# Patient Record
Sex: Female | Born: 1980 | Race: White | Hispanic: No | Smoking: Former smoker
Health system: Southern US, Community
[De-identification: ages and names within clinical notes are randomized; demographics above are authoritative.]

## PROBLEM LIST (undated history)

## (undated) DIAGNOSIS — B20 Human immunodeficiency virus [HIV] disease: Secondary | ICD-10-CM

## (undated) DIAGNOSIS — N19 Unspecified kidney failure: Secondary | ICD-10-CM

## (undated) DIAGNOSIS — N049 Nephrotic syndrome with unspecified morphologic changes: Secondary | ICD-10-CM

## (undated) DIAGNOSIS — F419 Anxiety disorder, unspecified: Secondary | ICD-10-CM

## (undated) HISTORY — PX: WISDOM TOOTH EXTRACTION: SHX21

## (undated) HISTORY — PX: HERNIA REPAIR: SHX51

## (undated) HISTORY — PX: OTHER SURGICAL HISTORY: SHX169

## (undated) HISTORY — DX: Unspecified kidney failure: N19

## (undated) HISTORY — DX: Human immunodeficiency virus (HIV) disease (CMS-HCC): B20

## (undated) MED ORDER — CABOTEGRAVIR & RILPIVIRINE ER 600 & 900 MG/3ML IM SUER
600.0000 mg | Freq: Once | INTRAMUSCULAR | Status: AC
Start: 2024-02-06 — End: 2024-02-06

## (undated) MED ORDER — BICTEGRAVIR-EMTRICITAB-TENOFOV 50-200-25 MG PO TABS
1.0000 | ORAL_TABLET | Freq: Every day | ORAL | 0 refills | Status: AC
Start: 2023-09-25 — End: ?

## (undated) MED ORDER — CABOTEGRAVIR & RILPIVIRINE ER 600 & 900 MG/3ML IM SUER
600.0000 mg | Freq: Once | INTRAMUSCULAR | Status: AC
Start: 2024-10-03 — End: 2024-10-03

## (undated) MED ORDER — ODEFSEY 200-25-25 MG PO TABS
ORAL_TABLET | ORAL | 0 refills | Status: AC
Start: 2018-02-20 — End: ?

## (undated) MED ORDER — CABOTEGRAVIR & RILPIVIRINE ER 600 & 900 MG/3ML IM SUER
600.0000 mg | Freq: Once | INTRAMUSCULAR | Status: AC
Start: 2024-06-05 — End: 2024-06-05

## (undated) MED ORDER — CABOTEGRAVIR & RILPIVIRINE ER 600 & 900 MG/3ML IM SUER
600.0000 mg | Freq: Once | INTRAMUSCULAR | Status: AC
Start: 2023-12-08 — End: 2023-12-08

## (undated) MED ORDER — ODEFSEY 200-25-25 MG PO TABS
ORAL_TABLET | ORAL | Status: AC
Start: 2015-08-25 — End: ?

## (undated) MED ORDER — CABOTEGRAVIR & RILPIVIRINE ER 600 & 900 MG/3ML IM SUER
600.0000 mg | Freq: Once | INTRAMUSCULAR | Status: AC
Start: 2024-08-04 — End: 2024-08-04

## (undated) MED ORDER — EMTRICITAB-RILPIVIR-TENOFOV AF 200-25-25 MG PO TABS
ORAL_TABLET | ORAL | 0 refills | Status: AC
Start: 2018-02-09 — End: ?

## (undated) MED ORDER — CABOTEGRAVIR & RILPIVIRINE ER 600 & 900 MG/3ML IM SUER
600.0000 mg | Freq: Once | INTRAMUSCULAR | Status: AC
Start: 2023-11-08 — End: 2023-11-08

## (undated) MED ORDER — CABOTEGRAVIR & RILPIVIRINE ER 600 & 900 MG/3ML IM SUER
600.0000 mg | Freq: Once | INTRAMUSCULAR | Status: AC
Start: 2024-04-06 — End: 2024-04-06

## (undated) MED ORDER — ODEFSEY 200-25-25 MG PO TABS
ORAL_TABLET | ORAL | Status: AC
Start: 2018-02-05 — End: ?

## (undated) MED ORDER — BICTEGRAVIR-EMTRICITAB-TENOFOV 50-200-25 MG PO TABS
1.0000 | ORAL_TABLET | Freq: Every day | ORAL | 3 refills | Status: AC
Start: 2023-09-25 — End: ?

## (undated) MED ORDER — ODEFSEY 200-25-25 MG PO TABS
ORAL_TABLET | ORAL | Status: AC
Start: 2018-01-31 — End: ?

---
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
## 2003-04-10 ENCOUNTER — Emergency Department (HOSPITAL_COMMUNITY): Admission: EM | Admit: 2003-04-10 | Discharge: 2003-04-10 | Payer: Self-pay | Admitting: Emergency Medicine

## 2003-07-25 ENCOUNTER — Other Ambulatory Visit: Admission: RE | Admit: 2003-07-25 | Discharge: 2003-07-25 | Payer: Self-pay | Admitting: Family Medicine

## 2003-08-21 ENCOUNTER — Other Ambulatory Visit: Admission: RE | Admit: 2003-08-21 | Discharge: 2003-08-21 | Payer: Self-pay | Admitting: Obstetrics and Gynecology

## 2003-11-15 ENCOUNTER — Emergency Department (HOSPITAL_COMMUNITY): Admission: EM | Admit: 2003-11-15 | Discharge: 2003-11-15 | Payer: Self-pay | Admitting: Emergency Medicine

## 2004-04-20 ENCOUNTER — Other Ambulatory Visit: Admission: RE | Admit: 2004-04-20 | Discharge: 2004-04-20 | Payer: Self-pay | Admitting: Obstetrics and Gynecology

## 2005-01-05 ENCOUNTER — Inpatient Hospital Stay (HOSPITAL_COMMUNITY): Admission: AD | Admit: 2005-01-05 | Discharge: 2005-01-09 | Payer: Self-pay | Admitting: Obstetrics and Gynecology

## 2005-02-15 ENCOUNTER — Other Ambulatory Visit: Admission: RE | Admit: 2005-02-15 | Discharge: 2005-02-15 | Payer: Self-pay | Admitting: Obstetrics and Gynecology

## 2005-08-23 ENCOUNTER — Emergency Department (HOSPITAL_COMMUNITY): Admission: EM | Admit: 2005-08-23 | Discharge: 2005-08-23 | Payer: Self-pay | Admitting: Family Medicine

## 2005-12-07 ENCOUNTER — Ambulatory Visit: Payer: Self-pay | Admitting: Family Medicine

## 2006-02-14 ENCOUNTER — Ambulatory Visit: Payer: Self-pay | Admitting: Family Medicine

## 2006-02-15 ENCOUNTER — Encounter: Admission: RE | Admit: 2006-02-15 | Discharge: 2006-02-15 | Payer: Self-pay | Admitting: Family Medicine

## 2007-04-10 ENCOUNTER — Telehealth (INDEPENDENT_AMBULATORY_CARE_PROVIDER_SITE_OTHER): Payer: Self-pay | Admitting: *Deleted

## 2007-04-19 ENCOUNTER — Telehealth (INDEPENDENT_AMBULATORY_CARE_PROVIDER_SITE_OTHER): Payer: Self-pay | Admitting: *Deleted

## 2007-08-10 ENCOUNTER — Telehealth (INDEPENDENT_AMBULATORY_CARE_PROVIDER_SITE_OTHER): Payer: Self-pay | Admitting: *Deleted

## 2007-08-14 DIAGNOSIS — F329 Major depressive disorder, single episode, unspecified: Secondary | ICD-10-CM | POA: Insufficient documentation

## 2007-08-14 DIAGNOSIS — F988 Other specified behavioral and emotional disorders with onset usually occurring in childhood and adolescence: Secondary | ICD-10-CM | POA: Insufficient documentation

## 2007-08-14 DIAGNOSIS — F3289 Other specified depressive episodes: Secondary | ICD-10-CM | POA: Insufficient documentation

## 2007-08-14 DIAGNOSIS — Z8619 Personal history of other infectious and parasitic diseases: Secondary | ICD-10-CM | POA: Insufficient documentation

## 2007-08-14 DIAGNOSIS — F419 Anxiety disorder, unspecified: Secondary | ICD-10-CM | POA: Insufficient documentation

## 2007-08-14 DIAGNOSIS — B977 Papillomavirus as the cause of diseases classified elsewhere: Secondary | ICD-10-CM

## 2007-08-15 ENCOUNTER — Ambulatory Visit: Payer: Self-pay | Admitting: Family Medicine

## 2007-08-15 DIAGNOSIS — K589 Irritable bowel syndrome without diarrhea: Secondary | ICD-10-CM | POA: Insufficient documentation

## 2007-08-20 LAB — CONVERTED CEMR LAB
Basophils Absolute: 0 10*3/uL (ref 0.0–0.1)
Eosinophils Absolute: 0.2 10*3/uL (ref 0.0–0.6)
Eosinophils Relative: 1.6 % (ref 0.0–5.0)
Glucose, Bld: 79 mg/dL (ref 70–99)
HCT: 38.7 % (ref 36.0–46.0)
Hemoglobin: 13.6 g/dL (ref 12.0–15.0)
Lymphocytes Relative: 20.1 % (ref 12.0–46.0)
Monocytes Absolute: 0.2 10*3/uL (ref 0.2–0.7)
Monocytes Relative: 1.8 % — ABNORMAL LOW (ref 3.0–11.0)
Neutro Abs: 7.7 10*3/uL (ref 1.4–7.7)
Neutrophils Relative %: 76.5 % (ref 43.0–77.0)
RDW: 12.5 % (ref 11.5–14.6)
TSH: 0.95 microintl units/mL (ref 0.35–5.50)

## 2007-09-17 ENCOUNTER — Encounter: Payer: Self-pay | Admitting: Family Medicine

## 2007-09-17 ENCOUNTER — Other Ambulatory Visit: Admission: RE | Admit: 2007-09-17 | Discharge: 2007-09-17 | Payer: Self-pay | Admitting: Family Medicine

## 2007-09-17 ENCOUNTER — Ambulatory Visit: Payer: Self-pay | Admitting: Family Medicine

## 2007-09-17 ENCOUNTER — Telehealth: Payer: Self-pay | Admitting: Family Medicine

## 2007-09-17 LAB — CONVERTED CEMR LAB
Blood in Urine, dipstick: NEGATIVE
KOH Prep: POSITIVE
Specific Gravity, Urine: >=1.03
WBC Urine, dipstick: NEGATIVE
pH: 6

## 2007-09-18 LAB — CONVERTED CEMR LAB

## 2007-09-19 ENCOUNTER — Encounter (INDEPENDENT_AMBULATORY_CARE_PROVIDER_SITE_OTHER): Payer: Self-pay | Admitting: *Deleted

## 2007-09-20 ENCOUNTER — Encounter (INDEPENDENT_AMBULATORY_CARE_PROVIDER_SITE_OTHER): Payer: Self-pay | Admitting: *Deleted

## 2007-10-24 ENCOUNTER — Telehealth: Payer: Self-pay | Admitting: Family Medicine

## 2007-11-16 ENCOUNTER — Telehealth: Payer: Self-pay | Admitting: Family Medicine

## 2007-12-10 ENCOUNTER — Telehealth: Payer: Self-pay | Admitting: Family Medicine

## 2007-12-18 ENCOUNTER — Ambulatory Visit: Payer: Self-pay | Admitting: Family Medicine

## 2007-12-18 LAB — CONVERTED CEMR LAB: KOH Prep: NEGATIVE

## 2007-12-21 LAB — CONVERTED CEMR LAB
Chlamydia, DNA Probe: NEGATIVE
GC Probe Amp, Genital: NEGATIVE

## 2008-01-03 ENCOUNTER — Telehealth (INDEPENDENT_AMBULATORY_CARE_PROVIDER_SITE_OTHER): Payer: Self-pay | Admitting: *Deleted

## 2008-02-04 ENCOUNTER — Telehealth: Payer: Self-pay | Admitting: Family Medicine

## 2008-03-26 ENCOUNTER — Telehealth: Payer: Self-pay | Admitting: Family Medicine

## 2008-04-12 ENCOUNTER — Ambulatory Visit: Payer: Self-pay | Admitting: Internal Medicine

## 2008-05-06 ENCOUNTER — Telehealth: Payer: Self-pay | Admitting: Family Medicine

## 2008-05-30 ENCOUNTER — Telehealth: Payer: Self-pay | Admitting: Family Medicine

## 2008-11-11 ENCOUNTER — Telehealth: Payer: Self-pay | Admitting: Family Medicine

## 2009-03-03 ENCOUNTER — Telehealth (INDEPENDENT_AMBULATORY_CARE_PROVIDER_SITE_OTHER): Payer: Self-pay | Admitting: *Deleted

## 2009-03-20 ENCOUNTER — Ambulatory Visit: Payer: Self-pay | Admitting: Family Medicine

## 2009-03-20 ENCOUNTER — Telehealth: Payer: Self-pay | Admitting: Family Medicine

## 2009-04-20 ENCOUNTER — Ambulatory Visit: Payer: Self-pay | Admitting: Family Medicine

## 2009-04-20 LAB — CONVERTED CEMR LAB
Bilirubin Urine: NEGATIVE
Ketones, urine, test strip: NEGATIVE
Protein, U semiquant: NEGATIVE
Specific Gravity, Urine: 1.015
Urine crystals, microscopic: 0 /hpf
Urobilinogen, UA: 0.2
pH: 5

## 2009-04-22 ENCOUNTER — Encounter: Payer: Self-pay | Admitting: Family Medicine

## 2009-06-30 ENCOUNTER — Telehealth: Payer: Self-pay | Admitting: Family Medicine

## 2009-10-09 ENCOUNTER — Telehealth: Payer: Self-pay | Admitting: Family Medicine

## 2009-10-10 ENCOUNTER — Ambulatory Visit: Payer: Self-pay | Admitting: Family Medicine

## 2009-10-10 DIAGNOSIS — K047 Periapical abscess without sinus: Secondary | ICD-10-CM | POA: Insufficient documentation

## 2009-10-14 ENCOUNTER — Telehealth: Payer: Self-pay | Admitting: Family Medicine

## 2009-11-13 ENCOUNTER — Telehealth: Payer: Self-pay | Admitting: Family Medicine

## 2009-11-18 ENCOUNTER — Telehealth: Payer: Self-pay | Admitting: Family Medicine

## 2010-01-22 ENCOUNTER — Emergency Department (HOSPITAL_COMMUNITY): Admission: EM | Admit: 2010-01-22 | Discharge: 2010-01-22 | Payer: Self-pay | Admitting: Emergency Medicine

## 2010-01-22 ENCOUNTER — Telehealth: Payer: Self-pay | Admitting: Family Medicine

## 2010-03-18 ENCOUNTER — Telehealth: Payer: Self-pay | Admitting: Family Medicine

## 2010-07-01 ENCOUNTER — Telehealth: Payer: Self-pay | Admitting: Family Medicine

## 2010-09-08 ENCOUNTER — Telehealth: Payer: Self-pay | Admitting: Family Medicine

## 2010-10-20 ENCOUNTER — Telehealth: Payer: Self-pay | Admitting: Family Medicine

## 2010-10-25 ENCOUNTER — Emergency Department (HOSPITAL_COMMUNITY): Admission: EM | Admit: 2010-10-25 | Discharge: 2010-10-25 | Payer: Self-pay | Admitting: Family Medicine

## 2010-11-02 ENCOUNTER — Encounter: Payer: Self-pay | Admitting: Family Medicine

## 2010-11-03 ENCOUNTER — Ambulatory Visit: Payer: Self-pay | Admitting: Family Medicine

## 2010-11-03 DIAGNOSIS — R1900 Intra-abdominal and pelvic swelling, mass and lump, unspecified site: Secondary | ICD-10-CM | POA: Insufficient documentation

## 2011-01-11 NOTE — Progress Notes (Signed)
Summary: Alprazolam  Phone Note Refill Request Message from:  [employer] on March 18, 2010 3:55 PM  Refills Requested: Medication #1:  ALPRAZOLAM 0.5 MG  TABS 1 by mouth once daily [facility] [address] [phone]*   Last Lenox Ahr Date:  02/01/2010   Pharmacy Phone:  [phone]   Method Requested: Telephone to Pharmacy Initial call taken by: Delilah Shan CMA Duncan Dull),  March 18, 2010 3:56 PM  Follow-up for Phone Call        px written on EMR for call in  Follow-up by: Judith Part MD,  March 18, 2010 4:05 PM    New/Updated Medications: ALPRAZOLAM 0.5 MG  TABS (ALPRAZOLAM) 1 by mouth once daily Prescriptions: ALPRAZOLAM 0.5 MG  TABS (ALPRAZOLAM) 1 by mouth once daily  #90 x 0   Entered and Authorized by:   Judith Part MD   Signed by:   Delilah Shan CMA (AAMA) on 03/18/2010   Method used:   Telephoned to ...         RxID:   [id number]   Appended Document: Alprazolam Medication phoned to pharmacy.

## 2011-01-11 NOTE — Progress Notes (Signed)
Summary: Alprazolam 0.5mg  refill  Phone Note Refill Request Call back at [phone] Message from:  [facility] [address] on July 01, 2010 4:05 PM  Refills Requested: Medication #1:  ALPRAZOLAM 0.5 MG  TABS 1 by mouth once daily [facility] [address] electronically request refill for Alprazolam 0.5mg . Pt request #90 and last refill date was 03/18/10. Please advise.    Method Requested: Telephone to Pharmacy Initial call taken by: Lewanda Rife LPN,  July 01, 2010 4:07 PM  Follow-up for Phone Call        px written on EMR for call in  Follow-up by: Judith Part MD,  July 01, 2010 7:15 PM  Additional Follow-up for Phone Call Additional follow up Details #1::        [facility] [address] [facility]. Lewanda Rife LPN  July 02, 2010 9:55 AM     New/Updated Medications: ALPRAZOLAM 0.5 MG  TABS (ALPRAZOLAM) 1 by mouth once daily as needed Prescriptions: ALPRAZOLAM 0.5 MG  TABS (ALPRAZOLAM) 1 by mouth once daily as needed  #90 x 0   Entered and Authorized by:   Judith Part MD   Signed by:   Lewanda Rife LPN on 45/40/9811   Method used:   Telephoned to ...         RxID:   [id number]

## 2011-01-11 NOTE — Progress Notes (Signed)
Summary: fell and hit her head.  Phone Note Call from Patient   Caller: Patient Call For: Judith Part MD Summary of Call: Pt states she fell and hit her head yesterday. This morning she has a bad headache and vomiting.  Per Dr. Milinda Antis, advised pt to go to ER, she might need a CT. Initial call taken by: Lowella Petties CMA,  January 22, 2010 9:14 AM  Follow-up for Phone Call        agree with above  Follow-up by: Judith Part MD,  January 22, 2010 10:29 AM

## 2011-01-11 NOTE — Progress Notes (Signed)
Summary: refill request for remeron  Phone Note Refill Request Message from:  Patient  Refills Requested: Medication #1:  REMERON 15 MG  TABS 1 by mouth at bedtime Phoned request from pt, [facility] to [facility].  Initial call taken by: Lowella Petties CMA,  September 08, 2010 3:08 PM  Follow-up for Phone Call        schedule f/u in late fall or winter px written on EMR for call in  Follow-up by: Judith Part MD,  September 08, 2010 3:14 PM  Additional Follow-up for Phone Call Additional follow up Details #1::        Medication phoned to [facility] pharmacy as instructed. Patient notified as instructed by telephone. Pt said would call back when cks work schedule to schedule her appt.Lewanda Rife LPN  September 08, 2010 4:07 PM     New/Updated Medications: REMERON 15 MG TABS (MIRTAZAPINE) 1 by mouth at bedtime Prescriptions: REMERON 15 MG TABS (MIRTAZAPINE) 1 by mouth at bedtime  #30 x 3   Entered and Authorized by:   Judith Part MD   Signed by:   Lewanda Rife LPN on 16/09/9603   Method used:   Telephoned to ...       [facility] [address] [phone]* (retail)       [address]       [place], [place]  [address]       Ph: [phone]       Fax: [phone]   RxID:   [id number]

## 2011-01-11 NOTE — Miscellaneous (Signed)
Summary: Controlled Substance Agreement  Controlled Substance Agreement   Imported By: Lanelle Bal 11/11/2010 12:38:26  _____________________________________________________________________  External Attachment:    Type:   Image     Comment:   External Document

## 2011-01-11 NOTE — Progress Notes (Signed)
Summary: Alprazolam 0.5mg   Phone Note Refill Request Call back at [phone] Message from:  Patient on October 20, 2010 4:40 PM  Refills Requested: Medication #1:  ALPRAZOLAM 0.5 MG  TABS 1 by mouth once daily as needed Pt called to ck on refill she said she requested last week. I was not able to see request from last week so I told pt I would put in request for her and apologized for the inconvenience. pt said she is out of medication. Pt scheduled appt to see Dr Milinda Antis on 11/03/10 at 11:30am.Pt uses [facility] [address] as pharmacy and pt can be contacted at [phone]. Pt understands she may get response tomorrow due to the time of day. Please advise.    Method Requested: Telephone to Pharmacy Initial call taken by: Lewanda Rife LPN,  October 20, 2010 4:43 PM  Follow-up for Phone Call        I also do not see a request from last wk px written on EMR for call in  Follow-up by: Judith Part MD,  October 20, 2010 5:00 PM  Additional Follow-up for Phone Call Additional follow up Details #1::        Medication phoned to [facility] pharmacy as instructed. Patient notified as instructed by telephone. Lewanda Rife LPN  October 20, 2010 5:24 PM     Prescriptions: ALPRAZOLAM 0.5 MG  TABS (ALPRAZOLAM) 1 by mouth once daily as needed  #90 x 0   Entered and Authorized by:   Judith Part MD   Signed by:   Lewanda Rife LPN on 45/40/9811   Method used:   Telephoned to ...       [facility] (retail)       [address]       [place], [place]  [address]       Ph: [phone]       Fax: [phone]   RxID:   [id number]
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
## 2011-01-19 ENCOUNTER — Encounter: Payer: Self-pay | Admitting: Family Medicine

## 2011-01-19 ENCOUNTER — Ambulatory Visit: Payer: BC Managed Care – PPO | Admitting: Family Medicine

## 2011-01-19 DIAGNOSIS — M542 Cervicalgia: Secondary | ICD-10-CM

## 2011-01-19 DIAGNOSIS — F17200 Nicotine dependence, unspecified, uncomplicated: Secondary | ICD-10-CM

## 2011-01-19 DIAGNOSIS — S139XXA Sprain of joints and ligaments of unspecified parts of neck, initial encounter: Secondary | ICD-10-CM

## 2011-01-20 ENCOUNTER — Encounter: Payer: Self-pay | Admitting: Family Medicine

## 2011-01-27 ENCOUNTER — Telehealth: Payer: Self-pay | Admitting: Family Medicine
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
## 2011-02-02 NOTE — Progress Notes (Signed)
Summary: Alprazolam 0.5mg   Phone Note Refill Request Call back at [phone] Message from:  Christine Rice on January 27, 2011 11:04 AM  Refills Requested: Medication #1:  ALPRAZOLAM 0.5 MG  TABS 1 by mouth once daily as needed   Last Refilled: 10/20/2010 [facility] [address] electronically request refil on Alprazolam 0.5mg . Lat refill date 10/20/10.Please advise.    Method Requested: Telephone to Pharmacy Initial call taken by: Lewanda Rife LPN,  January 27, 2011 11:05 AM  Follow-up for Phone Call        px written on EMR for call in  Follow-up by: Judith Part MD,  January 27, 2011 1:06 PM  Additional Follow-up for Phone Call Additional follow up Details #1::        Rx called in as directed.  Additional Follow-up by: Janee Morn CMA Duncan Dull),  January 27, 2011 4:34 PM    New/Updated Medications: ALPRAZOLAM 0.5 MG  TABS (ALPRAZOLAM) 1 by mouth once daily as needed Prescriptions: ALPRAZOLAM 0.5 MG  TABS (ALPRAZOLAM) 1 by mouth once daily as needed  #90 x 0   Entered and Authorized by:   Judith Part MD   Signed by:   Judith Part MD on 01/27/2011   Method used:   Telephoned to ...       [facility] (retail)       [address]       [place], [place]  [address]       Ph: [phone]       Fax: [phone]   RxID:   [id number]

## 2011-04-19 ENCOUNTER — Telehealth: Payer: Self-pay | Admitting: *Deleted

## 2011-04-19 NOTE — Telephone Encounter (Signed)
Spoke with patient. She said she can actually see the protrusion over her right pelvic bone. The pain is actually to the right of her navel area. She wants to know if you even think the pain could be from the hernia or from something else entirely? 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 She did say pain gets worse the more she is on her feet or more active and sometimes it feels like it is"vibrating". She denies any fever, dizziness, n/v, diarrhea or bleeding. She said we referred her to [facility], but I couldn't find the note. She said if we returned call before 2:30 tomorrow to leave detailed message on phone.

## 2011-04-19 NOTE — Telephone Encounter (Signed)
With pain and visible protrusion I would recommend seeing the surgeon again for re check - could be the hernia  If severe pain at any time- go to ER  She can make own appt but alert me if she needs referral Avoid heavy lifting or straining

## 2011-04-19 NOTE — Telephone Encounter (Signed)
Patient notified as instructed by telephone. 

## 2011-04-19 NOTE — Telephone Encounter (Signed)
?   What kind of hernia??- in groin or stomach  Any bulging?  Hiatal hernia ? -the kind that causes acid reflux ? Who was the  Surgeon -- perhaps we can get the note

## 2011-04-19 NOTE — Telephone Encounter (Signed)
Patient says that she was diagnosed with a hernia some time ago and she went to see a Careers adviser. She says that the surgeon told her that she didn't need to worry about having the surgery unless she started to have pain from it. She says that up until now she hasn't had any pain, but the last couple of days she is having pain in her stomach, but she is not sure if it is from the hernia or if something else is causing it. She says that her copay is outrageous to see the surgeon, so she is asking if it would be beneficial for her to come here first just in case it is not from the hernia.

## 2011-04-29 NOTE — Assessment & Plan Note (Signed)
[facility]                                 ON-CALL NOTE   NAME:BLACKRebbie, Lauricella                        MRN:          [id number]  DATE:04/08/2007                            DOB:          1981/04/18    I think she is a [facility] patient.   Phone#:  [phone]   The patient was told that her medication Xanax was called in on Friday,  2 days ago; however, when she went to the pharmacy to day, they said  they did not have this, which is a Engineer, structural.  It is for Xanax which she  takes only occasionally,  a maximum of twice a week, but she is during  finals, and she has been on this before.  I told her we do not usually  do this, but because it sounds like there is some kind of  miscommunication between the office and her pharmacy, we have called in  Xanax 0.25 mg, #14, 1 p.o. b.i.d. p.r.n. with no refills.  The pharmacy  number is [phone].     Neta Mends. Panosh, MD  Electronically Signed    WKP/MedQ  DD: 04/08/2007  DT: 04/08/2007  Job #: [id number]

## 2011-04-29 NOTE — Op Note (Signed)
Christine Rice, Christine Rice               ACCOUNT NO.:  [id number]   MEDICAL RECORD NO.:  [id number]          PATIENT TYPE:  INP   LOCATION:  9127                          FACILITY:  [facility]   PHYSICIAN:  Freddy Finner, M.D.   DATE OF BIRTH:  1981/10/22   DATE OF PROCEDURE:  01/06/2005  DATE OF DISCHARGE:                                 OPERATIVE REPORT   PREOPERATIVE DIAGNOSES:  Intrauterine pregnancy at term, failure to progress  in labor secondary to cephalopelvic disproportion.   POSTOPERATIVE DIAGNOSES:  Intrauterine pregnancy at term, failure to  progress in labor secondary to cephalopelvic disproportion.   OPERATION:  Primary low transverse cervical cesarean section, delivery of  viable female infant, Apgar's of 9 and 9 by NICU team in attendance.  Arterial cord pH 7.31.   SURGEON:  Freddy Finner, M.D.   ANESTHESIA:  Epidural.   ESTIMATED BLOOD LOSS:  800 mL.   COMPLICATIONS:  None.   The patient is a 30 year old, primigravida who was followed through labor  and had a second stage of three hours with no significant desat.  She was  brought to the operating room where an epidural was dosed for surgery. She  was placed in dorsal lithotomy position, the abdomen was prepped and draped  in the usual fashion, Foley catheter was already in place. A lower abdominal  transverse incision was made and carried sharply down the fascia which was  entered sharply and extended to the extent of the skin incision. The rectus  sheath was developed superiorly and inferiorly with blunt and sharp  dissection.  The rectus muscles were divided in the midline. The peritoneum  was elevated and entered sharply and extended to the extent of the skin  incision.  A bladder blade was placed. A transverse incision was made in the  visceroperitoneum overlying the lower segment and the bladder bluntly  dissected off the lower uterine segment. A transverse incision was made in  the lower uterine segment and  extended bluntly.  A viable female infant was  then delivered using a kiwi vacuum extractor with a small incision in the  right rectus muscle for adequate room to deliver the infant. Apgar's and  cord pH were noted above.  There were two loops of loose nuchal cord which  were reduced prior to the delivery of the shoulders. The placenta and all  the parts of conception were removed from the uterus after collection of  routine blood from the cord and arterial cord gas.  The uterus was delivered  onto the anterior wall, wrapped in a moist towel. The tubes and ovaries and  uterus were considered to be normal. The uterine incision was then closed in  a double layer with running locking #0 Monocryl and an imbricating suture of  #0 Monocryl.  The bladder flap was reapproximated with interrupted #0  Monocryl suture.  The uterus was delivered back into the abdominal cavity,  irrigation was carried out, hemostasis was complete.  Pack, needle and  instrument counts were correct.  Abdominal incision was closed in layers  with running #0 Monocryl  to close the peritoneum, reapproximate the rectus  muscles and close the defect in the right rectus  muscle. The fascia was closed with running #0 PDS, subcu was closed with  running 2-0 plain. The skin was closed with __________ skin staples and 1/4  inch Steri-Strips.  The patient tolerated the procedure well and was taken  to the recovery room in good condition.      WRN/MEDQ  D:  01/06/2005  T:  01/06/2005  Job:  [id number]

## 2011-04-29 NOTE — Discharge Summary (Signed)
Christine Rice, Christine Rice               ACCOUNT NO.:  [id number]   MEDICAL RECORD NO.:  [id number]          PATIENT TYPE:  INP   LOCATION:  9127                          FACILITY:  [facility]   PHYSICIAN:  Dineen Kid. Rana Snare, M.D.    DATE OF BIRTH:  [birth year]   DATE OF ADMISSION:  01/05/2005  DATE OF DISCHARGE:  01/09/2005                                 DISCHARGE SUMMARY   ADMITTING DIAGNOSES:  1.  Intrauterine pregnancy at term.  2.  Spontaneous rupture of membranes in early labor.   DISCHARGE DIAGNOSES:  1.  Status post low transverse cesarean section secondary to cephalopelvic      disproportion and failure to progress.  2.  Viable female infant.   PROCEDURE:  Primary low transverse cesarean section.   REASON FOR ADMISSION:  Please see written H&P.   HOSPITAL COURSE:  The patient is a 30 year old gravida 2 para 1 that  presented to [facility] with spontaneous rupture of  membranes in early labor. Pregnancy had been uncomplicated. On admission,  vital signs were stable, blood pressure 130/72. Cervix was dilated to 3 cm,  50% effaced, vertex presentation. Amniotic fluid was noted to be clear. 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 There was  no evidence of current HSV infection. Decision was made to augment her labor  with Pitocin. The patient did progress to full dilatation. Fetal heart tones  had remained stable. However, after pushing for 3 hours without descent  beyond a +1 to +2 station, decision was made to proceed with a low  transverse cesarean section. The patient was then transferred to the  operating room where epidural was dosed to an adequate surgical level. A low  transverse incision was made with the delivery of a viable female infant  weighing 8 pounds 1 ounce with Apgars of 9 at one minute and 9 at five  minutes. Arterial cord pH was 7.31. The patient tolerated the procedure well  and was taken to the recovery room in stable  condition. On postoperative day  #1, the patient was without complaint. Vital signs were stable, fundus was  firm and nontender. Abdominal dressing was noted to be clean, dry, and  intact. Laboratories revealed hemoglobin of 9.8; platelet count 139,000; wbc  count of 11.5. Postoperative day #2, the patient was without complaint.  Vital signs remained stable, fundus was firm and nontender. Incision was  clean, dry, and intact. She was ambulating well and tolerating a regular  diet without complaints of nausea and vomiting. Postoperative day #3, the  patient was without complaint. Vital signs remained stable. Fundus was firm  and nontender. Incision was clean, dry, and intact. Staples were removed and  the patient was discharged home.   CONDITION ON DISCHARGE:  Good.   DIET:  Regular as tolerated.   ACTIVITY:  No heavy lifting, no driving x2 weeks, no vaginal entry.   FOLLOW-UP:  The patient is to follow up in the office in 1 week for an  incision check. She is to call for temperature greater than 100 degrees,  persistent nausea and vomiting, heavy vaginal bleeding, and/or redness or  drainage from the incisional site.   DISCHARGE MEDICATIONS:  1.  Tylox #30 one p.o. q.4-6h. p.r.n.  2.  Motrin 600 mg q.6h.  3.  Prenatal vitamins one p.o. daily.  4.  Colace one p.o. daily p.r.n.      CC/MEDQ  D:  02/07/2005  T:  02/07/2005  Job:  [id number]

## 2011-05-18 ENCOUNTER — Other Ambulatory Visit: Payer: Self-pay | Admitting: *Deleted

## 2011-05-18 MED ORDER — ALPRAZOLAM 0.5 MG PO TABS: ORAL_TABLET | ORAL | Status: DC

## 2011-05-18 NOTE — Telephone Encounter (Signed)
Medication phoned to  [facility] [address] [phone] pharmacy as instructed. Patient notified as instructed by telephone.

## 2011-05-18 NOTE — Telephone Encounter (Signed)
Px written for call in   

## 2011-06-24 LAB — ABO/RH: RH Type: POSITIVE

## 2011-06-24 LAB — HIV ANTIBODY (ROUTINE TESTING W REFLEX): HIV: NONREACTIVE

## 2011-06-24 LAB — RPR: RPR: NONREACTIVE

## 2011-10-28 ENCOUNTER — Telehealth (INDEPENDENT_AMBULATORY_CARE_PROVIDER_SITE_OTHER): Payer: Self-pay | Admitting: General Surgery

## 2011-11-08 ENCOUNTER — Encounter (INDEPENDENT_AMBULATORY_CARE_PROVIDER_SITE_OTHER): Payer: Self-pay | Admitting: Surgery

## 2011-11-08 ENCOUNTER — Ambulatory Visit (INDEPENDENT_AMBULATORY_CARE_PROVIDER_SITE_OTHER): Payer: BC Managed Care – PPO | Admitting: Surgery

## 2011-11-08 VITALS — BP 122/78 | HR 60 | Temp 97.2°F | Resp 16 | Ht 66.0 in | Wt 167.0 lb

## 2011-11-08 DIAGNOSIS — K429 Umbilical hernia without obstruction or gangrene: Secondary | ICD-10-CM

## 2011-11-08 DIAGNOSIS — K4020 Bilateral inguinal hernia, without obstruction or gangrene, not specified as recurrent: Secondary | ICD-10-CM

## 2011-11-08 NOTE — Progress Notes (Addendum)
Subjective:     Patient ID: Christine Rice, female   DOB: 03/23/1981, 30 y.o.   MRN: [id number]  HPI  Christine Rice  1981/03/13 [id number]  Patient Care Team: Roxy Manns, MD as PCP - General Turner Daniels, MD as Attending Physician (Obstetrics and Gynecology)  This patient is a 30 y.o.female who presents today for surgical evaluation at the request of Dr. Rana Snare.   This is a pleasant female who noticed a bulge in her right groin in 2011. She was seen by my partner Dr. Almond Lint. Dr. Donell Beers recommended elective right inguinal hernia repair. The patient was hoping to delay until the time of a planned C-section for a future pregnancy.   She is now going into the third trimester of her second pregnancy.  She is having a planned C-section on Valentine's Day., 26 January 2012.  She comes in today to consider elective surgery. She was hoping that the hernia repair could be done at the same time as the C-section. She could not remember Dr. Donell Beers and so was placed in my clinic. She is happy to have me to be her Careers adviser.  No changes in her pregnancy  History reviewed. No pertinent past medical history.  Past Surgical History  Procedure Date  . Cesarean section 01/06/05    History   Social History  . Marital Status: Legally Separated    Spouse Name: N/A    Number of Children: N/A  . Years of Education: N/A   Occupational History  . Not on file.   Social History Main Topics  . Smoking status: Current Everyday Smoker -- 0.2 packs/day  . Smokeless tobacco: Never Used  . Alcohol Use: No  . Drug Use: No  . Sexually Active:    Other Topics Concern  . Not on file   Social History Narrative  . No narrative on file    History reviewed. No pertinent family history.  Current outpatient prescriptions:PRENATAL VITAMINS PO, Take by mouth daily.  , Disp: , Rfl:   Allergies  Allergen Reactions  . Amoxicillin     REACTION: rash    BP 122/78  Pulse 60  Temp(Src) 97.2 F (36.2  C) (Temporal)  Resp 16  Ht 5\' 6"  (1.676 m)  Wt 167 lb (75.751 kg)  BMI 26.95 kg/m2    Review of Systems  Constitutional: Negative for fever, chills, diaphoresis, appetite change and fatigue.  HENT: Negative for ear pain, sore throat, trouble swallowing, neck pain and ear discharge.   Eyes: Negative for photophobia, discharge and visual disturbance.  Respiratory: Positive for cough. Negative for choking, chest tightness, shortness of breath, wheezing and stridor.   Cardiovascular: Negative for chest pain, palpitations and leg swelling.  Gastrointestinal: Positive for abdominal pain. Negative for nausea, vomiting, diarrhea, constipation, blood in stool, anal bleeding and rectal pain.  Genitourinary: Negative for dysuria, frequency and difficulty urinating.  Musculoskeletal: Negative for myalgias and gait problem.  Skin: Negative for color change, pallor and rash.  Neurological: Negative for dizziness, speech difficulty, weakness and numbness.  Hematological: Negative for adenopathy.  Psychiatric/Behavioral: Negative for confusion and agitation. The patient is not nervous/anxious.        Objective:   Physical Exam  Constitutional: She is oriented to person, place, and time. She appears well-developed and well-nourished. No distress.  HENT:  Head: Normocephalic.  Mouth/Throat: Oropharynx is clear and moist. No oropharyngeal exudate.  Eyes: Conjunctivae and EOM are normal. Pupils are equal, round, and reactive to light. No scleral  icterus.  Neck: Normal range of motion. Neck supple. No tracheal deviation present.  Cardiovascular: Normal rate, regular rhythm and intact distal pulses.   Pulmonary/Chest: Effort normal and breath sounds normal. No respiratory distress. She has no wheezes. She has no rales. She exhibits no tenderness.  Abdominal: She exhibits no distension. There is no tenderness. There is no rebound and no guarding. Hernia confirmed negative in the right inguinal area and  confirmed negative in the left inguinal area.       Gravid c/w pregnancy.  Small irreducible umb hernia - very sensitive  Genitourinary: No vaginal discharge found.       Small R>L lateral inguinal hernias  Musculoskeletal: Normal range of motion. She exhibits no tenderness.  Lymphadenopathy:    She has no cervical adenopathy.       Right: No inguinal adenopathy present.       Left: No inguinal adenopathy present.  Neurological: She is alert and oriented to person, place, and time. No cranial nerve deficit. She exhibits normal muscle tone. Coordination normal.  Skin: Skin is warm and dry. No rash noted. She is not diaphoretic. No erythema.  Psychiatric: She has a normal mood and affect. Her behavior is normal. Judgment and thought content normal.       Assessment:     BIH, symptomatic umb herniae.  Requests repairs at time of planned C/S at 07Feb2013    Plan:     The anatomy & physiology of the abdominal wall was discussed.  The pathophysiology of hernias was discussed.  Natural history risks without surgery of enlargement, pain, incarceration & strangulation was discussed.   Contributors to complications such as smoking, obesity, diabetes, prior surgery, etc were discussed.  I feel the risks of no intervention will lead to serious problems that outweigh the operative risks; therefore, I recommended surgery to reduce and repair the hernia.  I techniques with need for an open approach.  I noted the probable use of mesh to patch and/or buttress hernia repair.    I think I will be able to do a modified Stoppa underlay repair with Ultrapro 15x15cm mesh for each side, reproducing a lap TAPP approach.  Getting to the bellybutton may be more challenging, but I will try to avoid another incision per her request.  That may just need primary repair.  Risks such as bleeding, infection, abscess, need for further treatment, heart attack, death, and other risks were discussed.  I noted a good likelihood  this will help address the problem.   Goals of post-operative recovery were discussed as well.  Possibility that this will not correct all symptoms was explained.  I stressed the importance of low-impact activity, aggressive pain control, avoiding constipation, & not pushing through pain to minimize risk of post-operative chronic pain or injury. Possibility of reherniation was discussed.  We will work to minimize complications.   An educational handout further explaining the pathology & treatment options was given as well.  Questions were answered.  The patient expresses understanding & wishes to proceed with surgery.  I have re-reviewed the the patient's records, history, medications, and allergies.  I have re-examined the patient.  I again discussed intraoperative plans and goals of post-operative recovery.  The patient agrees to proceed.

## 2011-11-08 NOTE — Patient Instructions (Signed)
Hernia °A hernia occurs when an internal organ pushes out through a weak spot in the abdominal wall. Hernias most commonly occur in the groin and around the navel. Hernias often can be pushed back into place (reduced). Most hernias tend to get worse over time. Some abdominal hernias can get stuck in the opening (irreducible or incarcerated hernia) and cannot be reduced. An irreducible abdominal hernia which is tightly squeezed into the opening is at risk for impaired blood supply (strangulated hernia). A strangulated hernia is a medical emergency. Because of the risk for an irreducible or strangulated hernia, surgery may be recommended to repair a hernia. °CAUSES  °· Heavy lifting.  °· Prolonged coughing.  °· Straining to have a bowel movement.  °· A cut (incision) made during an abdominal surgery.  °HOME CARE INSTRUCTIONS  °· Bed rest is not required. You may continue your normal activities.  °· Avoid lifting more than 10 pounds (4.5 kg) or straining.  °· Cough gently. If you are a smoker it is best to stop. Even the best hernia repair can break down with the continual strain of coughing. Even if you do not have your hernia repaired, a cough will continue to aggravate the problem.  °· Do not wear anything tight over your hernia. Do not try to keep it in with an outside bandage or truss. These can damage abdominal contents if they are trapped within the hernia sac.  °· Eat a normal diet.  °· Avoid constipation. Straining over long periods of time will increase hernia size and encourage breakdown of repairs. If you cannot do this with diet alone, stool softeners may be used.  °SEEK IMMEDIATE MEDICAL CARE IF:  °· You have a fever.  °· You develop increasing abdominal pain.  °· You feel nauseous or vomit.  °· Your hernia is stuck outside the abdomen, looks discolored, feels hard, or is tender.  °· You have any changes in your bowel habits or in the hernia that are unusual for you.  °· You have increased pain or  swelling around the hernia.  °· You cannot push the hernia back in place by applying gentle pressure while lying down.  °MAKE SURE YOU:  °· Understand these instructions.  °· Will watch your condition.  °· Will get help right away if you are not doing well or get worse.  °Document Released: 11/28/2005 Document Revised: 08/10/2011 Document Reviewed: 07/17/2008 °Exit[employer]® Patient Information ©2012 [employer]. °

## 2012-01-09 ENCOUNTER — Encounter (HOSPITAL_COMMUNITY): Payer: Self-pay | Admitting: Pharmacist

## 2012-01-17 ENCOUNTER — Encounter (HOSPITAL_COMMUNITY): Payer: Self-pay

## 2012-01-18 ENCOUNTER — Encounter (HOSPITAL_COMMUNITY): Payer: Self-pay

## 2012-01-18 ENCOUNTER — Encounter (HOSPITAL_COMMUNITY)
Admission: RE | Admit: 2012-01-18 | Discharge: 2012-01-18 | Disposition: A | Discharge status: Home / Self Care | Payer: BC Managed Care – PPO | Source: Ambulatory Visit | Attending: Obstetrics and Gynecology | Admitting: Obstetrics and Gynecology

## 2012-01-18 HISTORY — DX: Anxiety disorder, unspecified: F41.9

## 2012-01-18 LAB — CBC
HCT: 37.5 % (ref 36.0–46.0)
MCHC: 33.1 g/dL (ref 30.0–36.0)
MCV: 91.2 fL (ref 78.0–100.0)
Platelets: 200 10*3/uL (ref 150–400)
RDW: 13.6 % (ref 11.5–15.5)
WBC: 13 10*3/uL — ABNORMAL HIGH (ref 4.0–10.5)

## 2012-01-18 NOTE — Patient Instructions (Addendum)
01/18/1319 Christine Rice  01/18/2012   Your procedure is scheduled on:  01/19/12  Enter through the Main Entrance of [facility] at 1130 AM.  Pick up the phone at the desk and dial [phone].   Call this number if you have problems the morning of surgery: [phone]   Remember:   Do not eat food:After Midnight.  Do not drink clear liquids: 4 Hours before arrival.  Take these medicines the morning of surgery with A SIP OF WATER: NA   Do not wear jewelry, make-up or nail polish.  Do not wear lotions, powders, or perfumes. You may wear deodorant.  Do not shave 48 hours prior to surgery.  Do not bring valuables to the hospital.  Contacts, dentures or bridgework may not be worn into surgery.  Leave suitcase in the car. After surgery it may be brought to your room.  For patients admitted to the hospital, checkout time is 11:00 AM the day of discharge.   Patients discharged the day of surgery will not be allowed to drive home.  Name and phone number of your driver: NA  Special Instructions: CHG Shower Use Special Wash: 1/2 bottle night before surgery and 1/2 bottle morning of surgery.   Please read over the following fact sheets that you were given: MRSA Information

## 2012-01-18 NOTE — H&P (Addendum)
Christine Rice is a 31 y.o. female presenting for repeat cesarean section and repair of inguinal hernias.  Preg has been uncomplicated except for the hernias.  She now is having labor sxs, cervical dilation and presents for  Repeat cesarean.  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  Has been evaluated and followed by Dr Michaell Cowing (general surgery) who is going to repair her hernia at the same time.  GBS negative.   History OB History    Grav Para Term Preterm Abortions TAB SAB Ect Mult Living   3 1   1 1    1      Past Medical History  Diagnosis Date  . Anxiety    Past Surgical History  Procedure Date  . Cesarean section 01/06/05   Family History: family history is not on file. Social History:  reports that she has been smoking.  She has never used smokeless tobacco. She reports that she does not drink alcohol or use illicit drugs.  ROS    Last menstrual period 04/24/2011. Exam Physical Exam   Bil inguinal hernia R>L Cx 2/75/-3 Prenatal labs: ABO, Rh: B/Positive/-- (07/13 1019) Antibody:   Rubella: Immune (07/13 1019) RPR: Nonreactive (07/13 1019)  HBsAg: Negative (07/13 1019)  HIV: Non-reactive (07/13 1019)  GBS:     Assessment/Plan: IUP at term with early labor sxs and cervical change desiring repeat C/S Plan repeat LSTCS Hernias-Dr Gross to repair in combined procedure   Datha Kissinger C 01/18/2012, 9:14 PM  Risks and benefits of C/S were discussed.  All questions were answered and informed consent was obtained.  Plan to proceed with low segment transverse Cesarean Section. This patient has been seen and examined.   All of her questions were answered.  Labs and vital signs reviewed.  Informed consent has been obtained.  The History and Physical is current. This patient has been seen and examined. 01/19/12 1300 DL

## 2012-01-19 ENCOUNTER — Inpatient Hospital Stay (HOSPITAL_COMMUNITY)
Admission: RE | Admit: 2012-01-19 | Discharge: 2012-01-21 | DRG: 371 | Disposition: A | Discharge status: Home / Self Care | Payer: BC Managed Care – PPO | Source: Ambulatory Visit | Attending: Obstetrics and Gynecology | Admitting: Obstetrics and Gynecology

## 2012-01-19 ENCOUNTER — Encounter (HOSPITAL_COMMUNITY)
Admission: RE | Disposition: A | Discharge status: Home / Self Care | Payer: Self-pay | Source: Ambulatory Visit | Attending: Obstetrics and Gynecology

## 2012-01-19 ENCOUNTER — Encounter (HOSPITAL_COMMUNITY): Payer: Self-pay | Admitting: *Deleted

## 2012-01-19 ENCOUNTER — Inpatient Hospital Stay (HOSPITAL_COMMUNITY): Payer: BC Managed Care – PPO | Admitting: Anesthesiology

## 2012-01-19 ENCOUNTER — Encounter (HOSPITAL_COMMUNITY): Payer: Self-pay | Admitting: Anesthesiology

## 2012-01-19 DIAGNOSIS — K4020 Bilateral inguinal hernia, without obstruction or gangrene, not specified as recurrent: Secondary | ICD-10-CM

## 2012-01-19 DIAGNOSIS — K429 Umbilical hernia without obstruction or gangrene: Secondary | ICD-10-CM

## 2012-01-19 DIAGNOSIS — O99892 Other specified diseases and conditions complicating childbirth: Secondary | ICD-10-CM | POA: Diagnosis present

## 2012-01-19 DIAGNOSIS — O34219 Maternal care for unspecified type scar from previous cesarean delivery: Principal | ICD-10-CM | POA: Diagnosis present

## 2012-01-19 HISTORY — PX: INGUINAL HERNIA REPAIR: SHX194

## 2012-01-19 HISTORY — PX: UMBILICAL HERNIA REPAIR: SHX196

## 2012-01-19 SURGERY — Surgical Case
Anesthesia: Epidural | Site: Abdomen | Wound class: Clean Contaminated

## 2012-01-19 MED ORDER — MEPERIDINE HCL 25 MG/ML IJ SOLN: 6.2500 mg | INTRAMUSCULAR | Status: DC | PRN

## 2012-01-19 MED ORDER — OXYTOCIN 20 UNITS IN LACTATED RINGERS INFUSION - SIMPLE: 125.0000 mL/h | INTRAVENOUS | Status: AC

## 2012-01-19 MED ORDER — ONDANSETRON HCL 4 MG/2ML IJ SOLN
4.0000 mg | Freq: Three times a day (TID) | INTRAMUSCULAR | Status: DC | PRN
  Filled 2012-01-19: qty 2

## 2012-01-19 MED ORDER — KETOROLAC TROMETHAMINE 30 MG/ML IJ SOLN: 30.0000 mg | Freq: Four times a day (QID) | INTRAMUSCULAR | Status: AC | PRN

## 2012-01-19 MED ORDER — EPHEDRINE SULFATE 50 MG/ML IJ SOLN
INTRAMUSCULAR | Status: DC | PRN
  Administered 2012-01-19 (×2): 10 mg via INTRAVENOUS

## 2012-01-19 MED ORDER — PROMETHAZINE HCL 25 MG/ML IJ SOLN: 6.2500 mg | INTRAMUSCULAR | Status: DC | PRN

## 2012-01-19 MED ORDER — DIPHENHYDRAMINE HCL 25 MG PO CAPS: 25.0000 mg | ORAL_CAPSULE | ORAL | Status: DC | PRN

## 2012-01-19 MED ORDER — SODIUM BICARBONATE 8.4 % IV SOLN
INTRAVENOUS | Status: AC
  Filled 2012-01-19: qty 50

## 2012-01-19 MED ORDER — KETOROLAC TROMETHAMINE 30 MG/ML IJ SOLN
15.0000 mg | Freq: Once | INTRAMUSCULAR | Status: AC | PRN
  Administered 2012-01-19: 30 mg via INTRAVENOUS

## 2012-01-19 MED ORDER — DIPHENHYDRAMINE HCL 50 MG/ML IJ SOLN: 12.5000 mg | INTRAMUSCULAR | Status: DC | PRN

## 2012-01-19 MED ORDER — BUPIVACAINE-EPINEPHRINE 0.25% -1:200000 IJ SOLN
INTRAMUSCULAR | Status: DC | PRN
  Administered 2012-01-19: 20 mL

## 2012-01-19 MED ORDER — IBUPROFEN 600 MG PO TABS: 600.0000 mg | ORAL_TABLET | Freq: Four times a day (QID) | ORAL | Status: DC | PRN

## 2012-01-19 MED ORDER — ACETAMINOPHEN 325 MG PO TABS: 325.0000 mg | ORAL_TABLET | ORAL | Status: DC | PRN

## 2012-01-19 MED ORDER — ACETAMINOPHEN 10 MG/ML IV SOLN: 1000.0000 mg | Freq: Four times a day (QID) | INTRAVENOUS | Status: AC | PRN

## 2012-01-19 MED ORDER — NALBUPHINE HCL 10 MG/ML IJ SOLN: 5.0000 mg | INTRAMUSCULAR | Status: DC | PRN

## 2012-01-19 MED ORDER — PRENATAL MULTIVITAMIN CH
1.0000 | ORAL_TABLET | Freq: Every day | ORAL | Status: DC
  Administered 2012-01-20 – 2012-01-21 (×2): 1 via ORAL
  Filled 2012-01-19 (×2): qty 1

## 2012-01-19 MED ORDER — LANOLIN HYDROUS EX OINT: 1.0000 "application " | TOPICAL_OINTMENT | CUTANEOUS | Status: DC | PRN

## 2012-01-19 MED ORDER — NALOXONE HCL 0.4 MG/ML IJ SOLN: 0.4000 mg | INTRAMUSCULAR | Status: DC | PRN

## 2012-01-19 MED ORDER — LACTATED RINGERS IV SOLN
INTRAVENOUS | Status: DC
  Administered 2012-01-19 (×3): via INTRAVENOUS

## 2012-01-19 MED ORDER — SODIUM CHLORIDE 0.9 % IJ SOLN: 3.0000 mL | INTRAMUSCULAR | Status: DC | PRN

## 2012-01-19 MED ORDER — DIPHENHYDRAMINE HCL 50 MG/ML IJ SOLN: 25.0000 mg | INTRAMUSCULAR | Status: DC | PRN

## 2012-01-19 MED ORDER — LIDOCAINE-EPINEPHRINE (PF) 2 %-1:200000 IJ SOLN
INTRAMUSCULAR | Status: AC
  Filled 2012-01-19: qty 20

## 2012-01-19 MED ORDER — EPHEDRINE 5 MG/ML INJ
INTRAVENOUS | Status: AC
  Filled 2012-01-19: qty 10

## 2012-01-19 MED ORDER — FENTANYL CITRATE 0.05 MG/ML IJ SOLN: 25.0000 ug | INTRAMUSCULAR | Status: DC | PRN

## 2012-01-19 MED ORDER — SIMETHICONE 80 MG PO CHEW: 80.0000 mg | CHEWABLE_TABLET | ORAL | Status: DC | PRN

## 2012-01-19 MED ORDER — CHLOROPROCAINE HCL 3 % IJ SOLN
INTRAMUSCULAR | Status: AC
  Filled 2012-01-19: qty 20

## 2012-01-19 MED ORDER — ONDANSETRON HCL 4 MG/2ML IJ SOLN
INTRAMUSCULAR | Status: AC
  Filled 2012-01-19: qty 2

## 2012-01-19 MED ORDER — IBUPROFEN 600 MG PO TABS
600.0000 mg | ORAL_TABLET | Freq: Four times a day (QID) | ORAL | Status: DC
  Administered 2012-01-20 – 2012-01-21 (×4): 600 mg via ORAL
  Filled 2012-01-19 (×4): qty 1

## 2012-01-19 MED ORDER — OXYCODONE-ACETAMINOPHEN 5-325 MG PO TABS
1.0000 | ORAL_TABLET | ORAL | Status: DC | PRN
  Administered 2012-01-20: 1 via ORAL
  Administered 2012-01-20: 2 via ORAL
  Administered 2012-01-20 (×2): 1 via ORAL
  Administered 2012-01-21 (×3): 2 via ORAL
  Filled 2012-01-19 (×2): qty 1
  Filled 2012-01-19 (×3): qty 2
  Filled 2012-01-19 (×3): qty 1

## 2012-01-19 MED ORDER — HYDROMORPHONE HCL PF 1 MG/ML IJ SOLN: 0.2500 mg | INTRAMUSCULAR | Status: DC | PRN

## 2012-01-19 MED ORDER — DEXTROSE 5 % IV SOLN
INTRAVENOUS | Status: AC
  Administered 2012-01-19: 100 mL via INTRAVENOUS
  Filled 2012-01-19: qty 2.5

## 2012-01-19 MED ORDER — ONDANSETRON HCL 4 MG/2ML IJ SOLN
INTRAMUSCULAR | Status: DC | PRN
  Administered 2012-01-19: 4 mg via INTRAVENOUS

## 2012-01-19 MED ORDER — KETOROLAC TROMETHAMINE 30 MG/ML IJ SOLN
30.0000 mg | Freq: Four times a day (QID) | INTRAMUSCULAR | Status: AC | PRN
  Administered 2012-01-20 (×2): 30 mg via INTRAVENOUS
  Filled 2012-01-19 (×2): qty 1

## 2012-01-19 MED ORDER — ONDANSETRON HCL 4 MG/2ML IJ SOLN
4.0000 mg | INTRAMUSCULAR | Status: DC | PRN
  Administered 2012-01-19: 4 mg via INTRAVENOUS

## 2012-01-19 MED ORDER — SODIUM CHLORIDE 0.9 % IV SOLN: 1.0000 ug/kg/h | INTRAVENOUS | Status: DC | PRN

## 2012-01-19 MED ORDER — SENNOSIDES-DOCUSATE SODIUM 8.6-50 MG PO TABS
2.0000 | ORAL_TABLET | Freq: Every day | ORAL | Status: DC
  Administered 2012-01-20: 2 via ORAL

## 2012-01-19 MED ORDER — KETOROLAC TROMETHAMINE 60 MG/2ML IM SOLN: 60.0000 mg | Freq: Once | INTRAMUSCULAR | Status: DC | PRN

## 2012-01-19 MED ORDER — BUPIVACAINE HCL (PF) 0.25 % IJ SOLN
INTRAMUSCULAR | Status: AC
  Filled 2012-01-19: qty 30

## 2012-01-19 MED ORDER — LACTATED RINGERS IV SOLN
INTRAVENOUS | Status: DC
  Administered 2012-01-20 (×2): via INTRAVENOUS

## 2012-01-19 MED ORDER — KETOROLAC TROMETHAMINE 30 MG/ML IJ SOLN: 30.0000 mg | Freq: Four times a day (QID) | INTRAMUSCULAR | Status: DC | PRN

## 2012-01-19 MED ORDER — MORPHINE SULFATE (PF) 0.5 MG/ML IJ SOLN
INTRAMUSCULAR | Status: DC | PRN
  Administered 2012-01-19: 2 mg via INTRAVENOUS
  Administered 2012-01-19: 3 mg via EPIDURAL

## 2012-01-19 MED ORDER — CLINDAMYCIN PHOSPHATE 600 MG/50ML IV SOLN: 600.0000 mg | INTRAVENOUS | Status: DC

## 2012-01-19 MED ORDER — WITCH HAZEL-GLYCERIN EX PADS: 1.0000 "application " | MEDICATED_PAD | CUTANEOUS | Status: DC | PRN

## 2012-01-19 MED ORDER — SCOPOLAMINE 1 MG/3DAYS TD PT72
MEDICATED_PATCH | TRANSDERMAL | Status: AC
  Filled 2012-01-19: qty 1

## 2012-01-19 MED ORDER — TETANUS-DIPHTH-ACELL PERTUSSIS 5-2.5-18.5 LF-MCG/0.5 IM SUSP: 0.5000 mL | Freq: Once | INTRAMUSCULAR | Status: DC

## 2012-01-19 MED ORDER — OXYTOCIN 10 UNIT/ML IJ SOLN
INTRAMUSCULAR | Status: AC
  Filled 2012-01-19: qty 4

## 2012-01-19 MED ORDER — SCOPOLAMINE 1 MG/3DAYS TD PT72
1.0000 | MEDICATED_PATCH | Freq: Once | TRANSDERMAL | Status: DC
  Administered 2012-01-19: 1.5 mg via TRANSDERMAL

## 2012-01-19 MED ORDER — PHENYLEPHRINE 40 MCG/ML (10ML) SYRINGE FOR IV PUSH (FOR BLOOD PRESSURE SUPPORT)
PREFILLED_SYRINGE | INTRAVENOUS | Status: AC
  Filled 2012-01-19: qty 15

## 2012-01-19 MED ORDER — SIMETHICONE 80 MG PO CHEW
80.0000 mg | CHEWABLE_TABLET | Freq: Three times a day (TID) | ORAL | Status: DC
  Administered 2012-01-19 – 2012-01-21 (×6): 80 mg via ORAL

## 2012-01-19 MED ORDER — GENTAMICIN IN SALINE 1-0.9 MG/ML-% IV SOLN: 100.0000 mg | INTRAVENOUS | Status: DC

## 2012-01-19 MED ORDER — ONDANSETRON HCL 4 MG PO TABS: 4.0000 mg | ORAL_TABLET | ORAL | Status: DC | PRN

## 2012-01-19 MED ORDER — DIPHENHYDRAMINE HCL 25 MG PO CAPS: 25.0000 mg | ORAL_CAPSULE | Freq: Four times a day (QID) | ORAL | Status: DC | PRN

## 2012-01-19 MED ORDER — FENTANYL CITRATE 0.05 MG/ML IJ SOLN
INTRAMUSCULAR | Status: DC | PRN
  Administered 2012-01-19: 100 ug via EPIDURAL

## 2012-01-19 MED ORDER — DIBUCAINE 1 % RE OINT: 1.0000 "application " | TOPICAL_OINTMENT | RECTAL | Status: DC | PRN

## 2012-01-19 MED ORDER — ONDANSETRON HCL 4 MG/2ML IJ SOLN: 4.0000 mg | Freq: Three times a day (TID) | INTRAMUSCULAR | Status: DC | PRN

## 2012-01-19 MED ORDER — KETOROLAC TROMETHAMINE 30 MG/ML IJ SOLN
INTRAMUSCULAR | Status: AC
  Administered 2012-01-20: 30 mg via INTRAVENOUS
  Filled 2012-01-19: qty 1

## 2012-01-19 MED ORDER — SODIUM BICARBONATE 8.4 % IV SOLN
INTRAVENOUS | Status: DC | PRN
  Administered 2012-01-19: 4 mL via EPIDURAL

## 2012-01-19 MED ORDER — CHLOROPROCAINE HCL 3 % IJ SOLN
INTRAMUSCULAR | Status: DC | PRN
  Administered 2012-01-19: 20 mL

## 2012-01-19 MED ORDER — OXYTOCIN 20 UNITS IN LACTATED RINGERS INFUSION - SIMPLE
INTRAVENOUS | Status: DC | PRN
  Administered 2012-01-19: 20 [IU] via INTRAVENOUS

## 2012-01-19 MED ORDER — FENTANYL CITRATE 0.05 MG/ML IJ SOLN
INTRAMUSCULAR | Status: AC
  Filled 2012-01-19: qty 2

## 2012-01-19 MED ORDER — SCOPOLAMINE 1 MG/3DAYS TD PT72: 1.0000 | MEDICATED_PATCH | Freq: Once | TRANSDERMAL | Status: DC

## 2012-01-19 MED ORDER — ZOLPIDEM TARTRATE 5 MG PO TABS: 5.0000 mg | ORAL_TABLET | Freq: Every evening | ORAL | Status: DC | PRN

## 2012-01-19 MED ORDER — MENTHOL 3 MG MT LOZG: 1.0000 | LOZENGE | OROMUCOSAL | Status: DC | PRN

## 2012-01-19 MED ORDER — METOCLOPRAMIDE HCL 5 MG/ML IJ SOLN: 10.0000 mg | Freq: Three times a day (TID) | INTRAMUSCULAR | Status: DC | PRN

## 2012-01-19 MED ORDER — MORPHINE SULFATE 0.5 MG/ML IJ SOLN
INTRAMUSCULAR | Status: AC
  Filled 2012-01-19: qty 10

## 2012-01-19 SURGICAL SUPPLY — 67 items
BENZOIN TINCTURE PRP APPL 2/3 (GAUZE/BANDAGES/DRESSINGS) IMPLANT
BLADE SURG 15 STRL LF C SS BP (BLADE) IMPLANT
BLADE SURG 15 STRL SS (BLADE)
CHLORAPREP W/TINT 26ML (MISCELLANEOUS) ×3 IMPLANT
CLOTH BEACON ORANGE TIMEOUT ST (SAFETY) ×3 IMPLANT
COTTONBALL LRG STERILE PKG (GAUZE/BANDAGES/DRESSINGS) IMPLANT
DECANTER SPIKE VIAL GLASS SM (MISCELLANEOUS) ×3 IMPLANT
DERMABOND ADVANCED (GAUZE/BANDAGES/DRESSINGS)
DERMABOND ADVANCED .7 DNX12 (GAUZE/BANDAGES/DRESSINGS) IMPLANT
DRAIN PENROSE 1/2X12 (DRAIN) IMPLANT
DRESSING TELFA 8X3 (GAUZE/BANDAGES/DRESSINGS) ×3 IMPLANT
DRSG COVADERM 4X6 (GAUZE/BANDAGES/DRESSINGS) ×3 IMPLANT
DRSG PAD ABDOMINAL 8X10 ST (GAUZE/BANDAGES/DRESSINGS) ×3 IMPLANT
ELECT CAUTERY BLADE 6.4 (BLADE) IMPLANT
ELECT REM PT RETURN 9FT ADLT (ELECTROSURGICAL) ×6
ELECTRODE REM PT RTRN 9FT ADLT (ELECTROSURGICAL) ×4 IMPLANT
EXTRACTOR VACUUM M CUP 4 TUBE (SUCTIONS) IMPLANT
GAUZE SPONGE 4X4 12PLY STRL LF (GAUZE/BANDAGES/DRESSINGS) IMPLANT
GAUZE SPONGE 4X4 16PLY XRAY LF (GAUZE/BANDAGES/DRESSINGS) IMPLANT
GLOVE BIO SURGEON STRL SZ8 (GLOVE) ×3 IMPLANT
GLOVE BIOGEL PI IND STRL 8 (GLOVE) ×2 IMPLANT
GLOVE BIOGEL PI INDICATOR 8 (GLOVE) ×1
GLOVE ECLIPSE 8.0 STRL XLNG CF (GLOVE) ×3 IMPLANT
GLOVE SURG ORTHO 8.0 STRL STRW (GLOVE) ×3 IMPLANT
GOWN PREVENTION PLUS LG XLONG (DISPOSABLE) ×9 IMPLANT
GOWN PREVENTION PLUS XLARGE (GOWN DISPOSABLE) ×3 IMPLANT
GOWN STRL NON-REIN LRG LVL3 (GOWN DISPOSABLE) ×3 IMPLANT
KIT ABG SYR 3ML LUER SLIP (SYRINGE) ×3 IMPLANT
KIT BASIN OR (CUSTOM PROCEDURE TRAY) ×3 IMPLANT
MESH ULTRAPRO 6X6 15CM15CM (Mesh General) ×6 IMPLANT
NEEDLE HYPO 22GX1.5 SAFETY (NEEDLE) ×3 IMPLANT
NEEDLE HYPO 25X1 1.5 SAFETY (NEEDLE) IMPLANT
NEEDLE HYPO 25X5/8 SAFETYGLIDE (NEEDLE) ×3 IMPLANT
NS IRRIG 1000ML POUR BTL (IV SOLUTION) ×3 IMPLANT
PACK ABDOMINAL GYN (CUSTOM PROCEDURE TRAY) ×3 IMPLANT
PACK C SECTION WH (CUSTOM PROCEDURE TRAY) ×3 IMPLANT
PAD ABD 7.5X8 STRL (GAUZE/BANDAGES/DRESSINGS) IMPLANT
PENCIL BUTTON HOLSTER BLD 10FT (ELECTRODE) ×3 IMPLANT
SLEEVE SCD COMPRESS KNEE MED (MISCELLANEOUS) IMPLANT
SPONGE GAUZE 2X2 8PLY STRL LF (GAUZE/BANDAGES/DRESSINGS) ×3 IMPLANT
SPONGE GAUZE 4X4 12PLY (GAUZE/BANDAGES/DRESSINGS) ×6 IMPLANT
SPONGE LAP 18X18 X RAY DECT (DISPOSABLE) ×6 IMPLANT
STAPLER VISISTAT 35W (STAPLE) ×3 IMPLANT
STRIP CLOSURE SKIN 1/2X4 (GAUZE/BANDAGES/DRESSINGS) IMPLANT
SUT ETHIBOND NAB CT1 #1 30IN (SUTURE) IMPLANT
SUT MNCRL 0 VIOLET CTX 36 (SUTURE) ×6 IMPLANT
SUT MNCRL AB 4-0 PS2 18 (SUTURE) ×3 IMPLANT
SUT MON AB 4-0 PS1 27 (SUTURE) ×3 IMPLANT
SUT MONOCRYL 0 CTX 36 (SUTURE) ×3
SUT NOVA NAB GS-21 0 18 T12 DT (SUTURE) IMPLANT
SUT PDS AB 1 CT  36 (SUTURE) ×2
SUT PDS AB 1 CT 36 (SUTURE) ×4 IMPLANT
SUT PROLENE 0 CT 1 30 (SUTURE) IMPLANT
SUT PROLENE 0 CT 1 CR/8 (SUTURE) IMPLANT
SUT VIC AB 0 CT1 27 (SUTURE) ×1
SUT VIC AB 0 CT1 27XBRD ANBCTR (SUTURE) ×2 IMPLANT
SUT VIC AB 1 CTX 36 (SUTURE)
SUT VIC AB 1 CTX36XBRD ANBCTRL (SUTURE) IMPLANT
SUT VIC AB 3-0 SH 27 (SUTURE) ×1
SUT VIC AB 3-0 SH 27X BRD (SUTURE) ×2 IMPLANT
SUT VICRYL 0 UR6 27IN ABS (SUTURE) ×9 IMPLANT
SYR CONTROL 10ML LL (SYRINGE) ×3 IMPLANT
TAPE CLOTH SURG 4X10 WHT LF (GAUZE/BANDAGES/DRESSINGS) ×3 IMPLANT
TOWEL OR 17X24 6PK STRL BLUE (TOWEL DISPOSABLE) ×6 IMPLANT
TOWEL OR 17X26 10 PK STRL BLUE (TOWEL DISPOSABLE) ×3 IMPLANT
TRAY FOLEY CATH 14FR (SET/KITS/TRAYS/PACK) ×3 IMPLANT
WATER STERILE IRR 1000ML POUR (IV SOLUTION) IMPLANT

## 2012-01-19 NOTE — Consult Note (Signed)
Neonatology Note:   Attendance at C-section:    I was asked to attend this repeat C/S at term. The mother is a G3P1A1 B pos, GBS unknown with a history of anxiety, history of HSV, and smoking. ROM at delivery, fluid clear. Infant vigorous with good spontaneous cry and tone. Needed  bulb suctioning several times for increased oral secretions. Ap 9/9. Lungs clear to ausc in DR. To CN to care of Pediatrician.   Deatra James, MD

## 2012-01-19 NOTE — Addendum Note (Signed)
Addendum  created 01/19/12 1859 by Zaivion Kundrat, CRNA   Modules edited:Anesthesia Medication Administration    

## 2012-01-19 NOTE — OR Nursing (Signed)
Ultrapro Mesh implanted per Dr. Michaell Cowing in right ingunlal area. Lot # [id number]. Exp Date November 2016. Ultrapro Mesh implanted in left inguinal area per Dr.Gross. Lot # [id number]. Exp Date January  2017

## 2012-01-19 NOTE — Addendum Note (Signed)
Addendum  created 01/19/12 1859 by Pat Patrick, CRNA   Modules edited:Anesthesia Medication Administration

## 2012-01-19 NOTE — Transfer of Care (Signed)
Immediate Anesthesia Transfer of Care Note  Patient: Christine Rice [facility]  Procedure(s) Performed:  CESAREAN SECTION - repeat; HERNIA REPAIR INGUINAL ADULT BILATERAL; HERNIA REPAIR UMBILICAL ADULT  Patient Location: PACU  Anesthesia Type: Regional  Level of Consciousness: awake, alert  and oriented  Airway & Oxygen Therapy: Patient Spontanous Breathing  Post-op Assessment: Report given to PACU RN and Post -op Vital signs reviewed and stable  Post vital signs: Reviewed and stable  Complications: No apparent anesthesia complications

## 2012-01-19 NOTE — Op Note (Signed)
NAMETRISHA, KEN               ACCOUNT NO.:  [id number]  MEDICAL RECORD NO.:  [id number]  LOCATION:  [facility]                          FACILITY:  [facility]  PHYSICIAN:  Ardeth Sportsman, MD     DATE OF BIRTH:  04-03-1981  DATE OF PROCEDURE:  01/19/2012 DATE OF DISCHARGE:                              OPERATIVE REPORT   PRIMARY CARE PHYSICIAN:  Dr. Deeann Saint.  OBSTETRICIAN:  Dineen Kid. Rana Snare, MD  SURGEON:  Ardeth Sportsman, MD  PREOPERATIVE DIAGNOSES:  Bilateral inguinal hernias, umbilical hernia, full-term pregnancy, request for cesarean section.  PROCEDURE PERFORMED: 1. Primary umbilical hernia repair. 2. Bilateral inguinal hernia repairs with mesh (preperitoneal/Rives-     Stoppa technique).  ANESTHESIA: 1. Spinal anesthesia. 2. Local anesthetic in a field block.  SPECIMENS:  None.  DRAINS:  None.  ESTIMATED BLOOD LOSS:  My part, minimal.  See Dr. Vance Gather notes.  INDICATIONS:  Ms. Levenhagen is a 31 year old female who had a known hernia in the past.  She became pregnant and requested repair at that time at cesarean section given her prior history of cesarean section.  She was felt to be appropriate for cesarean section.  I felt inguinal hernias in on both sides, and a small umbilical hernia as well.  I offered repair concurrently with cesarean infection.  Technique of repair with the preperitoneal possible standard open cutdown technique was discussed. Used mesh to decrease recurrence was discussed.  Risks, benefits, and alternatives were discussed.  Risk of recurrence, especially with smoking, etc., were discussed.  Questions answered.  She agreed to proceed.  OPERATIVE FINDINGS:  She had bilateral indirect inguinal hernias.  No obvious direct or femoral obturator defects.  She had a small umbilical hernia through the stalk.  DESCRIPTION OF PROCEDURE:  Informed consent was confirmed.  The patient already had spinal anesthesia without any difficulty.  I assisted Dr. Rana Snare with  a cesarean section through a classic Pfannenstiel incision. Please see his note for further details.  On completion of the cesarean section, I went ahead and mobilized the peritoneum off bilateral lower quadrants starting with the right side. I freed peritoneum off to the pubic rim.  I freed off laterally and freed the peritoneum off the arcuate ligament as well to get into the Mid-abdomen preperitoneal plane.  I freed the peritoneum off the retroperitoneal structures including psoas muscle and the iliac system.  I freed the right anteromedial bladder off the right pelvic sidewall as well.  I freed peritoneum off the around ligament as well.  I did dissection in a similar mirror-image fashion on the left side.  I went ahead and chose on both sides.  I did focus on primary umbilical hernia repair.  It was a small defect, less than a centimeter in size.  I went ahead and repaired it using 0 Vicryl interrupted stitches vertically from the peritoneal side taking healthy bite.  Came together well and had good closure.  She did have some sensitivity around there, and so local anesthetic was used, and she tolerated it much better.  I focused back to the preperitoneal repairs.  I went and chose a 15 x 15 cm ultra lightweight polypropylene (  UltraPro mesh).  I cut a slit in the mesh such that a 6 x 6 cm slit rest in the true pelvis between the bladder sidewall and the bladder.  I laid them in overlapping mirror- image diamonds.  I used a piece of mesh for each side.  The meshs overlapped across the midline.  I tucked the mesh on to cover over the iliac and psoas as in the retroperitoneal region and allowed to go lateral to that.  That provided at least 2 inches circumferential coverage around both internal rings.  Direct space and femoral obturator foramina were covered as well.  I carefully repositioned and tucked. There were a few small peritoneal breaches, which I closed using 0 Vicryl  interrupted stitches.  We reapproximated the wound using a 0 Vicryl running stitch to reapproximate the peritoneum and the rectus muscles towards the midline. We did irrigation and then reapproximated the anterior rectus fascia transversely using #1 running PDS.  I then closed the skin using 4-0 Monocryl running stitch.  Sterile dressing was applied.  The patient was hemodynamically stable through the case and did well. She was sent to recovery room in stable condition.  I discussed postop care with the patient in the holding area and discuss with family as well.     Ardeth Sportsman, MD     SCG/MEDQ  D:  01/19/2012  T:  01/19/2012  Job:  [id number]  cc:   Dr. Deeann Saint

## 2012-01-19 NOTE — Anesthesia Postprocedure Evaluation (Signed)
Anesthesia Post Note  Patient: Christine Rice  Procedure(s) Performed:  CESAREAN SECTION - repeat; HERNIA REPAIR INGUINAL ADULT BILATERAL; HERNIA REPAIR UMBILICAL ADULT  Anesthesia type: Spinal  Patient location: PACU  Post pain: Pain level controlled  Post assessment: Post-op Vital signs reviewed  Last Vitals:  Filed Vitals:   01/19/12 1113  BP: 126/85  Pulse: 81  Temp: 36.6 C  Resp: 18    Post vital signs: Reviewed  Level of consciousness: awake  Complications: No apparent anesthesia complications

## 2012-01-19 NOTE — Anesthesia Preprocedure Evaluation (Signed)
Anesthesia Evaluation  Patient identified by MRN, date of birth, ID band Patient awake    Reviewed: Allergy & Precautions, H&P , Patient's Chart, lab work & pertinent test results  Airway Mallampati: II TM Distance: >3 FB Neck ROM: full    Dental No notable dental hx.    Pulmonary neg pulmonary ROS,  clear to auscultation  Pulmonary exam normal       Cardiovascular neg cardio ROS regular Normal    Neuro/Psych PSYCHIATRIC DISORDERS Anxiety Depression Negative Neurological ROS  Negative Psych ROS   GI/Hepatic negative GI ROS, Neg liver ROS,   Endo/Other  Negative Endocrine ROS  Renal/GU negative Renal ROS     Musculoskeletal   Abdominal   Peds  Hematology negative hematology ROS (+)   Anesthesia Other Findings   Reproductive/Obstetrics (+) Pregnancy                           Anesthesia Physical Anesthesia Plan  ASA: II  Anesthesia Plan: Epidural   Post-op Pain Management:    Induction:   Airway Management Planned:   Additional Equipment:   Intra-op Plan:   Post-operative Plan:   Informed Consent: I have reviewed the patients History and Physical, chart, labs and discussed the procedure including the risks, benefits and alternatives for the proposed anesthesia with the patient or authorized representative who has indicated his/her understanding and acceptance.     Plan Discussed with:   Anesthesia Plan Comments:         Anesthesia Quick Evaluation

## 2012-01-19 NOTE — Op Note (Signed)
Cesarean Section Procedure Note  Pre-operative Diagnosis: 38 1/2weeks, Prev C/S for repeat, labor, inguinal and umbilical hernias  Post-operative Diagnosis: same  Surgeon: Turner Daniels   Assistants: Gross  Anesthesia:spinal/epidural  Procedure:  Low Segment Transverse cesarean section, herniorrhaphy per Dr Michaell Cowing  Procedure Details  The patient was seen in the Holding Room. The risks, benefits, complications, treatment options, and expected outcomes were discussed with the patient.  The patient concurred with the proposed plan, giving informed consent.  The site of surgery properly noted/marked.. A Time Out was held and the above information confirmed.  After induction of anesthesia, the patient was draped and prepped in the usual sterile manner. A Pfannenstiel incision was made and carried down through the subcutaneous tissue to the fascia. Fascial incision was made and extended transversely. The fascia was separated from the underlying rectus tissue superiorly and inferiorly. The peritoneum was identified and entered. Peritoneal incision was extended longitudinally. The utero-vesical peritoneal reflection was incised transversely and the bladder flap was bluntly freed from the lower uterine segment. A low transverse uterine incision was made. Delivered from Vertex presentation was a 6+9 pound baby with Apgar scores of 9 at one minute and 9 at five minutes. After the umbilical cord was clamped and cut cord blood was obtained for evaluation. The placenta was removed intact and appeared normal. The uterine outline, tubes and ovaries appeared normal. The uterine incision was closed with running locked sutures of 0 monocryl and imbricated with 0 monocryl. Hemostasis was observed. Lavage was carried out until clear. The peritoneum was then closed with 0 monocryl and rectus muscles plicated in the midline.  The hernia repairs were then carried out by Dr Michaell Cowing and will be dictated seperately.  After  hemostasis was assured, the fascia was then reapproximated with running sutures of 1-0 ODS. Irrigation was applied and after adequate hemostasis was assured, the skin was reapproximated with 2-0 monocryl subcuticular stitch.  Instrument, sponge, and needle counts were correct prior the abdominal closure and at the conclusion of the case. The patient received clindamycin and gent preoperatively.  Findings: Viable female,  Ph art 7.33  Estimated Blood Loss:  600         Specimens: Placenta was sent to L&D         Complications:  None

## 2012-01-19 NOTE — Brief Op Note (Signed)
01/19/2012  6:38 PM  PATIENT:  Enis Gash Band  31 y.o. female  Patient Care Team: Roxy Manns, MD as PCP - General Turner Daniels, MD as Attending Physician (Obstetrics and Gynecology)  PRE-OPERATIVE DIAGNOSIS:  Bilateral Inguinal Hernias, Umbilical Hernia  POST-OPERATIVE DIAGNOSIS:  Bilateral Inguinal Hernias,Umbilical Hernia  PROCEDURE:  Procedure(s):  HERNIA REPAIR INGUINAL ADULT BILATERAL with mesh Primary HERNIA REPAIR UMBILICAL ADULT  SURGEON:  Surgeon(s): Ardeth Sportsman, MD  ASSISTANTS: Candice Camp, MD  ANESTHESIA:   local and spinal  EBL:  Total I/O In: 2600 [I.V.:2600] Out: 675 [Urine:175; Blood:500]  BLOOD ADMINISTERED:none  DRAINS: none   LOCAL MEDICATIONS USED:  BUPIVICAINE 20CC  SPECIMEN:  No Specimen  DISPOSITION OF SPECIMEN:  N/A  COUNTS:  YES  TOURNIQUET:  * No tourniquets in log *  DICTATION: .Other Dictation: Dictation Number [id number]  PLAN OF CARE: Admit for overnight observation  PATIENT DISPOSITION:  PACU - hemodynamically stable.   Delay start of Pharmacological VTE agent (>24hrs) due to surgical blood loss or risk of bleeding:  {YES/NO/NOT APPLICABLE:20182

## 2012-01-19 NOTE — Anesthesia Procedure Notes (Signed)
Spinal  Patient location during procedure: OR Start time: 01/19/2012 5:17 PM End time: 01/19/2012 5:23 PM Staffing Anesthesiologist: Sandrea Hughs Performed by: anesthesiologist  Preanesthetic Checklist Completed: patient identified, site marked, surgical consent, pre-op evaluation, timeout performed, IV checked, risks and benefits discussed and monitors and equipment checked Spinal Block Patient position: sitting Prep: DuraPrep Patient monitoring: cardiac monitor, continuous pulse ox, blood pressure and heart rate Approach: midline Location: L3-4 Injection technique: catheter Needle Needle type: Tuohy and Sprotte  Needle gauge: 24 G Needle length: 12.7 cm Needle insertion depth: 5 cm Catheter type: closed end flexible Catheter size: 19 g Catheter at skin depth: 10 cm Assessment Sensory level: T6 Events: paresthesia Additional Notes Transient R leg paresthesia X 1 with spinal needle and epidural catheter. Epidural will be left in for postoperative pain.

## 2012-01-20 LAB — CBC
HCT: 30.9 % — ABNORMAL LOW (ref 36.0–46.0)
Hemoglobin: 10.4 g/dL — ABNORMAL LOW (ref 12.0–15.0)
MCHC: 33.7 g/dL (ref 30.0–36.0)
RBC: 3.4 MIL/uL — ABNORMAL LOW (ref 3.87–5.11)

## 2012-01-20 NOTE — Anesthesia Postprocedure Evaluation (Signed)
  Anesthesia Post-op Note  Patient: Christine Rice  Procedure(s) Performed:  CESAREAN SECTION - repeat; HERNIA REPAIR INGUINAL ADULT BILATERAL; HERNIA REPAIR UMBILICAL ADULT  Patient Location: PACU and Mother/Baby  Anesthesia Type: Epidural  Level of Consciousness: awake, alert  and oriented  Airway and Oxygen Therapy: Patient Spontanous Breathing  Post-op Pain: none  Post-op Assessment: Post-op Vital signs reviewed  Post-op Vital Signs: Reviewed and stable  Complications: No apparent anesthesia complications

## 2012-01-20 NOTE — Progress Notes (Signed)
Subjective: Postpartum Day 1: Cesarean Delivery Patient reports tolerating PO.    Objective: Vital signs in last 24 hours: Temp:  [97.6 F (36.4 C)-98.6 F (37 C)] 98.1 F (36.7 C) (02/08 0641) Pulse Rate:  [56-81] 69  (02/08 0641) Resp:  [15-18] 18  (02/08 0641) BP: (91-126)/(54-87) 91/56 mmHg (02/08 0641) SpO2:  [95 %-100 %] 96 % (02/08 0641) Weight:  [78.926 kg (174 lb)] 78.926 kg (174 lb) (02/07 2115)  Physical Exam:  General: alert and cooperative Lochia: appropriate Uterine Fundus: firm Abd dressing CDI. ecchymosis noted superior to dressing,soft  DVT Evaluation: No evidence of DVT seen on physical exam.   Basename 01/20/12 0533 01/18/12 1511  HGB 10.4* 12.4  HCT 30.9* 37.5    Assessment/Plan: Status post Cesarean section. Doing well postoperatively.  Continue current care.  Khalid Lacko G 01/20/2012, 7:54 AM

## 2012-01-20 NOTE — Addendum Note (Signed)
Addendum  created 01/20/12 0844 by Isabella Bowens, CRNA   Modules edited:Notes Section

## 2012-01-20 NOTE — Progress Notes (Signed)
Patient was referred for history of depression/anxiety. * Referral screened out by Clinical Social Worker because none of the following criteria appear to apply: ~ History of anxiety/depression during this pregnancy, or of post-partum depression. ~ Diagnosis of anxiety and/or depression within last 3 years ~ History of depression due to pregnancy loss/loss of child OR * Patient's symptoms currently being treated with medication and/or therapy. Please contact the Clinical Social Worker if needs arise, or if patient requests.  Patient has prescription for Xanax.

## 2012-01-21 MED ORDER — OXYCODONE-ACETAMINOPHEN 5-500 MG PO CAPS: 1.0000 | ORAL_CAPSULE | ORAL | Status: AC | PRN

## 2012-01-21 NOTE — Progress Notes (Signed)
Subjective: Postpartum Day two: Cesarean Delivery Patient reports incisional pain.    Objective: Vital signs in last 24 hours: Temp:  [96.9 F (36.1 C)-97.9 F (36.6 C)] 97.7 F (36.5 C) (02/09 0503) Pulse Rate:  [64-74] 67  (02/09 0503) Resp:  [16-18] 18  (02/09 0503) BP: (95-105)/(54-65) 95/54 mmHg (02/09 0503) SpO2:  [97 %-98 %] 98 % (02/08 1440)  Physical Exam:  General: alert Lochia: appropriate Uterine Fundus: firm Incision: incision intact large ecchymosis DVT Evaluation: No evidence of DVT seen on physical exam.   Basename 01/20/12 0533 01/18/12 1511  HGB 10.4* 12.4  HCT 30.9* 37.5    Assessment/Plan: Status post Cesarean section. Doing well postoperatively.  Continue current care.  Sabrea Sankey S 01/21/2012, 8:29 AM

## 2012-01-21 NOTE — Discharge Summary (Signed)
Obstetric Discharge Summary Reason for Admission: spontaneeous onset of labor repaeat cesareran section umbilical and inguinal herrnias Prenatal Procedures: none Intrapartum Procedures: low transverse cesarean section repair of inguinal and umbilcial hernias Postpartum Procedures: none Complications-Operative and Postpartum: none Hemoglobin  Date Value Range Status  01/20/2012 10.4* 12.0-15.0 (g/dL) Final     HCT  Date Value Range Status  01/20/2012 30.9* 36.0-46.0 (%) Final    Discharge Diagnoses: Term Pregnancy-delivered  Discharge Information: Date: 01/21/2012 Activity: pelvic rest Diet: routine Medications: Percocet Condition: stable Instructions: refer to practice specific booklet Discharge to: home Follow-up Information    Follow up with GROSS,STEVEN C., MD in 3 weeks.   Contact information:   [employer] [address] [place] [address] [phone]          Newborn Data: Live born female  Birth Weight: 6 lb 9.6 oz (2995 g) APGAR: 9, 9  Home with mother.  Tieler Cournoyer S 01/21/2012, 5:11 PM

## 2012-01-23 ENCOUNTER — Encounter (HOSPITAL_COMMUNITY): Payer: Self-pay | Admitting: Obstetrics and Gynecology

## 2012-02-07 ENCOUNTER — Telehealth (INDEPENDENT_AMBULATORY_CARE_PROVIDER_SITE_OTHER): Payer: Self-pay

## 2012-02-07 ENCOUNTER — Telehealth (INDEPENDENT_AMBULATORY_CARE_PROVIDER_SITE_OTHER): Payer: Self-pay | Admitting: General Surgery

## 2012-02-07 NOTE — Telephone Encounter (Signed)
Call her gyn.

## 2012-02-07 NOTE — Telephone Encounter (Signed)
Called pt to notify her that Dr Luisa Hart wants her to call her GYN doctor to get another refill.

## 2012-02-07 NOTE — Telephone Encounter (Signed)
Patient called status post cesarean section and hernia repair. Patient has been receiving pain medicine from her OBGYN but is now asking for a refill from [place]. I advised patient if they have been giving her pain medicine that she should first contact them. She thinks pain is just from hernia repair now and does not want to contact her OBGYN. Made her aware Dr Michaell Cowing is not available today and I would have to run this by another MD. She was taking oxycodone. Please advise.

## 2012-03-26 ENCOUNTER — Telehealth (INDEPENDENT_AMBULATORY_CARE_PROVIDER_SITE_OTHER): Payer: Self-pay | Admitting: Surgery

## 2012-03-28 ENCOUNTER — Encounter (INDEPENDENT_AMBULATORY_CARE_PROVIDER_SITE_OTHER): Payer: Self-pay | Admitting: Surgery

## 2012-03-28 ENCOUNTER — Ambulatory Visit (INDEPENDENT_AMBULATORY_CARE_PROVIDER_SITE_OTHER): Payer: BC Managed Care – PPO | Admitting: Surgery

## 2012-03-28 VITALS — BP 118/66 | HR 80 | Temp 98.2°F | Resp 16 | Ht 66.0 in | Wt 157.0 lb

## 2012-03-28 DIAGNOSIS — K429 Umbilical hernia without obstruction or gangrene: Secondary | ICD-10-CM

## 2012-03-28 DIAGNOSIS — K4020 Bilateral inguinal hernia, without obstruction or gangrene, not specified as recurrent: Secondary | ICD-10-CM

## 2012-03-28 NOTE — Progress Notes (Signed)
Subjective:     Patient ID: Christine Rice, female   DOB: [birth year], 31 y.o.   MRN: [id number]  HPI  Christine Rice  1981/06/02 [id number]  Patient Care Team: Judy Pimple, MD as PCP - General Turner Daniels, MD as Attending Physician (Obstetrics and Gynecology)  This patient is a 31 y.o.female who presents today for surgical evaluation.  Procedure: Open preperitoneal bilateral inguinal hernia appears with mesh. Primary umbilical hernia repair 14Feb2013  The patient comes in today feeling well. Mild soreness in the right groin. Off narcotics. Eating well. Regular bowel movements. Close to regular activity. Holding off on more intensive lifting. Her daughter is doing well. No fevers or chills  Patient Active Problem List  Diagnoses  . HPV  . ANXIETY  . DEPRESSION  . ADD  . ABSCESS, TOOTH  . IBS  . Cesarean delivery delivered    Past Medical History  Diagnosis Date  . Anxiety     Past Surgical History  Procedure Date  . Cesarean section 01/06/05  . Cesarean section 01/19/2012    Procedure: CESAREAN SECTION;  Surgeon: Turner Daniels, MD;  Location: [facility];  Service: Gynecology;  Laterality: N/A;  repeat  . Inguinal hernia repair 01/19/2012    Procedure: HERNIA REPAIR INGUINAL ADULT BILATERAL;  Surgeon: Ardeth Sportsman, MD;  Location: [facility];  Service: General;  Laterality: Bilateral;  . Umbilical hernia repair 01/19/2012    Procedure: HERNIA REPAIR UMBILICAL ADULT;  Surgeon: Ardeth Sportsman, MD;  Location: [facility];  Service: General;  Laterality: N/A;    History   Social History  . Marital Status: Legally Separated    Spouse Name: N/A    Number of Children: N/A  . Years of Education: N/A   Occupational History  . Not on file.   Social History Main Topics  . Smoking status: Current Everyday Smoker -- 0.2 packs/day  . Smokeless tobacco: Never Used  . Alcohol Use: No  . Drug Use: No  . Sexually Active:    Other Topics Concern  . Not on file   Social History Narrative   . No narrative on file    History reviewed. No pertinent family history.  Current Outpatient Prescriptions  Medication Sig Dispense Refill  . PRENATAL VITAMINS PO Take by mouth daily.           Allergies  Allergen Reactions  . Amoxicillin Hives    REACTION: rash    BP 118/66  Pulse 80  Temp(Src) 98.2 F (36.8 C) (Temporal)  Resp 16  Ht 5\' 6"  (1.676 m)  Wt 157 lb (71.215 kg)  BMI 25.34 kg/m2     Review of Systems  Constitutional: Negative for fever, chills and diaphoresis.  HENT: Negative for ear pain, sore throat and trouble swallowing.   Eyes: Negative for photophobia and visual disturbance.  Respiratory: Negative for cough and choking.   Cardiovascular: Negative for chest pain and palpitations.  Gastrointestinal: Negative for nausea, vomiting, abdominal pain, diarrhea, constipation, anal bleeding and rectal pain.  Genitourinary: Negative for dysuria, frequency and difficulty urinating.  Musculoskeletal: Negative for myalgias and gait problem.  Skin: Negative for color change, pallor and rash.  Neurological: Negative for dizziness, speech difficulty, weakness and numbness.  Hematological: Negative for adenopathy.  Psychiatric/Behavioral: Negative for confusion and agitation. The patient is not nervous/anxious.        Objective:   Physical Exam  Constitutional: She is oriented to person, place, and time. She appears well-developed and well-nourished. No  distress.  HENT:  Head: Normocephalic.  Mouth/Throat: Oropharynx is clear and moist. No oropharyngeal exudate.  Eyes: Conjunctivae and EOM are normal. Pupils are equal, round, and reactive to light. No scleral icterus.  Neck: Normal range of motion. No tracheal deviation present.  Cardiovascular: Normal rate and intact distal pulses.   Pulmonary/Chest: Effort normal. No respiratory distress. She exhibits no tenderness.  Abdominal: Soft. She exhibits no distension. There is no tenderness. Hernia confirmed  negative in the right inguinal area and confirmed negative in the left inguinal area.         Incisions clean with normal healing ridges.  No hernias  Genitourinary: No vaginal discharge found.  Musculoskeletal: Normal range of motion. She exhibits no tenderness.  Lymphadenopathy:       Right: No inguinal adenopathy present.       Left: No inguinal adenopathy present.  Neurological: She is alert and oriented to person, place, and time. No cranial nerve deficit. She exhibits normal muscle tone. Coordination normal.  Skin: Skin is warm and dry. No rash noted. She is not diaphoretic.  Psychiatric: She has a normal mood and affect. Her behavior is normal.       Assessment:     S/p Umb & BIH repairs along w Dr. Vance Gather C/S for her newborn, recovering well    Plan:     Increase activity as tolerated.  Do not push through pain.  Advanced on diet as tolerated. Bowel regimen to avoid problems.  Return to clinic p.r.n. The patient expressed understanding and appreciation

## 2012-03-28 NOTE — Patient Instructions (Signed)

## 2012-04-11 ENCOUNTER — Telehealth: Payer: Self-pay | Admitting: Family Medicine

## 2012-04-11 NOTE — Telephone Encounter (Signed)
Caller: Christine Rice; PCP: Roxy Manns A.; CB#: [phone]; Call regarding Dog Tick Bite (04/08/12).  Now dime-sized area on R forearm is  raised, red, hard; Onset: 04/08/12.  Afebrile.  Tried "squeezing" area on 04/08/12 and 04/09/12.  Lactating; concerned about spread via breast milk. Health insurance lapses 05/12/12-06/10/12. Had "miserable headache" and fatigue 04/10/12; headache and fatigue resolved by 04/11/12. Marina IUD. Advised to see MD within 24 hrs per nursing judgement for tick bite per Insect or Spider Bites and Stings Guideline. Declined appt for 1415 04/11/12; transferred to office/ Forest Becker for different appt. Scheduled for 04/13/12 at 0845 with Dr Milinda Antis.

## 2012-04-11 NOTE — Telephone Encounter (Signed)
Will see her at that appt  

## 2012-04-13 ENCOUNTER — Ambulatory Visit: Payer: BC Managed Care – PPO | Admitting: Family Medicine

## 2013-03-12 ENCOUNTER — Telehealth: Payer: Self-pay

## 2013-03-12 MED ORDER — ALPRAZOLAM 0.25 MG PO TABS: 0.2500 mg | ORAL_TABLET | Freq: Two times a day (BID) | ORAL | Status: DC | PRN

## 2013-03-12 NOTE — Telephone Encounter (Signed)
That is ok - I talked to her about it recently when she was here with her daughter Px written for call in   Will start with 30 and then see how often she tends to need it

## 2013-03-12 NOTE — Telephone Encounter (Signed)
Rx called in as prescribed an pt notified

## 2013-03-12 NOTE — Telephone Encounter (Signed)
Pt left v/m requesting refill alprazolam 0.5 mg or 0.25 mg. Prior to pts pregnancy pt was taking alprazolam 0.5 mg but thinks now 0.25 mg would be OK. Pt did not leave pharmacy. Could not tell last time pt seen. Tried to call pt to get more info and mail box is full so could not leave v/m.

## 2013-04-29 ENCOUNTER — Telehealth: Payer: Self-pay

## 2013-04-29 NOTE — Telephone Encounter (Signed)
To: [facility] (After Hours Triage) Fax: [phone] From: [facility] Date/ Time: 04/28/2013 2:32 PM Taken By: Frederico Hamman, RN Caller: [name] Facility: Not Collected Patient: Christine Rice, Christine Rice DOB: 03-Nov-1981 Phone: [phone] Reason for Call: Caller was unable to be reached on callback - Left Message Regarding Appointment: No Appt Date: Appt Time: Unknown Provider: Reason: Details: Outcome:

## 2013-04-29 NOTE — Telephone Encounter (Signed)
Aware- agree with adv to go to ER or UC - if she can see a dentist urgently that is the best option

## 2013-04-29 NOTE — Telephone Encounter (Signed)
Triage Record Num: [id number] Operator: Caswell Corwin Patient Name: Christine Rice Call Date & Time: 04/28/2013 6:07:27PM Patient Phone: PCP: Idamae Schuller A. Tower Patient Gender: Female PCP Fax : Patient DOB: September 10, 1981 Practice Name: [facility] Reason for Call: Caller: Crystalee/Patient; PCP: Roxy Manns [facility]); CB#: [phone]; Call regarding Missed c/b Swollen gums and tooth pain needs antibiotic and pain medication. Having abcess in right front tooth to the right if her front tooth. Has had problem with this tooth for several weeks. Pt took Tramadol x 2 earlier and it did not help. Has throbbing on the right side of her head. Triaged Teeth and Jaw Symptoms and needs to be seen in 24 hrs for new onset of jaw pain and tenderness in the temple area. May go to an Urgent or ED for antibiotics and possible pain medications, but these cannnot be called in. Protocol(s) Used: Teeth and Jaw Symptoms Recommended Outcome per Protocol: See Provider within 24 hours Reason for Outcome: New onset of jaw pain AND pain/tenderness in temple area Care Advice: ~ Another adult should drive. Analgesic/Antipyretic Advice - Acetaminophen: Consider acetaminophen as directed on label or by pharmacist/provider for pain or fever PRECAUTIONS: - Use if there is no history of liver disease, alcoholism, or intake of three or more alcohol drinks per day - Only if approved by provider during pregnancy or when breastfeeding - During pregnancy, acetaminophen should not be taken more than 3 consecutive days without telling provider - Do not exceed recommended dose or frequency ~ Analgesic/Antipyretic Advice - NSAIDs: Consider aspirin, ibuprofen, naproxen or ketoprofen for pain or fever as directed on label or by pharmacist/provider. PRECAUTIONS: - If over 29 years of age, should not take longer than 1 week without consulting provider. EXCEPTIONS: - Should not be used if taking blood thinners or have  bleeding problems. - Do not use if have history of sensitivity/allergy to any of these medications; or history of cardiovascular, ulcer, kidney, liver disease or diabetes unless approved by provider. - Do not exceed recommended dose or frequency. ~

## 2013-06-05 ENCOUNTER — Telehealth: Payer: Self-pay | Admitting: Family Medicine

## 2013-06-05 NOTE — Telephone Encounter (Signed)
Spoke with patient her pain in not as bad and would like appt to be seen tomorrow. Appt made with Dr.Letvak at 4:00pm tomorrow

## 2013-06-05 NOTE — Telephone Encounter (Signed)
Patient Information:  Caller Name: Idolina  Phone: [phone]  Patient: Christine Rice, Christine Rice  Gender: Female  DOB: 12-23-1980  Age: 32 Years  PCP: Tower, [employer] [facility])  Pregnant: No  Office Follow Up:  Does the office need to follow up with this patient?: Yes  Instructions For The Office: HAS ED DISPOSITION- due to severe upper abdominal pain- no appointments left in EPIC- needs work in appointment   Symptoms  Reason For Call & Symptoms: Brisa states she had onset of intermittent upper abdominal pain at 0800.  Rates abdominal pain an 8 on 1/10 pt scale. States she thinks she has an umbilical hernia repair that has failed. Umbilical hernia protruding at times. State she was "Horsing around at home last night." Per upper abdominal pain protocool has ED disposition due to  severe abdominal pain. No appointments left in EPIC. PLEASE CALL Samanvitha AT [phone] for work in appointment  Reviewed Health History In EMR: Yes  Reviewed Medications In EMR: Yes  Reviewed Allergies In EMR: Yes  Reviewed Surgeries / Procedures: Yes  Date of Onset of Symptoms: 06/05/2013 OB / GYN:  LMP: Unknown  Guideline(s) Used:  Abdominal Pain - Upper  Disposition Per Guideline:   Go to Office Now  Reason For Disposition Reached:   Vomiting and abdomen looks much more swollen than usual  Advice Given:  Call Back If:  You become worse.  Reassurance:  A mild stomachache can be from indigestion, stomach irritation, or overeating. Sometimes a stomachache signals the onset of a vomiting illness from a viral infection.  Fluids:   Sip clear fluids only (e.g., water, flat soft drinks, or half-strength fruit juice) until the pain is gone for 2 hours. Then slowly return to a regular diet.  Fluids:    Will Follow Care Advice:  YES

## 2013-06-05 NOTE — Telephone Encounter (Signed)
Agree with disposition. 

## 2013-06-06 ENCOUNTER — Encounter: Payer: Self-pay | Admitting: Radiology

## 2013-06-06 ENCOUNTER — Ambulatory Visit (INDEPENDENT_AMBULATORY_CARE_PROVIDER_SITE_OTHER): Payer: BC Managed Care – PPO | Admitting: Internal Medicine

## 2013-06-06 ENCOUNTER — Encounter: Payer: Self-pay | Admitting: Internal Medicine

## 2013-06-06 VITALS — BP 100/60 | HR 75 | Temp 97.6°F | Wt 134.0 lb

## 2013-06-06 DIAGNOSIS — K429 Umbilical hernia without obstruction or gangrene: Secondary | ICD-10-CM | POA: Insufficient documentation

## 2013-06-06 NOTE — Patient Instructions (Addendum)
Please call if you have ongoing pain or the hernia worsens---we can get you set up with a surgeon

## 2013-06-06 NOTE — Progress Notes (Signed)
  Subjective:    Patient ID: Christine Rice, female    DOB: 06-Apr-1981, 32 y.o.   MRN: [id number]  HPI Is concerned that her hernia has returned Umbilical bulge is now noticeable Bulges if she yells or has increased abdominal pressure Started about 1-2 months ago  Yesterday AM after moving bowels, she had severe pain in right flank and mid abdomen---but not right at umbilicus  Current Outpatient Prescriptions on File Prior to Visit  Medication Sig Dispense Refill  . ALPRAZolam (XANAX) 0.25 MG tablet Take 1 tablet (0.25 mg total) by mouth 2 (two) times daily as needed for sleep or anxiety.  30 tablet  0  . PRENATAL VITAMINS PO Take by mouth daily.         No current facility-administered medications on file prior to visit.    Allergies  Allergen Reactions  . Amoxicillin Hives    REACTION: rash    Past Medical History  Diagnosis Date  . Anxiety     Past Surgical History  Procedure Laterality Date  . Cesarean section  01/06/05  . Cesarean section  01/19/2012    Procedure: CESAREAN SECTION;  Surgeon: Turner Daniels, MD;  Location: [facility];  Service: Gynecology;  Laterality: N/A;  repeat  . Inguinal hernia repair  01/19/2012    Procedure: HERNIA REPAIR INGUINAL ADULT BILATERAL;  Surgeon: Ardeth Sportsman, MD;  Location: [facility];  Service: General;  Laterality: Bilateral;  . Umbilical hernia repair  01/19/2012    Procedure: HERNIA REPAIR UMBILICAL ADULT;  Surgeon: Ardeth Sportsman, MD;  Location: [facility];  Service: General;  Laterality: N/A;    No family history on file.  History   Social History  . Marital Status: Legally Separated    Spouse Name: N/A    Number of Children: 1  . Years of Education: N/A   Occupational History  . 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 Smoking status: Current Every Day Smoker -- 0.25 packs/day  . Smokeless tobacco: Never Used  . Alcohol Use: No  . Drug Use: No  . Sexually Active:    Other Topics  Concern  . Not on file   Social History Narrative  . No narrative on file   Review of Systems Appetite is down for the summer Weight is up slightly in the past few months    Objective:   Physical Exam  Constitutional: She appears well-developed and well-nourished. No distress.  Abdominal: She exhibits no distension. There is no tenderness. There is no rebound and no guarding.  Tiny protrusion at umbilicus (1cm or so) Rectus muscles are lined up well Not really tender even when protruding          Assessment & Plan:

## 2013-06-06 NOTE — Assessment & Plan Note (Signed)
Tiny recurrent hernia at umbilicus Doesn't look like it is bowel though (?omentum) Pain was not at site yesterday so doubt it was incarcerated  Will just observe Discussed safe lifting Surgical reeval if worsens (wants [facility])

## 2013-11-19 ENCOUNTER — Other Ambulatory Visit: Payer: Self-pay | Admitting: Family Medicine

## 2013-11-19 MED ORDER — ALPRAZOLAM 0.25 MG PO TABS: 0.2500 mg | ORAL_TABLET | Freq: Two times a day (BID) | ORAL | Status: DC | PRN

## 2013-11-19 NOTE — Telephone Encounter (Signed)
Rx called in as prescribed an f/u appt scheduled for tomorrow 11/20/13 per pt request

## 2013-11-19 NOTE — Telephone Encounter (Signed)
Px written for call in   Please follow up when she can

## 2013-11-19 NOTE — Telephone Encounter (Signed)
Last filled 03/18/2013.

## 2013-11-20 ENCOUNTER — Ambulatory Visit: Payer: BC Managed Care – PPO | Admitting: Family Medicine

## 2013-12-02 ENCOUNTER — Encounter: Payer: Self-pay | Admitting: Family Medicine

## 2013-12-02 ENCOUNTER — Ambulatory Visit (INDEPENDENT_AMBULATORY_CARE_PROVIDER_SITE_OTHER): Payer: BC Managed Care – PPO | Admitting: Family Medicine

## 2013-12-02 VITALS — BP 110/70 | HR 63 | Temp 98.4°F | Ht 66.0 in | Wt 133.5 lb

## 2013-12-02 DIAGNOSIS — Z23 Encounter for immunization: Secondary | ICD-10-CM

## 2013-12-02 DIAGNOSIS — F411 Generalized anxiety disorder: Secondary | ICD-10-CM

## 2013-12-02 MED ORDER — MIRTAZAPINE 15 MG PO TABS: 15.0000 mg | ORAL_TABLET | Freq: Every day | ORAL | Status: DC

## 2013-12-02 NOTE — Assessment & Plan Note (Signed)
With stressors incl legal trouble and prospect of loosing job  Pt is using xanax several times per day and does not want to become dependent on it  Will px mirtazapine 15 mg qhs which was helpful in the past for mood disorder / sleep and appetite  F/u planned  Goal - to minimize need for xanax  Reviewed stressors/ coping techniques/symptoms/ support sources/ tx options and side effects in detail today

## 2013-12-02 NOTE — Progress Notes (Signed)
Pre-visit discussion using our clinic review tool. No additional management support is needed unless otherwise documented below in the visit note.  

## 2013-12-02 NOTE — Patient Instructions (Signed)
Start the remeron 15 mg at bedtime - to help with anxiety  Use the xanax as minimally as possible  Keep thinking about quitting smoking  Flu shot today  Follow up with me in about 3 months

## 2013-12-02 NOTE — Progress Notes (Signed)
Subjective:    Patient ID: Christine Rice, female    DOB: Apr 11, 1981, 32 y.o.   MRN: [id number]  HPI Having stress / and was suspended from work  Legal issues  More problems with anxiety - had to take xanax again   This has decreased her appetite and not slept well  Feels like her world has been turned upside down  Feeling physically good  Keeping weight stable Is able to donate plasma  Also joined a gym- working out a bit   She has used xanax 2-3 times per day right now due to anxiety - she has never used this much In the past - remeron works fairly well for her -she is interested in that   Seeing her preacher for support and counseling  Also has appt upcoming for a prof counseling appt - that is already scheduled  Good church support and friend support     She does smoke marijuana   Has had repair of umbilical hernia and she thinks it is coming back - but not painful  Patient Active Problem List   Diagnosis Date Noted  . Umbilical hernia 06/06/2013  . Cesarean delivery delivered 01/19/2012  . ABSCESS, TOOTH 10/10/2009  . IBS 08/15/2007  . 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 ANXIETY 08/14/2007  . DEPRESSION 08/14/2007  . ADD 08/14/2007   Past Medical History  Diagnosis Date  . Anxiety    Past Surgical History  Procedure Laterality Date  . Cesarean section  01/06/05  . Cesarean section  01/19/2012    Procedure: CESAREAN SECTION;  Surgeon: Turner Daniels, MD;  Location: [facility];  Service: Gynecology;  Laterality: N/A;  repeat  . Inguinal hernia repair  01/19/2012    Procedure: HERNIA REPAIR INGUINAL ADULT BILATERAL;  Surgeon: Ardeth Sportsman, MD;  Location: [facility];  Service: General;  Laterality: Bilateral;  . Umbilical hernia repair  01/19/2012    Procedure: HERNIA REPAIR UMBILICAL ADULT;  Surgeon: Ardeth Sportsman, MD;  Location: [facility];  Service: General;  Laterality: N/A;   History  Substance Use Topics  . Smoking status: Current Every Day Smoker -- 0.50 packs/day  . Smokeless  tobacco: Never Used  . Alcohol Use: Yes     Comment: occ/socially   No family history on file. Allergies  Allergen Reactions  . Amoxicillin Hives    REACTION: rash   Current Outpatient Prescriptions on File Prior to Visit  Medication Sig Dispense Refill  . ALPRAZolam (XANAX) 0.25 MG tablet Take 1 tablet (0.25 mg total) by mouth 2 (two) times daily as needed for sleep or anxiety.  30 tablet  0  . PRENATAL VITAMINS PO Take by mouth daily.         No current facility-administered medications on file prior to visit.     Review of Systems Review of Systems  Constitutional: Negative for fever, fatigue and unexpected weight change. pos for decreased appetite  Eyes: Negative for pain and visual disturbance.  Respiratory: Negative for cough and shortness of breath.   Cardiovascular: Negative for cp or palpitations    Gastrointestinal: Negative for nausea, diarrhea and constipation.  Genitourinary: Negative for urgency and frequency.  Skin: Negative for pallor or rash   Neurological: Negative for weakness, light-headedness, numbness and headaches.  Hematological: Negative for adenopathy. Does not bruise/bleed easily.  Psychiatric/Behavioral:pos for dysphoric mood and anxiety       Objective:   Physical Exam  Constitutional: She appears well-developed and well-nourished. No distress.  Slim and well  appearing   HENT:  Head: Normocephalic and atraumatic.  Eyes: Conjunctivae and EOM are normal. Pupils are equal, round, and reactive to light. Right eye exhibits no discharge. Left eye exhibits no discharge. No scleral icterus.  Neck: Normal range of motion. Neck supple. No thyromegaly present.  Cardiovascular: Normal rate and regular rhythm.   Pulmonary/Chest: Effort normal and breath sounds normal. No respiratory distress. She has no wheezes. She has no rales.  Musculoskeletal: She exhibits no edema.  Lymphadenopathy:    She has no cervical adenopathy.  Neurological: She is alert. She  has normal reflexes. She displays no tremor. No cranial nerve deficit. She exhibits normal muscle tone. Coordination normal.  Skin: Skin is warm and dry. No rash noted. No erythema. No pallor.  Psychiatric: Thought content normal. Her mood appears anxious. Her affect is not blunt, not labile and not inappropriate. Her speech is not rapid and/or pressured. She is not agitated. Thought content is not paranoid. Cognition and memory are normal. She does not exhibit a depressed mood. She expresses no homicidal and no suicidal ideation.          Assessment & Plan:

## 2014-01-29 ENCOUNTER — Encounter: Payer: Self-pay | Admitting: Family Medicine

## 2014-01-29 ENCOUNTER — Ambulatory Visit (INDEPENDENT_AMBULATORY_CARE_PROVIDER_SITE_OTHER): Payer: BC Managed Care – PPO | Admitting: Family Medicine

## 2014-01-29 VITALS — BP 124/62 | HR 108 | Temp 98.5°F | Ht 66.0 in | Wt 142.1 lb

## 2014-01-29 DIAGNOSIS — J020 Streptococcal pharyngitis: Secondary | ICD-10-CM

## 2014-01-29 DIAGNOSIS — J029 Acute pharyngitis, unspecified: Secondary | ICD-10-CM

## 2014-01-29 LAB — POCT RAPID STREP A (OFFICE): RAPID STREP A SCREEN: POSITIVE — AB

## 2014-01-29 MED ORDER — AZITHROMYCIN 250 MG PO TABS
ORAL_TABLET | ORAL | Status: DC
Start: 2014-01-29 — End: 2014-04-23

## 2014-01-29 NOTE — Patient Instructions (Signed)
Drink lots of fluids and rest  Take the zithromax as directed  Update if not starting to improve in a week or if worsening    Strep Throat Strep throat is an infection of the throat caused by a bacteria named Streptococcus pyogenes. Your caregiver may call the infection streptococcal "tonsillitis" or "pharyngitis" depending on whether there are signs of inflammation in the tonsils or back of the throat. Strep throat is most common in children aged 33 15 years during the cold months of the year, but it can occur in people of any age during any season. This infection is spread from person to person (contagious) through coughing, sneezing, or other close contact. SYMPTOMS   Fever or chills.  Painful, swollen, red tonsils or throat.  Pain or difficulty when swallowing.  White or yellow spots on the tonsils or throat.  Swollen, tender lymph nodes or "glands" of the neck or under the jaw.  Red rash all over the body (rare). DIAGNOSIS  Many different infections can cause the same symptoms. A test must be done to confirm the diagnosis so the right treatment can be given. A "rapid strep test" can help your caregiver make the diagnosis in a few minutes. If this test is not available, a light swab of the infected area can be used for a throat culture test. If a throat culture test is done, results are usually available in a day or two. TREATMENT  Strep throat is treated with antibiotic medicine. HOME CARE INSTRUCTIONS   Gargle with 1 tsp of salt in 1 cup of warm water, 3 4 times per day or as needed for comfort.  Family members who also have a sore throat or fever should be tested for strep throat and treated with antibiotics if they have the strep infection.  Make sure everyone in your household washes their hands well.  Do not share food, drinking cups, or personal items that could cause the infection to spread to others.  You may need to eat a soft food diet until your sore throat gets  better.  Drink enough water and fluids to keep your urine clear or pale yellow. This will help prevent dehydration.  Get plenty of rest.  Stay home from school, daycare, or work until you have been on antibiotics for 24 hours.  Only take over-the-counter or prescription medicines for pain, discomfort, or fever as directed by your caregiver.  If antibiotics are prescribed, take them as directed. Finish them even if you start to feel better. SEEK MEDICAL CARE IF:   The glands in your neck continue to enlarge.  You develop a rash, cough, or earache.  You cough up green, yellow-brown, or bloody sputum.  You have pain or discomfort not controlled by medicines.  Your problems seem to be getting worse rather than better. SEEK IMMEDIATE MEDICAL CARE IF:   You develop any new symptoms such as vomiting, severe headache, stiff or painful neck, chest pain, shortness of breath, or trouble swallowing.  You develop severe throat pain, drooling, or changes in your voice.  You develop swelling of the neck, or the skin on the neck becomes red and tender.  You have a fever.  You develop signs of dehydration, such as fatigue, dry mouth, and decreased urination.  You become increasingly sleepy, or you cannot wake up completely. Document Released: 11/25/2000 Document Revised: 11/14/2012 Document Reviewed: 01/27/2011 [facility] Patient Information 2014 [place], [place].

## 2014-01-29 NOTE — Assessment & Plan Note (Signed)
pcn allergic-will tx with zithromax  Fluids/ rest/ Disc symptomatic care - see instructions on AVS  Update if not starting to improve in a week or if worsening

## 2014-01-29 NOTE — Progress Notes (Signed)
Subjective:    Patient ID: Christine Rice, female    DOB: 1981-09-08, 33 y.o.   MRN: [id number]  HPI Here for ST  Started with dry throat in am - then painful as the day went on -- and chills and sweats and body aches (suspects she had a fever at home)  Daughter has a sore throat also and fever    Has not been exposed to strep  Some green nasal discharge   No rash   Patient Active Problem List   Diagnosis Date Noted  . Umbilical hernia 65/68/1275  . Cesarean delivery delivered 01/19/2012  . ABSCESS, TOOTH 10/10/2009  . IBS 08/15/2007  . 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 ANXIETY 08/14/2007  . DEPRESSION 08/14/2007  . ADD 08/14/2007   Past Medical History  Diagnosis Date  . Anxiety    Past Surgical History  Procedure Laterality Date  . Cesarean section  01/06/05  . Cesarean section  01/19/2012    Procedure: CESAREAN SECTION;  Surgeon: Luz Lex, MD;  Location: [facility];  Service: Gynecology;  Laterality: N/A;  repeat  . Inguinal hernia repair  01/19/2012    Procedure: HERNIA REPAIR INGUINAL ADULT BILATERAL;  Surgeon: Adin Hector, MD;  Location: [facility];  Service: General;  Laterality: Bilateral;  . Umbilical hernia repair  01/19/2012    Procedure: HERNIA REPAIR UMBILICAL ADULT;  Surgeon: Adin Hector, MD;  Location: [facility];  Service: General;  Laterality: N/A;   History  Substance Use Topics  . Smoking status: Current Every Day Smoker -- 0.50 packs/day  . Smokeless tobacco: Never Used  . Alcohol Use: Yes     Comment: occ/socially   No family history on file. Allergies  Allergen Reactions  . Amoxicillin Hives    REACTION: rash   Current Outpatient Prescriptions on File Prior to Visit  Medication Sig Dispense Refill  . ALPRAZolam (XANAX) 0.25 MG tablet Take 1 tablet (0.25 mg total) by mouth 2 (two) times daily as needed for sleep or anxiety.  30 tablet  0  . mirtazapine (REMERON) 15 MG tablet Take 1 tablet (15 mg total) by mouth at bedtime.  30 tablet  5  . PRENATAL VITAMINS  PO Take by mouth daily.         No current facility-administered medications on file prior to visit.      Review of Systems Review of Systems  Constitutional: Negative for  appetite change,  and unexpected weight change. pos for fatigue and malaise ENT pos for congestion at times and post nasal drip/ pos for ST/ neg for sinus pain  Eyes: Negative for pain and visual disturbance.  Respiratory: Negative for cough and shortness of breath. Neg for wheeze   Cardiovascular: Negative for cp or palpitations    Gastrointestinal: Negative for nausea, diarrhea and constipation.  Genitourinary: Negative for urgency and frequency.  Skin: Negative for pallor or rash   Neurological: Negative for weakness, light-headedness, numbness and headaches.  Hematological: Negative for adenopathy. Does not bruise/bleed easily.  Psychiatric/Behavioral: Negative for dysphoric mood. The patient is not nervous/anxious.         Objective:   Physical Exam  Constitutional: She appears well-developed and well-nourished. No distress.  HENT:  Head: Normocephalic and atraumatic.  Right Ear: External ear normal.  Left Ear: External ear normal.  Mouth/Throat: No oropharyngeal exudate.  Nares are boggy with clear rhinorrhea  Throat -diffuse erythema / no swelling or exudate  No sinus tenderness  Eyes: Conjunctivae and EOM are normal.  Pupils are equal, round, and reactive to light. Right eye exhibits no discharge. Left eye exhibits no discharge.  Neck: Normal range of motion. Neck supple. No thyromegaly present.  Few shotty anterior cervical LN-non tender  Cardiovascular: Regular rhythm and normal heart sounds.   Pulmonary/Chest: Effort normal and breath sounds normal. No respiratory distress. She has no wheezes. She has no rales.  Diffusely distant bs   Musculoskeletal: She exhibits no edema.  Lymphadenopathy:    She has cervical adenopathy.  Neurological: She is alert.  Skin: Skin is warm and dry. No rash noted.   Psychiatric: She has a normal mood and affect.          Assessment & Plan:

## 2014-01-29 NOTE — Progress Notes (Signed)
Pre-visit discussion using our clinic review tool. No additional management support is needed unless otherwise documented below in the visit note.  

## 2014-01-30 ENCOUNTER — Telehealth: Payer: Self-pay | Admitting: Family Medicine

## 2014-01-30 NOTE — Telephone Encounter (Signed)
Relevant patient education assigned to patient using Emmi. ° °

## 2014-02-25 ENCOUNTER — Other Ambulatory Visit: Payer: Self-pay | Admitting: Family Medicine

## 2014-02-25 NOTE — Telephone Encounter (Signed)
Electronic refill request, please advise  

## 2014-02-25 NOTE — Telephone Encounter (Signed)
Px written for call in   

## 2014-02-26 NOTE — Telephone Encounter (Signed)
Rx called in as prescribed 

## 2014-04-18 ENCOUNTER — Telehealth: Payer: Self-pay | Admitting: Family Medicine

## 2014-04-18 NOTE — Telephone Encounter (Signed)
Patient hasn't had a physical in several years.  She's losing her insurance mid-June and she wants to know, if you can see her for her physical before her insurance runs out. She's,also,having knee pain. Please advise.

## 2014-04-18 NOTE — Telephone Encounter (Signed)
The knee pain and physical have to be separate visits (insurance wise)- can put in any 30 min slot for PE

## 2014-04-23 ENCOUNTER — Ambulatory Visit (INDEPENDENT_AMBULATORY_CARE_PROVIDER_SITE_OTHER): Payer: BC Managed Care – PPO | Admitting: Family Medicine

## 2014-04-23 ENCOUNTER — Encounter: Payer: Self-pay | Admitting: Family Medicine

## 2014-04-23 ENCOUNTER — Encounter: Payer: Self-pay | Admitting: *Deleted

## 2014-04-23 VITALS — BP 108/64 | HR 76 | Temp 98.1°F | Ht 66.0 in | Wt 143.5 lb

## 2014-04-23 DIAGNOSIS — M25562 Pain in left knee: Secondary | ICD-10-CM

## 2014-04-23 DIAGNOSIS — M25569 Pain in unspecified knee: Secondary | ICD-10-CM

## 2014-04-23 MED ORDER — ALPRAZOLAM 0.25 MG PO TABS: ORAL_TABLET | ORAL | Status: DC

## 2014-04-23 MED ORDER — MELOXICAM 15 MG PO TABS: 15.0000 mg | ORAL_TABLET | Freq: Every day | ORAL | Status: DC

## 2014-04-23 MED ORDER — MIRTAZAPINE 15 MG PO TABS: 15.0000 mg | ORAL_TABLET | Freq: Every day | ORAL | Status: DC

## 2014-04-23 NOTE — Progress Notes (Signed)
Subjective:    Patient ID: Christine Rice, female    DOB: November 08, 1981, 33 y.o.   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 Strep pharyngitis 01/29/2014  . Umbilical hernia 10/08/2535  . Cesarean delivery delivered 01/19/2012  . IBS 08/15/2007  . 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 ANXIETY 08/14/2007  . DEPRESSION 08/14/2007  . ADD 08/14/2007   Past Medical History  Diagnosis Date  . Anxiety    Past Surgical History  Procedure Laterality Date  . Cesarean section  01/06/05  . Cesarean section  01/19/2012    Procedure: CESAREAN SECTION;  Surgeon: Luz Lex, MD;  Location: [facility];  Service: Gynecology;  Laterality: N/A;  repeat  . Inguinal hernia repair  01/19/2012    Procedure: HERNIA REPAIR INGUINAL ADULT BILATERAL;  Surgeon: Adin Hector, MD;  Location: [facility];  Service: General;  Laterality: Bilateral;  . Umbilical hernia repair  01/19/2012    Procedure: HERNIA REPAIR UMBILICAL ADULT;  Surgeon: Adin Hector, MD;  Location: [facility];  Service: General;  Laterality: N/A;   History  Substance Use Topics  . Smoking status: Current Every Day Smoker -- 0.10 packs/day    Types: Cigarettes  . Smokeless tobacco: Never Used  . Alcohol Use: Yes     Comment: occ/socially   No family history on file. Allergies    Allergen Reactions  . Amoxicillin Hives    REACTION: rash   Current Outpatient Prescriptions on File Prior to Visit  Medication Sig Dispense Refill  . ALPRAZolam (XANAX) 0.25 MG tablet TAKE 1 TABLET BY MOUTH TWICE A DAY AS NEEDED FOR ANXIETY OR SLEEP  30 tablet  0  . mirtazapine (REMERON) 15 MG tablet Take 1 tablet (15 mg total) by mouth at bedtime.  30 tablet  5  . PRENATAL VITAMINS PO Take by mouth daily.         No current facility-administered medications on file prior to visit.     Review of Systems Review of Systems  Constitutional: Negative for fever, appetite change, fatigue and unexpected weight change.  Eyes: Negative for pain and visual disturbance.  Respiratory: Negative for cough and shortness of breath.   Cardiovascular: Negative for cp or palpitations    Gastrointestinal: Negative for nausea, diarrhea and constipation.  Genitourinary: Negative for urgency and frequency.  Skin: Negative for pallor or rash  MSK pos for knee pain   Neurological: Negative for weakness, light-headedness, numbness and headaches.  Hematological: Negative for adenopathy. Does not bruise/bleed easily.  Psychiatric/Behavioral: Negative for dysphoric mood. The patient is not nervous/anxious.         Objective:   Physical  Exam  Constitutional: She appears well-developed and well-nourished. No distress.  HENT:  Head: Normocephalic and atraumatic.  Eyes: Conjunctivae and EOM are normal. Pupils are equal, round, and reactive to light.  Neck: Normal range of motion. Neck supple.  Cardiovascular: Normal rate and regular rhythm.   Pulmonary/Chest: Effort normal and breath sounds normal.  Musculoskeletal: She exhibits tenderness. She exhibits no edema.       Left knee: She exhibits decreased range of motion. She exhibits no swelling, no effusion, no ecchymosis, no erythema, normal alignment, no LCL laxity, normal patellar mobility, no bony tenderness, normal meniscus and no MCL laxity. Tenderness  found. Lateral joint line tenderness noted.  Tender over patellofemoral tendon and also lat joint line  Pain with flex past 90 deg Neg mcmurray, neg drawer and lachman Nl gait No neuro changes   Lymphadenopathy:    She has no cervical adenopathy.  Neurological: She is alert. She has normal reflexes.  Skin: Skin is warm and dry. No rash noted. No pallor.  Psychiatric: She has a normal mood and affect.          Assessment & Plan:

## 2014-04-23 NOTE — Patient Instructions (Signed)
Use ice on your knee before and after exercise and whenever you can  Try meloxicam 15 mg with a meal once daily for pain and inflammation Get a neoprene knee sleeve or patellar stabilizing band to wear during athletics or prolonged standing  You can look at a drug store or sporting good store If not improved after 2 weeks make an appt with Dr Lorelei Pont

## 2014-04-23 NOTE — Progress Notes (Signed)
Pre visit review using our clinic review tool, if applicable. No additional management support is needed unless otherwise documented below in the visit note. 

## 2014-04-24 NOTE — Assessment & Plan Note (Signed)
Suspect patellofemoral syndrome - from recent overuse/ softball  Disc use of ice/ and knee sleeve or band to stabilize patella due to athletics  Also nsaid  Update if worse or not improved in 2 weeks - may consider f/u with Dr Lorelei Pont or consideration of PT

## 2014-05-16 ENCOUNTER — Encounter: Payer: Self-pay | Admitting: Family Medicine

## 2014-07-07 ENCOUNTER — Telehealth: Payer: Self-pay | Admitting: Family Medicine

## 2014-07-07 DIAGNOSIS — Z0000 Encounter for general adult medical examination without abnormal findings: Secondary | ICD-10-CM | POA: Insufficient documentation

## 2014-07-07 NOTE — Telephone Encounter (Signed)
Message copied by Abner Greenspan on Mon Jul 07, 2014 10:09 PM ------      Message from: Ellamae Sia      Created: Wed Jul 02, 2014  3:24 PM      Regarding: Lab orders for Tuesday, 7.28.15       Patient is scheduled for CPX labs, please order future labs, Thanks , Terri       ------

## 2014-07-08 ENCOUNTER — Other Ambulatory Visit (INDEPENDENT_AMBULATORY_CARE_PROVIDER_SITE_OTHER): Payer: BC Managed Care – PPO

## 2014-07-08 DIAGNOSIS — Z0000 Encounter for general adult medical examination without abnormal findings: Secondary | ICD-10-CM

## 2014-07-08 LAB — COMPREHENSIVE METABOLIC PANEL
ALBUMIN: 3.5 g/dL (ref 3.5–5.2)
ALT: 11 U/L (ref 0–35)
AST: 16 U/L (ref 0–37)
Alkaline Phosphatase: 53 U/L (ref 39–117)
BUN: 8 mg/dL (ref 6–23)
CALCIUM: 8.7 mg/dL (ref 8.4–10.5)
CHLORIDE: 106 meq/L (ref 96–112)
CO2: 28 mEq/L (ref 19–32)
Creatinine, Ser: 0.7 mg/dL (ref 0.4–1.2)
GFR: 102.3 mL/min (ref >60.00)
Glucose, Bld: 91 mg/dL (ref 70–99)
POTASSIUM: 4 meq/L (ref 3.5–5.1)
Sodium: 137 mEq/L (ref 135–145)
Total Bilirubin: 0.6 mg/dL (ref 0.2–1.2)
Total Protein: 6.6 g/dL (ref 6.0–8.3)

## 2014-07-08 LAB — LIPID PANEL
CHOL/HDL RATIO: 4
Cholesterol: 160 mg/dL (ref 0–200)
HDL: 38.4 mg/dL — ABNORMAL LOW (ref >39.00)
LDL Cholesterol: 106 mg/dL — ABNORMAL HIGH (ref 0–99)
NONHDL: 121.6
Triglycerides: 76 mg/dL (ref 0.0–149.0)
VLDL: 15.2 mg/dL (ref 0.0–40.0)

## 2014-07-08 LAB — CBC WITH DIFFERENTIAL/PLATELET
BASOS ABS: 0.1 10*3/uL (ref 0.0–0.1)
BASOS PCT: 0.4 % (ref 0.0–3.0)
EOS ABS: 0.1 10*3/uL (ref 0.0–0.7)
Eosinophils Relative: 0.9 % (ref 0.0–5.0)
HCT: 42.5 % (ref 36.0–46.0)
Hemoglobin: 14.4 g/dL (ref 12.0–15.0)
LYMPHS PCT: 14.5 % (ref 12.0–46.0)
Lymphs Abs: 2 10*3/uL (ref 0.7–4.0)
MCHC: 33.9 g/dL (ref 30.0–36.0)
MCV: 92.1 fl (ref 78.0–100.0)
MONOS PCT: 5.6 % (ref 3.0–12.0)
Monocytes Absolute: 0.8 10*3/uL (ref 0.1–1.0)
NEUTROS PCT: 78.6 % — AB (ref 43.0–77.0)
Neutro Abs: 10.7 10*3/uL — ABNORMAL HIGH (ref 1.4–7.7)
Platelets: 225 10*3/uL (ref 150.0–400.0)
RBC: 4.61 Mil/uL (ref 3.87–5.11)
RDW: 13.1 % (ref 11.5–15.5)
WBC: 13.6 10*3/uL — ABNORMAL HIGH (ref 4.0–10.5)

## 2014-07-08 LAB — TSH: TSH: 0.67 u[IU]/mL (ref 0.35–4.50)

## 2014-07-10 DIAGNOSIS — F1510 Other stimulant abuse, uncomplicated: Secondary | ICD-10-CM | POA: Insufficient documentation

## 2014-07-14 ENCOUNTER — Telehealth: Payer: Self-pay | Admitting: Family Medicine

## 2014-07-14 ENCOUNTER — Ambulatory Visit (INDEPENDENT_AMBULATORY_CARE_PROVIDER_SITE_OTHER): Payer: BC Managed Care – PPO | Admitting: Family Medicine

## 2014-07-14 ENCOUNTER — Encounter: Payer: Self-pay | Admitting: Family Medicine

## 2014-07-14 VITALS — BP 104/64 | HR 79 | Temp 98.7°F | Ht 65.25 in | Wt 145.0 lb

## 2014-07-14 DIAGNOSIS — Z0000 Encounter for general adult medical examination without abnormal findings: Secondary | ICD-10-CM

## 2014-07-14 DIAGNOSIS — Z87891 Personal history of nicotine dependence: Secondary | ICD-10-CM | POA: Insufficient documentation

## 2014-07-14 DIAGNOSIS — F17200 Nicotine dependence, unspecified, uncomplicated: Secondary | ICD-10-CM

## 2014-07-14 DIAGNOSIS — Z23 Encounter for immunization: Secondary | ICD-10-CM

## 2014-07-14 DIAGNOSIS — K429 Umbilical hernia without obstruction or gangrene: Secondary | ICD-10-CM

## 2014-07-14 NOTE — Assessment & Plan Note (Signed)
Disc in detail risks of smoking and possible outcomes including copd, vascular/ heart disease, cancer , respiratory and sinus infections  Pt voices understanding Pt has cut back to 1/2 ppd but states she is not ready to quit

## 2014-07-14 NOTE — Progress Notes (Signed)
Subjective:    Patient ID: Christine Rice, female    DOB: 24-Nov-1981, 33 y.o.   MRN: [id number]  HPI Here for health maintenance exam and to review chronic medical problems    Wt is up 2 lb with bmi of 23.9  Td 2000- wants to get Tdap  Pap- had recent pap - it was normal with gyn  5/15  No problems  No breast lumps on self exam    Flu vaccine 12/14  Her hernia (umbilical) is getting bigger and starting to bother her  She may have to have it repaired again  No pain - just uncomfortable to stretch /had to take her belly ring out   Is doing great with the mirtazapine 15 mg - and she has been able to gain and maintain weight  She wants to get off of it eventually - not until stressors calm down   No more knee problems since softball ended   Smoking status - 1/2 ppd  She has a vapor cig - she is experimenting with that to use it to quit   She was sick 2 weeks ago - had uri symptoms with a bad cough- a lot better now   Results for orders placed in visit on 07/08/14  CBC WITH DIFFERENTIAL      Result Value Ref Range   WBC 13.6 (*) 4.0 - 10.5 K/uL   RBC 4.61  3.87 - 5.11 Mil/uL   Hemoglobin 14.4  12.0 - 15.0 g/dL   HCT 42.5  36.0 - 46.0 %   MCV 92.1  78.0 - 100.0 fl   MCHC 33.9  30.0 - 36.0 g/dL   RDW 13.1  11.5 - 15.5 %   Platelets 225.0  150.0 - 400.0 K/uL   Neutrophils Relative % 78.6 (*) 43.0 - 77.0 %   Lymphocytes Relative 14.5  12.0 - 46.0 %   Monocytes Relative 5.6  3.0 - 12.0 %   Eosinophils Relative 0.9  0.0 - 5.0 %   Basophils Relative 0.4  0.0 - 3.0 %   Neutro Abs 10.7 (*) 1.4 - 7.7 K/uL   Lymphs Abs 2.0  0.7 - 4.0 K/uL   Monocytes Absolute 0.8  0.1 - 1.0 K/uL   Eosinophils Absolute 0.1  0.0 - 0.7 K/uL   Basophils Absolute 0.1  0.0 - 0.1 K/uL  COMPREHENSIVE METABOLIC PANEL      Result Value Ref Range   Sodium 137  135 - 145 mEq/L   Potassium 4.0  3.5 - 5.1 mEq/L   Chloride 106  96 - 112 mEq/L   CO2 28  19 - 32 mEq/L   Glucose, Bld 91  70 - 99 mg/dL   BUN 8  6 - 23 mg/dL   Creatinine, Ser 0.7  0.4 - 1.2 mg/dL   Total Bilirubin 0.6  0.2 - 1.2 mg/dL   Alkaline Phosphatase 53  39 - 117 U/L   AST 16  0 - 37 U/L   ALT 11  0 - 35 U/L   Total Protein 6.6  6.0 - 8.3 g/dL   Albumin 3.5  3.5 - 5.2 g/dL   Calcium 8.7  8.4 - 10.5 mg/dL   GFR 102.30  >60.00 mL/min  LIPID PANEL      Result Value Ref Range   Cholesterol 160  0 - 200 mg/dL   Triglycerides 76.0  0.0 - 149.0 mg/dL   HDL 38.40 (*) >39.00 mg/dL   VLDL 15.2  0.0 - 40.0  mg/dL   LDL Cholesterol 106 (*) 0 - 99 mg/dL   Total CHOL/HDL Ratio 4     NonHDL 121.60    TSH      Result Value Ref Range   TSH 0.67  0.35 - 4.50 uIU/mL     Patient Active Problem List   Diagnosis Date Noted  . Routine general medical examination at a health care facility 07/07/2014  . Left knee pain 04/23/2014  . Umbilical hernia 93/23/5573  . Cesarean delivery delivered 01/19/2012  . IBS 08/15/2007  . 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 ANXIETY 08/14/2007  . DEPRESSION 08/14/2007  . ADD 08/14/2007   Past Medical History  Diagnosis Date  . Anxiety    Past Surgical History  Procedure Laterality Date  . Cesarean section  01/06/05  . Cesarean section  01/19/2012    Procedure: CESAREAN SECTION;  Surgeon: Luz Lex, MD;  Location: [facility];  Service: Gynecology;  Laterality: N/A;  repeat  . Inguinal hernia repair  01/19/2012    Procedure: HERNIA REPAIR INGUINAL ADULT BILATERAL;  Surgeon: Adin Hector, MD;  Location: [facility];  Service: General;  Laterality: Bilateral;  . Umbilical hernia repair  01/19/2012    Procedure: HERNIA REPAIR UMBILICAL ADULT;  Surgeon: Adin Hector, MD;  Location: [facility];  Service: General;  Laterality: N/A;   History  Substance Use Topics  . Smoking status: Current Every Day Smoker -- 0.10 packs/day    Types: Cigarettes  . Smokeless tobacco: Never Used  . Alcohol Use: Yes     Comment: occ/socially   No family history on file. Allergies  Allergen Reactions  . Amoxicillin Hives    REACTION:  rash   Current Outpatient Prescriptions on File Prior to Visit  Medication Sig Dispense Refill  . ALPRAZolam (XANAX) 0.25 MG tablet TAKE 1 TABLET BY MOUTH TWICE A DAY AS NEEDED FOR ANXIETY OR SLEEP  30 tablet  0  . mirtazapine (REMERON) 15 MG tablet Take 1 tablet (15 mg total) by mouth at bedtime.  30 tablet  11  . PRENATAL VITAMINS PO Take by mouth daily.         No current facility-administered medications on file prior to visit.      Review of Systems Review of Systems  Constitutional: Negative for fever, appetite change, fatigue and unexpected weight change.  Eyes: Negative for pain and visual disturbance.  Respiratory: Negative for cough and shortness of breath.   Cardiovascular: Negative for cp or palpitations    Gastrointestinal: Negative for nausea, diarrhea and constipation.  Genitourinary: Negative for urgency and frequency.  Skin: Negative for pallor or rash   Neurological: Negative for weakness, light-headedness, numbness and headaches.  Hematological: Negative for adenopathy. Does not bruise/bleed easily.  Psychiatric/Behavioral: Negative for dysphoric mood. The patient is not nervous/anxious.  (improved mood on remeron)       Objective:   Physical Exam  Constitutional: She appears well-developed and well-nourished. No distress.  HENT:  Head: Normocephalic and atraumatic.  Right Ear: External ear normal.  Left Ear: External ear normal.  Nose: Nose normal.  Mouth/Throat: Oropharynx is clear and moist.  Eyes: Conjunctivae and EOM are normal. Pupils are equal, round, and reactive to light. Right eye exhibits no discharge. Left eye exhibits no discharge. No scleral icterus.  Neck: Normal range of motion. Neck supple. No JVD present. Carotid bruit is not present. No thyromegaly present.  Cardiovascular: Normal rate, regular rhythm, normal heart sounds and intact distal pulses.  Exam reveals no gallop.  Pulmonary/Chest: Effort normal and breath sounds normal. No  respiratory distress. She has no wheezes. She has no rales.  Diffusely distant bs  No wheeze   Abdominal: Soft. Bowel sounds are normal. She exhibits no distension and no mass. There is no tenderness.  Small umbilical hernia-soft and reducible  No tenderness  Musculoskeletal: She exhibits no edema and no tenderness.  Lymphadenopathy:    She has no cervical adenopathy.  Neurological: She is alert. She has normal reflexes. No cranial nerve deficit. She exhibits normal muscle tone. Coordination normal.  Skin: Skin is warm and dry. No rash noted. No erythema. No pallor.  Psychiatric: She has a normal mood and affect.  Pleasant/ talkative and attentive           Assessment & Plan:   Problem List Items Addressed This Visit     Other   Umbilical hernia     Will continue to observe- bothersome but not painful and is reducible  Still small  Will update me if worse -would consider a surgical opinion (has had surgery once for this)     Routine general medical examination at a health care facility - Primary     Reviewed health habits including diet and exercise and skin cancer prevention Reviewed appropriate screening tests for age  Also reviewed health mt list, fam hx and immunization status , as well as social and family history    See HPI Labs reviewed Tdap and pneumovax today     Smoking     Disc in detail risks of smoking and possible outcomes including copd, vascular/ heart disease, cancer , respiratory and sinus infections  Pt voices understanding Pt has cut back to 1/2 ppd but states she is not ready to quit      Other Visit Diagnoses   Need for Tdap vaccination        Relevant Orders       Tdap vaccine greater than or equal to 7yo IM (Completed)    Need for 23-polyvalent pneumococcal polysaccharide vaccine        Relevant Orders       Pneumococcal polysaccharide vaccine 23-valent greater than or equal to 2yo subcutaneous/IM (Completed)

## 2014-07-14 NOTE — Patient Instructions (Signed)
Tdap vaccine today  Pneumonia vaccine today (that is every 5-7 years because you smoke)  Take care of yourself  Wear your sunscreen -do not get burned this summer   Exercise and eat fish to raise your good cholesterol

## 2014-07-14 NOTE — Assessment & Plan Note (Signed)
Reviewed health habits including diet and exercise and skin cancer prevention Reviewed appropriate screening tests for age  Also reviewed health mt list, fam hx and immunization status , as well as social and family history    See HPI Labs reviewed Tdap and pneumovax today

## 2014-07-14 NOTE — Assessment & Plan Note (Signed)
Will continue to observe- bothersome but not painful and is reducible  Still small  Will update me if worse -would consider a surgical opinion (has had surgery once for this)

## 2014-07-14 NOTE — Telephone Encounter (Signed)
Relevant patient education assigned to patient using Emmi. ° °

## 2014-07-14 NOTE — Progress Notes (Signed)
Pre visit review using our clinic review tool, if applicable. No additional management support is needed unless otherwise documented below in the visit note. 

## 2014-08-05 ENCOUNTER — Telehealth: Payer: Self-pay | Admitting: Family Medicine

## 2014-08-05 DIAGNOSIS — K429 Umbilical hernia without obstruction or gangrene: Secondary | ICD-10-CM

## 2014-08-05 NOTE — Telephone Encounter (Signed)
Ref done  

## 2014-08-05 NOTE — Telephone Encounter (Signed)
Pt called back wanting to get referral to a surgeon for her umibicial hernia.  She stated it is getting worse.  She wants to stay in the [place] area. And she is hoping to get medicaid.

## 2014-08-25 ENCOUNTER — Encounter: Payer: Self-pay | Admitting: General Surgery

## 2014-08-25 ENCOUNTER — Ambulatory Visit (INDEPENDENT_AMBULATORY_CARE_PROVIDER_SITE_OTHER): Payer: Self-pay | Admitting: General Surgery

## 2014-08-25 VITALS — BP 116/70 | HR 80 | Resp 12 | Ht 62.0 in | Wt 152.0 lb

## 2014-08-25 DIAGNOSIS — K420 Umbilical hernia with obstruction, without gangrene: Secondary | ICD-10-CM

## 2014-08-25 NOTE — Progress Notes (Signed)
Patient ID: Christine Rice, female   DOB: 1981/12/10, 32 y.o.   MRN: [id number]  Chief Complaint  Patient presents with  . Other    umbilcal hernia    HPI Christine Rice is a 33 y.o. female here today for a evaluation of a umbilical hernia. Patient states she noticed this area about a year ago   . She states the area is getting bigger and starting to both her. Patient had a umbilical hernia surgery back in 2013 done in [place].  HPI  Past Medical History  Diagnosis Date  . Anxiety     Past Surgical History  Procedure Laterality Date  . Cesarean section  01/06/05  . Cesarean section  01/19/2012    Procedure: CESAREAN SECTION;  Surgeon: Luz Lex, MD;  Location: [facility];  Service: Gynecology;  Laterality: N/A;  repeat  . Inguinal hernia repair  01/19/2012    Procedure: HERNIA REPAIR INGUINAL ADULT BILATERAL;  Surgeon: Adin Hector, MD;  Location: [facility];  Service: General;  Laterality: Bilateral;  . Umbilical hernia repair  01/19/2012    Procedure: HERNIA REPAIR UMBILICAL ADULT;  Surgeon: Adin Hector, MD;  Location: [facility];  Service: General;  Laterality: N/A;  . Hernia repair      History reviewed. No pertinent family history.  Social History History  Substance Use Topics  . Smoking status: Current Every Day Smoker -- 0.10 packs/day    Types: Cigarettes  . Smokeless tobacco: Never Used  . Alcohol Use: Yes     Comment: occ/socially    Allergies  Allergen Reactions  . Amoxicillin Hives    REACTION: rash    Current Outpatient Prescriptions  Medication Sig Dispense Refill  . ALPRAZolam (XANAX) 0.25 MG tablet TAKE 1 TABLET BY MOUTH TWICE A DAY AS NEEDED FOR ANXIETY OR SLEEP  30 tablet  0  . mirtazapine (REMERON) 15 MG tablet Take 1 tablet (15 mg total) by mouth at bedtime.  30 tablet  11  . PRENATAL VITAMINS PO Take by mouth daily.         No current facility-administered medications for this visit.    Review of Systems Review of Systems  Constitutional:  Negative.   Respiratory: Negative.   Cardiovascular: Negative.     Blood pressure 116/70, pulse 80, resp. rate 12, height 5\' 2"  (1.575 m), weight 152 lb (68.947 kg).  Physical Exam Physical Exam  Constitutional: She is oriented to person, place, and time. She appears well-developed and well-nourished.  Eyes: Conjunctivae are normal. No scleral icterus.  Neck: Neck supple.  Cardiovascular: Normal rate, regular rhythm and normal heart sounds.   Pulmonary/Chest: Effort normal and breath sounds normal.  Abdominal: Soft. Normal appearance and bowel sounds are normal. There is no hepatomegaly. There is tenderness. A hernia (umbilical  3cm, irreducible, mildly tender) is present.  Neurological: She is alert and oriented to person, place, and time.  Skin: Skin is warm and dry.    Data Reviewed Op note on prior inguinal(mesh) and umbilical hernia(primary) repairs.    Assessment    Recurrent umbilical hernia, irreducible.    Plan  Discussed umbilical hernia repair. Best optional is laparoscopic with mesh- explained   Details, risks and beneefits of procedure and  patient agrees.        Leylanie Woodmansee G 08/26/2014, 5:22 PM

## 2014-08-25 NOTE — Patient Instructions (Signed)
Hernia Repair with Laparoscope A hernia occurs when an internal organ pushes out through a weak spot in the belly (abdominal) wall muscles. Hernias most commonly occur in the groin and around the navel. Hernias can also occur through a cut by the surgeon (incision) after an abdominal operation. A hernia may be caused by:  Lifting heavy objects.  Prolonged coughing.  Straining to move your bowels. Hernias can often be pushed back into place (reduced). Most hernias tend to get worse over time. Problems occur when abdominal contents get stuck in the opening and the blood supply is blocked or impaired (incarcerated hernia). Because of these risks, you require surgery to repair the hernia. Your hernia will be repaired using a laparoscope. Laparoscopic surgery is a type of minimally invasive surgery. It does not involve making a typical surgical cut (incision) in the skin. A laparoscope is a telescope-like rod and lens system. It is usually connected to a video camera and a light source so your caregiver can clearly see the operative area. The instruments are inserted through  to  inch (5 mm or 10 mm) openings in the skin at specific locations. A working and viewing space is created by blowing a small amount of carbon dioxide gas into the abdominal cavity. The abdomen is essentially blown up like a balloon (insufflated). This elevates the abdominal wall above the internal organs like a dome. The carbon dioxide gas is common to the human body and can be absorbed by tissue and removed by the respiratory system. Once the repair is completed, the small incisions will be closed with either stitches (sutures) or staples (just like a paper stapler only this staple holds the skin together). LET YOUR CAREGIVERS KNOW ABOUT:  Allergies.  Medications taken including herbs, eye drops, over the counter medications, and creams.  Use of steroids (by mouth or creams).  Previous problems with anesthetics or  Novocaine.  Possibility of pregnancy, if this applies.  History of blood clots (thrombophlebitis).  History of bleeding or blood problems.  Previous surgery.  Other health problems. BEFORE THE PROCEDURE  Laparoscopy can be done either in a hospital or out-patient clinic. You may be given a mild sedative to help you relax before the procedure. Once in the operating room, you will be given a general anesthesia to make you sleep (unless you and your caregiver choose a different anesthetic).  AFTER THE PROCEDURE  After the procedure you will be watched in a recovery area. Depending on what type of hernia was repaired, you might be admitted to the hospital or you might go home the same day. With this procedure you may have less pain and scarring. This usually results in a quicker recovery and less risk of infection. HOME CARE INSTRUCTIONS   Bed rest is not required. You may continue your normal activities but avoid heavy lifting (more than 10 pounds) or straining.  Cough gently. If you are a smoker it is best to stop, as even the best hernia repair can break down with the continual strain of coughing.  Avoid driving until given the OK by your surgeon.  There are no dietary restrictions unless given otherwise.  TAKE ALL MEDICATIONS AS DIRECTED.  Only take over-the-counter or prescription medicines for pain, discomfort, or fever as directed by your caregiver. SEEK MEDICAL CARE IF:   There is increasing abdominal pain or pain in your incisions.  There is more bleeding from incisions, other than minimal spotting.  You feel light headed or faint.  You   develop an unexplained fever, chills, and/or an oral temperature above 102° F (38.9° C). °· You have redness, swelling, or increasing pain in the wound. °· Pus coming from wound. °· A foul smell coming from the wound or dressings. °SEEK IMMEDIATE MEDICAL CARE IF:  °· You develop a rash. °· You have difficulty breathing. °· You have any  allergic problems. °MAKE SURE YOU:  °· Understand these instructions. °· Will watch your condition. °· Will get help right away if you are not doing well or get worse. °Document Released: 11/28/2005 Document Revised: 02/20/2012 Document Reviewed: 10/28/2009 °ExitCare® Patient Information ©2015 [employer]. This information is not intended to replace advice given to you by your health care provider. Make sure you discuss any questions you have with your health care provider. ° ° °

## 2014-08-26 ENCOUNTER — Encounter: Payer: Self-pay | Admitting: General Surgery

## 2014-09-15 ENCOUNTER — Telehealth: Payer: Self-pay | Admitting: *Deleted

## 2014-09-15 NOTE — Telephone Encounter (Signed)
Patient's surgery has been scheduled for 11-25-14 at [facility].   This patient has been asked to contact the office once she has her insurance information so we can get approval for surgery if needed. Patient states insurance will be effective 10-12-14.

## 2014-10-06 DIAGNOSIS — B20 Human immunodeficiency virus [HIV] disease: Secondary | ICD-10-CM | POA: Insufficient documentation

## 2014-10-13 ENCOUNTER — Encounter: Payer: Self-pay | Admitting: General Surgery

## 2014-10-16 ENCOUNTER — Telehealth: Payer: Self-pay | Admitting: *Deleted

## 2014-10-16 NOTE — Telephone Encounter (Signed)
Patient contacted today to let her know that she will need a pre-op visit prior to surgery scheduled with Dr. Jamal Collin on 11-25-14 at [facility].  This patient states she is driving and will call back later this afternoon to arrange.

## 2014-10-20 ENCOUNTER — Telehealth: Payer: Self-pay | Admitting: *Deleted

## 2014-10-20 NOTE — Telephone Encounter (Signed)
Spoke with patient today. Pre-op appointment scheduled for 11-18-14 with Dr. Jamal Collin.

## 2014-11-04 ENCOUNTER — Telehealth: Payer: Self-pay | Admitting: *Deleted

## 2014-11-04 NOTE — Telephone Encounter (Signed)
Pt was calling to let us know that she got her PCP Changed to Dr.Tower and she will stop by the office to bring in her insurance card.

## 2014-11-11 ENCOUNTER — Telehealth: Payer: Self-pay

## 2014-11-11 NOTE — Telephone Encounter (Signed)
Patient wanted to reschedule her surgery scheduled for 11/25/14 for an earlier date. She is rescheduled for surgery at [facility] on 11/19/14. She will come to be seen here for a pre op appointment with Dr Jamal Collin and will pre admit by phone with the hospital on 11/18/14. Patient is aware of date and instructions.

## 2014-11-18 ENCOUNTER — Ambulatory Visit (INDEPENDENT_AMBULATORY_CARE_PROVIDER_SITE_OTHER): Payer: 59 | Admitting: General Surgery

## 2014-11-18 ENCOUNTER — Telehealth: Payer: Self-pay | Admitting: *Deleted

## 2014-11-18 ENCOUNTER — Encounter: Payer: Self-pay | Admitting: General Surgery

## 2014-11-18 VITALS — BP 120/74 | HR 70 | Resp 12 | Ht 66.0 in | Wt 149.0 lb

## 2014-11-18 DIAGNOSIS — K420 Umbilical hernia with obstruction, without gangrene: Secondary | ICD-10-CM | POA: Diagnosis not present

## 2014-11-18 NOTE — Telephone Encounter (Signed)
Heather from Pre admit testing called regarding pt allergy to amoxicillin- hives. Pt is scheduled for surgery and would like to clarify with Dr. Jamal Collin if he would like to continue with use of kefzol as ordered. Dr. Jamal Collin was advised of this and will use kefzol as ordered. Nira Conn was advised and acknowledged.

## 2014-11-18 NOTE — Patient Instructions (Signed)
Patient scheduled for surgery on 11/19/14.

## 2014-11-18 NOTE — Progress Notes (Signed)
Patient ID: Christine Rice, female   DOB: [birth year], 33 y.o.   MRN: [id number]  Chief Complaint  Patient presents with  . Pre-op Exam    umbilical hernia    HPI Christine Rice is a 33 y.o. female here today for her pre op umbilical hernia repair scheduled for 11/19/14.  HPI  Past Medical History  Diagnosis Date  . Anxiety     Past Surgical History  Procedure Laterality Date  . Cesarean section  01/06/05  . Cesarean section  01/19/2012    Procedure: CESAREAN SECTION;  Surgeon: Luz Lex, MD;  Location: [facility];  Service: Gynecology;  Laterality: N/A;  repeat  . Inguinal hernia repair  01/19/2012    Procedure: HERNIA REPAIR INGUINAL ADULT BILATERAL;  Surgeon: Adin Hector, MD;  Location: [facility];  Service: General;  Laterality: Bilateral;  . Umbilical hernia repair  01/19/2012    Procedure: HERNIA REPAIR UMBILICAL ADULT;  Surgeon: Adin Hector, MD;  Location: [facility];  Service: General;  Laterality: N/A;  . Hernia repair      History reviewed. No pertinent family history.  Social History History  Substance Use Topics  . Smoking status: Current Every Day Smoker -- 0.10 packs/day    Types: Cigarettes  . Smokeless tobacco: Never Used  . Alcohol Use: Yes     Comment: occ/socially    Allergies  Allergen Reactions  . Amoxicillin Hives    REACTION: rash    Current Outpatient Prescriptions  Medication Sig Dispense Refill  . ALPRAZolam (XANAX) 0.25 MG tablet TAKE 1 TABLET BY MOUTH TWICE A DAY AS NEEDED FOR ANXIETY OR SLEEP 30 tablet 0  . mirtazapine (REMERON) 15 MG tablet Take 1 tablet (15 mg total) by mouth at bedtime. 30 tablet 11  . PRENATAL VITAMINS PO Take by mouth daily.       No current facility-administered medications for this visit.    Review of Systems Review of Systems  Constitutional: Negative.   Respiratory: Negative.   Cardiovascular: Negative.     Blood pressure 120/74, pulse 70, resp. rate 12, height 5\' 6"  (1.676 m), weight 149 lb (67.586  kg).  Physical Exam Physical Exam  Constitutional: She is oriented to person, place, and time. She appears well-developed and well-nourished.  Eyes: Conjunctivae are normal. No scleral icterus.  Neck: Neck supple. No tracheal deviation present.  Cardiovascular: Normal rate, regular rhythm and normal heart sounds.   Pulmonary/Chest: Effort normal and breath sounds normal.  Abdominal: Soft. Normal appearance and bowel sounds are normal. A hernia (umbilical hernia) is present.  3cm irreducible umbilical hernia  Neurological: She is alert and oriented to person, place, and time.  Skin: Skin is warm and dry.    Data Reviewed Notes reviewed   Assessment    Recurrent irreducible umbilical hernia    Plan    Patient is scheduled for laparoscopic umbilical hernia  surgery on 11/19/14. Procedure explained to her again.       Armya Westerhoff G 11/18/2014, 9:55 AM

## 2014-11-19 ENCOUNTER — Ambulatory Visit: Payer: Self-pay | Admitting: General Surgery

## 2014-11-19 DIAGNOSIS — K420 Umbilical hernia with obstruction, without gangrene: Secondary | ICD-10-CM | POA: Diagnosis not present

## 2014-11-19 HISTORY — PX: UMBILICAL HERNIA REPAIR: SHX196

## 2014-11-21 ENCOUNTER — Encounter: Payer: Self-pay | Admitting: General Surgery

## 2014-11-24 ENCOUNTER — Ambulatory Visit (HOSPITAL_BASED_OUTPATIENT_CLINIC_OR_DEPARTMENT_OTHER): Payer: Self-pay

## 2014-11-24 ENCOUNTER — Encounter (HOSPITAL_BASED_OUTPATIENT_CLINIC_OR_DEPARTMENT_OTHER): Payer: Self-pay | Admitting: Student in an Organized Health Care Education/Training Program

## 2014-11-26 ENCOUNTER — Encounter: Payer: Self-pay | Admitting: General Surgery

## 2014-11-26 ENCOUNTER — Ambulatory Visit (INDEPENDENT_AMBULATORY_CARE_PROVIDER_SITE_OTHER): Payer: Self-pay | Admitting: General Surgery

## 2014-11-26 VITALS — BP 118/70 | HR 74 | Resp 14 | Ht 66.0 in | Wt 149.0 lb

## 2014-11-26 DIAGNOSIS — K420 Umbilical hernia with obstruction, without gangrene: Secondary | ICD-10-CM

## 2014-11-26 NOTE — Progress Notes (Signed)
Here today for postoperative visit, umbilical hernia on 74-4-51. States she is gradually making progress. Bowels moving regular. Incision site looks clean and healing well.  Moderate tenderness at hernia site. No signs of infection or hematoma. Abdomen is soft, good bowel sounds. Overall doing well. Rx given for Percocet 5 mg 1 po q6h prn for pain, # 20. Will recheck in 1 mo.

## 2014-11-26 NOTE — Patient Instructions (Signed)
Patient to return in one month. 

## 2014-12-16 ENCOUNTER — Ambulatory Visit: Attending: Internal Medicine | Admitting: Student in an Organized Health Care Education/Training Program

## 2014-12-16 ENCOUNTER — Ambulatory Visit (HOSPITAL_BASED_OUTPATIENT_CLINIC_OR_DEPARTMENT_OTHER)

## 2014-12-16 VITALS — BP 126/69 | HR 81 | Temp 98.1°F | Resp 18 | Ht 62.0 in | Wt 151.0 lb

## 2014-12-16 DIAGNOSIS — Z0000 Encounter for general adult medical examination without abnormal findings: Secondary | ICD-10-CM | POA: Insufficient documentation

## 2014-12-16 DIAGNOSIS — Z23 Encounter for immunization: Principal | ICD-10-CM | POA: Insufficient documentation

## 2014-12-16 DIAGNOSIS — Z0189 Encounter for other specified special examinations: Secondary | ICD-10-CM

## 2014-12-16 DIAGNOSIS — B20 Human immunodeficiency virus [HIV] disease: Principal | ICD-10-CM

## 2014-12-16 LAB — URINALYSIS
Bilirubin: NEGATIVE
Blood: NEGATIVE
Glucose: NEGATIVE
Ketones: NEGATIVE
Leuk Esterase: NEGATIVE
Nitrite: NEGATIVE
Protein: NEGATIVE
Specific Gravity: 1.013 (ref 1.002–1.030)
Urobilinogen: NEGATIVE
pH: 7 (ref 5.0–8.0)

## 2014-12-16 LAB — HEPATITIS A IGM ANTIBODY, BLOOD: Hepatitis A Antibody IGM: NONREACTIVE

## 2014-12-16 LAB — COMPREHENSIVE METABOLIC PANEL, BLOOD
ALT (SGPT): 26 U/L (ref 0–33)
AST (SGOT): 25 U/L (ref 0–32)
Albumin: 4.4 g/dL (ref 3.5–5.2)
Alkaline Phos: 40 U/L (ref 35–140)
Anion Gap: 17 mmol/L — ABNORMAL HIGH (ref 7–15)
BUN: 10 mg/dL (ref 6–20)
Bicarbonate: 22 mmol/L (ref 22–29)
Bilirubin, Tot: 0.73 mg/dL (ref <1.20)
Calcium: 9.3 mg/dL (ref 8.5–10.6)
Chloride: 101 mmol/L (ref 98–107)
Creatinine: 0.83 mg/dL (ref 0.51–0.95)
GFR: >60 mL/min
Glucose: 80 mg/dL (ref 70–115)
Potassium: 4.2 mmol/L (ref 3.5–5.1)
Sodium: 140 mmol/L (ref 136–145)
Total Protein: 7.1 g/dL (ref 6.0–8.0)

## 2014-12-16 LAB — HEPATITIS B CORE AB TOTAL: HBcAb Total: NONREACTIVE

## 2014-12-16 LAB — HEPATITIS B SURFACE AB, QUANT, BLOOD: HBsAb,Qt: <3.5 m[IU]/mL

## 2014-12-16 LAB — LIPID(CHOL FRACT) PANEL, BLOOD
Cholesterol: 165 mg/dL (ref <200)
HDL-Cholesterol: 59 mg/dL
LDL-Chol (Calc): 78 mg/dL (ref <160)
Non-HDL Cholesterol: 106 mg/dL
Triglycerides: 140 mg/dL (ref 10–170)

## 2014-12-16 LAB — HEP A TOTAL ANTIBODY: Hepatitis A Antibody: NONREACTIVE

## 2014-12-16 MED ORDER — IBUPROFEN 400 MG OR TABS
400.00 mg | ORAL_TABLET | Freq: Two times a day (BID) | ORAL | Status: DC
Start: 2014-12-16 — End: 2015-04-21

## 2014-12-16 NOTE — Progress Notes (Signed)
Initial Jacqueline Fernandez Visit  (Provider note)    See also Jacqueline Fernandez Initial Eval Doc Flowsheet which I reviewed as part of this initial visit with the patient. (Flowsheet 159).    Subjective:   The patient is newly into HIV care. Jacqueline Fernandez is a 34 year old female here to establish care for HIV/AIDS and other medical problems. Patient was recently diagnosed on routine testing on October 07 2014. Per patient's report, last test was performed in 2011 when she was pregnant with her third child. Believes her risk factors to be "a toss up" between needle sharing and heterosexual intercourse. States she was using speed via injection with needle sharing with her female partner up until 3 months ago, prior to being incarcerated in jail. Also had unprotected sexual intercourse, unaware of HIV status of her partner. Denies any recent flu-like illness or any other new illnesses.     Also complains of dental issues, states that "my teeth in the back are decaying" on the right and left lower jaw, within the past year. Taking penicillin for this (prescribed in jail) as well as motrin for pain.    Has a long-standing history of substance abuse. 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 States she has not used any drugs since. Intends to quit injecting or using drugs after she gets out of jail. Has not used any other drugs besides methamphetamine; also endorsed MJ use to [facility]. Also endorses heavy EtOH for the past 1 year, prior to which she states she did not really drink heavily. States prior to incarceration she would drink about a pint of whiskey a day, now is drinking more, "whatever money can buy". Active smoker of 1 ppd for the past 15 years, has not smoked since she has been in jail.     No history of psychiatric issues per patient's report.    The patient was diagnosed with HIV in October  2015.   AIDS defining illnesses/related illnesses: none  HIV Risk Factor: unprotected sex; IVDA;  HIV therapy history was reviewed: see initial eval doc flowsheet    Most recent CD4/HIV viral load:none available    Results reviewed with Jacqueline Fernandez. She is not on ARVs.     PMHx:  Patient Active Problem List   Diagnosis    Human immunodeficiency virus (HIV) disease       Review of Systems: negative unless bolded below or otherwise noted in HPI above  Constitutional: No fevers, chills or night sweats; weight stable  HEENT: No headache. No sinus pain or drainage. No visual symptoms; oral lesions/pain. No odynphagia or dysphagia  Heme: No easy bruising, epistaxis or bleeding gums  Pulmonary: No cough or dyspnea and chest pain  Cardiac: No chest pain, orthopnea, or PND  GI: No abdominal pain, No nausea, vomiting or diarrhea  GU: No discharge, hematuria and dysuria  GYN: Menses normal; no vaginal disharge  Neurological: No neuropathic pain  Musculoskeletal: No myalgias or arthralgias  Skin: No rash or other skin problems  Psych: Denies depression or anxiety    Active Meds: Motrin 400 bid and Penicillin V oral    Allergies: nkda    Social Hx: has 3 children; see substance abuse history above    SurgHx: tubal ligation in 2011    Family Hx: mother had ovarian cyst. Father may have hypoglycemic.  Physical Exam  BP 126/69 mmHg   Pulse 81   Temp(Src) 98.1 F (36.7 C) (Oral)   Resp 18   Ht $R'5\' 2"'MA$  (1.575 m)   Wt 68.493 kg (151 lb)   BMI 27.61 kg/m2   SpO2 99%  GENERAL APPEARANCE: Normal: NAD, alert and interactive    SKIN: Normal: no skin lesions or rashes    HEENT: Normal: anicteric, non-injected. Right lower jaw posterior tooth with exposed metal. No erythema or other lesion surrounding. No other oral lesions appreciated.    Fundi:          Cotton wool spots: Not Done                       Hemorrhage: Not Done  Oropharynx: Thrush: Absent                       Oral Hairy leukoplakia: Absent                        Ulcerations: Absent            Gums /Dention: Absent    CHEST: Normal: CTA B without wh/r; moderate aeration B    CARDIAC Normal : RR nl S1S2 and no m/r/g    ABDOMEN Normal: soft, NT, ND, NABS  Hepatomegaly: Absent  Spenomegaly: Absent    Rectal Exam: Not DoneHemoccult stool: Not Done  Anoscopy: Not Done    PELVIC EXAM (female)  Bimanual: Not Done  Speculum exam: Not Done  PAP smear: Not obtained    EXTREMITIES: Normal: no edema    LYMPH NODES: LEFT                 Anterior Cervical: Normal: less than 1 cm                 Posterior Cervical:Normal: less than 1 cm                 Supraclavicular:Normal: nonpalpable                 Axillary:Normal: less than 1 cm                 Epitrochlear: Normal: less than 1 cm                 Inguinal:Normal: less than 2 cm                 Femoral:Not Done    LYMPH NODES: RIGHT                 Anterior Cervical: Normal: less than 1 cm                 Posterior Cervical:Normal: less than 1 cm                 Supraclavicular:Normal: nonpalpable                 Axillary:Normal: less than 1 cm                 Epitrochlear: Normal: less than 1 cm                 Inguinal:Normal: less than 2 cm                 Femoral:Not Done     MENTAL STATUS  Appearance: Appropriate                 Speech: Appropriate                 Affect: Appropriate                 Thought Processes:Appropriate                 Cognitive Function:Appropriate                 Recent Memory:Appropriate    TUBERCULOSIS RISK                 Was the patient anergic?  Unknown    NERVOUS SYSTEM                Cranial Nerve:Normal                Sensation:Normal                Motor:Normal                Cerebellar:Not Done                Babinski Left: Not Done                Babinski Right:Not Done    REFLEXES (0-4)                Left Biceps:Not Done                Right Biceps:Not Done                Left Triceps:Not Done                Right Triceps:Not Done                Left Knee:2+                 Right Knee:2+                Left Ankle:Not Done                Right Ankle:Not Done    Labs    HIV Ab positive on 10/07/14  HCV Ab negative  HbcAb negative    Outside medical recordswere reviewed.    A/P  Jacqueline Fernandez is a 34 year old female here to establish care for HIV/AIDS and other medical problems.     1. HIV/AIDS: CDC HIV Stage: unknown.   Baseline labs were ordered today including CD4 and HIV Viral Load.    --CBC with diff   --CMP   --Lipids   --A1C   --UA   --Hep A Total Ab   --Hep B sAB, sAg, cAb (total core Ab)   --HCV Ab   --Syphilis EIA (no known history per pt's report)  --urine GC/CT   --HLA B5701   --Quantiferon   --G6PD   --HIV genotype   --Vitamin D level    Patient counseled today regarding diagnosis and goal to control HIV disease with ARV therapy. All questions answered. Will plan to obtain above labs including viral load, CD4 and HIV genotype, and discuss initiation of treatment at next visit.     2. Dental pain - with exposed metallic component to right posterior lower tooth and jaw pain. Suggestive of peridontal infection such as peridontitis or possible underlying fluid collection/abscess, though not visualized on exam.  - recommend continuing  penicillin for 1 week  - recommend patient see dentist in jail    3. Substance abuse (methamphetamine, EtOH, cigarette) - counseled patient extensively today regarding importance of cessation of substance use, particularly methamphetamine and heavy EtOH use, as well as cigarette smoking.   - patient understanding and is motivated to quit substance use, states she does not want to share needles or inject drugs  - will readdress further at patient with next visit, as well as consider referral to [facility] for additional counseling and resources should pt be interested in this, however this was not discussed at length today    4. Health Maintenance:  Flu shot: administered today  Tdap: unknown, will administer at next visit  Prevnar:  administered today;   Quantiferon: ordered today  Mammogram: not indicated  Colonoscopy: not indicated  Pap smear - cervical: deferred   Pap smear - anal: deferred  Hepatitis A: IgM/Total Panel ordered today  Hepatitis B: HBcAb negative, will reorder today along with HBsAg and HBsAb (drawn months ago)  Hepatitis C: negative, will reorder today (drawn months ago)  Dental cleaning: as above, recommend pt see outside dentist in jail    [place]  Return to clinic in 2 week(s) for follow-up.     SCREENING AND COUNSELING WAS PROVIDED THAT ADDRESSED:  Other Aspects of HIV Drug Therapy  Tobacco Use  Alcohol Abuse  Substance Abuse  Safe Sex including STD Screening  Regular Dental Exams  HIV Transmission Risk Behaviors  Tobacco Use  Alcohol Abuse  Substance Abuse  Depression / Suicide Risk Assessment     Patient was seen and discussed with Dr. Darlys Gales.    Dianne Dun MD PGY-2

## 2014-12-16 NOTE — Interdisciplinary (Signed)
Nurse Immunization Documentation Note    Diagnosis:     ICD-10-CM ICD-9-CM    1. Influenza vaccine needed Z23 V04.81 INFLUENZA VAC 4 VALENT PRSRV FREE 3 YRS PLUS IM   2. Human immunodeficiency virus (HIV) disease B20 042 CBC WITH ADIFF, BLOOD      COMPREHENSIVE METABOLIC PANEL, BLOOD      LIPID(CHOL FRACT) PANEL, BLOOD      GLYCOSYLATED HGB(A1C), BLOOD      URINALYSIS      T CELL SUBSETS, BLOOD      HIV-1 RNA U.QUANT/PLASMA      HEPATITIS C AB, BLOOD      HEPATITIS A IGM ANTIBODY, BLOOD      HEP A TOTAL ANTIBODY      HEPATITIS B SURFACE AB, QUANT, BLOOD      HEPATITIS B CORE AB TOTAL      HEPATITIS B SURFACE AG, BLOOD      SYPHILIS EIA SCREEN, BLOOD      CHLAMYDIA/GC PCR-URINE      ABACAVIR SENSITIVITY (HLA-B5701)      QUANTIFERON-TB, BLOOD      G6PD, BLOOD      HIV 1, GENOTYPE, BLOOD      VITAMIN D, 25-HYDROXY, BLOOD      URINALYSIS      CHLAMYDIA/GC PCR-URINE      LIPID(CHOL FRACT) PANEL, BLOOD      GLYCOSYLATED HGB(A1C), BLOOD      T CELL SUBSETS, BLOOD      HIV-1 RNA U.QUANT/PLASMA      HEPATITIS C AB, BLOOD      HEPATITIS A IGM ANTIBODY, BLOOD      HEP A TOTAL ANTIBODY      HEPATITIS B SURFACE AB, QUANT, BLOOD      HEPATITIS B CORE AB TOTAL      HEPATITIS B SURFACE AG, BLOOD      SYPHILIS EIA SCREEN, BLOOD      ABACAVIR SENSITIVITY (HLA-B5701)      QUANTIFERON-TB, BLOOD      G6PD, BLOOD      HIV 1, GENOTYPE, BLOOD      VITAMIN D, 25-HYDROXY, BLOOD      CBC WITH ADIFF, BLOOD      COMPREHENSIVE METABOLIC PANEL, BLOOD   3. Need for pneumococcal vaccination Z23 V03.82 PNEUMOCOCCAL CONJ VACCINE 13 VALENT IM      PNEUMOVAX; ADULT   4. Routine lab draw Z00.00 V72.62        Jacqueline Fernandez is a 34 year old year old female, DOB 05/10/1981, allergic to Review of patient's allergies indicates no known allergies..  The patient was identified by name and date of birth. Allergies were verified.  The patient was educated about possible medication side effects and the procedure was explained to the patient.     Patient  Education    Jacqueline Fernandez is a 34 year old female whose chief complaint is HIV and New Patient    Filed Vitals:    12/16/14 1318   BP: 126/69   Pulse: 81   Temp: 98.1 F (36.7 C)   TempSrc: Oral   Resp: 18   Height: $Remove'5\' 2"'uvxnRhn$  (1.575 m)   Weight: 68.493 kg (151 lb)   SpO2: 99%     Pain Score 0  Body surface area is 1.73 meters squared.  Body mass index is 27.61 kg/(m^2).    Patient Education  Identified learning needs: Diagnosis, Care Plan, Treatment  Learner: Patient  Barriers to learning: No Barriers  Readiness  to learn: Acceptance  Method: Explanation and Handout  Treatment education given: No  Fall prevention education given: No, pt steady gait  Pain education given: Yes  Response: Verbalizes understanding

## 2014-12-16 NOTE — Progress Notes (Signed)
Attending Note:    Subjective:  I reviewed the history.  Patient interviewed and examined.  History of present illness (HPI):  Routine Follow Up     Review of Systems (ROS): As per the resident's note.  Past Medical, Family, Social History:  As per the  resident's  note.    Objective:   I have examined the patient and I concur with the resident's exam.  Briefly,   [facility]; [facility]. Newly diagnosed; was negative in 2011.   Problems addressed today:  1. HIV: getting initial intake labs; f/u in 2 weeks for initiation of cART  2. Substance abuse: in custody; monitoring  3. Dental issues: will prolong PCN Tx for periodontitis, f/u with dental in correctional unit  4. HCM: pending results  Assessment and plan reviewed with the resident. I agree with the resident's plan as documented.  See the resident's note for further details.

## 2014-12-17 LAB — HEPATITIS C AB, BLOOD: Hepatitis C Ab: NONREACTIVE

## 2014-12-17 LAB — SYPHILIS EIA SCREEN, BLOOD: Syphilis EIA Screen: NEGATIVE

## 2014-12-17 LAB — HIV-1 RNA QUANT/PLASMA: HIV-1 RNA Ultra Quant, Plasma: 1756 copy/mL — AB

## 2014-12-17 LAB — HEPATITIS B SURFACE AG, BLOOD: HBsAg: NONREACTIVE

## 2014-12-18 LAB — VITAMIN D, 25-OH TOTAL
Vitamin D, 25-OH D2: <5 ng/mL
Vitamin D, 25-OH D3: 28 ng/mL
Vitamin D, 25-OH TOTAL: 28 ng/mL — ABNORMAL LOW (ref 30–80)

## 2014-12-19 LAB — HIV 1, GENOTYPE, BLOOD

## 2014-12-19 LAB — CHLAMYDIA/GONORRHEA PCR, URINE
Chlamydia trachomatis PCR, Urine: NEGATIVE
Neisseria gonorrhoeae PCR, Urine: NEGATIVE

## 2014-12-23 ENCOUNTER — Telehealth (HOSPITAL_BASED_OUTPATIENT_CLINIC_OR_DEPARTMENT_OTHER): Payer: Self-pay | Admitting: Student in an Organized Health Care Education/Training Program

## 2014-12-23 NOTE — Interdisciplinary (Signed)
Pt is a 34 yr old female patient.   Dx with HIV: 2015  RF: Pt states unknown   Non-ARV's: Pencillin  ART: N/A  Current problems: [facility]  Transferring care from: N/A  CD4: Unknown   Date: unknown  VL: Unknown  Date: Unknown  Medical release signed and faxed: No    Electronic Initial eval completed: Yes   Database consent signed: Yes  Specimen Repository consent signed:No   Living arrangements: Pt in sheriff's custody   Employment: N/A   Education:High School Grad   Exercise: N/A   Safer sex: Unknown  Substance use/abuse: Marijuana, meth. No currently   Tobacco use: N/A   Self-reported Psych Hx: None  Benefits:     Reviewed with patient basic HIV 101, ART guidelines, expectations, se's managements, importance of strict adherence to reduce risks of developing resistance, improve quality of life, and reduce transmission. Also reviewed clinic services, consents, a team based healthcare, medication refills, appointment process, and the use of my chart tools for better management/acces to his information and care. He was provided with clinic pamphlets, forms and contact information

## 2014-12-23 NOTE — Telephone Encounter (Signed)
Pt is scheduled for 12/30/14 @ 3:30 and Einar Pheasantoreen states pts can only be seen between 8:00 & 2:00. Please as a friendly reminder contact Einar Pheasantoreen only regarding custody patients. Pt should not be aware of appt.

## 2014-12-24 ENCOUNTER — Ambulatory Visit (INDEPENDENT_AMBULATORY_CARE_PROVIDER_SITE_OTHER): Payer: Self-pay | Admitting: General Surgery

## 2014-12-24 ENCOUNTER — Encounter: Payer: Self-pay | Admitting: General Surgery

## 2014-12-24 VITALS — BP 118/64 | HR 88 | Resp 14 | Ht 66.0 in | Wt 154.0 lb

## 2014-12-24 DIAGNOSIS — K420 Umbilical hernia with obstruction, without gangrene: Secondary | ICD-10-CM

## 2014-12-24 NOTE — Patient Instructions (Signed)
Patient to return as needed. 

## 2014-12-24 NOTE — Progress Notes (Signed)
   Here today for postoperative visit, laparoscopic umbilical hernia repair on 11-19-14. States she has made great progress from last visit.  Bowels moving regular. She feels like the right side of abdomen is still larger- no findings noted there at surgery.    Port sites and umbilical incision well healed and repair intact.  Abdomen is soft, non tender.   Follow up as needed. No restrictions.

## 2014-12-30 ENCOUNTER — Ambulatory Visit: Attending: Internal Medicine | Admitting: Student in an Organized Health Care Education/Training Program

## 2014-12-30 ENCOUNTER — Ambulatory Visit (HOSPITAL_BASED_OUTPATIENT_CLINIC_OR_DEPARTMENT_OTHER): Admitting: Student in an Organized Health Care Education/Training Program

## 2014-12-30 VITALS — BP 122/72 | HR 83 | Temp 97.8°F | Resp 18 | Ht 62.0 in | Wt 152.0 lb

## 2014-12-30 DIAGNOSIS — Z23 Encounter for immunization: Secondary | ICD-10-CM | POA: Insufficient documentation

## 2014-12-30 DIAGNOSIS — F1910 Other psychoactive substance abuse, uncomplicated: Secondary | ICD-10-CM | POA: Insufficient documentation

## 2014-12-30 DIAGNOSIS — B20 Human immunodeficiency virus [HIV] disease: Principal | ICD-10-CM | POA: Insufficient documentation

## 2014-12-30 NOTE — Interdisciplinary (Signed)
Nurse Immunization Documentation Note    Diagnosis:     ICD-10-CM ICD-9-CM    1. Human immunodeficiency virus (HIV) disease B20 042 T CELL SUBSETS, BLOOD      CBC WITH ADIFF, BLOOD      HIV-1 RNA U.QUANT/PLASMA   2. Need for prophylactic vaccination against hepatitis A and hepatitis B Z23 V05.3 HEP A/HEP B VACCINE, ADULT   3. Need for prophylactic vaccination against hepatitis B virus Z23 V05.3    4. Substance abuse F19.10 305.90 OWEN SUBSTANCE ABUSE SERVICE REQUEST   5. Need for prophylactic vaccination against diphtheria-tetanus-pertussis (DTP) Z23 V06.1 TDAP VACCINE >7 YRS, IM       Jacqueline Fernandez is a 34 year old year old female, DOB 27-Mar-1981, allergic to Review of patient's allergies indicates no known allergies..  The patient was identified by name and date of birth. Allergies were verified.  The patient was educated about possible medication side effects and the procedure was explained to the patient.     Patient Education    Jacqueline LundJessica Rae Fernandez is a 34 year old female whose chief complaint is HIV    Filed Vitals:    12/30/14 1231   BP: 122/72   Pulse: 83   Temp: 97.8 F (36.6 C)   TempSrc: Oral   Resp: 18   Height: 5\' 2"  (1.575 m)   Weight: 68.947 kg (152 lb)   SpO2: 98%     Pain Score 0  Body surface area is 1.74 meters squared.  Body mass index is 27.79 kg/(m^2).    Patient Education  Identified learning needs: Diagnosis, Care Plan, Treatment  Learner: Patient  Barriers to learning: No Barriers  Readiness to learn: Acceptance  Method: Explanation and Handout  Treatment education given: Yes  Fall prevention education given: No pt steady gait  Pain education given: Yes  Response: Verbalizes understanding

## 2014-12-30 NOTE — Progress Notes (Signed)
Jacqueline Fernandez is a 34 year old female who is here for routine f/u of HIV/AIDS and other medical problems. Has been doing well since last visit, tooth pain resolved after one was pulled, no longer taking amoxicillin. One more tooth with exposed metal still to be pulled potentially by dentist in upcoming visit. Otherwise pt endorses plan to transition to safe haven house ([facility]) after she is expects to be released from jail in approx 90 days. Three children are living in [place] with her mother, pt does not plan to return there immediately after release but in the medium-term. Feels she has not had time to process HIV diagnosis due to being in jail but is anxious to start medication.     Most recent CD4/HIV viral load:  HIV Serology 12/16/2014   CD4 Count --   CD4 % --   CD8 Count --   CD8 % --   CD4:CD8 Ratio --   bDNA --   RNA-PCR Ultra 1756(A)   RNA-PCR Standard --   HIV Ab  --   HIV RNA --   RNA - PCR Abbott RT --   TaqMan HIV-1 RT-PCR --      Results reviewed with Melvyn Neth Faniel. She is not on ARVs.     No new complaints.    Review of Systems:  Constitutional: No fevers, chills or night sweats; weight stable  HEENT: No headache, No sinus pain or drainage. No visual symptoms; No oral lesions/pain. No odynophagia or dysphagia  Heme: No easy bruising, epistaxis or bleeding gums  Pulmonary: No cough or dyspnea   Cardiac: No chest pain, orthopnea, or PND  GI: No abdominal pain, No nausea, vomiting or diarrhea  GU: No discharge, hematuria and dysuria  GYN: Menses normal; no vaginal disharge  Neurological: No neuropathic pain  Musculoskeletal: No myalgias or arthralgias  SKIN: No rash or other skin problems  Psych: Mood fine      Patient Active Problem List   Diagnosis    Human immunodeficiency virus (HIV) disease     Current Outpatient Prescriptions   Medication Sig    ibuprofen (MOTRIN) 400 MG tablet Take 1 tablet (400 mg) by mouth 2 times daily.     No current facility-administered medications for  this visit.     Physical Exam:  BP 122/72 mmHg   Pulse 83   Temp(Src) 97.8 F (36.6 C) (Oral)   Resp 18   Ht $R'5\' 2"'LI$  (1.575 m)   Wt 68.947 kg (152 lb)   BMI 27.79 kg/m2   SpO2 98%  Body mass index is 27.79 kg/(m^2).  GEN: appears well; well developed; well nourished; No distress  HEENT: OP clear; no thrush or OHL; no cervical lymphadenopathy. Posterior R molar with exposed metal, no surrounding erythema.  LUNGS: CTA B/L  HEART: RR no M  ABD: soft, NT, ND, NABS; no HSM  EXT: no edema    LABS/STUDIES:  Lab Results   Component Value Date    BUN 10 12/16/2014    CREAT 0.83 12/16/2014    CL 101 12/16/2014    NA 140 12/16/2014    K 4.2 12/16/2014    [place] 9.3 12/16/2014    BICARB 22 12/16/2014    GLU 80 12/16/2014     Lab Results   Component Value Date    AST 25 12/16/2014    ALT 26 12/16/2014    ALK 40 12/16/2014    TP 7.1 12/16/2014    ALB 4.4 12/16/2014  TBILI 0.73 12/16/2014     Lab Results   Component Value Date    CHOL 165 12/16/2014    HDL 59 12/16/2014    LDLCALC 78 12/16/2014    TRIG 140 12/16/2014     No results found for: WBC, RBC, HGB, HCT, MCV, MCHC, RDW, PLT, MPV, SEGS, LYMPHS, MONOS, EOS, BASOS  Lab Results   Component Value Date    COLORUA Straw 12/16/2014    APPEARUA Clear 12/16/2014    GLUCOSEUA Negative 12/16/2014    BILIUA Negative 12/16/2014    KETONEUA Negative 12/16/2014    SGUA 1.013 12/16/2014    BLOODUA Negative 12/16/2014    PHUA 7.0 12/16/2014    PROTEINUA Negative 12/16/2014    UROBILUA Negative 12/16/2014    NITRITEUA Negative 12/16/2014    LEUKESTUA Negative 12/16/2014    WBCUA 0-2 12/16/2014    RBCUA 0-2 12/16/2014     No results found for: A1C  No results found for: RPR     HIV viral load 1756  CD4 unavailable  HLA B5701 unavailable    HAV Ab nonreactive  HBcAb nonreactive  HBsAb <3.5  HCV Ab nonreactive  Syphilis EIA negative    HIV genotype without resistance to major classes of ARV's    All labs reviewed.    ASSESSMENT AND PLAN    Jacqueline Fernandez is a 34 year old female who is here for  routine f/u of HIV/AIDS and other medical problems.    #HIV - CD4 unknown, VL 1756. Pt feels well at this time without signs or symptoms of illness. Infected sometime between 2011 (birth of 3rd child when last HIV test was performed) and October 2015 when diagnosed. HLA B5701 unknown. Tubal ligation in 2011 thus low risk of pregnancy in future.  - will initiate Stribild today (Pregnancy category B); plan to consider switch to [facility] in future once pt is out of correctional system and this medication may be obtainable; counseled patient on importance of adherence, risks/benefits, side effects.   - CD4, CBC, HLAB570, Quantiferon today to complete remainder of screening labs (IV access an issue at last visit)  - will initiate OI ppx if patient's CD4 <200    #Substance abuse (methamphetamine, EtOH, cigarette)   - again counseled pt toady on importance of cessation of substance abuse  - Roxy Manns Substance Abuse consult placed today    # Dental pain - resolved after tooth pulled by dentist in jail    #Health Maintenance:  Flu shot: 12/16/14  Tdap: administered today  Prevnar: 12/16/14  Quantiferon: pending  Mammogram: not indicated  Colonoscopy: not indicated  Pap smear - cervical: deferred   Pap smear - anal: deferred  Hepatitis A: TwinRx dose one administered today  Hepatitis B: TwinRx dose one administered today  Hepatitis C: negative  Dental cleaning: as above    PATIENT EDUCATION:  Greater than 50% of visit spent counseling ~ 69mintues.  SCREENING AND COUNSELING WAS PROVIDED THAT ADDRESSED:  Adherence  Drug Resistance  Drug Interactions  Other Aspects of HIV Drug Therapy  Tobacco Use  Alcohol Abuse  Substance Abuse  Safe Sex including STD Screening  HIV Transmission Risk Behaviors  Tobacco Use  Alcohol Abuse  Substance Abuse    Patient was seen and discussed with Dr. Darlys Gales today. RTC in 4-6 weeks for follow-up and repeat viral load prior to visit.    Dianne Dun MD PGY-2

## 2014-12-31 LAB — T CELL SUBSETS, BLOOD
CD4+ T-Cell %: 29 % (ref 29–61)
CD4+ T-Cell Abs: 251 cells/uL (ref 250–1200)
CD4:CD8 Ratio: 0.56 — ABNORMAL LOW (ref 0.70–4.00)
CD8+ T-Cell %: 51 % — ABNORMAL HIGH (ref 11–38)
CD8+ T-Cell Abs: 446 cells/uL (ref 100–800)

## 2014-12-31 NOTE — Progress Notes (Signed)
Attending Note:    Subjective:  I reviewed the history.  Patient interviewed and examined.  History of present illness (HPI):  Routine Follow Up     Review of Systems (ROS): As per the resident's note.  Past Medical, Family, Social History:  As per the  resident's  note.    Objective:   I have examined the patient and I concur with the resident's exam.  Briefly,   Patient is here for initiate cART; we will start with INSTI based regimen with Stribild, for now (we are not sure Genvoya will be available in the correctional system; was just approved - but she can transition to [facility] soon). She is s/p tubal ligation. Updating labs (needs HLA-B5701 also) and will f/u in 4-6 weeks to assess response and tolerability.  Assessment and plan reviewed with the resident. I agree with the resident's plan as documented.  See the resident's note for further details.

## 2015-01-01 LAB — QUANTIFERON-TB, BLOOD
Quantiferon TB: NEGATIVE
TB Antigen - Nil: 0.001 IU/mL

## 2015-01-02 LAB — ABACAVIR SENSITIVITY (HLA-B5701)

## 2015-01-09 ENCOUNTER — Telehealth (HOSPITAL_BASED_OUTPATIENT_CLINIC_OR_DEPARTMENT_OTHER): Payer: Self-pay | Admitting: Student in an Organized Health Care Education/Training Program

## 2015-01-09 NOTE — Telephone Encounter (Signed)
Requesting pt rescheduled because pt should have not known about appt on 01/28/15. Is asking to reschedule and Dr.Koffman only has appts after 3:00. Jacqueline Fernandez has specific times and is asking for another provider to see pt.

## 2015-01-09 NOTE — Telephone Encounter (Signed)
LVN called and talked to [place] regarding previous message. We need to reschedule appointment for pt. Jacqueline Fernandez asked to coordinate appointments through her. Jacqueline Fernandez stated will have to change pt's appointment. Informed Jacqueline Fernandez will page provider to change appointment time and will call when I hear back from provider.

## 2015-01-13 ENCOUNTER — Ambulatory Visit (HOSPITAL_BASED_OUTPATIENT_CLINIC_OR_DEPARTMENT_OTHER): Admitting: Student in an Organized Health Care Education/Training Program

## 2015-01-14 NOTE — Telephone Encounter (Signed)
LVN called and talked to Einar Pheasantoreen to reschedule appointment for pt. Scheduled appointment for 02/18/2015 at 1:00pm. Ok per Dr. Barrington EllisonKofman.

## 2015-01-14 NOTE — Telephone Encounter (Addendum)
Jacqueline Fernandez calling to reschedule appointment on  02/17 @ 1:00 due to transportation, [place]en requesting to schedule patient no later than 2:00 p.m. Please assist in scheduling. Can be reached at [phone].

## 2015-01-28 ENCOUNTER — Ambulatory Visit (HOSPITAL_BASED_OUTPATIENT_CLINIC_OR_DEPARTMENT_OTHER): Admitting: Student in an Organized Health Care Education/Training Program

## 2015-02-10 ENCOUNTER — Encounter (HOSPITAL_BASED_OUTPATIENT_CLINIC_OR_DEPARTMENT_OTHER): Payer: Self-pay | Admitting: Infectious Disease

## 2015-02-10 ENCOUNTER — Ambulatory Visit (HOSPITAL_BASED_OUTPATIENT_CLINIC_OR_DEPARTMENT_OTHER)

## 2015-02-10 ENCOUNTER — Ambulatory Visit: Attending: Infectious Disease | Admitting: Infectious Disease

## 2015-02-10 VITALS — BP 124/70 | HR 81 | Temp 99.3°F | Resp 18 | Ht 62.0 in | Wt 142.4 lb

## 2015-02-10 DIAGNOSIS — Z8659 Personal history of other mental and behavioral disorders: Secondary | ICD-10-CM

## 2015-02-10 DIAGNOSIS — B20 Human immunodeficiency virus [HIV] disease: Secondary | ICD-10-CM | POA: Insufficient documentation

## 2015-02-10 DIAGNOSIS — D849 Immunodeficiency, unspecified: Secondary | ICD-10-CM

## 2015-02-10 DIAGNOSIS — K088 Other specified disorders of teeth and supporting structures: Secondary | ICD-10-CM

## 2015-02-10 DIAGNOSIS — Z87891 Personal history of nicotine dependence: Secondary | ICD-10-CM

## 2015-02-10 DIAGNOSIS — R87612 Low grade squamous intraepithelial lesion on cytologic smear of cervix (LGSIL): Secondary | ICD-10-CM | POA: Insufficient documentation

## 2015-02-10 LAB — VAGINOSIS SCREEN
CANDIDA, VAGINAL SCREEN: NEGATIVE
GARDNERELLA, VAGINAL SCREEN: NEGATIVE
TRICHOMONAS, VAGINAL SCREEN: NEGATIVE

## 2015-02-10 MED ORDER — EMTRICITAB-RILPIVIR-TENOFOV DF 200-25-300 MG PO TABS
1.0000 | ORAL_TABLET | Freq: Every day | ORAL | Status: DC
Start: 2015-02-10 — End: 2015-04-21

## 2015-02-10 NOTE — Progress Notes (Signed)
Attending Note:    Subjective:  I reviewed the history.  Patient interviewed and examined.  History of present illness (HPI):  Routine Follow Up - recently started ART with STRIBILD     Review of Systems (ROS): As per the fellow's note.  Past Medical, Family, Social History:  As per the fellow's note.    Objective:   I have examined the patient and I concur with the fellow's exam.  Assessment and plan reviewed with the fellow. I agree with the fellow's plan as documented.  See the fellow's note for further details.  Once ADAP established will switch to Complera given the patient's complaints of nausea.  Follow-up to be scheduled with Dr. Barrington EllisonKofman who is her PCP.    Schuyler AmorHARLES Aamna Mallozzi, M.D.

## 2015-02-10 NOTE — Patient Instructions (Signed)
Return with;  1. Support affidavit and an Teacher, early years/preutility bill for residency proof  2. Medi-Cal application proof. [facility](County form or [place]WS[place])     [facility]

## 2015-02-10 NOTE — Interdisciplinary (Signed)
Pt here for ADAP enrollment to get meds covered. She's unemployed, she's on a residential treatment facility for the next 3 months, has no insurance, and boyfriend provides some assistance. A 30 day ADAP coverage was submitted for approval, she was given copies of her enrollment along with a support affidavit for residency proof, and a county form for Medi-Cal to complete since she already applied for.

## 2015-02-10 NOTE — Progress Notes (Signed)
Jacqueline Fernandez is a 34 year old female with a   Lab Results   Component Value Date    THELPERABS 251 12/30/2014    and   Lab Results   Component Value Date    HIV1RNAULTRA 1756 12/16/2014   ,  who comes in for routine HIV follow up.    She was diagnosed on routine testing 10/07/14 who established care at [facility] in Jan 2016 with Dr. Terie Purser. Last seen 12/30/14 while she was incarcerated. She was started on Stribild with plans to switch to [facility] once the patient was out of the correctional facility and the med was accessible.     Released on 01/21/15 from [facility]. Staying at [facility] drug treatment facility for 3 months. After moving into the facility with cold symptoms, fevers, muscle aches, headaches and diarrhea. She had cramping with her last period and lots of clots. LMP 01/27/15, lasted 4 days. Normally has regular periods once a month that last 5-7 days. She had a tubal ligation in 2009. Last pregnancy prior to that. No PAP smears since her last pregnancy.     No missed doses of Stribild. She does note nausea since starting the medication with decreased appetite and weight loss. Not taken with food. She is interested in switching to a different pill.     Review of Systems:  Constitutional: denies fever, chills, night sweats, significant unintentional weight loss  Skin: denies rash, lesions, or ulcer  HEENT: denies teary/ runny eyes, runny nose, HA, sinus pain, vision changes, sore throat, nasal congestion, difficulty to swallow, or pain with swallowing  Pulmonary: denies cough, wheezing, SOB  Cardiac: denies CP, palpitation  Gastro-Intestinal/Biliary/Hepatic: denies abdominal pain, nausea/ vomiting, diarrhea, constipation  GU: + pelvic cramping, no discharge; denies dysuria  Neurological: denies numbness/ tingling, pain with ambulation  Musculoskeletal: denies leg or joint swelling, pain    Ongoing Problems:  Patient Active Problem List   Diagnosis    Human immunodeficiency virus (HIV)  disease       ARV Adherence: 100%.    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 Released from jail 01/2015 and now in drug rehabilitation program. She has 3 children who live with her mother in [place].    No Known Allergies    Physical Exam:  BP 124/70 mmHg   Pulse 81   Temp(Src) 99.3 F (37.4 C) (Oral)   Resp 18   Ht 5' 2" (1.575 m)   Wt 64.592 kg (142 lb 6.4 oz)   BMI 26.04 kg/m2  Body mass index is 26.04 kg/(m^2).  GEN: well developed, well nourished, NAD  HEENT: Normocephalic, atraumatic, PERRL, EOMI, oropharynx clear without thrush or oral hairy leukoplakia  LUNGS: clear to auscultation , without wheezes or rales  HEART: Regular rate and rhythm, no murmurs, rubs or gallops  ABD:  Soft, NT, ND, normactive BS  GENT: labia are pink with no lesions or erythema; cervical OS without minimal clear discharge and no masses on bimaual exam  BJE: no edema/clubbing/cyanosis  NEURO: A&Ox3, appropriate mood and appearance, normal gait, CN2-12 grossly intact, no focal neurologic deficit  SKIN: dry and warm to touch    Studies  Lab Results   Component Value Date    THELPERABS 251 12/30/2014     No components found for: [facility]  Lab Results   Component Value Date    HIV1RNAULTRA 1756 12/16/2014  No results found for: HLAB5701  No results found for: GFRAA  No results found for: WBC, RBC, HGB, HCT, MCV, MCHC, RDW, PLT, MPV, SEGS, LYMPHS, MONOS, EOS, BASOS  No results found for: INR  Lab Results   Component Value Date    HIV1RNAULTRA 1756 12/16/2014     Lab Results   Component Value Date    BUN 10 12/16/2014    CREAT 0.83 12/16/2014    CL 101 12/16/2014    NA 140 12/16/2014    K 4.2 12/16/2014    [place] 9.3 12/16/2014    TBILI 0.73 12/16/2014    ALB 4.4 12/16/2014    TP 7.1 12/16/2014    AST 25 12/16/2014    ALK 40 12/16/2014    BICARB 22 12/16/2014    ALT 26 12/16/2014    GLU 80 12/16/2014     Lab Results   Component Value Date    AST 25 12/16/2014    ALT 26  12/16/2014    ALK 40 12/16/2014    TP 7.1 12/16/2014    ALB 4.4 12/16/2014    TBILI 0.73 12/16/2014     Lab Results   Component Value Date    CHOL 165 12/16/2014    HDL 59 12/16/2014    LDLCALC 78 12/16/2014    TRIG 140 12/16/2014     Lab Results   Component Value Date    COLORUA Straw 12/16/2014    APPEARUA Clear 12/16/2014    GLUCOSEUA Negative 12/16/2014    BILIUA Negative 12/16/2014    KETONEUA Negative 12/16/2014    SGUA 1.013 12/16/2014    BLOODUA Negative 12/16/2014    PHUA 7.0 12/16/2014    PROTEINUA Negative 12/16/2014    UROBILUA Negative 12/16/2014    NITRITEUA Negative 12/16/2014    LEUKESTUA Negative 12/16/2014    WBCUA 0-2 12/16/2014    RBCUA 0-2 12/16/2014         ASSESSMENT AND PLAN  Jacqueline Fernandez is a 34 year old year old female here for f/u HIV.      1. HIV: Recent diagnosis 09/2015, started on Stribild 12/30/14. CD4 251/29% and VL 1756 prior to starting meds. Patient complaining of nausea with Stribild and would like to try a different pill. We will switch to Complera as soon as her ADAP application is completed. Will overbook with Clifton James today to get the process started. Long-term plan will be to switch to [facility] (R/F/TAF) once it becomes available.  - Reviewed labs with patient   - CD4, viral load, CMP, CBC today  - Continue Stribild until ADAP application submitted; will then change to Complera    2. History of polysubstance abuse: Patient currently sober in a drug treatment facility following her incarceration. She is motivated to stay sober. Congratulated and encouraged continued sobriety.     3. Patient education - greater than 50% of visit spent counseling    4. Dental pain - tooth pulled by dentist in jail; some pain still but no evidence of abscess or infection on exam. Will need to establish with a dentist.     5. Health maintenance:  Flu shot: 12/16/14  Tdap: administered today  Prevnar: 12/16/14  Quantiferon: negative 12/30/14  Mammogram: not indicated  Colonoscopy: not indicated  Pap  smear - cervical: completed today  Pap smear - anal: deferred  STD screening - 12/16/14 GC/Chlamydia urine and syphilis EIA negative; BV screen and GC/Chlamydia today  Hepatitis A: TwinRx dose one administered last visit; will need next  dose next visit  Hepatitis B: TwinRx dose one administered last visit; will need next dose next visit  Hepatitis C: negative  Dental cleaning: will need to establish with dentist    Orders this encounter:  Lab   Orders Placed This Encounter   Procedures   • CBC w/Diff Lavender   • Cytopath Non Gyn   • Toxoplasma IgG Ab, Blood Yellow serum separator tube   • CMV IgG Antibody Yellow serum separator tube   • Chlamydia/GC PCR, Genital Culturette   • T Cell Subsets, Blood Lavender   • HIV-1 RNA Ultra Quant, Blood 2 Lavender   • Comprehensive Metabolic Panel Green Plasma Separator Tube   • Vaginosis Screen Sterile Container     Imaging No orders of the defined types were placed in this encounter.     Procedures   Orders Placed This Encounter   Procedures   • [place] Access Code     Other No orders of the defined types were placed in this encounter.         RETURN TO CLINIC INSTRUCTIONS  Return to clinic in 4 week(s) for follow-up with Dr. Kaufman.    Patient discussed and seen with clinic attending, Dr. Hicks.    Helen King, MD  Infectious Diseases Fellow    PATIENT EDUCATION:   Routine HIV care is important for your health.  We advise you to see your physician regularly in order to make sure that your medication is effective and that the dosing is right for you.  It is best if you have routine visits every 3 months with laboratory testing as well.  Remember to check your bottles for medication that you might be taking.  It is best to request a refill when the last refill has been dispensed on most routine medication.                              SCREENING AND COUNSELING WAS PROVIDED THAT ADDRESSED:  Adherence  Other Aspects of HIV Drug Therapy  Alcohol Abuse  Substance Abuse  Safe Sex  including STD Screening  Regular Dental Exams  HIV Transmission Risk Behaviors

## 2015-02-12 ENCOUNTER — Telehealth (HOSPITAL_BASED_OUTPATIENT_CLINIC_OR_DEPARTMENT_OTHER): Payer: Self-pay | Admitting: Infectious Disease

## 2015-02-12 LAB — CHLAMYDIA/GC PCR-GENITAL

## 2015-02-12 LAB — CHLAMYDIA/GONORRHEA PCR, GENITAL
Chlamydia trachomatis PCR: NEGATIVE
Neisseria gonorrhoeae PCR: NEGATIVE

## 2015-02-12 NOTE — Procedures (Signed)
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 Although  a normal [negative] result is highly predictive of the absence of cervical  cancer and its precursor lesions, it does not exclude the presence of  significant disease. Results must be evaluated within the context of the  individual patient.  CONFIDENTIAL HEALTH INFORMATION: Health Care information is personal and  sensitive information. If it is being faxed to you it is done so under  appropriate authorization from the patient or under circumstances that do  not require patient authorization. You, the recipient, are obligated to  maintain it in a safe, secure and confidential manner. Re-disclosure  without additional patient consent or as permitted by law is prohibited.  Unauthorized re-disclosure or failure to maintain confidentiality could  subject you to penalties described in federal and state law.  If you have  received this report or facsimile in error, please notify the [facility] immediately and destroy the received document(s).    Material reviewed and Interpreted and  Report Electronically Signed by:  Kingsley SpittleAhmed Shabaik M.D. [phone]([id number])  Attending Surgical Pathologist  02/12/15 15:48  Electronic Signature derived from a single  controlled access password

## 2015-02-12 NOTE — Progress Notes (Signed)
Received results of cervical PAP with LSIL. Order placed to colposcopy clinic. Attempted to call the patients twice this results and remind her to have her labs drawn. The line has been busy on both occasions. Will try again later today.

## 2015-02-12 NOTE — Addendum Note (Signed)
Addended by: Caryl BisKING, Evyn Kooyman on: 02/12/2015 04:16 PM     Modules accepted: Orders

## 2015-02-12 NOTE — Addendum Note (Signed)
Addended by: Caryl BisKING, Marque Bango on: 02/12/2015 08:28 AM     Modules accepted: Orders

## 2015-02-15 LAB — HPV HIGH RISK DNA PROBE, FEMALE
HPV HIGH RISK GENOTYPE 18: NEGATIVE
HPV High Risk Genotype 16: NEGATIVE
HPV OTHER HIGH RISK GENOTYPES (NOT TYPE 16 OR 18): POSITIVE — AB

## 2015-02-18 ENCOUNTER — Ambulatory Visit (HOSPITAL_BASED_OUTPATIENT_CLINIC_OR_DEPARTMENT_OTHER): Admitting: Student in an Organized Health Care Education/Training Program

## 2015-02-24 ENCOUNTER — Ambulatory Visit: Payer: MEDICAID

## 2015-02-24 NOTE — Interdisciplinary (Signed)
Pt is here to go over her benefits, and reports she was granted Medi-Cal MCE, and she was counseled about accessing services offered. Inpatient, outpatient, hospital and meds covered. We also reviewed of expectations form program and what to do if she's found not to be eligible.

## 2015-02-24 NOTE — Patient Instructions (Signed)
Return to clinic with changes in coverage

## 2015-03-17 ENCOUNTER — Ambulatory Visit: Payer: Medicaid Other | Attending: Internal Medicine | Admitting: Infectious Disease

## 2015-03-17 VITALS — BP 105/61 | HR 73 | Temp 98.7°F | Resp 17 | Ht 62.0 in | Wt 143.0 lb

## 2015-03-17 DIAGNOSIS — F1910 Other psychoactive substance abuse, uncomplicated: Secondary | ICD-10-CM | POA: Insufficient documentation

## 2015-03-17 DIAGNOSIS — Z23 Encounter for immunization: Secondary | ICD-10-CM | POA: Insufficient documentation

## 2015-03-17 DIAGNOSIS — B20 Human immunodeficiency virus [HIV] disease: Secondary | ICD-10-CM | POA: Insufficient documentation

## 2015-03-17 NOTE — Progress Notes (Signed)
[facility] VISIT    Jacqueline Fernandez is a 34 year old female with HIV (CD4 251, VL 1K on 12/30/14) diagnosed on routine testing 10/08/15 and established care with Dr. Terie Purser in Jan 2015 while incarcerated. She was started on Stribild 12/30/14 which caused significant nausea. Prior to her last visit with me (in Dr. Joice Lofts absence), she was released from the correctional facility and was staying at [facility] drug treatment facility. At our last visit, we changed ARV regimen to Complera with plans to switch to [facility] in the future. She notes improved nausea on the Complera and has not missed any doses. She does complain of having one loose stool in the morning associated with gas and abdominal discomfort that improves after having a BM. These symptoms have been going on for the past 6 months.    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 The patient was to have labs drawn the same day but did not do so. I attempted to call the patient on multiple occasions with her PAP results, referral to colposcopy clinic and to ask that she come in for the lab draw, but she was unreachable. The patient notes that her number was incorrect in our system. She notes having increased clots during her periods the past 2 months. She cannot remember the date of her LMP but notes it was sometime in March, lasted 5 days, and was very heavy.     Review of Systems:  Constitutional: denies fever, chills, night sweats, significant unintentional weight loss  Skin: denies rash, lesions, or ulcer  HEENT: denies teary/ runny eyes, runny nose, HA, sinus pain, vision changes, sore throat, nasal congestion, difficulty to swallow, or pain with swallowing  Pulmonary: denies cough, wheezing, SOB  Cardiac: denies CP, palpitation  Gastro-Intestinal/Biliary/Hepatic: denies abdominal pain, nausea/ vomiting, constipation  GU: No vaginal dyscharge, dysuria  Neurological: denies  numbness/ tingling, pain with ambulation  Musculoskeletal: denies leg or joint swelling, pain    Ongoing Problems:  Patient Active Problem List   Diagnosis    Human immunodeficiency virus (HIV) disease       ARV Adherence:  100%.    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 Released from jail 01/2015 and now in drug rehabilitation program. She has 3 children who live with her mother in [place].    No Known Allergies    Physical Exam:  BP 105/61 mmHg   Pulse 73   Temp(Src) 98.7 F (37.1 C) (Oral)   Resp 17   Ht $R'5\' 2"'sC$  (1.575 m)   Wt 64.864 kg (143 lb)   BMI 26.15 kg/m2  Body mass index is 26.15 kg/(m^2).  GEN: well developed, well nourished, NAD  HEENT: Normocephalic, atraumatic, PERRL, EOMI, oropharynx clear without thrush or oral hairy leukoplakia  LUNGS: clear to auscultation , without wheezes or rales  HEART: Regular rate and rhythm, no murmurs, rubs or gallops  ABD:  Soft, NT, ND, normactive BS  BJE: no edema/clubbing/cyanosis  NEURO: A&Ox3, appropriate mood and appearance, normal gait, CN2-12 grossly intact, no focal neurologic deficit  SKIN: dry and warm to touch    Studies  Lab Results   Component Value Date    THELPERABS 251 12/30/2014     No components found for: [facility]  Lab Results   Component Value Date    HIV1RNAULTRA 1756 12/16/2014     No  results found for: ELFY1017  No results found for: GFRAA  No results found for: WBC, RBC, HGB, HCT, MCV, MCHC, RDW, PLT, MPV, SEGS, LYMPHS, MONOS, EOS, BASOS  No results found for: INR  Lab Results   Component Value Date    HIV1RNAULTRA 1756 12/16/2014     Lab Results   Component Value Date    BUN 10 12/16/2014    CREAT 0.83 12/16/2014    CL 101 12/16/2014    NA 140 12/16/2014    K 4.2 12/16/2014    [place] 9.3 12/16/2014    TBILI 0.73 12/16/2014    ALB 4.4 12/16/2014    TP 7.1 12/16/2014    AST 25 12/16/2014    ALK 40 12/16/2014    BICARB 22 12/16/2014    ALT 26 12/16/2014    GLU 80 12/16/2014     Lab  Results   Component Value Date    AST 25 12/16/2014    ALT 26 12/16/2014    ALK 40 12/16/2014    TP 7.1 12/16/2014    ALB 4.4 12/16/2014    TBILI 0.73 12/16/2014     Lab Results   Component Value Date    CHOL 165 12/16/2014    HDL 59 12/16/2014    LDLCALC 78 12/16/2014    TRIG 140 12/16/2014     Lab Results   Component Value Date    COLORUA Straw 12/16/2014    APPEARUA Clear 12/16/2014    GLUCOSEUA Negative 12/16/2014    BILIUA Negative 12/16/2014    KETONEUA Negative 12/16/2014    SGUA 1.013 12/16/2014    BLOODUA Negative 12/16/2014    PHUA 7.0 12/16/2014    PROTEINUA Negative 12/16/2014    UROBILUA Negative 12/16/2014    NITRITEUA Negative 12/16/2014    LEUKESTUA Negative 12/16/2014    WBCUA 0-2 12/16/2014    RBCUA 0-2 12/16/2014     Lab Results   Component Value Date    THELPERABS 251 12/30/2014    HIV1RNAULTRA 1756 12/16/2014         ASSESSMENT AND PLAN  Jacqueline Fernandez is a 34 year old year old female with HIV here for f/u.    1. HIV: Recent diagnosis 09/2015, started on Stribild 12/30/14 and switched to Complera due to nausea. CD4 251/29% and VL 1756 prior to starting meds. Unfortunately, the patient did not have the ordered labs done last visit and declined to stay for labs today, since she had to get back to [facility] treatment facility by 4PM. I urged the patient to come back for labs this week, since we have not seen any labs since starting therapy 3 months ago.   - Continue Complera for now. Plan to switch to Odefsy  - CD4, viral load, CMP, CBC now     2. History of polysubstance abuse: Patient currently sober in a drug treatment facility following her incarceration. She is motivated to stay sober. Congratulated and encouraged continued sobriety.     3. Patient education - greater than 50% of visit spent counseling    4. Dental pain - tooth pulled by dentist in jail; some pain still but no evidence of abscess or infection on exam. Will provide list of dental clinics at check out.     5. Health  maintenance:  Flu shot: 12/16/14  Tdap: administered 12/30/14   Prevnar: 12/16/14  Quantiferon: negative 12/30/14  Mammogram: not indicated  Colonoscopy: not indicated  Pap smear - cervical: completed today  Pap smear - anal:  deferred  STD screening - 12/16/14 GC/Chlamydia urine and syphilis EIA negative; BV screen and GC/Chlamydia today  Hepatitis A: TwinRx 2nd dose today; 3rd dose in 3 months  Hepatitis B: TwinRx 2nd dose today; 3rd dose in 3 months  Hepatitis C: negative  Dental cleaning: will need to establish with dentist    Orders this encounter:  Lab   Orders Placed This Encounter   Procedures    CBC w/Diff Lavender    HIV-1 RNA Ultra Quant, Blood 2 Lavender    T Cell Subsets, Blood Lavender    Basic Metabolic Panel, Blood Green Plasma Separator Tube    Liver Panel, Blood Green Plasma Separator Tube    Rapid Plasma Reagin, Quantitative, Blood Yellow serum separator tube     Imaging No orders of the defined types were placed in this encounter.     Procedures No orders of the defined types were placed in this encounter.     Other   Orders Placed This Encounter   Procedures    Hepatitis A/Hepatitis B ([place])         RETURN TO CLINIC INSTRUCTIONS  Return to clinic in 1 month(s) for follow-up.    Patient discussed with clinic attending, Dr. Truman Hayward.    Doy Mince, MD  Infectious Diseases Fellow    PATIENT EDUCATION:   Routine HIV care is important for your health.  We advise you to see your physician regularly in order to make sure that your medication is effective and that the dosing is right for you.  It is best if you have routine visits every 3 months with laboratory testing as well.  Remember to check your bottles for medication that you might be taking.  It is best to request a refill when the last refill has been dispensed on most routine medication.                              SCREENING AND COUNSELING WAS PROVIDED THAT ADDRESSED:  Adherence  Drug Interactions  Substance Abuse  Safe Sex including STD  Screening  Regular Dental Exams  Nutrition Assessment  Sleeping Habits

## 2015-03-17 NOTE — Patient Instructions (Signed)
Please call the gynecologist at [facility] to schedule your appointment. Their phone number [phone].

## 2015-03-17 NOTE — Interdisciplinary (Signed)
Nurse Immunization Documentation Note    Diagnosis:     ICD-10-CM ICD-9-CM    1. Human immunodeficiency virus (HIV) disease B20 042 CBC WITH ADIFF, BLOOD      HIV-1 RNA U.QUANT/PLASMA      T CELL SUBSETS, BLOOD      BASIC METABOLIC PANEL, BLOOD      LIVER PANEL, BLOOD      RAPID PLASMA REAGIN QUANTITATIVE, BLOOD      HEP A/HEP B VACCINE, ADULT   2. Need for prophylactic vaccination against hepatitis A and hepatitis B Z23 V05.3 HEP A/HEP B VACCINE, ADULT       Jacqueline Fernandez is a 34 year old year old female, DOB 10/07/1981, allergic to Review of patient's allergies indicates no known allergies..  The patient was identified by name and date of birth. Allergies were verified.  The patient was educated about possible medication side effects and the procedure was explained to the patient.     Patient Education    Jacqueline LundJessica Rae Fernandez is a 34 year old female whose chief complaint is HIV    Filed Vitals:    03/17/15 1417   BP: 105/61   Pulse: 73   Temp: 98.7 F (37.1 C)   TempSrc: Oral   Resp: 17   Height: 5\' 2"  (1.575 m)   Weight: 64.864 kg (143 lb)     Pain Score 0  Body surface area is 1.69 meters squared.  Body mass index is 26.15 kg/(m^2).    Patient Education  Identified learning needs: Medications, Uses, Dose, Side Effects  Learner: Patient  Barriers to learning: No Barriers  Readiness to learn: Acceptance  Method: Explanation  Treatment education given: Yes  Fall prevention education given: No, steady gait  Pain education given: Yes  Response: Verbalizes understanding

## 2015-03-18 DIAGNOSIS — F1910 Other psychoactive substance abuse, uncomplicated: Secondary | ICD-10-CM

## 2015-03-18 NOTE — Progress Notes (Signed)
[facility] ATTENDING NOTE:  Pt seen and examined with Fellow. H+P, labs reviewed and confirmed by myself. Agree with A+P as per above note.    Meagan Ancona, MD #[id number]  Clinical Professor of Medicine  BP#[phone]

## 2015-04-04 NOTE — Op Note (Signed)
PATIENT NAME:  Christine Rice, Christine Rice MR#:  [id number] DATE OF BIRTH:  May 07, 1981  DATE OF PROCEDURE:  11/19/2014  PREOPERATIVE DIAGNOSIS:  Recurrent incarcerated umbilical hernia.   POSTOPERATIVE DIAGNOSIS: Recurrent incarcerated umbilical hernia.   OPERATION: Laparoscopy and repair of incarcerated recurrent umbilical hernia.   ANESTHESIA: General.   COMPLICATIONS: None.   ESTIMATED BLOOD LOSS: Minimal.   DRAINS: None.   PROCEDURE: The patient was put to sleep in the supine position on the operating table. The abdomen was prepped and draped out as a sterile field. Timeout was performed. The patient had approximately a 3 cm size hernial protrusion near the upper lip of the umbilicus which was nonreducible.  A left upper quadrant port site incision was made and a Veress needle positioned in the peritoneal cavity and verified with the hanging drop method.  A subsequent 11 mm port was placed at the site. Pneumoperitoneum was obtained and a camera was introduced with good visualization. It appeared that there was no incarceration of peritoneal contents in the hernia and it contained primarily preperitoneal fat. Accordingly it was decided to do a combination of laparoscopy and open type procedure. Two lateral 5 mm ports were placed. The skin incision was then made overlying the upper lip of the umbilicus and deepened through to expose a large peritoneal and fatty hernia of about 3-4 cm. This was then circumferentially freed down to a fascial defect which was less than a centimeter in size and excised out using cautery along with a ligature of 2-0 Vicryl. After this was freed a 6 cm size Ventralex ST patch was positioned in the peritoneal cavity and the anterior straps were then pulled up through the fascial defect about the umbilicus and the mesh placed against the abdominal wall. The edges were tacked with SecureStrap circumferentially. After ensuring proper positioning from within the pneumoperitoneum was  released and the ports were removed. The anterior straps were then transfixed to the fascial opening with a single suture of 0 Vicryl and the excess cut off. All incisions were then closed with subcuticular 4-0 Vicryl, covered with Dermabond. The procedure was well tolerated. The patient subsequently returned to the recovery room in stable condition.     ____________________________ S.Robinette Haines, MD sgs:bu D: 11/20/2014 08:28:56 ET T: 11/20/2014 16:35:36 ET JOB#: [id number]  cc: Synthia Innocent. Jamal Collin, MD, <Dictator> [facility] Robinette Haines MD ELECTRONICALLY SIGNED 11/20/2014 20:22

## 2015-04-06 ENCOUNTER — Telehealth (HOSPITAL_BASED_OUTPATIENT_CLINIC_OR_DEPARTMENT_OTHER): Payer: Self-pay

## 2015-04-06 NOTE — Telephone Encounter (Signed)
Spoke with Shanda BumpsJessica & confirmed May 10 appointment w/ Dr. Shaune LeeksAbeles to engage [facility].  Explained [facility] location.  Per pt & provider, cxl appt May 3 with Dr. Brooke DareKing.

## 2015-04-06 NOTE — Telephone Encounter (Signed)
Recd phone call from Jacqueline Fernandez [phone] (cell) who is interested in receiving [facility].  [facility] and was referred by another woman at [facility].  I s/w pt's Jacqueline Fernandez PCP, Dr. Brooke DareKing, who was supportive of pt [facility].  This is a 34 yo woman w/ HIV dx Oct 2015, 3 children & a h/o IVDU.  Attempted to reach patient to schedule case management appointment & scheduled a new patient appointment to establish care with Dr. Shaune [facility]Abeles in [facility] on May 10.

## 2015-04-14 ENCOUNTER — Ambulatory Visit (HOSPITAL_BASED_OUTPATIENT_CLINIC_OR_DEPARTMENT_OTHER): Payer: Medicaid Other | Admitting: Infectious Disease

## 2015-04-15 NOTE — Telephone Encounter (Signed)
Recd VM from Zoeya 5/1/[facility]16 requesting to r/s appt 04/21/15 from the afternoon to a morning appt.  Attempted to return Sharronda's call that afternoon, but straight to VM & VM not set up.  Attempted to reach Shanda BumpsJessica again today 04/15/15, VM not set up.

## 2015-04-20 NOTE — Telephone Encounter (Signed)
S/w Shanda BumpsJessica & confirmed she will attend her medical appt tmrw to [facility] w/ Dr. Shaune LeeksAbeles.  She does not have a ride; unfortunately the [facility]ena[facility] Niecevan is not available.  Pt has a plan to take public transportation & says she has $ for this.  I offered a return bus pass & bus pass for f/u appts.  She has never been on the bus before, but [facility] who [facility] has given her instructions on how to make it to clinic via public transit.    Pt states her bf usually provides transportation, however he is taking her to a dentist appt 04/24/15 & cannot take another day off work.

## 2015-04-21 ENCOUNTER — Encounter (HOSPITAL_BASED_OUTPATIENT_CLINIC_OR_DEPARTMENT_OTHER): Payer: Self-pay | Admitting: Infectious Diseases

## 2015-04-21 ENCOUNTER — Ambulatory Visit: Payer: Medicaid Other | Attending: Infectious Diseases | Admitting: Infectious Diseases

## 2015-04-21 ENCOUNTER — Other Ambulatory Visit: Payer: Medicaid Other | Attending: Internal Medicine

## 2015-04-21 VITALS — BP 114/69 | HR 70 | Temp 98.0°F | Resp 18 | Ht 62.0 in | Wt 137.5 lb

## 2015-04-21 DIAGNOSIS — B20 Human immunodeficiency virus [HIV] disease: Principal | ICD-10-CM | POA: Insufficient documentation

## 2015-04-21 LAB — COMPREHENSIVE METABOLIC PANEL, BLOOD
ALT (SGPT): 13 U/L (ref 0–33)
ANION GAP: 16 mmol/L — ABNORMAL HIGH (ref 7–15)
AST (SGOT): 16 U/L (ref 0–32)
Albumin: 4.5 g/dL (ref 3.5–5.2)
Alkaline Phos: 42 U/L (ref 35–140)
BILIRUBIN, TOT: 0.79 mg/dL (ref <1.20)
BUN: 11 mg/dL (ref 6–20)
Bicarbonate: 22 mmol/L (ref 22–29)
CALCIUM: 9.1 mg/dL (ref 8.5–10.6)
Chloride: 104 mmol/L (ref 98–107)
Creatinine: 0.83 mg/dL (ref 0.51–0.95)
GFR: >60 mL/min
Glucose: 88 mg/dL (ref 70–115)
Potassium: 4.2 mmol/L (ref 3.5–5.1)
Sodium: 142 mmol/L (ref 136–145)
TOTAL PROTEIN: 7.3 g/dL (ref 6.0–8.0)

## 2015-04-21 LAB — CBC WITH DIFF, BLOOD
ANC-Automated: 3.2 10*3/uL (ref 1.6–7.0)
Abs Eosinophils: 0.1 10*3/uL (ref 0.1–0.7)
Abs Lymphs: 1.2 10*3/uL (ref 0.8–3.1)
Abs Monos: 0.4 10*3/uL (ref 0.2–0.8)
Eosinophils: 2 % (ref 1–4)
Hct: 37.8 % (ref 34.0–45.0)
Hgb: 12.9 gm/dL (ref 11.2–15.7)
Lymphocytes: 24 % (ref 19–53)
MCH: 30.1 pg (ref 26.0–32.0)
MCHC: 34.1 % (ref 32.0–36.0)
MCV: 88.3 um3 (ref 79.0–95.0)
MPV: 11 fL (ref 9.4–12.4)
Monocytes: 8 % (ref 5–12)
Plt Count: 190 10*3/uL (ref 140–370)
RBC: 4.28 10*6/uL (ref 3.90–5.20)
RDW: 12.3 % (ref 12.0–14.0)
Segs: 66 % (ref 34–71)
WBC: 4.9 10*3/uL (ref 4.0–10.0)

## 2015-04-21 LAB — BILIRUBIN, DIR BLOOD: Bilirubin, Dir: <0.2 mg/dL (ref <0.2)

## 2015-04-21 MED ORDER — EMTRICITAB-RILPIVIR-TENOFOV AF 200-25-25 MG PO TABS
1.0000 | ORAL_TABLET | Freq: Every day | ORAL | Status: DC
Start: 2015-04-21 — End: 2015-08-25
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
## 2015-04-21 NOTE — Interdisciplinary (Addendum)
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  CD4 251 & VL 1756 on 12/30/14.  Repeat labs today.  Seen by Dr. Brooke DareKing in [place] & transferring care today to Dr. Shaune LeeksAbeles [place].  Pt unsure how or when she contracted HIV, but has a h/o heterosexual contact & IDU.  Pt states she does not recall ever having an HIV test prior to testing positive, although she assumes she had one during pregnancies in [place].  Dental appt sch 04/24/15.  Pt w/ lmtd understanding of HIV; provided health edu. States she has never missed a dose of ART since starting.  Per MD, [facility] (pt to p/u @ [facility]).  Provided pt w/ # [facility].  Pt had tubal ligation in 2010 per MD.  Pt states she has been w/ her bf x 3 yrs & he is sober d/t drug court involvement.  She states he has tested negative & they do not use condoms because "he's never gotten it."  Reviewed risk & use of condoms, also informed about PrEP.  Encouraged routine testing for partner.  RTC 05/19/15.    Mental Health Status: Tearful today.  Currently in [facility] substance [facility]; time counts towards her jail sentence.  Pt has never been in treatment before but shared, "It's time, I have children."  H/o ETOH abuse & IV meth use.  Pt states she has 211 days sober today (since 09/25/14).  Pt assumes she was HIV tested during her pregnancies, but she is not sure & becomes tearful thinking about possible exposure to children.  Reviewed pt can request this lab result from her most recent pregnancy or have children tested.  Children live w/ her mother who does not know [name] HIV status.  Pt shared she was treated w/ psychotropics as a teen, but does not believe in them now.  Reviewed  [facility] & oriented pt to program.    Family/Support System: 3 children (6, 7, 528 yo) who live in [place] w/ pt's mom.  [name] mom also has h/o incarceration.  Jacqueline Fernandez has a goal of reuniting w/ children after she completes treatment & obtains housing & employment.  Her bf is employed & aware of her HIV status.  She prefers to find independent housing, not live w/ bf.    Language Spoken: ENG    Religion/ Spiritual Beliefs: Not assessed today.    Financial Resources: No income.  [facility] June 10 & does not have plan for housing.  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  She will soon receive job readiness services from [employer].   Per their website, it looks like they also offer housing support.  Medi-Cal w/ [employer].  HS graduate.  Requested assistance w/ housing; offered to assist pt w/ Sec 8/HOPWA online application, however pt declined.  Offered [facility] transportation today, however pt declined & opted to take bus for first time.  Gave bus pass to get home today & another to return on June 7.    Plan:   - Labs today  - P/u new medication today  - Pt identified goals:  job, housing & reunifying w/ children  - Pt to schedule [facility] appt  - Dentist 04/24/15  - RTC 05/19/15  - Housing plan needed    Paperwork:  - Care plan 04/21/15  - SAMISS 04/21/15  - ARIES consent 04/21/15  - Consent & rights 04/21/15  - Registration form 04/21/15    Referrals: [facility]    Substance Abuse    1. How often do you have a drink containing alcohol?       [0] Never [1] Monthly of less [2] 2-4 times/mo [3] 2-3 times/wk [4] 4+ times/wk      RESPONSE: 0    2. How many drinks do you have on a typical day when you are drinking?      [0] None [1] 1 or 2 [2] 3 or 4 [3] 5 or 6 [4] 7-9 [5] 10+      RESPONSE: 0    3. How often do you have 4 or more drinks on 1 occasion?      [0] Never [1] Less than monthly [2] Monthly [3] Weekly [4] Daily or almost daily       RESPONSE: 0    Total for Q1-3:  0  [Note: score of 5+ indicates positive screen]    4. In the past year, how often did you use nonprescription drugs to get high or to change the way you feel?      [0] Never [1] Less than monthly [2] Monthly [3] Weekly [4] Daily or almost daily       RESPONSE: 0      [Note: score of 3+ indicates positive screen]    5. In the past year, how often did you use drugs prescribed to you or to someone else to get high or change the way you feel?      [0] Never [1] Less than monthly [2] Monthly [3] Weekly [4] Daily or almost daily       RESPONSE: 0      [Note: score of 3+ indicates positive screen]    6. In the past year, how often did you drink or use drugs more than you meant to?      [0] Never [1] Less than monthly [2] Monthly [3] Weekly [4] Daily or almost daily       RESPONSE: 0      [Note: score of 1+ indicates positive screen]    7. How often did you feel you wanted or needed to cut down on your drinking or drug use in the past year, and were not able to?      [0] Never [1] Less than monthly [2] Monthly  [3] Weekly [4] Daily or almost daily      RESPONSE: 0      [Note: score of 1+ indicates positive screen]    Mental Illness  [Note: YES response for Q8-16 indicates positive screen]    8. In the past year, when not high or intoxicated, did you ever feel extremely energetic or irritable and more talkative than usual?   [  ] YES  [ X ] NO    9. In the past year, were you ever on medication or antidepressants for depression or nerve problems?   [  ] YES  [ X ] NO    10. In the past year, was there ever a time when you felt sad, blue, or depressed for more than 2 weeks in a row?   [  ] YES  [ X ]  NO    11. In the past year, was there ever a time lasting more than 2 weeks when you lost interest in most things like hobbies, work, or activities that usually give you pleasure?   [  ] YES  [ X ] NO    12. In the past year, did you ever have a period lasting more than 1 month when most of the time you felt worried and anxious?    [  ] YES  [ X ] NO    13. In the past year, did you have a spell or an attack when all of a sudden you felt frightened, anxious, or very uneasy when most people would not be afraid or anxious?   [  ] YES  [ X ] NO    14. In the past year, did you ever have a spell or an attack when for no reason your heart suddenly started to race, you felt faint, or you couldnt catch your breath?   [  ] YES  [ X ] NO   If yes, please explain:     15. During your lifetime, as a child or adult, have you experienced or witnessed traumatic event(s) that involved harm to yourself or to others?   [  ] YES  [ X ] NO   If yes, in the past year, have you been troubled by flashbacks, nightmares, or thoughts of the trauma?   [  ] YES  [ X ] NO    16. In the past 3 months, have you experienced any event(s) or received information that was so upsetting it affected how you cope with everyday life?   [  ] YES  [ X ] NO

## 2015-04-21 NOTE — Interdisciplinary (Signed)
Patient Education    Jacqueline LundJessica Rae Fernandez is a 34 year old female whose chief complaint is New Patient    Filed Vitals:    04/21/15 1312   BP: 114/69   Pulse: 70   Temp: 98 F (36.7 C)   TempSrc: Oral   Resp: 18   Height: 5\' 2"  (1.575 m)   Weight: 62.37 kg (137 lb 8 oz)   SpO2: 98%     Pain Score 0  Body surface area is 1.65 meters squared.  Body mass index is 25.14 kg/(m^2).    Patient Education  Identified learning needs: Diagnosis, Care Plan, Treatment  Learner: Patient  Barriers to learning: No Barriers  Readiness to learn: Acceptance  Method: Explanation  Treatment education given: No  Fall prevention education given: Yes  Pain education given: Yes  Response: Verbalizes understanding

## 2015-04-22 LAB — T CELL SUBSETS, BLOOD
CD4+ T-Cell %: 36 % (ref 29–61)
CD4+ T-Cell Abs: 468 cells/uL (ref 250–1200)
CD4:CD8 RATIO: 0.87 (ref 0.70–4.00)
CD8+ T-CELL ABS: 539 cells/uL (ref 100–800)
CD8+ T-Cell %: 41 % — ABNORMAL HIGH (ref 11–38)

## 2015-04-22 LAB — RAPID PLASMA REAGIN QUANTITATIVE, BLOOD: RPR Quantitative: NONREACTIVE

## 2015-04-23 LAB — CMV IGG ANTIBODY, BLOOD: CMV IGG Ab: POSITIVE — AB

## 2015-04-23 LAB — TOXOPLASMA IGG, BLOOD: Toxoplasma IGG Ab: NEGATIVE

## 2015-04-23 LAB — HIV-1 RNA QUANT/PLASMA: HIV-1 RNA Ultra Quant, Plasma: NOT DETECTED copy/mL

## 2015-04-30 ENCOUNTER — Telehealth (HOSPITAL_BASED_OUTPATIENT_CLINIC_OR_DEPARTMENT_OTHER): Payer: Self-pay

## 2015-04-30 NOTE — Telephone Encounter (Signed)
Left VM for Jacqueline Fernandez to f/u on visit last week.  Pt newly dx & in treatment at [facility].  Pt completed labs at visit 04/21/15, VL undetectable.  Pt was to p/u new medication [facility](Odefsey) that day.  Pt has not yet scheduled [facility] appt.  Pt was scheduled to attend dental appt 04/24/15.    Plan:   - P/u Odefsey 04/21/15  - Get ROI for [facility]   - Pt identified goals: job, housing & reunifying w/ children  - Pt to schedule [facility] appt  - Dentist 04/24/15  - RTC 05/19/15  - Housing plan needed

## 2015-05-01 ENCOUNTER — Telehealth (HOSPITAL_BASED_OUTPATIENT_CLINIC_OR_DEPARTMENT_OTHER): Payer: Self-pay

## 2015-05-01 NOTE — Telephone Encounter (Signed)
[facility], chart to schedulers

## 2015-05-01 NOTE — Telephone Encounter (Signed)
S/w Jacqueline Fernandez.  She is still at [facility].  [facility].  She's not able to receive texts at this time, but plans to get a new phone Sun 05/03/15.  She attended her dental appt 04/24/15.  Assisted pt in contacting [facility] re colpo; they will review order & contact myself or pt to schedule.  Pt inquired about CD4/VL results from 04/21/15; informed pt she has an undetectable VL & her CD4 is 468.  Pt was happy to hear this news.  Reminded pt of f/u 05/19/15, she will submit leave request to [facility].

## 2015-05-01 NOTE — Telephone Encounter (Signed)
Pt has internal referral for colpo-pls contact case worker Joni Reiningicole @ [phone] for scheduling

## 2015-05-04 ENCOUNTER — Telehealth (HOSPITAL_BASED_OUTPATIENT_CLINIC_OR_DEPARTMENT_OTHER): Payer: Self-pay

## 2015-05-04 NOTE — Telephone Encounter (Signed)
Recd phone call from Shanda BumpsJessica to provide her new #[phone].  Assisted pt in contacting [facility] to f/u on scheduling colpo; nurse who reviews orders was not in & they requested pt call back after 1PM.  I provided pt w/ # so she could call back.    Pt shared she will [facility] & plans to move in w/ her bf's mom in [place].

## 2015-05-05 NOTE — Telephone Encounter (Signed)
Patient scheduled.

## 2015-05-06 NOTE — Telephone Encounter (Signed)
Recd [facility] from [facility] @ [facility]; they scheduled next available colpo 05/22/15 (colpo clinic Fridays only).      I attempted to reach Jacqueline Fernandez at the # she gave me 05/04/15 [phone], however the VM belongs to someone else.  I did not leave a message.  Instead called & left a msg @ [facility] asking that Jacqueline Fernandez call CobdenNicole @ [place] re an upcoming medical visit.

## 2015-05-06 NOTE — Telephone Encounter (Signed)
Recd VM from pt from [phone].  Attempted to call back, ring no answer.

## 2015-05-06 NOTE — Telephone Encounter (Signed)
S/w Shanda BumpsJessica.  She thinks she has the flu; [facility].  Informed of colpo appt.

## 2015-05-14 NOTE — Telephone Encounter (Signed)
Recd VM from pt stating she cannot attend primary care appt w/ Dr. Shaune LeeksAbeles 05/19/15 d/t a course she is taking.  Requested to r/s after 05/29/15.  I attempted to return pt's call, ring no answer.  I r/s appt from 05/19/15 to 06/02/15 230PM.  Colpo sch 05/22/15.    [place] PCP: Dr. Shaune LeeksAbeles  Last primary care visit: 04/21/15  Follow-up: 06/02/15  CD4/VL: 467/Not Detected 04/21/15  HIV Dx: Oct 2015

## 2015-05-19 ENCOUNTER — Ambulatory Visit (HOSPITAL_BASED_OUTPATIENT_CLINIC_OR_DEPARTMENT_OTHER): Payer: Medicaid Other | Admitting: Infectious Disease

## 2015-05-19 ENCOUNTER — Ambulatory Visit (HOSPITAL_BASED_OUTPATIENT_CLINIC_OR_DEPARTMENT_OTHER): Payer: Medicaid Other | Admitting: Infectious Diseases

## 2015-05-19 NOTE — Telephone Encounter (Signed)
[facility] requesting to cxl appt today w/ Dr. Shaune LeeksAbeles.  Pt left me VM 05/14/15 stating she could not attend appt today d/t class she is taking.  I have been trying to reach pt since then, VM not set up.  Informed pt I had cxl appt today per her request & r/s 06/02/15 as pt stated class ended 05/29/15.  Pt c/o respiratory issues & ongoing cough & requested urgent appt.  Sch 05/21/15 1130AM @ [facility] [place] w/ Dr. Shaune LeeksAbeles.  Pt completes [facility] treatment Tuesday 05/26/15 & plans to move in w/ bf's mom.  Have attempted to engage pt in housing plan.

## 2015-05-21 ENCOUNTER — Ambulatory Visit (HOSPITAL_BASED_OUTPATIENT_CLINIC_OR_DEPARTMENT_OTHER): Payer: Medicaid Other | Admitting: Infectious Diseases

## 2015-05-22 ENCOUNTER — Ambulatory Visit (HOSPITAL_BASED_OUTPATIENT_CLINIC_OR_DEPARTMENT_OTHER): Payer: Medicaid Other | Admitting: Gynecology

## 2015-06-01 ENCOUNTER — Telehealth (HOSPITAL_BASED_OUTPATIENT_CLINIC_OR_DEPARTMENT_OTHER): Payer: Self-pay

## 2015-06-01 NOTE — Telephone Encounter (Signed)
Attempted to reach Naiovy [phone], # does not seem to be working.  Pt missed urgent medical visit 05/21/15 & colpo 05/22/15.  Pt was scheduled to complete treatment at [facility] 05/26/15 w/ a plan to move to bf's mom's.  Scheduled for primary care f/u tmrw 06/02/15 w/ Dr. Shaune Leeks in [place].

## 2015-06-02 ENCOUNTER — Ambulatory Visit (HOSPITAL_BASED_OUTPATIENT_CLINIC_OR_DEPARTMENT_OTHER): Payer: Medicaid Other | Admitting: Infectious Diseases

## 2015-06-02 NOTE — Progress Notes (Signed)
The patient missed the appointment.

## 2015-06-09 ENCOUNTER — Telehealth (HOSPITAL_BASED_OUTPATIENT_CLINIC_OR_DEPARTMENT_OTHER): Payer: Self-pay

## 2015-06-09 NOTE — Telephone Encounter (Signed)
Recd VM from [name] [phone].  Pt stated she had been incarcerated & did not receive antiretrovirals during this time.  I attempted to return call; ring no answer.

## 2015-06-09 NOTE — Telephone Encounter (Signed)
S/w Jacqueline Fernandez.  [place] [facility] [place] [facility] [place] [facility] & was not given medication.  She got home yesterday & resumed her antiretrovirals.  Assisted pt in scheduling medical f/u w/ PCP Dr. Shaune [place]Abeles in [place] 06/23/15 4PM.    She was arrested when she checked in w/ PO after graduating from [facility] because she had not been checking in while she was at [facility]; she did not know she had to check in w/ PO while she in residential treatment.  All of her legal issues are now resolved.    She has a sponsor & reports she remains sober & dedicated to doing so.  She's receiving job readiness training thru [employer] & is living w/ bf's mom.

## 2015-06-22 NOTE — Telephone Encounter (Signed)
S/w Shanda BumpsJessica & confirmed she will [facility] w/ Dr. Shaune LeeksAbeles.

## 2015-06-23 ENCOUNTER — Other Ambulatory Visit: Payer: Medicaid Other | Attending: Infectious Diseases

## 2015-06-23 ENCOUNTER — Ambulatory Visit: Payer: Medicaid Other | Attending: Infectious Diseases | Admitting: Infectious Diseases

## 2015-06-23 VITALS — BP 107/53 | HR 64 | Temp 99.3°F | Resp 20 | Ht 62.0 in | Wt 138.0 lb

## 2015-06-23 DIAGNOSIS — B20 Human immunodeficiency virus [HIV] disease: Secondary | ICD-10-CM | POA: Insufficient documentation

## 2015-06-23 DIAGNOSIS — N926 Irregular menstruation, unspecified: Secondary | ICD-10-CM | POA: Insufficient documentation

## 2015-06-23 DIAGNOSIS — R87612 Low grade squamous intraepithelial lesion on cytologic smear of cervix (LGSIL): Principal | ICD-10-CM | POA: Insufficient documentation

## 2015-06-23 DIAGNOSIS — Z23 Encounter for immunization: Secondary | ICD-10-CM | POA: Insufficient documentation

## 2015-06-23 LAB — TSH, BLOOD: TSH: 2.35 u[IU]/mL (ref 0.27–4.20)

## 2015-06-23 NOTE — Progress Notes (Signed)
[address] VISIT    Patient ID:  Jacqueline Fernandez is a 34 year old woman with HIV (CD4 251, VL 1K on 12/30/14) diagnosed on in jail 10/07/14. Started Stribild 12/30/14 but caused nausea, switched to Complera March 2016, then [facility] 04/2015.     CC: Follow-up HIV    HPI: Takes Odefsy each morning with breakfast. Missed several doses when in jail (was arrested for not making appts for probabion when in rehab, was in rehab for condisions of her probation. When in jail, she was not given her meds and her CM was not contacted June 15-24).      Now taking 5pm each night - no missed doses. Sober x 9 mo. Has seen dentist. Missed colposcopy due to rehab.  Has questions about change in quality of her period x months - lighter pink.  Also brings up stress around telling people she has HIV, having to tell her mother (doesn't want to disappoint etc). Discussed at length.      PMH:   HIV  LGSIL cervix  History of Polysubstance abuse    PSH:  Tubal ligation 01/25/2009    Review of Systems:  Constitutional: denies fever, chills, night sweats, unintentional weight loss  Skin: denies rash, lesions, or ulcer  HEENT: denies teary/ runny eyes, runny nose, HA, sinus pain, vision changes, sore throat, nasal congestion, difficulty to swallow, or pain with swallowing  Pulmonary: denies cough, wheezing, SOB  Cardiac: denies CP, palpitation  Gastro-Intestinal/Biliary/Hepatic: denies abdominal pain, nausea/ vomiting, constipation  GU: No vaginal dyscharge, dysuria  Neurological: denies numbness/ tingling, pain with ambulation  Musculoskeletal: denies leg or joint swelling, pain      Current Outpatient Prescriptions on File Prior to Visit   Medication Sig Dispense Refill    emtricitabine-rilpivirine-tenofovir alafenamide (ODEFSEY) 200-25-25 MG TABS tablet Take 1 tablet by mouth daily (with food). 30 tablet 3     No current facility-administered medications on file prior to visit.        Psychosocial:  History of smoking and injecting  meth and alcohol abuse until recent incarceration in late 2015. Released from jail 01/2015 and completed rehabilitation program.   STarted using meth at age 47. Used until 21, then started shooting it.   Thinks got HIV from sharing needles.  Used to use meth but more recently was drinking whiskey a lot on an empty stomach.   Has an 34yo girl , 34yo and 34yo boys whp stay with her mom.  Mother has grandparents rights (she may have custody because patient is out of state).  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 Also has boyfriend     From here, moved out to [place] with her mom at age 39 ([place] Lived there for 9 years, then came back here to escape friends.  Wants   Not working now [place] [employer]. Was drunk and arrested for drunk in public. 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  Found out she had HIV.    No Known Allergies    Physical Exam:  BP 107/53  Pulse 64  Temp 99.3 F (37.4 C) (Oral)  Resp 20  Ht $R'5\' 2"'pk$  (1.575 m)  Wt 62.6 kg (138 lb)  BMI 25.24 kg/m2  Body mass  index is 25.24 kg/(m^2).  GEN: well developed, well nourished, NAD  HEENT: Normocephalic, atraumatic,  EOMI, oropharynx clear without thrush or oral hairy leukoplakia  BJE: no edema/clubbing  NEURO: A&Ox3, grossly wnl  Psych: linear thought process, appropriate  SKIN: dry and warm to touch, sunburnt    Studies  Lab Results   Component Value Date    THELPERABS 468 04/21/2015    THELPERABS 251 12/30/2014     No components found for: THELPER  Lab Results   Component Value Date    HIV1RNAULTRA Not Detected 04/21/2015     No results found for: HLAB5701  No results found for: GFRAA  Lab Results   Component Value Date    WBC 4.9 04/21/2015    RBC 4.28 04/21/2015    HGB 12.9 04/21/2015    HCT 37.8 04/21/2015    MCV 88.3 04/21/2015    MCHC 34.1 04/21/2015    RDW 12.3  04/21/2015    PLT 190 04/21/2015    MPV 11.0 04/21/2015    LYMPHS 24 04/21/2015    MONOS 8 04/21/2015    EOS 2 04/21/2015     No results found for: INR  Lab Results   Component Value Date    HIV1RNAULTRA Not Detected 04/21/2015     Lab Results   Component Value Date    BUN 11 04/21/2015    CREAT 0.83 04/21/2015    CL 104 04/21/2015    NA 142 04/21/2015    K 4.2 04/21/2015    Barron 9.1 04/21/2015    TBILI 0.79 04/21/2015    ALB 4.5 04/21/2015    TP 7.3 04/21/2015    AST 16 04/21/2015    ALK 42 04/21/2015    BICARB 22 04/21/2015    ALT 13 04/21/2015    GLU 88 04/21/2015     Lab Results   Component Value Date    AST 16 04/21/2015    ALT 13 04/21/2015    ALK 42 04/21/2015    TP 7.3 04/21/2015    ALB 4.5 04/21/2015    TBILI 0.79 04/21/2015    DBILI <0.2 04/21/2015     Lab Results   Component Value Date    CHOL 165 12/16/2014    HDL 59 12/16/2014    LDLCALC 78 12/16/2014    TRIG 140 12/16/2014     Lab Results   Component Value Date    COLORUA Straw 12/16/2014    APPEARUA Clear 12/16/2014    GLUCOSEUA Negative 12/16/2014    BILIUA Negative 12/16/2014    KETONEUA Negative 12/16/2014    SGUA 1.013 12/16/2014    BLOODUA Negative 12/16/2014    PHUA 7.0 12/16/2014    PROTEINUA Negative 12/16/2014    UROBILUA Negative 12/16/2014    NITRITEUA Negative 12/16/2014    LEUKESTUA Negative 12/16/2014    WBCUA 0-2 12/16/2014    RBCUA 0-2 12/16/2014     Lab Results   Component Value Date    THELPERABS 468 04/21/2015    HIV1RNAULTRA Not Detected 04/21/2015     Path:  - LGSIL on pap smear      ASSESSMENT AND PLAN  Jacqueline Fernandez is a 34 year old year old woman with HIV here for f/u.    1. HIV: Diagnosed 09/2015, started on Stribild 12/30/14 and switched to [facility]era due to nausea. CD4 251/29% and VL 1756 prior to starting meds. No subsequent labs.  - Labs today  - cont Odefsey  - gave support re disclosure to others -  discussed at length    # History of polysubstance abuse: Patient currently sober in a drug treatment facility following her  incarceration. She is motivated to stay sober. Encouraged    #LGSIL:  Needs to schedule colpo.  Rereferred today    #Social:  Working with CM on subsequent housing. Hopes to get a job. Monogamous.     # Health maintenance:  Flu shot: 12/16/14  Tdap: administered 12/30/14   Prevnar: 12/16/14  Quantiferon: negative 12/30/14   Mammogram: not indicated  Colonoscopy: not indicated  Pap smear - cervical: completed today  Pap smear - anal: deferred  STD screening - 12/16/14 GC/Chlamydia urine and syphilis EIA negative; BV screen and GC/Chlamydia today  Hepatitis A: Last dose today  Hepatitis B: Last dose today  Hepatitis C: negative  Dental cleaning: seeing dentist    Follow-up 2 months

## 2015-06-23 NOTE — Patient Instructions (Signed)
Lab Results   Component Value Date    QUANTIFERON Negative 12/30/2014     To Do -   - labs today  - keep taking Odefsey same time every day with some food.  - vaccine today  - Please make your appointment with [facility] for colposcopy  Come back in 2 months

## 2015-06-23 NOTE — Interdisciplinary (Signed)
Nurse Immunization Documentation Note    Diagnosis:     ICD-10-CM ICD-9-CM    1. Low grade squamous intraepithelial lesion (LGSIL) on Papanicolaou smear of cervix R87.612 795.03 HIV-1 RNA U.QUANT/PLASMA      [facility]   2. Human immunodeficiency virus (HIV) disease B20 042 HIV-1 RNA U.QUANT/PLASMA      [facility]      QUANTIFERON-TB, BLOOD   3. Need for prophylactic vaccination against hepatitis A and hepatitis B Z23 V05.3 HEP A/HEP B VACCINE, ADULT   4. Abnormal menstrual periods N92.6 626.2 TSH, BLOOD       Jacqueline Fernandez is a 34 year old year old female, DOB 1981/03/18, allergic to Review of patient's allergies indicates no known allergies..  The patient was identified by name and date of birth. Allergies were verified.  The patient was educated about possible medication side effects and the procedure was explained to the patient.     Patient Education    Jacqueline Fernandez is a 34 year old female whose chief complaint is HIV    Vitals:    06/23/15 1614   BP: 107/53   Pulse: 64   Resp: 20   Temp: 99.3 F (37.4 C)   TempSrc: Oral   Weight: 62.6 kg (138 lb)   Height: 5\' 2"  (1.575 m)     Pain Score 0  Body surface area is 1.65 meters squared.  Body mass index is 25.24 kg/(m^2).    Patient Education  Identified learning needs: Medications, Uses, Dose, Side Effects  Learner: Patient  Barriers to learning: No Barriers  Readiness to learn: Acceptance  Method: Explanation and Handout  Treatment education given: No  Fall prevention education given: No  Pain education given: No  Response: Verbalizes understanding

## 2015-06-24 LAB — HIV-1 RNA QUANT/PLASMA: HIV-1 RNA Ultra Quant, Plasma: NOT DETECTED copy/mL

## 2015-06-24 NOTE — Interdisciplinary (Signed)
Reason for encounter/visit: Social Work    Presenting Problem: Met with Jacqueline Fernandez at her primary care f/u with Dr. Raquel James in [place].      Health Status: Jacqueline Fernandez is a 34 yo white female, newly dx w/ HIV (Oct 2015); pt was tested for HIV in custody after spitting at an officer while intoxicated & charged w/ multiple felonies. CD4 468 on 04/21/15 & VL not detected 06/23/15. Pt unsure how or when she contracted HIV, but has a h/o heterosexual contact & IDU.  Today pt shared concern about her children's HIV status; they are 6, 7 & 71 yo, all born in [place].  Provided health edu that pregnanct women should be universally tested for HIV, pt would like to confirm this so I assisted her in requesting records from her most recent pregnancy at [facility] in [place], [place].  Endorses excellent ARV adherence on Odefsy.  She was incarcerated for 10 days recently & was not given ARVs despite disclosing HIV status.  Missed her [facility] colpo appt & needs to r/s.  T/L 2010 per records obtained from [facility], [place].  Unprotected sex w/ bf, will cont to provide health edu & encourage repeat partner testing.  Last dental appt 04/24/15.    Mental Health Status: Completed residential treatment at [facility] 05/26/15.  Acknowledges cravings to drink.  She has a sponsor she s/w daily & attends meetings.  Declined addtl support services.  H/o ETOH abuse & IV meth use. Recd 9 mo sobriety token today 06/23/15.  On w/ psychotropics as a teen, but does not believe in them now.    Family/Support System: 3 children (6, 55, 86 yo) who live in [place] w/ pt's mom. She has not disclosed to her mom yet.  Per MD, pt shared she has been disclosing to many people.  Jacqueline Fernandez's mom also has h/o incarceration. Jacqueline Fernandez has a goal of reuniting w/ children.  Her bf of 3 yrs is employed & aware of her HIV status. She is living w/ him & his mom currently.  BF is sober & involved in drug court.    Language Spoken: ENG    Religion/ Spiritual Beliefs: Not assessed  today.    Financial Resources: No income. Job training services thru [employer].  She would like to get her own housing.  Medi-Cal w/ Estevan Ryder. HS graduate. Public transportation.    Plan:   - Labs today  - Pt identified goals: job, housing & reunifying w/ children  - Pt to schedule [facility] appt  - RTC 08/25/15  - Housing plan needed    Paperwork:  - Care plan 04/21/15  - SAMISS 04/21/15  - ARIES consent 04/21/15  - Consent & rights 04/21/15  - Registration form 04/21/15    Referrals: [facility]

## 2015-08-19 ENCOUNTER — Telehealth (HOSPITAL_BASED_OUTPATIENT_CLINIC_OR_DEPARTMENT_OTHER): Payer: Self-pay

## 2015-08-19 NOTE — Telephone Encounter (Signed)
S/w [name] [phone].  Pt was at work when we spoke.  She started new job at [employer].  She is taking an aftercare online class on Wednesdays.  Reports having mild cold.  Inquired about HIV testing results during pregnancy which we requested from [place].  Pt has not yet sch her [facility] appt, but shared she's "worried about HPV" (pt ref for LGSIL).  Offered to assist pt in calling [facility] to schedule, however pt declined since she was at work.  We agreed to call together to schedule at her primary care appt on Tues 08/25/15 w/ Dr. Shaune Leeks in [place].

## 2015-08-21 ENCOUNTER — Telehealth (HOSPITAL_BASED_OUTPATIENT_CLINIC_OR_DEPARTMENT_OTHER): Payer: Self-pay

## 2015-08-21 NOTE — Telephone Encounter (Signed)
Pt signed ROI to obtain medical records from [facility] 06/23/15 for delivery of her youngest son.  Pt wants to ensure son was not exposed to HIV as she is not sure when she became positive.  Records were recd, with lab flow sheet that incl HIV done 12/03/08, however actual antibody lab was not provided.  It looks like "neg" is handwritten in flow sheet but difficult to decipher.      S/w [facility] [phone] to request lab result.  They do not have it at the hospital where pt delivered & suggested I call OB's ofc: Dr. Kathryne Hitch #[phone].  I will request ROI from pt at her visit 08/25/15 in order to request this lab result.

## 2015-08-25 ENCOUNTER — Ambulatory Visit: Payer: Medicaid Other | Attending: Infectious Diseases | Admitting: Infectious Diseases

## 2015-08-25 ENCOUNTER — Other Ambulatory Visit (HOSPITAL_BASED_OUTPATIENT_CLINIC_OR_DEPARTMENT_OTHER): Payer: Self-pay | Admitting: Infectious Diseases

## 2015-08-25 VITALS — BP 110/56 | HR 75 | Temp 98.3°F | Resp 20 | Ht 62.0 in | Wt 129.0 lb

## 2015-08-25 DIAGNOSIS — N879 Dysplasia of cervix uteri, unspecified: Secondary | ICD-10-CM | POA: Insufficient documentation

## 2015-08-25 DIAGNOSIS — R87612 Low grade squamous intraepithelial lesion on cytologic smear of cervix (LGSIL): Secondary | ICD-10-CM | POA: Insufficient documentation

## 2015-08-25 DIAGNOSIS — F1511 Other stimulant abuse, in remission: Secondary | ICD-10-CM

## 2015-08-25 DIAGNOSIS — B20 Human immunodeficiency virus [HIV] disease: Principal | ICD-10-CM | POA: Insufficient documentation

## 2015-08-25 DIAGNOSIS — Z87898 Personal history of other specified conditions: Secondary | ICD-10-CM | POA: Insufficient documentation

## 2015-08-25 DIAGNOSIS — Z23 Encounter for immunization: Secondary | ICD-10-CM | POA: Insufficient documentation

## 2015-08-25 DIAGNOSIS — F1011 Alcohol abuse, in remission: Secondary | ICD-10-CM | POA: Insufficient documentation

## 2015-08-25 MED ORDER — EMTRICITAB-RILPIVIR-TENOFOV AF 200-25-25 MG PO TABS
1.0000 | ORAL_TABLET | Freq: Every day | ORAL | 6 refills | Status: DC
Start: 2015-08-25 — End: 2016-01-19

## 2015-08-25 NOTE — Interdisciplinary (Signed)
Reason for encounter/visit: Social Work    Presenting Problem: Met with Jacqueline Fernandez at her primary care f/u with Dr. Raquel Fernandez in [place]. Pt introduced to Jacqueline Edin, LCSW who will provide ongoing case management support.    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  Pt unsure how or when she contracted HIV, but has a h/o heterosexual contact & IDU.  F/b Dr. Raquel Fernandez in [place]; last visit 06/23/15, f/u 10/27/15.  CD4 468 on 04/21/15 & VL not detected 06/23/15.  Labs reviewed, pt tearful.  Health edu provided.  Able to ID ARV by name Jacqueline Fernandez).  MD provided pill box today.  Her bf is aware of HIV dx & is receiving routine HIV tests through [employer].  Safer sex edu provided, condoms offered.  Pt previously concerned about exposure to her children; we obtained medical records from pt's last delivery indicating HIV-negative test Dec 2009.  Shared w/ pt today, pt tearful & reports feeling relieved.  We recd delivery & tubal ligation records.  Pt signed ROI today for obstetrician to confirm HIV lab result.  Assisted pt in sch colpo appt 09/25/15 2PM.  Pt became tearful & shared she was "scared I have cancer."  Basic health edu provided re LGSIL & colpo procedure.  Last dental appt 04/24/15, 2 teeth extracted.    Mental Health Status: Completed residential treatment at [facility] 05/26/15. H/o ETOH abuse & IV meth use. Recd 9 mo sobriety token 06/23/15; however today shared she drank a pitcher of beer during [employer] & has been sober since. She has a sponsor & attends meetings. Offered addtl sub use services & pt declined.  On psychotropics as a teen, but does not believe in them now.      Family/Support System: 3 children (6, 35, 69 yo) who live in [place] w/ pt's mom. She has not disclosed to her mom yet.  Mom has a h/o substance use & incarceration, Jacqueline Fernandez thinks she is sober not but sure.  Jacqueline Fernandez has a goal of reuniting w/ children; tearful when discussing.  "I want them back now,  I have anxiety about it."  Her mom would like her to take children back.  Jacqueline Fernandez cannot cross state lines d/t parole.  She would like to be financially secure & independently housed before reuniting w/ children.  Her bf of 3 yrs is employed Engineer, drilling). She is living w/ him & his mom currently.  Pt shared bf is sober & participating in drug court.    Language Spoken: ENG    Religion/ Spiritual Beliefs: Not assessed today.    Financial Resources: Working FT minimum wage @ [employer], plus working [employer] for [employer].  She would like to get her own housing.  Medi-Cal w/ Jacqueline Fernandez. HS graduate. Public transportation.    Plan:   - Transition case management to [facility]; introduced today  - Pt identified goals: housing & reunifying w/ children  - [facility] colpo 09/25/15  - Primary care f/u 10/27/15 w/ Dr. Raquel Fernandez in [place]; labs prior to visit    Paperwork:  - Care plan 08/25/15  - SAMISS 04/21/15  - ARIES consent 04/21/15  - Consent & rights 04/21/15  - Registration form 04/21/15

## 2015-08-25 NOTE — Interdisciplinary (Signed)
Nurse Immunization Documentation Note    Diagnosis:     ICD-10-CM ICD-9-CM    1. Human immunodeficiency virus (HIV) disease B20 042 (ODEFSEY) emtricitabine-rilpivirine-tenofovir alafenamide tablet 200-25-25 mg      HIV-1 RNA U.QUANT/PLASMA   2. Influenza vaccine needed Z23 V04.81 INFLUENZA VAC 4 VALENT PRSRV FREE 3 YRS PLUS IM      CANCELED: INFLUENZA VAC 4 VALENT PRSRV FREE 3 YRS PLUS IM       Jacqueline Fernandez is a 34 year old year old female, DOB [birth year], allergic to Review of patient's allergies indicates no known allergies..  The patient was identified by name and date of birth. Allergies were verified.  The patient was educated about possible medication side effects and the procedure was explained to the patient.       Patient Education    Jacqueline Fernandez is a 34 year old female whose chief complaint is HIV    Vitals:    08/25/15 1448   BP: 110/56   Pulse: 75   Resp: 20   Temp: 98.3 F (36.8 C)   TempSrc: Oral   Weight: 58.5 kg (129 lb)   Height:  (1.575 m)     Pain Score 0  Body surface area is 1.6 meters squared.  Body mass index is 23.59 kg/(m^2).    Patient Education  Identified learning needs: Medications, Uses, Dose, Side Effects  Learner: Patient  Barriers to learning: No Barriers  Readiness to learn: Acceptance  Method: Explanation and Handout  Treatment education given: No  Fall prevention education given: No  Pain education given: No  Response: Verbalizes understanding

## 2015-08-25 NOTE — Patient Instructions (Addendum)
Flu shot today    Labs prior to next appointment    Take Genvoya at the SAME TIME every day - set your alarm    Come back in 2 months    [facility] phone number  [place]: [phone]  [place]: [phone]  [place]: [phone]  [place]: [phone]
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
## 2015-08-26 ENCOUNTER — Encounter: Payer: Self-pay | Admitting: Obstetrics and Gynecology

## 2015-08-26 NOTE — Interdisciplinary (Signed)
Recd reply from obstetrician, Dr. Tyrone Nine, ofc re HIV lab result.  "Last seen 01/20/09. Records in storage."

## 2015-09-16 ENCOUNTER — Encounter: Payer: Self-pay | Admitting: Obstetrics and Gynecology

## 2015-09-16 ENCOUNTER — Ambulatory Visit (INDEPENDENT_AMBULATORY_CARE_PROVIDER_SITE_OTHER): Payer: Medicaid Other | Admitting: Obstetrics and Gynecology

## 2015-09-16 VITALS — BP 118/77 | HR 79 | Ht 65.6 in | Wt 147.4 lb

## 2015-09-16 DIAGNOSIS — Z01419 Encounter for gynecological examination (general) (routine) without abnormal findings: Secondary | ICD-10-CM | POA: Diagnosis not present

## 2015-09-16 DIAGNOSIS — Z113 Encounter for screening for infections with a predominantly sexual mode of transmission: Secondary | ICD-10-CM | POA: Diagnosis not present

## 2015-09-16 DIAGNOSIS — N644 Mastodynia: Secondary | ICD-10-CM

## 2015-09-16 LAB — POCT URINE PREGNANCY: PREG TEST UR: NEGATIVE

## 2015-09-16 MED ORDER — METRONIDAZOLE 500 MG PO TABS: 500.0000 mg | ORAL_TABLET | Freq: Two times a day (BID) | ORAL | Status: DC

## 2015-09-16 MED ORDER — FLUCONAZOLE 150 MG PO TABS: 150.0000 mg | ORAL_TABLET | Freq: Once | ORAL | Status: DC

## 2015-09-16 NOTE — Progress Notes (Signed)
GYNECOLOGY WELL WOMAN EXAM  Subjective:    Christine Rice is a 34 y.o. G58P2012 female who presents to establish care, and for an annual exam. The patient has complaints of a vaginal discharge and irregular menses today. The patient is sexually active. GYN screening history: last pap: patient does not recall when last pap was (approximately 2 or 3 years ago), reports normal results. The patient wears seatbelts: yes. The patient participates in regular exercise: no. Has the patient ever been transfused or tattooed?: tatoo. The patient reports that there is not domestic violence in her life.   Menstrual History: OB History    Gravida Para Term Preterm AB TAB SAB Ectopic Multiple Living   3 2 2  1 1    3       Menarche age: 84  No LMP recorded. Patient with Mirena IUD in place for ~ 3 years, however notes irregular spotting, light bleeding over past 2 months.     Past Medical History  Diagnosis Date  . Anxiety     Past Surgical History  Procedure Laterality Date  . Cesarean section  01/06/05  . Cesarean section  01/19/2012    Procedure: CESAREAN SECTION;  Surgeon: Luz Lex, MD;  Location: [facility];  Service: Gynecology;  Laterality: N/A;  repeat  . Inguinal hernia repair  01/19/2012    Procedure: HERNIA REPAIR INGUINAL ADULT BILATERAL;  Surgeon: Adin Hector, MD;  Location: [facility];  Service: General;  Laterality: Bilateral;  . Umbilical hernia repair  01/19/2012    Procedure: HERNIA REPAIR UMBILICAL ADULT;  Surgeon: Adin Hector, MD;  Location: [facility];  Service: General;  Laterality: N/A;  . Hernia repair    . Umbilical hernia repair  11-19-14    History reviewed. No pertinent family history.  Social History   Social History  . Marital Status: Divorced    Spouse Name: N/A  . Number of Children: 2  . Years of Education: N/A   Occupational History  . 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 Smoking status: Current Every Day  Smoker -- 0.10 packs/day    Types: Cigarettes  . Smokeless tobacco: Never Used  . Alcohol Use: 0.0 oz/week    0 Standard drinks or equivalent per week     Comment: occ/socially  . Drug Use: Yes    Special: Marijuana     Comment: occ- marijuana   . Sexual Activity:    Partners: Male   Other Topics Concern  . Not on file   Social History Narrative    Outpatient Encounter Prescriptions as of 09/16/2015  Medication Sig  . levonorgestrel (MIRENA) 20 MCG/24HR IUD 1 each by Intrauterine route once.    Allergies  Allergen Reactions  . Amoxicillin Hives    REACTION: rash     Review of Systems Constitutional: negative for chills, fatigue, fevers and sweats Eyes: negative for irritation, redness and visual disturbance Ears, nose, mouth, throat, and face: negative for hearing loss, nasal congestion, snoring and tinnitus Respiratory: negative for asthma, cough, sputum Cardiovascular: negative for chest pain, dyspnea, exertional chest pressure/discomfort, irregular heart beat, palpitations and syncope Gastrointestinal: negative for abdominal pain, change in bowel habits, nausea and vomiting Genitourinary: positive for vaginal discharge (x 1-2 weeks, intermittent, denies odor, itching, burning), and  abnormal menstrual periods (previously amenorrheic due to Mirena IUD, now with irregular spotting/light bleeding over past several months), negative for  genital lesions, sexual problems, dysuria and  urinary incontinence Integument/breast: positive for breast tenderness;  negative for breast lump, and nipple discharge Hematologic/lymphatic: negative for bleeding and easy bruising Musculoskeletal:negative for back pain and muscle weakness Neurological: negative for dizziness, headaches, vertigo and weakness Endocrine: negative for diabetic symptoms including polydipsia, polyuria and skin dryness Allergic/Immunologic: negative for hay fever and urticaria    Objective:    BP 118/77 mmHg   Pulse 79  Ht 5' 5.6" (1.666 m)  Wt 147 lb 6 oz (66.849 kg)  BMI 24.08 kg/m2.    General Appearance:    Alert, cooperative, no distress, appears stated age  Head:    Normocephalic, without obvious abnormality, atraumatic  Eyes:    PERRL, conjunctiva/corneas clear, EOM's intact, both eyes  Ears:    Normal external ear canals, both ears  Nose:   Nares normal, septum midline, mucosa normal, no drainage or sinus tenderness  Throat:   Lips, mucosa, and tongue normal; teeth and gums normal  Neck:   Supple, symmetrical, trachea midline, no adenopathy; thyroid: no enlargement/tenderness/nodules; no carotid bruit or JVD  Back:     Symmetric, no curvature, ROM normal, no CVA tenderness  Lungs:     Clear to auscultation bilaterally, respirations unlabored  Chest Wall:    No tenderness or deformity   Heart:    Regular rate and rhythm, S1 and S2 normal, no murmur, rub or gallop  Breast Exam:    No tenderness, masses, or nipple abnormality  Abdomen:     Soft, non-tender, bowel sounds active all four quadrants, no masses, no organomegaly.    Genitalia:    Pelvic:external genitalia normal, vagina without lesions, or tenderness.  Vaginal discharge present, white, thin, moderate amount, no odor.  rectovaginal septum  normal. Cervix normal in appearance, no cervical motion tenderness, IUD strings visible, ~ 3 cm from cervical os.  No adnexal masses or tenderness.  Uterus normal size, consistency, mobile.   Rectal:    Normal external sphincter.  No hemorrhoids appreciated. Internal exam not done.   Extremities:   Extremities normal, atraumatic, no cyanosis or edema  Pulses:   2+ and symmetric all extremities  Skin:   Skin color, texture, turgor normal, no rashes or lesions  Lymph nodes:   Cervical, supraclavicular, and axillary nodes normal  Neurologic:   CNII-XII intact, normal strength, sensation and reflexes throughout     Microscopic wet-mount exam shows KOH done, clue cells, negative for tichomonads and  yeast.   Assessment:    Healthy female exam.   Breast tenderness with irregular uterine bleeding/spotting Bacterial vaginosis   Plan:    Chlamydia specimen/GC specimen.  Declines STI serology.  Discussed healthy lifestyle modifications. Pap smear. Pregnancy test, result: negative. Contraception: Mirena IUD.  To continue use until time for removal after 5 years. Given reassurance regarding spotting/bleeding, should resolve with time.  Wet prep performed with findings of bacterial vaginosis.  Will prescribe Flagyl BID x 7 days.  Also prescribed dose of Diflucan to take after antibiotic use if yeast infection develops.    RTC in 1 year, or as needed.    Rubie Maid, MD [facility]

## 2015-09-19 ENCOUNTER — Encounter: Payer: Self-pay | Admitting: Obstetrics and Gynecology

## 2015-09-19 LAB — GC/CHLAMYDIA PROBE AMP
Chlamydia trachomatis, NAA: NEGATIVE
Neisseria gonorrhoeae by PCR: NEGATIVE

## 2015-09-22 ENCOUNTER — Telehealth: Payer: Self-pay

## 2015-09-22 LAB — PAP IG AND HPV HIGH-RISK: PAP Smear Comment: 0

## 2015-09-22 NOTE — Telephone Encounter (Signed)
Left message to contact office. (pap and std) results negative.

## 2015-09-22 NOTE — Telephone Encounter (Signed)
Patient notified and voiced understanding.

## 2015-09-24 NOTE — Telephone Encounter (Signed)
error 

## 2015-09-25 ENCOUNTER — Ambulatory Visit (HOSPITAL_BASED_OUTPATIENT_CLINIC_OR_DEPARTMENT_OTHER): Payer: Medicaid Other | Admitting: Obstetrics & Gynecology

## 2015-10-27 ENCOUNTER — Ambulatory Visit: Payer: Medicaid Other | Attending: Infectious Diseases | Admitting: Infectious Diseases

## 2015-10-27 VITALS — BP 115/63 | HR 63 | Temp 98.5°F | Resp 20 | Ht 62.0 in | Wt 145.0 lb

## 2015-10-27 DIAGNOSIS — R87612 Low grade squamous intraepithelial lesion on cytologic smear of cervix (LGSIL): Principal | ICD-10-CM | POA: Insufficient documentation

## 2015-10-27 DIAGNOSIS — B20 Human immunodeficiency virus [HIV] disease: Secondary | ICD-10-CM | POA: Insufficient documentation

## 2015-10-27 NOTE — Progress Notes (Signed)
[place] VISIT    Patient ID:  Jacqueline Fernandez is a 34 year old woman with HIV (CD4 251, VL 1K on 12/30/14) diagnosed on in jail 10/07/14. Started Stribild 12/30/14 but caused nausea, switched to Complera March 2016, then [facility] 04/2015.     CC: Follow-up HIV    HPI: Jacqueline Fernandez every day consistently.  Now 34 days sober after having some beer while working at [employer]. Now working 8h/day at [employer] in [place] - now on day shift.     Will be able to visit children for Thanksgiving, then they are coming to live with her in January (6,7, and8 yo).    She is in a stable relationship - lives with him and his mother.   Has not followed through with [facility] for colpo.      PMH:   HIV  LGSIL cervix  History of Polysubstance abuse    PSH:  Tubal ligation 01/25/2009    Review of Systems:  Constitutional: denies fever, chills, night sweats, unintentional weight loss  Skin: denies rash, lesions, or ulcer  HEENT: denies teary/ runny eyes, runny nose, HA, sinus pain, vision changes, sore throat, nasal congestion, difficulty to swallow, or pain with swallowing  Pulmonary: denies cough, wheezing, SOB  Cardiac: denies CP, palpitation  Gastro-Intestinal/Biliary/Hepatic: denies abdominal pain, nausea/ vomiting, constipation  GU: No vaginal dyscharge, dysuria  Neurological: denies numbness/ tingling, pain with ambulation  Musculoskeletal: denies leg or joint swelling, pain      Current Outpatient Prescriptions on File Prior to Visit   Medication Sig Dispense Refill    (ODEFSEY) emtricitabine-rilpivirine-tenofovir alafenamide tablet 200-25-25 mg Take 1 tablet by mouth daily (with food). 30 tablet 6     No current facility-administered medications on file prior to visit.        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 Released from jail 01/2015 and completed rehabilitation program.   STarted using meth at age 9. Used until 21, then started shooting it.   Thinks got  HIV from sharing needles.  Used to use meth but more recently was drinking whiskey a lot on an empty stomach.   Was drunk and arrested for drunk in public. Spit on a sargeant - got a county 34 and 4 felonies.  Found out she had HIV.    Has a 34yo girl , 34yo and 34yo boys who stay with her mom.  Mother has grandparents rights (she may have custody because patient is out of state).  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 Also has boyfriend     From here, moved out to [place] with her mom at age 18 ([place]). Lived there for 9 years, then came back here to escape friends.        No Known Allergies    Physical Exam:  BP 115/63  Pulse 63  Temp 98.5 F (36.9 C) (Oral)  Resp 20  Ht _0  (1.575 m)  Wt 65.8 kg (145 lb)  BMI 26.52 kg/m2  Body mass index is 26.52 kg/(m^2).  GEN: well developed, well nourished, NAD  HEENT: Normocephalic, atraumatic,  EOMI, oropharynx clear without thrush or oral hairy leukoplakia  BJE: no edema/clubbing  NEURO: A&Ox3, grossly wnl  Psych: linear thought process, appropriate  SKIN:  no rashes    Studies  Lab Results   Component Value Date    THELPERABS 468 04/21/2015    THELPERABS 251 12/30/2014     No components found for: THELPER  Lab Results   Component Value Date    HIV1RNAULTRA Not Detected 06/23/2015     No results found for: HLAB5701  No results found for: GFRAA  Lab Results   Component Value Date    WBC 4.9 04/21/2015    RBC 4.28 04/21/2015    HGB 12.9 04/21/2015    HCT 37.8 04/21/2015    MCV 88.3 04/21/2015    MCHC 34.1 04/21/2015    RDW 12.3 04/21/2015    PLT 190 04/21/2015    MPV 11.0 04/21/2015    LYMPHS 24 04/21/2015    MONOS 8 04/21/2015    EOS 2 04/21/2015     No results found for: INR  Lab Results   Component Value Date    HIV1RNAULTRA Not Detected 06/23/2015     Lab Results   Component Value Date    BUN 11 04/21/2015     CREAT 0.83 04/21/2015    CL 104 04/21/2015    NA 142 04/21/2015    K 4.2 04/21/2015    [place] 9.1 04/21/2015    TBILI 0.79 04/21/2015    ALB 4.5 04/21/2015    TP 7.3 04/21/2015    AST 16 04/21/2015    ALK 42 04/21/2015    BICARB 22 04/21/2015    ALT 13 04/21/2015    GLU 88 04/21/2015     Lab Results   Component Value Date    AST 16 04/21/2015    ALT 13 04/21/2015    ALK 42 04/21/2015    TP 7.3 04/21/2015    ALB 4.5 04/21/2015    TBILI 0.79 04/21/2015    DBILI <0.2 04/21/2015     Lab Results   Component Value Date    CHOL 165 12/16/2014    HDL 59 12/16/2014    LDLCALC 78 12/16/2014    TRIG 140 12/16/2014     Lab Results   Component Value Date    COLORUA Straw 12/16/2014    APPEARUA Clear 12/16/2014    GLUCOSEUA Negative 12/16/2014    BILIUA Negative 12/16/2014    KETONEUA Negative 12/16/2014    SGUA 1.013 12/16/2014    BLOODUA Negative 12/16/2014    PHUA 7.0 12/16/2014    PROTEINUA Negative 12/16/2014    UROBILUA Negative 12/16/2014    NITRITEUA Negative 12/16/2014    LEUKESTUA Negative 12/16/2014    WBCUA 0-2 12/16/2014    RBCUA 0-2 12/16/2014     Lab Results   Component Value Date    THELPERABS 468 04/21/2015    HIV1RNAULTRA Not Detected 06/23/2015     Path:  -02/2015 LGSIL on pap smear      ASSESSMENT AND PLAN  Jacqueline Fernandez is a 34 year old year old woman with HIV here for f/u.    1. HIV: Diagnosed 09/2014, started on Stribild 12/30/14 and switched to Complera due to nausea. CD4 251/29% and VL 1756 prior to starting meds. Now UD on [place]  - labs next appt    # History of polysubstance abuse: Sober after incarceration 2015, drank 2016, now sober since 06/2015  - encouraged    #LGSIL:  Needs to schedule colpo.  Rereferred. Encouraged to do prior to children are back    #Social:  Working with CM.Marland Kitchen Monogamous. Working     #  Health maintenance:  Flu shot: 08/25/15  Tdap: 12/30/14   Prevnar: 12/16/14  Quantiferon: negative 12/30/14   Mammogram: not indicated  Colonoscopy: not indicated  Pap smear - cervical:  LGSIL - referred for colpo  Pap smear - anal: deferred  STD screening - 12/16/14 GC/Chlamydia urine and syphilis EIA negative; BV screen and GC/Chlamydia today  Hepatitis A:  immunized  Hepatitis B: immunized  Hepatitis C: negative  Dental cleaning: seeing dentist    Follow-up 3 months

## 2015-12-17 ENCOUNTER — Encounter: Payer: Self-pay | Admitting: Obstetrics and Gynecology

## 2015-12-17 ENCOUNTER — Ambulatory Visit (INDEPENDENT_AMBULATORY_CARE_PROVIDER_SITE_OTHER): Payer: Medicaid Other | Admitting: Obstetrics and Gynecology

## 2015-12-17 VITALS — BP 110/67 | HR 86 | Ht 66.0 in | Wt 153.3 lb

## 2015-12-17 DIAGNOSIS — A499 Bacterial infection, unspecified: Secondary | ICD-10-CM

## 2015-12-17 DIAGNOSIS — N760 Acute vaginitis: Secondary | ICD-10-CM

## 2015-12-17 DIAGNOSIS — B9689 Other specified bacterial agents as the cause of diseases classified elsewhere: Secondary | ICD-10-CM

## 2015-12-17 MED ORDER — METRONIDAZOLE 500 MG PO TABS: 500.0000 mg | ORAL_TABLET | Freq: Two times a day (BID) | ORAL | Status: DC

## 2015-12-17 NOTE — Progress Notes (Signed)
    GYNECOLOGY PROGRESS NOTE  Subjective:    Patient ID: Christine Rice, female    DOB: Aug 30, 1981, 35 y.o.   MRN: [id number]  HPI  Patient is a 35 y.o. G68P2012 female who presents for vaginal odor after sexual activity.  This has been ongoing for several weeks.  Denies vaginal discharge, urinary discomfort.  Notes h/o episodes of vaginitis in the past. Denies recent changes in hygiene practices or sexual partner/practices.   The following portions of the patient's history were reviewed and updated as appropriate: allergies, current medications, past family history, past medical history, past social history, past surgical history and problem list.  Review of Systems Pertinent items noted in HPI and remainder of comprehensive ROS otherwise negative.   Objective:   Blood pressure 110/67, pulse 86, height 5\' 6"  (1.676 m), weight 153 lb 4.8 oz (69.536 kg). General appearance: alert and no distress Abdomen: soft, non-tender; bowel sounds normal; no masses,  no organomegaly Pelvic: cervix normal in appearance, external genitalia normal, no adnexal masses or tenderness, no cervical motion tenderness, positive findings: positive whiff test or saline prep positive for clue cells, rectovaginal septum normal and uterus normal size, shape, and consistency Extremities: extremities normal, atraumatic, no cyanosis or edema Neurologic: Grossly normal   Assessment:   Bacterial vaginosis H/o recurrent vaginitis  Plan:   Discussion had with patient regarding diagnosis.  Will prescribe course of Flagyl.  Due to patient's history of recurrent episodes, will follow current treatment with long-term prophylaxis x 6 months. Also encouraged patient to take a probiotic.  She is currently using mild soaps and detergents, voiding and washing prior to and immediately after intercourse.    Rubie Maid, MD [facility]

## 2015-12-22 ENCOUNTER — Ambulatory Visit (HOSPITAL_BASED_OUTPATIENT_CLINIC_OR_DEPARTMENT_OTHER): Payer: Medicaid Other | Admitting: Infectious Diseases

## 2015-12-25 ENCOUNTER — Other Ambulatory Visit: Payer: Self-pay

## 2015-12-25 MED ORDER — ALPRAZOLAM 0.25 MG PO TABS: ORAL_TABLET | ORAL | Status: DC

## 2015-12-25 NOTE — Telephone Encounter (Signed)
Pt left v/m requesting refill xanax to [place]. Pt only uses prn. Pt is out of med. Last refilled # 30 on 04/23/14. Last seen 07/14/14. Please advise. No future appt scheduled.

## 2015-12-25 NOTE — Telephone Encounter (Signed)
Px written for call in   Thanks  

## 2015-12-25 NOTE — Telephone Encounter (Signed)
Rx called in to requested pharmacy 

## 2016-01-19 ENCOUNTER — Ambulatory Visit: Payer: Medicaid Other | Attending: Infectious Diseases | Admitting: Infectious Diseases

## 2016-01-19 ENCOUNTER — Telehealth (HOSPITAL_BASED_OUTPATIENT_CLINIC_OR_DEPARTMENT_OTHER): Payer: Self-pay | Admitting: Infectious Diseases

## 2016-01-19 ENCOUNTER — Other Ambulatory Visit: Payer: Medicaid Other | Attending: Infectious Diseases

## 2016-01-19 VITALS — BP 111/58 | HR 69 | Temp 98.8°F | Resp 20 | Ht 62.0 in | Wt 145.0 lb

## 2016-01-19 DIAGNOSIS — R87612 Low grade squamous intraepithelial lesion on cytologic smear of cervix (LGSIL): Principal | ICD-10-CM | POA: Insufficient documentation

## 2016-01-19 DIAGNOSIS — E559 Vitamin D deficiency, unspecified: Secondary | ICD-10-CM

## 2016-01-19 DIAGNOSIS — B20 Human immunodeficiency virus [HIV] disease: Principal | ICD-10-CM | POA: Insufficient documentation

## 2016-01-19 LAB — COMPREHENSIVE METABOLIC PANEL, BLOOD
ALT (SGPT): 12 U/L (ref 0–33)
AST (SGOT): 13 U/L (ref 0–32)
Albumin: 4.5 g/dL (ref 3.5–5.2)
Alkaline Phos: 39 U/L (ref 35–140)
Anion Gap: 16 mmol/L — ABNORMAL HIGH (ref 7–15)
BUN: 12 mg/dL (ref 6–20)
Bicarbonate: 23 mmol/L (ref 22–29)
Bilirubin, Tot: 0.89 mg/dL (ref <1.20)
Calcium: 9 mg/dL (ref 8.5–10.6)
Chloride: 102 mmol/L (ref 98–107)
Creatinine: 0.79 mg/dL (ref 0.51–0.95)
GFR: >60 mL/min
Glucose: 91 mg/dL (ref 70–99)
Potassium: 3.6 mmol/L (ref 3.5–5.1)
Sodium: 141 mmol/L (ref 136–145)
Total Protein: 6.9 g/dL (ref 6.0–8.0)

## 2016-01-19 LAB — CBC WITH DIFF, BLOOD
ANC-Automated: 3.3 10*3/uL (ref 1.6–7.0)
Abs Eosinophils: 0.2 10*3/uL (ref 0.1–0.5)
Abs Lymphs: 1.3 10*3/uL (ref 0.8–3.1)
Abs Monos: 0.4 10*3/uL (ref 0.2–0.8)
Eosinophils: 4 %
Hct: 39.6 % (ref 34.0–45.0)
Hgb: 13.5 gm/dL (ref 11.2–15.7)
Lymphocytes: 24 %
MCH: 30.6 pg (ref 26.0–32.0)
MCHC: 34.1 % (ref 32.0–36.0)
MCV: 89.8 um3 (ref 79.0–95.0)
MPV: 11.6 fL (ref 9.4–12.4)
Monocytes: 8 %
Plt Count: 165 10*3/uL (ref 140–370)
RBC: 4.41 10*6/uL (ref 3.90–5.20)
RDW: 12.1 % (ref 12.0–14.0)
Segs: 64 %
WBC: 5.2 10*3/uL (ref 4.0–10.0)

## 2016-01-19 MED ORDER — EMTRICITAB-RILPIVIR-TENOFOV AF 200-25-25 MG PO TABS
1.0000 | ORAL_TABLET | Freq: Every day | ORAL | 9 refills | Status: DC
Start: 2016-01-19 — End: 2016-11-10

## 2016-01-19 NOTE — Telephone Encounter (Signed)
Pharmacy requesting a diagnosis code for patient, per pharmacy due to patients insurance this is need it for RX (ODEFSEY) emtricitabine-rilpivirine-tenofovir alafenamide tablet 200-25-25 mg

## 2016-01-19 NOTE — Telephone Encounter (Signed)
Informed Trey Paula from [facility].

## 2016-01-19 NOTE — Progress Notes (Signed)
[name] VISIT    Patient ID:  Jacqueline Fernandez is a 34 year old woman with HIV (CD4 251, VL 1K on 12/30/14) diagnosed on in jail 10/07/14. Started Stribild 12/30/14 but caused nausea, switched to Complera March 2016, then Odefsey 04/2015.     CC: Follow-up HIV    HPI: Takes Odefsy every day consistently (missed one day so took Complera).  Children now with her - all in school (9,8, and 7).  They've moved in with her boyfriend - monogamous.  She is working day shifts at [employer].  She is interested in HOPWA and wants to talk with CM about this.    Has not followed through with [facility] for colpo - referral still active.    Sober > 6 months now.    Feeling overall well. No specific complaints      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)  History of Polysubstance abuse    PSH:  Tubal ligation 01/25/2009    Review of Systems:  Constitutional: denies fever, chills, night sweats, unintentional weight loss  Skin: denies rash, lesions, or ulcer  HEENT: denies teary/ runny eyes, runny nose, HA, sinus pain, vision changes, sore throat, nasal congestion, difficulty to swallow, or pain with swallowing  Pulmonary: denies cough, wheezing, SOB  Cardiac: denies CP, palpitation  Gastro-Intestinal/Biliary/Hepatic: denies abdominal pain, nausea/ vomiting, constipation  GU: No vaginal dyscharge, dysuria  Neurological: denies numbness/ tingling, pain with ambulation  Musculoskeletal: denies leg or joint swelling, pain      Current Outpatient Prescriptions on File Prior to Visit   Medication Sig Dispense Refill   . (ODEFSEY) emtricitabine-rilpivirine-tenofovir alafenamide tablet 200-25-25 mg Take 1 tablet by mouth daily (with food). 30 tablet 6     No current facility-administered medications on file prior to visit.        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 Released from jail 01/2015 and completed rehabilitation program.   STarted using meth at age 16. Used until 21,  then started shooting it.   Thinks got HIV from sharing needles.  Used to use meth but more recently was drinking whiskey a lot on an empty stomach.   Was drunk and arrested for drunk in public. 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  Found out she had HIV.    Has a 35yo girl , 35yo and 35yo boys who now live with her.    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 Also has boyfriend     From here, moved out to [place] with her mom at age 21 ([place]). Lived there for 9 years, then came back here to escape friends.        No Known Allergies    Physical Exam:  BP 111/58  Pulse 69  Temp 98.8 F (37.1 C) (Oral)  Resp 20  Ht 5' 2" (1.575 m)  Wt 65.8 kg (145 lb)  BMI 26.52 kg/m2  Body mass index is 26.52 kg/(m^2).  GEN: well developed, well nourished, NAD  HEENT: Normocephalic, atraumatic,  EOMI, oropharynx clear without thrush or oral hairy leukoplakia  CHEST: CTAB, breathing comfortably on RA  BJE: no edema/clubbing  NEURO: A&Ox3, grossly wnl  Psych: linear thought process, appropriate    SKIN: no rashes    Studies  Lab Results   Component Value Date    THELPERABS 468 04/21/2015    THELPERABS 251 12/30/2014     No components found for: THELPER  Lab Results   Component Value Date    HIV1RNAULTRA Not Detected 06/23/2015     No results found for: HLAB5701  No results found for: GFRAA  Lab Results   Component Value Date    WBC 4.9 04/21/2015    RBC 4.28 04/21/2015    HGB 12.9 04/21/2015    HCT 37.8 04/21/2015    MCV 88.3 04/21/2015    MCHC 34.1 04/21/2015    RDW 12.3 04/21/2015    PLT 190 04/21/2015    MPV 11.0 04/21/2015    LYMPHS 24 04/21/2015    MONOS 8 04/21/2015    EOS 2 04/21/2015     No results found for: INR  Lab Results   Component Value Date    HIV1RNAULTRA Not Detected 06/23/2015     Lab Results   Component Value Date    BUN 11 04/21/2015     CREAT 0.83 04/21/2015    CL 104 04/21/2015    NA 142 04/21/2015    K 4.2 04/21/2015    CA 9.1 04/21/2015    TBILI 0.79 04/21/2015    ALB 4.5 04/21/2015    TP 7.3 04/21/2015    AST 16 04/21/2015    ALK 42 04/21/2015    BICARB 22 04/21/2015    ALT 13 04/21/2015    GLU 88 04/21/2015     Lab Results   Component Value Date    AST 16 04/21/2015    ALT 13 04/21/2015    ALK 42 04/21/2015    TP 7.3 04/21/2015    ALB 4.5 04/21/2015    TBILI 0.79 04/21/2015    DBILI <0.2 04/21/2015     Lab Results   Component Value Date    CHOL 165 12/16/2014    HDL 59 12/16/2014    LDLCALC 78 12/16/2014    TRIG 140 12/16/2014     Lab Results   Component Value Date    COLORUA Straw 12/16/2014    APPEARUA Clear 12/16/2014    GLUCOSEUA Negative 12/16/2014    BILIUA Negative 12/16/2014    KETONEUA Negative 12/16/2014    SGUA 1.013 12/16/2014    BLOODUA Negative 12/16/2014    PHUA 7.0 12/16/2014    PROTEINUA Negative 12/16/2014    UROBILUA Negative 12/16/2014    NITRITEUA Negative 12/16/2014    LEUKESTUA Negative 12/16/2014    WBCUA 0-2 12/16/2014    RBCUA 0-2 12/16/2014     Lab Results   Component Value Date    THELPERABS 468 04/21/2015    HIV1RNAULTRA Not Detected 06/23/2015     Path:  -02/2015 LGSIL on pap smear      ASSESSMENT AND PLAN  Jacqueline Fernandez is a 34 year old year old woman with HIV here for f/u.    1. HIV: Diagnosed 09/2014, started on Stribild 12/30/14 and switched to Complera due to nausea. CD4 251/29% and VL 1756 prior to starting meds. Now UD on Odefsey  - cont Odefsey. Refill done  - labs today    # History of polysubstance abuse: Sober after incarceration 2015, drank 2016, now sober since 06/2015  - encouraged    #LGSIL:  Needs to schedule colpo.  Rereferred. Encouraged to do prior to children are back  - provided phone numbers    #Social:    Working with CM.. Monogamous. Working. Will check with CM re HOPWA     S/p tubal ligation     # Health maintenance:  Flu shot: 08/25/15  Tdap: 12/30/14   Prevnar: 12/16/14  Quantiferon: negative  12/30/14   Mammogram: not indicated  Colonoscopy: not indicated  Pap smear - cervical: LGSIL - referred for colpo  Pap smear - anal: deferred  STD screening - 12/16/14 GC/Chlamydia urine and syphilis EIA negative; BV screen and GC/Chlamydia neg  Hepatitis A:  immunized  Hepatitis B: immunized  Hepatitis C: negative  Dental cleaning: seeing dentist    Follow-up 6 months

## 2016-01-19 NOTE — Patient Instructions (Signed)
For the colposcopy:  [facility] phone number  [place]: [phone]  [place]: [phone]    Come back in 6 months or sooner as needed

## 2016-01-20 LAB — T CELL SUBSETS, BLOOD
CD4+ T-Cell %: 41 % (ref 29–61)
CD4+ T-Cell Abs: 515 cells/uL (ref 250–1200)
CD4:CD8 Ratio: 1.02 (ref 0.70–4.00)
CD8+ T-Cell %: 40 % — ABNORMAL HIGH (ref 11–38)
CD8+ T-Cell Abs: 504 cells/uL (ref 100–800)

## 2016-01-21 ENCOUNTER — Telehealth (HOSPITAL_BASED_OUTPATIENT_CLINIC_OR_DEPARTMENT_OTHER): Payer: Self-pay | Admitting: Infectious Diseases

## 2016-01-21 LAB — HIV-1 RNA QUANT/PLASMA: HIV-1 RNA Ultra Quant, Plasma: NOT DETECTED copy/mL

## 2016-01-21 LAB — VITAMIN D, 25-OH TOTAL
Vitamin D, 25-OH D2: <5 ng/mL
Vitamin D, 25-OH D3: 25 ng/mL
Vitamin D, 25-OH TOTAL: 25 ng/mL — ABNORMAL LOW (ref 30–80)

## 2016-01-21 MED ORDER — VITAMIN D (CHOLECALCIFEROL) 400 UNITS PO TABS
400.0000 [IU] | ORAL_TABLET | Freq: Every day | ORAL | 6 refills | Status: DC
Start: 2016-01-21 — End: 2016-11-10

## 2016-01-21 NOTE — Telephone Encounter (Signed)
Called pt to verify home address.  Pt changed address to      [address]   [address]   [place] [address]       Mailed results to the following address.  Pt agreed.

## 2016-01-21 NOTE — Telephone Encounter (Signed)
Patient request results of recent blood test     She currently does not have access to the [facility]

## 2016-01-28 ENCOUNTER — Telehealth: Payer: Self-pay | Admitting: Family Medicine

## 2016-01-28 NOTE — Telephone Encounter (Signed)
Pt hasn't been seen since 07/14/14, and no future appt., please advise

## 2016-01-28 NOTE — Telephone Encounter (Signed)
Please schedule f/u with me this spring Px written for call in

## 2016-01-29 NOTE — Telephone Encounter (Signed)
Pt said she that is having a hard time finically and that she was on medicaid but we don't accept medicaid so that's why she hasn't been seen, but her medicaid will run out at the end of the month and she will have no insurance starting in March. Pt said she is trying to make sure she doesn't run out of medication until she can get insurance again. Pt said it will probably be next year before she is able to afford insurance. Pt declined to schedule an appt but wanted me to check with Dr. Glori Bickers to see if she would still refill her medication  Rx not called into the pharmacy until I check with Dr. Glori Bickers

## 2016-02-02 ENCOUNTER — Other Ambulatory Visit: Payer: Self-pay | Admitting: Family Medicine

## 2016-02-02 NOTE — Telephone Encounter (Signed)
See previous messages.  Okay to still refill if patient not able to make appointment?

## 2016-02-02 NOTE — Telephone Encounter (Signed)
[place] left v/m requesting cb about xanax refill.

## 2016-02-02 NOTE — Telephone Encounter (Signed)
Ok to refill times one  I cannot refil further after that unless I see her -perhaps Vincente Liberty could discuss payment plans with her or perhaps she could get her gyn to fill it because she saw her in the fall (I saw well woman exam in the chart)  Not sure if that doctor would be comfortable with that or not

## 2016-02-05 NOTE — Telephone Encounter (Signed)
Rx called in as prescribed. Pt said her insurance she is going to qualify for is just for gyn exams so she will reach out to her ob/gyn office to see if they can fill Rx, if not I did let pt know we have an payment assistance dpt that I could give her the # to so they could help her with getting an appt here. Pt said she will check with ob/gyn 1st and if they can't she will call back for the assistance #

## 2016-02-16 ENCOUNTER — Ambulatory Visit (HOSPITAL_BASED_OUTPATIENT_CLINIC_OR_DEPARTMENT_OTHER): Payer: Medicaid Other | Admitting: Infectious Diseases

## 2016-07-05 ENCOUNTER — Other Ambulatory Visit (INDEPENDENT_AMBULATORY_CARE_PROVIDER_SITE_OTHER): Payer: BC Managed Care – PPO

## 2016-07-05 ENCOUNTER — Telehealth (INDEPENDENT_AMBULATORY_CARE_PROVIDER_SITE_OTHER): Payer: Self-pay | Admitting: Family Medicine

## 2016-07-05 DIAGNOSIS — Z0000 Encounter for general adult medical examination without abnormal findings: Secondary | ICD-10-CM

## 2016-07-05 LAB — COMPREHENSIVE METABOLIC PANEL
ALT: 13 U/L (ref 0–35)
AST: 18 U/L (ref 0–37)
Albumin: 3.2 g/dL — ABNORMAL LOW (ref 3.5–5.2)
Alkaline Phosphatase: 41 U/L (ref 39–117)
BUN: 7 mg/dL (ref 6–23)
CHLORIDE: 109 meq/L (ref 96–112)
CO2: 30 mEq/L (ref 19–32)
Calcium: 8.7 mg/dL (ref 8.4–10.5)
Creatinine, Ser: 0.72 mg/dL (ref 0.40–1.20)
GFR: 97.87 mL/min (ref >60.00)
GLUCOSE: 81 mg/dL (ref 70–99)
POTASSIUM: 4.2 meq/L (ref 3.5–5.1)
SODIUM: 138 meq/L (ref 135–145)
Total Bilirubin: 0.4 mg/dL (ref 0.2–1.2)
Total Protein: 5.3 g/dL — ABNORMAL LOW (ref 6.0–8.3)

## 2016-07-05 LAB — LIPID PANEL
CHOL/HDL RATIO: 4
Cholesterol: 160 mg/dL (ref 0–200)
HDL: 39.9 mg/dL (ref >39.00)
LDL Cholesterol: 107 mg/dL — ABNORMAL HIGH (ref 0–99)
NONHDL: 120.32
Triglycerides: 69 mg/dL (ref 0.0–149.0)
VLDL: 13.8 mg/dL (ref 0.0–40.0)

## 2016-07-05 LAB — CBC WITH DIFFERENTIAL/PLATELET
BASOS PCT: 0.5 % (ref 0.0–3.0)
Basophils Absolute: 0.1 10*3/uL (ref 0.0–0.1)
EOS PCT: 1.4 % (ref 0.0–5.0)
Eosinophils Absolute: 0.2 10*3/uL (ref 0.0–0.7)
HCT: 44.7 % (ref 36.0–46.0)
Hemoglobin: 15.3 g/dL — ABNORMAL HIGH (ref 12.0–15.0)
LYMPHS ABS: 2.4 10*3/uL (ref 0.7–4.0)
Lymphocytes Relative: 20.1 % (ref 12.0–46.0)
MCHC: 34.1 g/dL (ref 30.0–36.0)
MCV: 91.7 fl (ref 78.0–100.0)
MONO ABS: 0.5 10*3/uL (ref 0.1–1.0)
Monocytes Relative: 4.4 % (ref 3.0–12.0)
NEUTROS ABS: 8.9 10*3/uL — AB (ref 1.4–7.7)
NEUTROS PCT: 73.6 % (ref 43.0–77.0)
PLATELETS: 221 10*3/uL (ref 150.0–400.0)
RBC: 4.87 Mil/uL (ref 3.87–5.11)
RDW: 13.6 % (ref 11.5–15.5)
WBC: 12.1 10*3/uL — ABNORMAL HIGH (ref 4.0–10.5)

## 2016-07-05 LAB — TSH: TSH: 0.6 u[IU]/mL (ref 0.35–4.50)

## 2016-07-05 NOTE — Telephone Encounter (Signed)
-----   Message from Ellamae Sia sent at 07/04/2016  2:28 PM EDT ----- Regarding: Lab orders for Tuesday, 7.25.17 Patient is scheduled for CPX labs, please order future labs, Thanks , Karna Christmas

## 2016-07-11 ENCOUNTER — Ambulatory Visit (INDEPENDENT_AMBULATORY_CARE_PROVIDER_SITE_OTHER): Payer: Medicaid Other | Admitting: Primary Care

## 2016-07-11 ENCOUNTER — Encounter: Payer: Self-pay | Admitting: Primary Care

## 2016-07-11 VITALS — BP 120/70 | HR 91 | Temp 98.0°F | Ht 66.0 in | Wt 148.4 lb

## 2016-07-11 DIAGNOSIS — Z720 Tobacco use: Secondary | ICD-10-CM | POA: Diagnosis not present

## 2016-07-11 DIAGNOSIS — Z0000 Encounter for general adult medical examination without abnormal findings: Secondary | ICD-10-CM

## 2016-07-11 DIAGNOSIS — F17200 Nicotine dependence, unspecified, uncomplicated: Secondary | ICD-10-CM

## 2016-07-11 NOTE — Assessment & Plan Note (Signed)
Not ready to quit, offered assistance when ready.

## 2016-07-11 NOTE — Progress Notes (Signed)
Subjective:    Patient ID: Christine Rice, female    DOB: 06/16/1981, 35 y.o.   MRN: [id number]  HPI  Christine Rice is a 35 year old female who presents today for complete physical.  Immunizations: -Tetanus: Completed in 2015 -Influenza: Did not complete last season  Diet: She endorses a healthy diet. Breakfast: Skips, coffee Lunch: Sandwich Dinner: Meat, vegetables, potatoes, bread Snacks: Fruit Desserts: 4-5 days weekly Beverages: Coffee, energy drinks, water  Exercise: She does not currently exercise Eye exam: Completed 5+ years ago Dental exam: Completes semi-annually Pap Smear: Completes per GYN.   Review of Systems  Constitutional: Negative for unexpected weight change.  HENT: Negative for rhinorrhea.   Respiratory: Negative for cough and shortness of breath.   Cardiovascular: Negative for chest pain.  Gastrointestinal: Negative for constipation and diarrhea.  Genitourinary: Negative for difficulty urinating.       IUD  Musculoskeletal: Negative for arthralgias and myalgias.       Occasional left knee pain  Skin: Negative for rash.  Allergic/Immunologic: Positive for environmental allergies.  Neurological: Negative for dizziness, numbness and headaches.  Psychiatric/Behavioral:       Denies concerns for anxiety or depression       Past Medical History:  Diagnosis Date  . Anxiety      Social History   Social History  . Marital status: Divorced    Spouse name: N/A  . Number of children: 2  . Years of education: N/A   Occupational History  . 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 Smoking status: Current Every Day Smoker    Packs/day: 0.10    Types: Cigarettes  . Smokeless tobacco: Never Used  . Alcohol use 0.0 oz/week     Comment: occ/socially  . Drug use:     Types: Marijuana     Comment: occ- marijuana   . Sexual activity: Yes    Partners: Male   Other Topics Concern  . Not on file    Social History Narrative  . No narrative on file    Past Surgical History:  Procedure Laterality Date  . CESAREAN SECTION  01/06/05  . CESAREAN SECTION  01/19/2012   Procedure: CESAREAN SECTION;  Surgeon: Luz Lex, MD;  Location: [facility];  Service: Gynecology;  Laterality: N/A;  repeat  . HERNIA REPAIR    . INGUINAL HERNIA REPAIR  01/19/2012   Procedure: HERNIA REPAIR INGUINAL ADULT BILATERAL;  Surgeon: Adin Hector, MD;  Location: [facility];  Service: General;  Laterality: Bilateral;  . UMBILICAL HERNIA REPAIR  01/19/2012   Procedure: HERNIA REPAIR UMBILICAL ADULT;  Surgeon: Adin Hector, MD;  Location: [facility];  Service: General;  Laterality: N/A;  . UMBILICAL HERNIA REPAIR  11-19-14    No family history on file.  Allergies  Allergen Reactions  . Amoxicillin Hives    REACTION: rash    Current Outpatient Prescriptions on File Prior to Visit  Medication Sig Dispense Refill  . ALPRAZolam (XANAX) 0.25 MG tablet TAKE 1 TABLET BY MOUTH TWICE A DAY AS NEEDED FOR SLEEP OR ANXIETY 30 tablet 0  . levonorgestrel (MIRENA) 20 MCG/24HR IUD 1 each by Intrauterine route once.     No current facility-administered medications on file prior to visit.     BP 120/70 (BP Location: Right Arm, Patient Position: Sitting, Cuff Size: Normal)   Pulse 91   Temp 98 F (36.7 C) (Oral)   Ht 5'  6" (1.676 m)   Wt 148 lb 6.4 oz (67.3 kg)   SpO2 98%   BMI 23.95 kg/m    Objective:   Physical Exam  Constitutional: She is oriented to person, place, and time. She appears well-nourished.  HENT:  Right Ear: Tympanic membrane and ear canal normal.  Left Ear: Tympanic membrane and ear canal normal.  Nose: Nose normal.  Mouth/Throat: Oropharynx is clear and moist.  Eyes: Conjunctivae and EOM are normal. Pupils are equal, round, and reactive to light.  Neck: Neck supple. No thyromegaly present.  Cardiovascular: Normal rate and regular rhythm.   No murmur heard. Pulmonary/Chest: Effort normal and breath  sounds normal. She has no rales.  Abdominal: Soft. Bowel sounds are normal. There is no tenderness.  Musculoskeletal: Normal range of motion.  Lymphadenopathy:    She has no cervical adenopathy.  Neurological: She is alert and oriented to person, place, and time. She has normal reflexes. No cranial nerve deficit.  Skin: Skin is warm and dry. No rash noted.  Psychiatric: She has a normal mood and affect.          Assessment & Plan:

## 2016-07-11 NOTE — Progress Notes (Signed)
Pre visit review using our clinic review tool, if applicable. No additional management support is needed unless otherwise documented below in the visit note. 

## 2016-07-11 NOTE — Patient Instructions (Signed)
Your labs look great.   Work to include more vegetables in your diet.  Ensure you are consuming 64 ounces of water daily.  Start exercising. You should be getting 1 hour of moderate intensity exercise 5 days weekly.  Follow up in 1 year for repeat physical or sooner if needed.  It was a pleasure meeting you!

## 2016-07-11 NOTE — Assessment & Plan Note (Signed)
Tetanus up-to-date. Pap up-to-date, patient follows with GYN. IUD in place. Exam unremarkable. Labs unremarkable. Slight elevation in white blood cell count which seems to be baseline for her. She does not appear acutely ill and has no complaints. Discussed to increase consumption of vegetables in diet, and to start exercising. Follow-up in one year for repeat physical.

## 2016-07-26 ENCOUNTER — Encounter (INDEPENDENT_AMBULATORY_CARE_PROVIDER_SITE_OTHER): Payer: Self-pay | Admitting: Obstetrics & Gynecology

## 2016-07-26 ENCOUNTER — Other Ambulatory Visit: Payer: Medicaid Other | Attending: Obstetrics & Gynecology | Admitting: Obstetrics & Gynecology

## 2016-07-26 VITALS — BP 100/64 | HR 64 | Temp 97.3°F | Ht 62.0 in | Wt 144.0 lb

## 2016-07-26 DIAGNOSIS — N870 Mild cervical dysplasia: Secondary | ICD-10-CM

## 2016-07-26 DIAGNOSIS — Z124 Encounter for screening for malignant neoplasm of cervix: Secondary | ICD-10-CM

## 2016-07-26 DIAGNOSIS — R87613 High grade squamous intraepithelial lesion on cytologic smear of cervix (HGSIL): Secondary | ICD-10-CM

## 2016-07-26 DIAGNOSIS — N879 Dysplasia of cervix uteri, unspecified: Principal | ICD-10-CM | POA: Insufficient documentation

## 2016-07-26 NOTE — Progress Notes (Signed)
Procedure: Colposcopy    Date: July 26, 2016    [facility]  Medical Record #: [id number]   Age: 35 year old     35 year old [id number] with HIV referred for LSIL Pap 15 months ago.  Did not follow up for colpo as she was nervous.  She currently smokes 1 pack of cigarettes per day.  She is monogamous with her partner, who is HIV negative.  She uses condoms and is s/p BTL.  VL is negative.      Patient presents for colposcopy. Pap smear 15 months ago showed: LSIL. The prior pap showed normal, but patient had not presented for care in many years.   Patient's last menstrual period was 07/11/2016 (exact date).  Allergies: Review of patient's allergies indicates no known allergies.    Prior cervical/vaginal disease: None.  Prior cervical treatment: no treatment.    Procedure reviewed with patient as well as risks of cervical biopsy and endocervical curettage including pain, bleeding and infection.  Written consent obtained.  Time Out: performed at 218 PM.  Correct patient:  yes  Correct procedure:  yes  Correct side/site:  yes  Correct position:  yes  Correct equipment and/or implants:  yes     PROCEDURE:  Speculum placed in vagina and excellent visualization of cervix   acheived, cervix swabbed x 3 with acetic acid solution.    FINDINGS:  Cervix: acetowhite lesion(s) noted at 2 o'Fernandez; SCJ visualized - lesion at 2 o'Fernandez.  No    Vaginal inspection: normal without visible lesions.    Vulvar colposcopy: vulvar colposcopy not performed.    Procedure Summary: Patient tolerated procedure well.    ASSESSMENT: Thin acetowhite, possibly low grade dysplasia.    PLAN: specimens labelled and sent to Pathology.  -cervical biopsy and ECC done  -will notify patient of results  -Pap smear also collected      Vernie MurdersBonnie C Amil Moseman, MD  Signature Derived From Controlled Access Password, July 26, 2016, 2:51 PM

## 2016-07-26 NOTE — Interdisciplinary (Signed)
Pregnancy test performed at 1410. Results indicated a negative results. MA and Provider notified. Daleen BoE. Motty Borin RN

## 2016-07-29 NOTE — Procedures (Signed)
SPECIMENS SUBMITTED:  Cervical (Pap) Smear;Thin-Prep Vial Received    CLINICAL INFORMATION:   No LMP Given; Other Clinical Findings: History of abnormal pap smear,  dysplasia, cervical cancer within 5 years  FINAL CYTOLOGIC INTERPRETATION:  High-grade squamous intraepithelial lesion (HSIL)    COMMENT:  HPV Subtyping will be performed on excess Thin-Prep collection fluid from  the specimen vial.  Result will be reported in EPIC separately.  Previous cytology reviewed    SPECIMEN ADEQUACY:  Satisfactory for evaluation  The Pap smear is a screening test with an inherent low error rate. Although  a normal [negative] result is highly predictive of the absence of cervical  cancer and its precursor lesions, it does not exclude the presence of  significant disease. Results must be evaluated within the context of the  individual patient.  CONFIDENTIAL HEALTH INFORMATION: Health Care information is personal and  sensitive information. If it is being faxed to you it is done so under  appropriate authorization from the patient or under circumstances that do  not require patient authorization. You, the recipient, are obligated to  maintain it in a safe, secure and confidential manner. Re-disclosure  without additional patient consent or as permitted by law is prohibited.  Unauthorized re-disclosure or failure to maintain confidentiality could  subject you to penalties described in federal and state law.  If you have  received this report or facsimile in error, please notify the [facility]  Pathology Department immediately and destroy the received document(s).    Material reviewed and Interpreted and  Report Electronically Signed by:  Ruffin FrederickAnn M. Ponsford Tipps M.D. [phone])  Attending Surgical Pathologist  07/29/16 16:17  Electronic Signature derived from a single  controlled access password

## 2016-08-02 NOTE — Procedures (Addendum)
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  B: Endocervix, curettage       -Fragments of benign cervical tissue.  Comment:  Lesional cells show no immunoreactivity for p16.    SPECIMEN(S) SUBMITTED:  A: Cervical biopsy, 2 o'clock  B: Endocervical curettage    CLINICAL HISTORY:  Low-grade squamous intraepithelial lesion (LSIL) on Pap smear one year ago,  history of HIV.    GROSS DESCRIPTION:  A: The specimen (received in formalin, labeled "cervical biopsy, 2  o'clock") consists of a 0.2 x 0.2 x 0.1 cm irregular portion of soft tan  tissue. The specimen is entirely submitted in cassette A1.  B: The specimen (received in formalin, labeled "ECC") consists of a 3.0 x  1.5 x 0.2 cm aggregate of red-tan soft tissue and mucus. The specimen is  entirely submitted in cassette B1.  AY/mt  CONFIDENTIAL HEALTH INFORMATION: Health Care information is personal and  sensitive information. If it is being faxed to you it is done so under  appropriate authorization from the patient or under circumstances that do  not require patient authorization. You, the recipient, are obligated to  maintain it in a safe, secure and confidential manner. Re-disclosure  without additional patient consent or as permitted by law is prohibited.  Unauthorized re-disclosure or failure to maintain confidentiality could  subject you to penalties described in federal and state law.  If you have  received this report or facsimile in error, please notify the [facility]  Pathology Department immediately and destroy the received document(s).  SPECIAL STAINS/PROCEDURES:  Special stains and/or procedures were used in  the final interpretation.  All stains were performed with appropriate  positive and negative controls. The immunostain(s) reported above were  developed and their performance characteristics determined  by the [facility] Department of Pathology.  They have not been cleared or  approved by the U.S. Food and Drug Administration, although such approval  is not required for analyte-specific reagents of this type.  The FDA has  determined that such clearance is not necessary.  This laboratory is  regulated under the Clinical Laboratory Improvement Amendments of 1988  (CLIA), this laboratory has established and verified the test's relevant  performance specifications.  Data collected for the verification process  are available upon request.    Material reviewed and Interpreted and  Report Electronically Signed by:  Valaria Good M.D. [phone])  Attending Surgical Pathologist  08/02/16 13:07  Electronic Signature derived from a single  controlled access password  ADDENDUM FOR ADDITIONAL REVIEW  This case was re-reviewed given the discrepancy between the reported  cytological ([id number]: HSIL) and histologic ([id number]: LSIL; benign  ECC) findings. The cytologic findings were reviewed by Dr Ronnell Guadalajara and  Valaria Good independently reviewed the  cytologic and histologic slides  on 08/08/2016. Additional immunohistochemical studies for p16 and Ki67 were  also performed on the ECC. There are no areas of block positivity  concurrent with increased proliferation in the ECC.  The cytologic (HSIL) and histologic (LSIL; benign ECC) findings are  re-affirmed following this review. The cells in the cytologic preparation  ([id number]: HSIL) are NOT accounted for in either of the current  histologic samples.    Material reviewed and Interpreted and  Report Electronically Signed by:  Valaria Good M.D. [phone])  Attending Surgical Pathologist  08/09/16 15:40  Electronic Signature derived from a single  controlled access password

## 2016-08-04 LAB — HPV HIGH RISK DNA PROBE, FEMALE
HPV High Risk Genotype 16: NOT DETECTED
HPV High Risk Genotype 18: NOT DETECTED
HPV Other High Risk Genotypes (Not type 16 or 18): DETECTED — AB

## 2016-08-16 ENCOUNTER — Encounter (INDEPENDENT_AMBULATORY_CARE_PROVIDER_SITE_OTHER): Payer: Self-pay | Admitting: Obstetrics & Gynecology

## 2016-08-16 ENCOUNTER — Telehealth (INDEPENDENT_AMBULATORY_CARE_PROVIDER_SITE_OTHER): Payer: Self-pay | Admitting: Obstetrics & Gynecology

## 2016-08-16 DIAGNOSIS — N879 Dysplasia of cervix uteri, unspecified: Principal | ICD-10-CM

## 2016-08-16 NOTE — Telephone Encounter (Signed)
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    Called patient to discuss Pap and biopsy results.  Pap shows HSIL, biopsy shows CIN1, which are conflicting.  Patient has history of BTL, does not want more children.  She had pain and a vaso-vagal reaction when getting biopsy done in clinic.    RECOMMENDATIONS:  Called patient and explained the conflicting results.  Offered options of having LEEP performed given no plans for childbearing vs. Following lesion with colposcopy and pap smear every 6 months.  If she were to choose LEEP, would likely recommend it being done in OR given poor tolerance of office colposcopy.  -patient wished to proceed with LEEP procedure - will place referral to have it done in OR  -will need to return for preop visit for LEEP, ECC  -all questions answered    Merwyn KatosBonnie Glenden Rossell, MD

## 2016-08-25 ENCOUNTER — Telehealth (INDEPENDENT_AMBULATORY_CARE_PROVIDER_SITE_OTHER): Payer: Self-pay | Admitting: Obstetrics & Gynecology

## 2016-08-25 ENCOUNTER — Encounter (HOSPITAL_BASED_OUTPATIENT_CLINIC_OR_DEPARTMENT_OTHER): Payer: Self-pay

## 2016-08-25 ENCOUNTER — Ambulatory Visit (HOSPITAL_BASED_OUTPATIENT_CLINIC_OR_DEPARTMENT_OTHER): Payer: Medicaid Other | Admitting: Gynecologic Oncology

## 2016-08-25 NOTE — Telephone Encounter (Signed)
GYN TELEPHONE NOTE    Patient states she cannot make OR date tomorrow.  Will schedule another time.    Merwyn KatosBonnie Crouthamel, MD

## 2016-08-25 NOTE — Telephone Encounter (Signed)
GYN TELEPHONE NOTE    Had OR space open for tomorrow, 9/15.  Called patient and offered her the OR spot to have LEEP done tomorrow.  She was driving and asked me to call back in 1 hour.    [name], MD

## 2016-08-26 ENCOUNTER — Encounter (HOSPITAL_COMMUNITY): Payer: Self-pay

## 2016-08-26 ENCOUNTER — Ambulatory Visit (HOSPITAL_COMMUNITY): Admit: 2016-08-26 | Payer: Self-pay | Admitting: Obstetrics & Gynecology

## 2016-08-26 SURGERY — CONIZATION, CERVIX
Site: Cervix

## 2016-09-02 ENCOUNTER — Telehealth (HOSPITAL_BASED_OUTPATIENT_CLINIC_OR_DEPARTMENT_OTHER): Payer: Self-pay | Admitting: Obstetrics & Gynecology

## 2016-09-02 NOTE — Telephone Encounter (Signed)
Left voicemail for the patient to contact me regarding a LEEP procedure in the OR for this coming Tuesday September 26th.  Encouraged patient to contact me at [phone] ASAP!

## 2016-09-09 NOTE — Progress Notes (Signed)
Pt @ [facility] asking for PARS.  Confirmed w/ Mikle BosworthCarlos that they are still accepting applications to be put on waitlist.  Pt understood.  Completed PARS application and pt provided all necessary documentation.  Shared that she is having relationship problems with boyfriend and thinking about breaking up with him.  Also ID that children exhibiting some behavioral issues since back in her care.  Seems to understand that this is an adjustment for children.  Provided emotional support and offered mental health resources for children.  Pt agreed to think about it.    Seems to be taking ARVs as directed.  Pt relapsed on alcohol this week and feeling guilty.  Processed w/ pt.  She attends NA meeting on a weekly basis.   Currently working f/t @ [employer].

## 2016-09-29 ENCOUNTER — Ambulatory Visit: Payer: Medicaid Other | Attending: Infectious Diseases | Admitting: Infectious Diseases

## 2016-09-29 ENCOUNTER — Other Ambulatory Visit: Payer: Medicaid Other | Attending: Infectious Diseases

## 2016-09-29 ENCOUNTER — Telehealth (INDEPENDENT_AMBULATORY_CARE_PROVIDER_SITE_OTHER): Payer: Self-pay | Admitting: Obstetrics & Gynecology

## 2016-09-29 VITALS — BP 137/82 | HR 82 | Temp 98.0°F | Resp 20 | Ht 62.0 in | Wt 142.0 lb

## 2016-09-29 DIAGNOSIS — Z87898 Personal history of other specified conditions: Secondary | ICD-10-CM | POA: Insufficient documentation

## 2016-09-29 DIAGNOSIS — B20 Human immunodeficiency virus [HIV] disease: Principal | ICD-10-CM | POA: Insufficient documentation

## 2016-09-29 DIAGNOSIS — F1511 Other stimulant abuse, in remission: Secondary | ICD-10-CM

## 2016-09-29 DIAGNOSIS — Z23 Encounter for immunization: Secondary | ICD-10-CM | POA: Insufficient documentation

## 2016-09-29 LAB — CBC WITH DIFF, BLOOD
ANC-Automated: 5.3 10*3/uL (ref 1.6–7.0)
Abs Eosinophils: 0.2 10*3/uL (ref 0.1–0.5)
Abs Lymphs: 1.7 10*3/uL (ref 0.8–3.1)
Abs Monos: 0.7 10*3/uL (ref 0.2–0.8)
Eosinophils: 2 %
Hct: 43.6 % (ref 34.0–45.0)
Hgb: 15.5 gm/dL (ref 11.2–15.7)
Lymphocytes: 21 %
MCH: 32 pg (ref 26.0–32.0)
MCHC: 35.6 g/dL (ref 32.0–36.0)
MCV: 89.9 um3 (ref 79.0–95.0)
MPV: 10.9 fL (ref 9.4–12.4)
Monocytes: 9 %
Plt Count: 218 10*3/uL (ref 140–370)
RBC: 4.85 10*6/uL (ref 3.90–5.20)
RDW: 12 % (ref 12.0–14.0)
Segs: 67 %
WBC: 7.9 10*3/uL (ref 4.0–10.0)

## 2016-09-29 NOTE — Interdisciplinary (Signed)
Nurse Immunization Documentation Note    Diagnosis:     ICD-10-CM ICD-9-CM    1. Human immunodeficiency virus (HIV) disease (CMS-HCC) B20 042 HIV-1 RNA U.QUANT/PLASMA      T CELL SUBSETS, BLOOD      COMPREHENSIVE METABOLIC PANEL, BLOOD      CBC WITH ADIFF, BLOOD      VITAMIN D, 25-HYDROXY, BLOOD      CHLAMYDIA/GC PCR-URINE      SYPHILIS EIA SCREEN, BLOOD      CHLAMYDIA/GC PCR-RECTAL      QUANTIFERON-TB, BLOOD   2. History of methamphetamine abuse Z87.898 305.73    3. Need for prophylactic vaccination against hepatitis A and hepatitis B Z23 V05.3 HEP A/HEP B VACCINE, ADULT      HEP A/HEP B VACCINE, ADULT      HEP A/HEP B VACCINE, ADULT   4. Influenza vaccine needed Z23 V04.81 INFLUENZA VAC 4 VALENT PRSRV FREE 3 YRS PLUS IM       Jacqueline Fernandez is a 35 year old year old female, DOB Jun 16, 1981, allergic to Review of patient's allergies indicates no known allergies..  The patient was identified by name and date of birth. Allergies were verified.  The patient was educated about possible medication side effects and the procedure was explained to the patient.   Patient Education    Jacqueline Fernandez is a 35 year old female whose chief complaint is HIV    Vitals:    09/29/16 1116   BP: 137/82   BP Location: Left arm   BP Patient Position: Sitting   BP cuff size: Regular   Pulse: 82   Resp: 20   Temp: 98 F (36.7 C)   TempSrc: Oral   Weight: 64.4 kg (142 lb)   Height: 5\' 2"  (1.575 m)     Pain Score 0  Body surface area is 1.68 meters squared.  Body mass index is 25.97 kg/(m^2).    Patient Education  Identified learning needs: Medications, Uses, Dose, Side Effects  Learner: Patient  Barriers to learning: No Barriers  Readiness to learn: Acceptance  Method: Explanation and Handout  Treatment education given: No  Fall prevention education given: No  Pain education given: No  Response: Verbalizes understanding

## 2016-09-29 NOTE — Telephone Encounter (Signed)
Patient Call Summary:      Surgery Date:  Tuesday 10/04/2016 @ 10:45 AM at [facility] OR with Dr. Shawna OrleansMody/Crouthamel  Procedure: LEEP   Length: 60 mins / Outpatient  PreOp Visit:  Monday 10/03/2016 @ 3:30pm at [facility] with  Dr. Shawna OrleansMody/Crouthamel     Patient noted appointment dates, times, location and has my direct line at [phone] if any further questions.

## 2016-09-29 NOTE — Patient Instructions (Signed)
Labs today  Byrd HesselbachMaria!!!
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
## 2016-09-30 LAB — HIV-1 RNA QUANT/PLASMA: HIV-1 RNA Ultra Quant, Plasma: NOT DETECTED copies/mL

## 2016-09-30 LAB — T CELL SUBSETS, BLOOD
CD4+ T-Cell %: 41 % (ref 29–61)
CD4+ T-Cell Abs: 621 cells/uL (ref 250–1200)
CD4:CD8 Ratio: 1.17 (ref 0.70–4.00)
CD8+ T-Cell %: 35 % (ref 11–38)
CD8+ T-Cell Abs: 529 cells/uL (ref 100–800)

## 2016-10-03 ENCOUNTER — Encounter (INDEPENDENT_AMBULATORY_CARE_PROVIDER_SITE_OTHER): Payer: Medicaid Other | Admitting: Obstetrics & Gynecology

## 2016-10-03 ENCOUNTER — Telehealth (INDEPENDENT_AMBULATORY_CARE_PROVIDER_SITE_OTHER): Payer: Self-pay | Admitting: Obstetrics & Gynecology

## 2016-10-03 ENCOUNTER — Encounter (HOSPITAL_COMMUNITY): Payer: Self-pay | Admitting: Certified Registered Nurse Anesthetist

## 2016-10-03 LAB — QUANTIFERON-TB, BLOOD
Quantiferon TB: NEGATIVE
TB Antigen - Nil: 0.006 [IU]/mL

## 2016-10-03 NOTE — Telephone Encounter (Signed)
GYN TELEPHONE NOTE    Left voicemail for patient.  She was scheduled for preop appointment at 3:30 prior to LEEP procedure tomorrow.  Left instructions for surgery arrival time at 5:30am.  Instructed her to not eat or drink anything after midnight.  Stated that [facility].  If she is unable to come to clinic now, we can sign consent forms in the preop are tomorrow.  Encouraged patient to call clinic to verify that she is still coming for surgery tomorrow.    Merwyn KatosBonnie Joaopedro Eschbach, MD

## 2016-10-04 ENCOUNTER — Ambulatory Visit (HOSPITAL_COMMUNITY): Admission: RE | Admit: 2016-10-04 | Payer: Medicaid Other | Admitting: Obstetrics & Gynecology

## 2016-10-04 ENCOUNTER — Encounter (HOSPITAL_COMMUNITY): Admission: RE | Payer: Self-pay

## 2016-10-04 SURGERY — DILATION AND CURETTAGE, UTERUS
Site: Uterus

## 2016-11-02 ENCOUNTER — Telehealth (INDEPENDENT_AMBULATORY_CARE_PROVIDER_SITE_OTHER): Payer: Self-pay | Admitting: Obstetrics & Gynecology
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
## 2016-11-10 ENCOUNTER — Encounter (HOSPITAL_BASED_OUTPATIENT_CLINIC_OR_DEPARTMENT_OTHER): Payer: Self-pay

## 2016-11-10 ENCOUNTER — Ambulatory Visit: Payer: Medicaid Other | Attending: Infectious Diseases | Admitting: Infectious Diseases

## 2016-11-10 VITALS — BP 134/73 | HR 90 | Temp 98.2°F | Resp 20 | Ht 62.0 in | Wt 146.0 lb

## 2016-11-10 DIAGNOSIS — F1510 Other stimulant abuse, uncomplicated: Principal | ICD-10-CM | POA: Insufficient documentation

## 2016-11-10 DIAGNOSIS — B20 Human immunodeficiency virus [HIV] disease: Secondary | ICD-10-CM | POA: Insufficient documentation

## 2016-11-10 DIAGNOSIS — E559 Vitamin D deficiency, unspecified: Secondary | ICD-10-CM | POA: Insufficient documentation

## 2016-11-10 MED ORDER — VITAMIN D (CHOLECALCIFEROL) 400 UNITS PO TABS
400.0000 [IU] | ORAL_TABLET | Freq: Every day | ORAL | 6 refills | Status: DC
Start: 2016-11-10 — End: 2018-02-02

## 2016-11-10 MED ORDER — EMTRICITAB-RILPIVIR-TENOFOV AF 200-25-25 MG PO TABS
1.0000 | ORAL_TABLET | Freq: Every day | ORAL | 9 refills | Status: DC
Start: 2016-11-10 — End: 2016-12-30
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
## 2016-11-17 ENCOUNTER — Encounter (HOSPITAL_COMMUNITY): Payer: Self-pay | Admitting: Anesthesiology

## 2016-12-21 ENCOUNTER — Ambulatory Visit (HOSPITAL_BASED_OUTPATIENT_CLINIC_OR_DEPARTMENT_OTHER): Payer: Medicaid Other

## 2016-12-22 ENCOUNTER — Ambulatory Visit (HOSPITAL_BASED_OUTPATIENT_CLINIC_OR_DEPARTMENT_OTHER): Payer: Medicaid Other | Admitting: Infectious Diseases

## 2016-12-23 ENCOUNTER — Encounter (HOSPITAL_BASED_OUTPATIENT_CLINIC_OR_DEPARTMENT_OTHER): Payer: Self-pay | Admitting: Infectious Diseases

## 2016-12-29 ENCOUNTER — Encounter (HOSPITAL_BASED_OUTPATIENT_CLINIC_OR_DEPARTMENT_OTHER): Payer: Self-pay | Admitting: Student in an Organized Health Care Education/Training Program

## 2016-12-29 ENCOUNTER — Ambulatory Visit
Payer: Medicaid Other | Attending: Student in an Organized Health Care Education/Training Program | Admitting: Student in an Organized Health Care Education/Training Program

## 2016-12-30 ENCOUNTER — Other Ambulatory Visit (HOSPITAL_BASED_OUTPATIENT_CLINIC_OR_DEPARTMENT_OTHER): Payer: Self-pay | Admitting: Infectious Diseases

## 2016-12-30 DIAGNOSIS — B20 Human immunodeficiency virus [HIV] disease: Principal | ICD-10-CM

## 2017-01-02 MED ORDER — ODEFSEY 200-25-25 MG PO TABS
ORAL_TABLET | ORAL | 0 refills | Status: DC
Start: 2017-01-02 — End: 2017-10-05

## 2017-01-02 NOTE — Telephone Encounter (Signed)
One approval given - please have patient make appointment for further refills

## 2017-01-02 NOTE — Telephone Encounter (Signed)
PHARMACY:[facility]    DATE OF LAST REFILL:12/21    HIV Serology Latest Ref Rng & Units 09/29/2016 01/19/2016 06/23/2015 04/21/2015 12/30/2014 12/16/2014   CD4 Count 250 - 1200 cells/uL 621 515 -- 468 251 --   CD4 Count - External - -- -- -- -- -- --   CD4 % 29 - 61 % 41 41 -- 36 29 --   CD4 % - External - -- -- -- -- -- --   CD8 Count 100 - 800 cells/uL 529 504 -- 539 446 --   CD8 Count - External - -- -- -- -- -- --   CD8 % 11 - 38 % 35 40(H) -- 41(H) 51(H) --   CD8 % - External - -- -- -- -- -- --   CD4:CD8 Ratio 0.70 - 4.00 1.17 1.02 -- 0.87 0.56(L) --   CD4:CD8 Ratio - External - -- -- -- -- -- --   bDNA - -- -- -- -- -- --   RNA-PCR Ultra See Comment copies/mL Not Detected Not Detected Not Detected Not Detected -- 1756(A)   RNA-PCR Ultra - External - -- -- -- -- -- --   RNA-PCR Standard - -- -- -- -- -- --   HIV Ab  - -- -- -- -- -- --   HIV RNA - -- -- -- -- -- --   RNA - PCR Abbott RT - -- -- -- -- -- --   RNA - PCR Abbot RT - External - -- -- -- -- -- --   TaqMan HIV-1 RT-PCR - -- -- -- -- -- --   Some recent data might be hidden     Lab Results   Component Value Date    BUN 12 01/19/2016    CREAT 0.79 01/19/2016    CL 102 01/19/2016    NA 141 01/19/2016    K 3.6 01/19/2016    [place] 9.0 01/19/2016    TBILI 0.89 01/19/2016    ALB 4.5 01/19/2016    TP 6.9 01/19/2016    AST 13 01/19/2016    ALK 39 01/19/2016    BICARB 23 01/19/2016    ALT 12 01/19/2016    GLU 91 01/19/2016     No results found for: A1C  Lab Results   Component Value Date    WBC 7.9 09/29/2016    RBC 4.85 09/29/2016    HGB 15.5 09/29/2016    HCT 43.6 09/29/2016    MCV 89.9 09/29/2016    MCHC 35.6 09/29/2016    RDW 12.0 09/29/2016    PLT 218 09/29/2016    MPV 10.9 09/29/2016     Lab Results   Component Value Date    CHOL 165 12/16/2014    HDL 59 12/16/2014    LDLCALC 78 12/16/2014    TRIG 140 12/16/2014     No results found for: RPR  Lab Results   Component Value Date    COLORUA Straw 12/16/2014    APPEARUA Clear 12/16/2014    GLUCOSEUA Negative 12/16/2014      BILIUA Negative 12/16/2014    KETONEUA Negative 12/16/2014    SGUA 1.013 12/16/2014    BLOODUA Negative 12/16/2014    PHUA 7.0 12/16/2014    PROTEINUA Negative 12/16/2014    UROBILUA Negative 12/16/2014    NITRITEUA Negative 12/16/2014    LEUKESTUA Negative 12/16/2014    WBCUA 0-2 12/16/2014    RBCUA 0-2 12/16/2014     No results found for: PSA  No components found  for: VITAMIND25HYDROXYTOTAL  No components found for: VITAMIND, 25-OHTOTAL

## 2017-01-03 ENCOUNTER — Telehealth (INDEPENDENT_AMBULATORY_CARE_PROVIDER_SITE_OTHER): Payer: Self-pay | Admitting: Obstetrics & Gynecology

## 2017-01-03 DIAGNOSIS — N879 Dysplasia of cervix uteri, unspecified: Principal | ICD-10-CM

## 2017-01-03 NOTE — Telephone Encounter (Signed)
GYN TELEPHONE NOTE    Called patient to discuss upcoming LEEP procedure in 3 days.  She states she is not coming.  Patient showed up to her preop appointment late.  States she was "left in the waiting room for 40 minutes" and was "annoyed" so she left before being seen.    Patient has no-showed to 2 OR appointments for the LEEP already.    RECOMMENDATIONS:    Discussed with patient that I am not going to try to schedule an OR LEEP anymore.  Her last colpo was in 07/2016.  Discussed that she can get repeat colposocopy in the office with pap smear every 6 months.  Emphasized the importance of following cervical dysplasia in the setting of HIV.    - order placed for colposcopy, patient instructed to schedule in Feb or March    Merwyn KatosBonnie Lamiah Marmol, MD

## 2017-01-06 ENCOUNTER — Encounter (HOSPITAL_COMMUNITY): Admission: RE | Payer: Self-pay

## 2017-01-06 ENCOUNTER — Ambulatory Visit (HOSPITAL_COMMUNITY): Admission: RE | Admit: 2017-01-06 | Payer: Medicaid Other | Admitting: Obstetrics & Gynecology

## 2017-01-06 SURGERY — DILATION AND CURETTAGE, UTERUS
Site: Uterus
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
## 2017-02-09 ENCOUNTER — Ambulatory Visit (INDEPENDENT_AMBULATORY_CARE_PROVIDER_SITE_OTHER): Payer: Medicaid Other | Admitting: Obstetrics and Gynecology

## 2017-02-09 ENCOUNTER — Encounter: Payer: Self-pay | Admitting: Obstetrics and Gynecology

## 2017-02-09 VITALS — BP 145/87 | HR 98 | Ht 66.0 in | Wt 150.9 lb

## 2017-02-09 DIAGNOSIS — Z30433 Encounter for removal and reinsertion of intrauterine contraceptive device: Secondary | ICD-10-CM

## 2017-02-09 NOTE — Progress Notes (Signed)
     GYNECOLOGY OFFICE PROCEDURE NOTE  Christine Rice is a 36 y.o. [id number] here for Mirena IUD removal and reinsertion. Patient initially also came for an annual exam, however has had an annual 6 months ago with her PCP. No GYN concerns today.  Last pap smear was on 09/2015 and was normal.  Current Mirena IUD has been in place for 5 years.    IUD Removal and Reinsertion  Patient identified, informed consent performed, consent signed.   Discussed risks of irregular bleeding, cramping, infection, malpositioning or misplacement of the IUD outside the uterus which may require further procedures. Also advised to use backup contraception for one week as the risk of pregnancy is higher during the transition period of removing an IUD and replacing it with another one. Time out was performed. Speculum placed in the vagina. The strings of the IUD were grasped and pulled using ring forceps. The IUD was successfully removed in its entirety. The cervix was cleaned with Betadine x 2 and grasped anteriorly with a single tooth tenaculum.  The new Mirena IUD insertion apparatus was used to sound the uterus to 8.5 cm;  the IUD was then placed per manufacturer's recommendations. Strings trimmed to 2 cm (patient notes having to have prior strings re-trimmed due to partner feeling strings). Tenaculum was removed, good hemostasis noted. Patient tolerated procedure well.   Patient was given post-procedure instructions.  She was reminded to have backup contraception for one week during this transition period between IUDs.  Patient was also asked to check IUD strings periodically and follow up in 4 weeks for IUD check.   Lot #: [id number] Exp: 05/2019   Rubie Maid, MD [facility]

## 2017-02-09 NOTE — Patient Instructions (Signed)

## 2017-05-25 ENCOUNTER — Emergency Department
Admission: EM | Admit: 2017-05-25 | Discharge: 2017-05-25 | Disposition: A | Discharge status: Home / Self Care | Payer: Medicaid Other | Attending: Emergency Medicine | Admitting: Emergency Medicine

## 2017-05-25 DIAGNOSIS — F17210 Nicotine dependence, cigarettes, uncomplicated: Secondary | ICD-10-CM | POA: Insufficient documentation

## 2017-05-25 DIAGNOSIS — N390 Urinary tract infection, site not specified: Principal | ICD-10-CM | POA: Insufficient documentation

## 2017-05-25 DIAGNOSIS — B20 Human immunodeficiency virus [HIV] disease: Secondary | ICD-10-CM | POA: Insufficient documentation

## 2017-05-25 DIAGNOSIS — B9689 Other specified bacterial agents as the cause of diseases classified elsewhere: Secondary | ICD-10-CM | POA: Insufficient documentation

## 2017-05-25 DIAGNOSIS — N760 Acute vaginitis: Secondary | ICD-10-CM | POA: Insufficient documentation

## 2017-05-25 LAB — VAGINOSIS SCREEN
Candida, Vaginal Screen: NEGATIVE
Gardnerella, Vaginal Screen: POSITIVE — AB
Trichomonas, Vaginal Screen: NEGATIVE

## 2017-05-25 LAB — CBC WITH DIFF, BLOOD
ANC-Automated: 7.7 10*3/uL — ABNORMAL HIGH (ref 1.6–7.0)
Abs Eosinophils: 0.1 10*3/uL (ref 0.1–0.5)
Abs Lymphs: 1.5 10*3/uL (ref 0.8–3.1)
Abs Monos: 0.9 10*3/uL — ABNORMAL HIGH (ref 0.2–0.8)
Eosinophils: 1 %
Hct: 39.2 % (ref 34.0–45.0)
Hgb: 13.4 gm/dL (ref 11.2–15.7)
Lymphocytes: 15 %
MCH: 31.5 pg (ref 26.0–32.0)
MCHC: 34.2 g/dL (ref 32.0–36.0)
MCV: 92 um3 (ref 79.0–95.0)
MPV: 9.7 fL (ref 9.4–12.4)
Monocytes: 9 %
Plt Count: 183 10*3/uL (ref 140–370)
RBC: 4.26 10*6/uL (ref 3.90–5.20)
RDW: 12.2 % (ref 12.0–14.0)
Segs: 75 %
WBC: 10.3 10*3/uL — ABNORMAL HIGH (ref 4.0–10.0)

## 2017-05-25 LAB — URINALYSIS WITH CULTURE REFLEX, WHEN INDICATED
Bilirubin: NEGATIVE
Glucose: NEGATIVE
Ketones: NEGATIVE
Nitrite: NEGATIVE
Specific Gravity: 1.031 — ABNORMAL HIGH (ref 1.002–1.030)
Urobilinogen: NEGATIVE
WBC: >50 — AB (ref 0–?)
pH: 5 (ref 5.0–8.0)

## 2017-05-25 LAB — COMPREHENSIVE METABOLIC PANEL, BLOOD
ALT (SGPT): 10 U/L (ref 0–33)
AST (SGOT): 12 U/L (ref 0–32)
Albumin: 4.2 g/dL (ref 3.5–5.2)
Alkaline Phos: 59 U/L (ref 35–140)
Anion Gap: 11 mmol/L (ref 7–15)
BUN: 15 mg/dL (ref 6–20)
Bicarbonate: 25 mmol/L (ref 22–29)
Bilirubin, Tot: 0.4 mg/dL (ref <1.2)
Calcium: 8.9 mg/dL (ref 8.5–10.6)
Chloride: 104 mmol/L (ref 98–107)
Creatinine: 0.9 mg/dL (ref 0.51–0.95)
GFR: >60 mL/min
Glucose: 86 mg/dL (ref 70–99)
Potassium: 3.9 mmol/L (ref 3.5–5.1)
Sodium: 140 mmol/L (ref 136–145)
Total Protein: 6.9 g/dL (ref 6.0–8.0)

## 2017-05-25 LAB — LIPASE, BLOOD: Lipase: 40 U/L (ref 13–60)

## 2017-05-25 MED ORDER — METRONIDAZOLE 500 MG OR TABS
500.0000 mg | ORAL_TABLET | Freq: Three times a day (TID) | ORAL | 0 refills | Status: DC
Start: 2017-05-25 — End: 2017-10-05

## 2017-05-25 MED ORDER — METRONIDAZOLE 500 MG OR TABS
500.0000 mg | ORAL_TABLET | Freq: Three times a day (TID) | ORAL | 0 refills | Status: DC
Start: 2017-05-25 — End: 2017-05-25

## 2017-05-25 MED ORDER — CEPHALEXIN 500 MG OR CAPS
500.0000 mg | ORAL_CAPSULE | Freq: Four times a day (QID) | ORAL | 0 refills | Status: DC
Start: 2017-05-25 — End: 2017-05-25

## 2017-05-25 MED ORDER — CEPHALEXIN 500 MG OR CAPS
500.0000 mg | ORAL_CAPSULE | Freq: Four times a day (QID) | ORAL | 0 refills | Status: DC
Start: 2017-05-25 — End: 2017-10-05

## 2017-05-25 MED ORDER — IBUPROFEN 600 MG OR TABS
600.0000 mg | ORAL_TABLET | Freq: Once | ORAL | Status: AC
Start: 2017-05-25 — End: 2017-05-25
  Administered 2017-05-25: 600 mg via ORAL
  Filled 2017-05-25: qty 1

## 2017-05-25 NOTE — ED Notes (Signed)
Pt arrived c/o bilateral groin pain for the last couple days.  States pain comes and goes.  + urinary pain.  + vaginal discharge.

## 2017-05-25 NOTE — ED Notes (Signed)
Assisting Primary RN:  Patient cleared for discharge. Paperwork printed and reviewed in detail with patient at bedside. New script for Keflex and Flagyl [facility] [place]. Dose, route, time, and potential side effects discussed. Patient advised to follow up with PCP. Reasons to return to ED discussed. Patient able to verbalize understanding of instructions, and is agreeable for discharge. IV removed and pressure dressing applied. Vital signs obtained and WNL. Patient independently ambulatory to exit.

## 2017-05-25 NOTE — ED Notes (Signed)
Assumed care of pt, resting in gurney NAD. Blood sent to lab at this time. Pt has lower pelvic pain, pelvic exam done, urine sent, pending results and re-eval.

## 2017-05-25 NOTE — Discharge Instructions (Signed)
Vaginal Irritation    You have been seen for irritation of your vagina.    There are several reasons why this area can become irritated. They are:   Trauma (injury or shock) while having sex.   Infections like ones from yeast or a sexually transmitted disease (STD).   Use of tampons or other foreign bodies in the vagina.   Foreign bodies in the vagina for an extended period of time. This could be a tampon, piece of toilet paper, a birth control device, or a toy used during sex.   Contact with detergents or other irritants on underwear and other clothing.    Your vaginal symptoms seem to be from an infection. Your doctor has prescribed antibiotics for this.    You should not have sex until your irritation is completely gone.    Do not use tampons or other foreign bodies until the irritation is completely gone.    Follow up with your family doctor about your vaginal irritation.    YOU SHOULD SEEK MEDICAL ATTENTION IMMEDIATELY, EITHER HERE OR AT THE NEAREST EMERGENCY DEPARTMENT, IF ANY OF THE FOLLOWING OCCUR:   Your symptoms get worse.   You have any other problems or concerns.   You have worse vaginal discharge or bleeding.   You develop fever (temperature higher than 100.86F / 38C).      Vaginal Irritation    You have been seen for irritation of your vagina.    There are several reasons why this area can become irritated. They are:   Trauma (injury or shock) while having sex.   Infections like ones from yeast or a sexually transmitted disease (STD).   Use of tampons or other foreign bodies in the vagina.   Foreign bodies in the vagina for an extended period of time. This could be a tampon, piece of toilet paper, a birth control device, or a toy used during sex.   Contact with detergents or other irritants on underwear and other clothing.    You had vaginal cultures today. These results will not be available right away. The medical staff will tell you how to get the results when they are  ready.    You should not have sex until your irritation is completely gone.    Do not use tampons or other foreign bodies until the irritation is completely gone.    Follow up with your family doctor about your vaginal irritation.    YOU SHOULD SEEK MEDICAL ATTENTION IMMEDIATELY, EITHER HERE OR AT THE NEAREST EMERGENCY DEPARTMENT, IF ANY OF THE FOLLOWING OCCUR:   Your symptoms get worse.   You have any other problems or concerns.   You have worse vaginal discharge or bleeding.   You develop fever (temperature higher than 100.86F / 38C).                  Urinary Tract Infection, Lower    You have been diagnosed with an uncomplicated lower urinary tract infection (UTI).     A UTI is an infection in your bladder. Your doctor diagnosed it by testing your urine. UTIs usually causes burning with urination (peeing) or frequent urination. It might make you feel like you have to urinate even when you dont.     UTI is usually treated with antibiotics and medicine to help with pain.    It is VERY IMPORTANT that you fill your prescription and take all of the antibiotics as directed. If a lower urinary tract infection goes untreated  for too long, it can become a kidney infection.    FOR WOMEN: To reduce the risk of getting UTIs again:   Always urinate before and after sexual intercourse.   Always wipe from front to back after urinating or having a bowel movement. Do not wipe from back to front.   Drink plenty of fluids. Try to drink cranberry or blueberry juice. These juices have a chemical that stops bacteria from sticking to the bladder.    YOU SHOULD SEEK MEDICAL ATTENTION IMMEDIATELY, EITHER HERE OR AT THE NEAREST EMERGENCY DEPARTMENT, IF ANY OF THE FOLLOWING OCCURS:   You have a fever (temperature higher than 100.80F / 38C) or shaking chills.   You feel nauseated or vomit.   You have pain in your side or back.   You dont get better after taking all of your antibiotics.   You have any new symptoms  or concerns.   You feel worse or do not improve.    If you can't follow up with your doctor, or if at any time you feel you need to be rechecked or seen again, come back here or go to the nearest emergency department.

## 2017-05-25 NOTE — ED Notes (Signed)
Pelvic completed and specimens sent to lab.

## 2017-05-25 NOTE — ED Notes (Addendum)
Pt anxious to leave, pending pelvic results. Will inform ERMD, states will not leave with IV in. Given ice chips and updated on plan of care.

## 2017-05-25 NOTE — ED Notes (Signed)
All in house results back. Will inform ERMD.

## 2017-05-25 NOTE — ED Provider Notes (Signed)
Emergency Department Note  [facility] electronic medical record reviewed for pertinent medical history.     Patient: Jacqueline Fernandez, MRN [id number], DOB Oct 19, 1981  The Date of Service for the Emergency Room encounter is 05/25/2017  5:23 PM     Nursing Triage Note :   Chief Complaint   Patient presents with    Abdominal Pain     bilateral lower abdomiinal pain x 5 days. loose stools. denies n/v. intermittent chills. denies dysuria. possible vaginal discharge       HPI :   Jacqueline Fernandez is a 36 year old female with PMHx as below notable for HIV and h/o BTL (2014) presents with bilateral lower abdominal pain and vaginal discharge. States onset 1-week ago, was associated with her period. Pain worsening over the last week, now 8-10/10 which prompted current ER visit. States will occur for 3-4hours before spontaneously resolving. Characterized as cramp-like, but more intense than menstrual cramps. +vaginal bleeding during this time. Unclear of alleviating or worsening symtpoms. +nausea, no vomiting.   No h/o STI/STD. Loose stools but denies diarrhea. No new sexual partners.     Denies F/ dysuria/ hematuria/ flank pain/ hematochezia/ melana/ diaphoresis/ chills/ CP/ palpitations/ SOB/ cough/ hemoptysis/ congestion/ sore throat/ HA/ neck pain or stiffness/ photophobia/ blurry or double vision/ recent unexplained weight loss     Past Medical History :   Past Medical History:   Diagnosis Date    HIV disease (CMS-HCC)        Past Surgical history : No past surgical history on file.  B/L tubal ligation    Family History:   Family History   Problem Relation Age of Onset    No Known Problems Mother     No Known Problems Father        Social History:  Social History   Substance Use Topics    Smoking status: Current Every Day Smoker     Packs/day: 1.00     Types: Cigarettes    Smokeless tobacco: Never Used    Alcohol use No     Alcohol: denies  Tobacco: 1PPD  Illicit drugs: denies  Housed/     Medications:   Prior to Admission  Medications   Prescriptions Last Dose Informant Patient Reported? Taking?   ODEFSEY 200-25-25 MG tablet   No No   Sig: take 1 tablet once daily with food   cholecalciferol (VITAMIN D) 400 UNITS TABS tablet   No No   Sig: Take 1 tablet (400 Units) by mouth daily.      Facility-Administered Medications: None       Allergies: Review of patient's allergies indicates no known allergies.    Review of Systems:  Complete review of 12 systems reviewed and negative unless otherwise noted in the HPI or above. This was done per my custom and practice for systems appropriate to the chief complaint in an emergency department setting and varies depending on the quality of history that the patient is able to provide. History reviewed today as available from records and EPIC chart.      Physical Exam:   05/25/17  1633   BP: 138/78   Pulse: (!) 111   Resp: 20   Temp: 98.4 F (36.9 C)   SpO2: 99%       Nursing note and vitals reviewed. Pt not hypoxic.  Gen: Patient is in NAD, non-toxic appearing, cooperative  HEENT: NC/AT, PERRL, EOMI, MMM, no conjunctival injection, b/l sclera anicteric, oropharynx clear, no LAD  Neck:  Supple. No midline tenderness. No meningeal signs.  Cardiovascular: RRR no m/r/g, normal heart sounds, no JVD  Pulmonary/Chest: CTAB, no increased WOB, no respiratory distress, no wheezes/rhonchi/rales, no chest wall tenderness.   Abdominal: Soft. ND, +BS, no r/g, no HSM, +ttp suprapubic, neg murphy  Extr/MSK: Well perfused, distal pulses intact. No edema. No tenderness. FROM.  Back: No CVAT   Neuro: No evidence of facial droop, normal speech, mentation appropriate, steady gait.   Psychiatric: Normal affect. Mood not labile nor depressed.   Skin: No rashes, lesions, or wounds.   GU: No hernias, normal external genitalia   Pelvic: No CMT, cervix non-erythematous, +white discharge, no adnexal tenderness    Limited Bedside Ultrasound Procedure: Pelvic (GYN)   Indication  A focused ultrasound exam of the pelvis was  performed to evaluate for pelvic pain in the non-pregnant patient. The ultrasound was performed with the following indications, as noted in the H&P: pelvic pain and abdominal pain.    Probe: Transvaginal Endocavitary (8-193mHz)    Identified Structures  Each of the following structures were examined in both short and long axis:  Uterus and Pouch of [place]  Right Adnexa  Left Adnexa    Findings   Exam of the above structures revealed the following findings:  Uterus: present   (If Present: normal Detail: Fibroids )  Right Adnexa: normal           Ovary: Present, normal            Details: no cysts, follicles present, good blood flow (eg. cyst size, color/spectral flow)  Left Adnexa: normal        Ovary: Present, normal        Details: no cysts, follicles present, good blood flow (eg. cyst size, color/spectral flow)  Free fluid in Cul de sac: absent  Other findings: No cervical tenderness with endocavitary probe    Impression   Normal gynecologic ultrasound    Study reviewed with attending Dr. Orvan Falconerampbell.    ED Course & Clinical Decision Making:  36 year old  female with PMHx as listed in HPI presents pelvic pain    Vitals: wnl/ stable/ afebrile  Pertinent Exam Findings: Fibroids on TVUS, no CMT, no free fluid cul de sac, no adnexal ttp; white discharge     DDx: STI/STD vs menstrual cramps vs fibroids vs UTI; Initial workup to include basic labs, vaginal swabs for GC/Chlamydia and vaginosis. Will also get UA with culture. Reassuring exam, patient laughing and moving without pain/joking with boyfriend at home. Very low clinical suspicion of life threatening cause of gynecological etiology or surgical emergency, such as PID, peritonitis/acute abdomen, ovarian torsion, ruptured or ectopic pregnancy, fitz-hugh-curtis, or systemic infection.    Will continue to reassess following initial labs. Anticipated discharge home with outpatient follow up.     Very low clinical suspicion of life threatening or emergent causes of  abdominal pain such as AAA, aortic dissection, acute mesenteric ischemia, perforated viscous, biliary or hepatic etiology, pancreatitis, appendicitis, diverticulitis, complete SBO    Orders Placed This Encounter   Procedures    Urinalysis with Culture Reflex, when indicated     Medications   ibuprofen (MOTRIN) tablet 600 mg (600 mg Oral Given 05/25/17 1715)     Workup Review   Asa LenteKutz, Zain Bingman Jerome's Documentation   Value Comment Time   Gardnerella, Vaginal Screen: (!) POSITIVE (Reviewed) 06/14 2044     Patient with positive gardnerella, likely etiology of current pain. No CMT, therefore doubt PID. GC/Chlamydia not resulted,  but pending results. Discussed with patient we will contact if swabs positive. Patient UA with possible UTI, although squamous cells on sample. Will empirically treat with keflex. Also treat with flagyl for vaginosis. Patient currently without pain, well appearing. No indication for inpatient management at this time.     ER return precautions discussed, patient states understanding with capacity. Patient agrees to follow up with outpatient PCP within next few days for continued medical evaluation, reassessment/monitoring and adjustment of current treatments if deemed necessary.     I reevaluated the patient. The patient was discharged home, status improved, with instructions regarding supportive care, medications, and appropriate follow up. I discussed indications for seeking immediate medical attention, and the patient voiced understanding. The patient was discharged home in stable condition after all questions were answered.    I have discussed my thought process and plan for the patient with the attending physician, Marissa Calamity*.    Champ Mungo, MD, PhD  [facility] Resident, PGY1           Starr Lake, MD, Ph.D  Resident  05/26/17 0300       Marissa Calamity, MD  05/27/17 2019

## 2017-05-25 NOTE — ED MD Progress Note (Signed)
Workup Review   Others' Documentation   Value Comment By Time   Gardnerella, Vaginal Screen: (!) POSITIVE (Reviewed) Starr LakeKutz, Craig Jerome, MD, Ph.D 06/14 2044     Pt requested rx sent to pharmacy that is listed a preferred.  Sent over 2 Rx.    Karleen DolphinJames Corbett-Detig, MD  Emergency Medicine

## 2017-05-27 LAB — URINE CULTURE: Urine Culture Result: <10000 — AB

## 2017-05-27 NOTE — ED Follow-up Note (Signed)
Follow-up type: Callback       Routine ED Patient Call Back    Patient contacted by telephone:  found no issues; patient doing fine.

## 2017-05-27 NOTE — ED Follow-up Note (Signed)
ED Micro Cx Follow-up Note on 05/27/2017:    Follow up for culture results from ED visit on 05/25/17:    Urine Cx positive for Streptococcus agalactiae (Group B strep) 50,000 - 100,000 colonies/mL , patient had been discharged with Keflex 10-day course.      Given culture result, pt is receiving the appropriate antibiotics treatment. Continue the treatment as noted by ED provider.     Results    Urine Culture (Order [id number])         Lab Specimen Information     Collection Date Collection Time Received Date Received Time Specimen Type Specimen Comment Lab Specimen # Resulting lab    05/25/2017 16:51 05/25/2017 20:54 Urine URINE [id number] LABORATORY [60]          Urine Culture (Order [id number]) - Reflex for Order [id number]             Urinalysis with Culture Reflex, when indicated (Order [id number])         Result Information     Date and Time Status       Collected: 05/25/2017 4:51 PM Final result -- Abnormal           Urine Culture (Order [id number]) - Reflex for Order [id number]            05/27/2017 10:22 AM - Electronic Interface To Epic, [facility] Lab Results      Component Results     Component Value Lab    Urine Culture Result (Abnormal) <10,000 colonies/mL Mixed Urethral Flora [facility]    Urine Culture (Abnormal) Streptococcus agalactiae (Group B strep)   50,000 - 100,000 colonies/mL   Identification performed by [employer]( [facility]To[facility]). This test   was developed and its performance characteristics determined by [facility]. It has not been cleared or   approved by the U.S. Food and Drug Administration. The FDA has   determined that such clearance or approval is not necessary.    [facility]      Testing Performed By     Lab - Abbreviation Name Director Address Valid Date Range    [facility] Gwenyth Ober, M.D., Ph.D. [address]  [place] [address] 05/08/13 1012-Present      Lab and Collection     Urine Culture (Order #[id number])  on 05/25/2017 - Lab and Collection Information      Result History     Urine Culture (Order #[id number]) on 05/27/2017 - Order Result History Report          Urinalysis with Culture Reflex, when indicated (Order [id number])            05/25/2017 8:55 PM - Electronic Interface To Epic, [facility] Lab Results      Component Results     Component Value Flag Ref Range Units Status Lab    Type Not Specified    Final HC    Color Yellow  Yellow  Final HC    Appearance Hazy  Clear  Final HC    Specific Gravity 1.031 (H) 1.002 - 1.030  Final HC    pH 5.0  5.0 - 8.0  Final HC    Protein 1+ (A) Negative  Final HC    Glucose Negative  Negative  Final HC    Ketones Negative  Negative  Final HC    Bilirubin Negative  Negative  Final HC    Blood 1+ (A) Negative  Final HC    Urobilinogen Negative  Negative  Final HC    Nitrite Negative  Negative  Final HC    Leuk Esterase 3+ (A) Negative  Final HC    WBC >50 (A) 0-2/HPF  Final HC    RBC 6-10 (A) 0-2/HPF  Final HC    Bacteria Many (A) None-Rare/HPF  Final HC    Squam. Epithelial Cell 6-10(FEW) (A) <6-10(FEW)  Final HC    Mucus Rare  None-Rare/HPF  Final HC      Testing Performed By     Lab - Abbreviation Name Director Address Valid Date Range    [facility] Gwenyth Ober, M.D., Ph.D. [address]  [place] [address] 05/08/13 1026-Present      Lab and Collection     Urinalysis with Culture Reflex, when indicated (Order #[id number]) on 05/25/2017 - Lab and Collection Information      Result History     Urinalysis with Culture Reflex, when indicated (Order #[id number]) on 05/25/2017 - Order Result History Report          Urine Culture (Order [id number]) - Reflex for Order [id number]             Urinalysis with Culture Reflex, when indicated (Order [id number])         Reviewed By List     Neill Loft, PHARMD on 05/27/2017 2:47 PM    Normajean Baxter, PHARMD on 05/27/2017 2:12 PM      Full Order Details     Urine Culture (Order  #[id number]) on 05/25/17      View SmartLink Info     Urine Culture (Order #[id number]) on 05/25/17      Order    Urine Culture [[id number]] (Order [id number])                   Patient Information     Patient Name Sex    Margorie, Renner ([id number]) Female    DOB AGE: [birth year] (36 year old)            CSN        Order Information     Date and Time Department Released By Authorizing    05/25/2017 5:28 PM [facility] (auto-released) Theodoro Kalata, MD      Electronic Signatures     Electronically Authorized By    Theodoro Kalata, MD    Electronically Ordered By: Electronic Interface To Epic, [facility] Lab Results      Full Order Details     Urine Culture (Order [id number]) on 05/25/17        Collection Date/Time:  __________/_______  Phlebotomist Initials:    __________________      Priority and Order Details     Priority Order Status    STAT Completed    Class: Nurse/MD Collect      Start Date/Time     Start Date Start Time    05/25/17 1651       Frequency    ONCE      Specimen Information     Specimen Type: Urine       Collected: 05/25/2017 4:51 PM       Resulting Agency: LABORATORY                Process Instructions     [place] Collection: BD Boric Acid Vacutainer Tube       For Nephrostomy and Suprapubic specimens, Gram stain must be ordered separately.  No admitting diagnosis found       Principal Hospital Problem:       <principal problem not specified>               Print Lab Requisition     Urine Culture (Order #[id number]) on 05/25/17

## 2017-05-29 LAB — CHLAMYDIA/GONORRHEA PCR, GENITAL
Chlamydia trachomatis PCR: NOT DETECTED
Neisseria gonorrhoeae PCR: DETECTED — AB

## 2017-05-29 NOTE — ED Follow-up Note (Signed)
ED Microbiology Result Follow-up    Follow up for culture results from ED visit on 05/25/17 for pelvic pain, vaginal discharge.    Chlamydia/GC PCR Cx results positive for Gonococcus.  Patient had been discharged with 10-day course of metronidazole  500 mg PO TID and Keflex 500 mg PO QID for UTI and BV. She was never treated for gonorrhea.    Discussed with ED Attending Dr. Rolm BaptiseHeerboth who would like patient to make a PCP appointment for a ceftriaxone 250 mg IM x 1 injection for gonorrhea.     Spoke with patient who confirms she will call [facility] to make an appointment. Provided patient with clinic phone number. Answered patients questions. Educated patient to refrain from sexual activity until 7 days after treatment in addition to being asymptomatic.    Lab Specimen Information     Collection Date Collection Time Received Date Received Time Specimen Type Specimen Source Lab Specimen # Resulting lab    05/25/2017 18:59 05/25/2017 21:56 Genital Cervix [id number] LABORATORY [60]      Result Information     Date and Time Status       Collected: 05/25/2017 6:59 PM Final result -- Abnormal          05/29/2017 2:45 PM - Electronic Interface To Epic, [facility] Lab Results      Component Results     Component Value Flag Ref Range Units Status Lab    Source Chlamydia/GC PCR Genital    Final [facility]    Chlamydia PCR Not Detected  See Comment  Final [facility]    Comment:    Chlamydia trachomatis DNA not detected   ............................................................       Gonococcus PCR DETECTED (A) See Comment  Final [facility]    Comment:    Neisseria gonorrhoeae DNA DETECTED   ............................................................   This is an FDA-approved real-time PCR Assay for the   qualitative detection of DNA from Chlamydia trachomatis   and Neisseria gonorrhea. Because the test detects DNA,   it may be positive even when these organisms are no   longer viable. This test is not intended to be used   as the  sole means for clinical diagnosis.                                .   Expected result: Not Detected           Yitta Gongaware H. Danella Pentonruong, PharmD  PGY1 Acute Care Pharmacy Resident  Pgr: [phone]

## 2017-08-09 ENCOUNTER — Telehealth (HOSPITAL_BASED_OUTPATIENT_CLINIC_OR_DEPARTMENT_OTHER): Payer: Self-pay

## 2017-08-09 NOTE — Telephone Encounter (Signed)
Attempted follow-up w/ pt, number is not in service.

## 2017-09-18 ENCOUNTER — Telehealth (HOSPITAL_BASED_OUTPATIENT_CLINIC_OR_DEPARTMENT_OTHER): Payer: Self-pay

## 2017-09-18 NOTE — Telephone Encounter (Signed)
Call from pt from new # [phone].  Needs help w/ housing, mental health services for her children, and support getting back into medical care.  Still living in same rental in [place] and receiving PARS but stated that she wants safer housing for her children.  Provided support and agreed to schedule f-u w/ Dr. Shaune Leeks.  Attempted to schedule face to face f/u.  Pt agreed to call me back after son's IEP meeting to schedule appt w/ me next week.

## 2017-09-27 ENCOUNTER — Encounter (HOSPITAL_BASED_OUTPATIENT_CLINIC_OR_DEPARTMENT_OTHER): Payer: Self-pay

## 2017-10-05 ENCOUNTER — Ambulatory Visit: Payer: Medicaid Other | Attending: Infectious Diseases | Admitting: Infectious Diseases

## 2017-10-05 ENCOUNTER — Other Ambulatory Visit (HOSPITAL_BASED_OUTPATIENT_CLINIC_OR_DEPARTMENT_OTHER): Payer: Medicaid Other

## 2017-10-05 VITALS — BP 125/81 | HR 92 | Temp 97.6°F | Resp 19 | Ht 62.0 in | Wt 146.0 lb

## 2017-10-05 DIAGNOSIS — B20 Human immunodeficiency virus [HIV] disease: Principal | ICD-10-CM

## 2017-10-05 DIAGNOSIS — Z23 Encounter for immunization: Secondary | ICD-10-CM | POA: Insufficient documentation

## 2017-10-05 DIAGNOSIS — F1020 Alcohol dependence, uncomplicated: Secondary | ICD-10-CM | POA: Insufficient documentation

## 2017-10-05 DIAGNOSIS — R87613 High grade squamous intraepithelial lesion on cytologic smear of cervix (HGSIL): Secondary | ICD-10-CM | POA: Insufficient documentation

## 2017-10-05 DIAGNOSIS — F1510 Other stimulant abuse, uncomplicated: Secondary | ICD-10-CM | POA: Insufficient documentation

## 2017-10-05 LAB — CBC WITH DIFF, BLOOD
ANC-Automated: 3.5 10*3/uL (ref 1.6–7.0)
Abs Eosinophils: 0.2 10*3/uL (ref 0.1–0.5)
Abs Lymphs: 1.8 10*3/uL (ref 0.8–3.1)
Abs Monos: 0.5 10*3/uL (ref 0.2–0.8)
Basophils: 1 %
Eosinophils: 3 %
Hct: 41.4 % (ref 34.0–45.0)
Hgb: 14 gm/dL (ref 11.2–15.7)
Lymphocytes: 30 %
MCH: 29.7 pg (ref 26.0–32.0)
MCHC: 33.8 g/dL (ref 32.0–36.0)
MCV: 87.7 um3 (ref 79.0–95.0)
MPV: 11.4 fL (ref 9.4–12.4)
Monocytes: 9 %
Plt Count: 183 10*3/uL (ref 140–370)
RBC: 4.72 10*6/uL (ref 3.90–5.20)
RDW: 12.5 % (ref 12.0–14.0)
Segs: 58 %
WBC: 6 10*3/uL (ref 4.0–10.0)

## 2017-10-05 LAB — COMPREHENSIVE METABOLIC PANEL, BLOOD
ALT (SGPT): 13 U/L (ref 0–33)
AST (SGOT): 15 U/L (ref 0–32)
Albumin: 4.4 g/dL (ref 3.5–5.2)
Alkaline Phos: 52 U/L (ref 35–140)
Anion Gap: 14 mmol/L (ref 7–15)
BUN: 9 mg/dL (ref 6–20)
Bicarbonate: 22 mmol/L (ref 22–29)
Bilirubin, Tot: 0.45 mg/dL (ref <1.2)
Calcium: 9 mg/dL (ref 8.5–10.6)
Chloride: 103 mmol/L (ref 98–107)
Creatinine: 0.65 mg/dL (ref 0.51–0.95)
GFR: >60 mL/min
Glucose: 83 mg/dL (ref 70–99)
Potassium: 4.3 mmol/L (ref 3.5–5.1)
Sodium: 139 mmol/L (ref 136–145)
Total Protein: 7.3 g/dL (ref 6.0–8.0)

## 2017-10-05 MED ORDER — BICTEGRAVIR-EMTRICITAB-TENOFOV 50-200-25 MG PO TABS
1.0000 | ORAL_TABLET | Freq: Every day | ORAL | 3 refills | Status: DC
Start: 2017-10-05 — End: 2018-02-02

## 2017-10-05 NOTE — Progress Notes (Signed)
[place] VISIT    Last seen 10/2016 - not on schedule but accommodated    Patient ID:  Jacqueline Fernandez is a 36 year old woman with HIV (CD4 251, VL 1K on 12/30/14) diagnosed on in jail 10/07/14. Started Stribild 12/30/14 but caused nausea, switched to Complera March 2016, then [facility] 04/2015.     CC: Follow-up HIV, cervical dysplasia, meth abuse    HPI:   Has not been here since November.    Was not taking meds regularly so stopped taking it altogether. 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 Not working since 07/2017 when stopped working at 07/11/23, stopped because was dead-end job. Getting son (71yo) an IEP. Has team working wtih her son. Got PARS in December, but feels financially overwhelmed.    Is now drinking nightly "until it is gone" and using meth regularly.  She's ashamed by this but will try to go back to meetings.  She is not working, but does feel better about herself when she works.  She had a boyfriend for a while but ended that, back with children.  Still interacts occasionally with prior boyfriend but he is using drugs.  She feels where she is living is a bad influence on her life.      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), HSIL pap 07/2016  History of Polysubstance abuse with current relapse    PSH:  Tubal ligation 01/25/2009    Review of Systems:  Constitutional: denies fever, chills, night sweats, unintentional weight loss  Skin: denies rash, lesions, or ulcer  HEENT: denies teary/ runny eyes, runny nose, HA, sinus pain, vision changes, sore throat, nasal congestion, difficulty to swallow, or pain with swallowing  Pulmonary: denies cough, wheezing, SOB  Cardiac: denies CP, palpitation  Gastro-Intestinal/Biliary/Hepatic: denies abdominal pain, nausea/ vomiting, constipation  GU: No vaginal dyscharge, dysuria  Neurological: denies numbness/ tingling, pain with ambulation  Musculoskeletal: denies leg or joint swelling, pain  Psych: See HPI      Current Outpatient Prescriptions on  File Prior to Visit   Medication Sig Dispense Refill    cephALEXin (KEFLEX) 500 MG capsule Take 1 capsule (500 mg) by mouth 4 times daily. 40 capsule 0    cholecalciferol (VITAMIN D) 400 UNITS TABS tablet Take 1 tablet (400 Units) by mouth daily. 30 tablet 6    metroNIDAZOLE (FLAGYL) 500 MG tablet Take 1 tablet (500 mg) by mouth 3 times daily. 30 tablet 0    ODEFSEY 200-25-25 MG tablet take 1 tablet once daily with food 90 tablet 0     No current facility-administered medications on file prior to visit.        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 Released from jail 01/2015 and completed rehabilitation program.   Started using meth at age 44. Used until 21, then started shooting it.   Thinks got HIV from sharing needles.  Used to use meth but more recently was drinking whiskey a lot on an empty stomach.   Was drunk and arrested for drunk in public. 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  Found out she had HIV.    Has a 36yo girl , 36yo (introvert) and 36yo (extrovert) boys who now live with her, having trouble.    Flat-lined when giving birth to youngest - was traumatic for her  Sober x9 months then drank in 06/2015, now using meth  and heavy alcohol use    Brother in prison for being in bar at time of a bar fight resulting in stabbing of someone in town, brother didn't do the stabbing but got very long prison sentence, but it is being reexamined this year    From here, moved out to [place] with her mom at age 39 ([place]). Lived there for 9 years, then came back here to escape friends.        No Known Allergies    Physical Exam:  BP 125/81 (BP Location: Left arm, BP Patient Position: Sitting, BP cuff size: Regular)   Pulse 92   Temp 97.6 F (36.4 C) (Oral)   Resp 19   Ht '5\' 2"'$  (1.575 m)   Wt 66.2 kg (146 lb)   SpO2 100%   BMI 26.7 kg/m2  Body mass index is 26.7 kg/(m^2).  GEN: well developed, well nourished, NAD  HEENT:  Normocephalic, atraumatic,  EOMI, eyes slightly injected. Oropharynx clear without thrush or oral hairy leukoplakia  CHEST: breathing comfortably on RA  BJE: no edema/clubbing  NEURO: A&Ox3, grossly wnl  Psych: linear thought process, appropriate, emotional  SKIN: no rashes    Studies  Lab Results   Component Value Date    THELPERABS 621 09/29/2016    HIV1RNAULTRA Not Detected 09/29/2016       Lab Results   Component Value Date    WBC 10.3 (H) 05/25/2017    RBC 4.26 05/25/2017    HGB 13.4 05/25/2017    HCT 39.2 05/25/2017    MCV 92.0 05/25/2017    MCHC 34.2 05/25/2017    RDW 12.2 05/25/2017    PLT 183 05/25/2017    MPV 9.7 05/25/2017    LYMPHS 15 05/25/2017    MONOS 9 05/25/2017    EOS 1 05/25/2017     No results found for: INR  Lab Results   Component Value Date    HIV1RNAULTRA Not Detected 09/29/2016     Lab Results   Component Value Date    BUN 15 05/25/2017    CREAT 0.90 05/25/2017    CL 104 05/25/2017    NA 140 05/25/2017    K 3.9 05/25/2017    [place] 8.9 05/25/2017    TBILI 0.40 05/25/2017    ALB 4.2 05/25/2017    TP 6.9 05/25/2017    AST 12 05/25/2017    ALK 59 05/25/2017    BICARB 25 05/25/2017    ALT 10 05/25/2017    GLU 86 05/25/2017     Lab Results   Component Value Date    AST 12 05/25/2017    ALT 10 05/25/2017    ALK 59 05/25/2017    TP 6.9 05/25/2017    ALB 4.2 05/25/2017    TBILI 0.40 05/25/2017    DBILI <0.2 04/21/2015     Lab Results   Component Value Date    CHOL 165 12/16/2014    HDL 59 12/16/2014    LDLCALC 78 12/16/2014    TRIG 140 12/16/2014     Lab Results   Component Value Date    COLORUA Yellow 05/25/2017    APPEARUA Hazy 05/25/2017    GLUCOSEUA Negative 05/25/2017    BILIUA Negative 05/25/2017    KETONEUA Negative 05/25/2017    SGUA 1.031 (H) 05/25/2017    BLOODUA 1+ (A) 05/25/2017    PHUA 5.0 05/25/2017    PROTEINUA 1+ (A) 05/25/2017    UROBILUA Negative 05/25/2017    NITRITEUA Negative  05/25/2017    LEUKESTUA 3+ (A) 05/25/2017    WBCUA >50 (A) 05/25/2017    RBCUA 6-10 (A) 05/25/2017     Lab  Results   Component Value Date    THELPERABS 621 09/29/2016    HIV1RNAULTRA Not Detected 09/29/2016     Path:  -07/2016 CIN1 biopsy, HSIL on pap      ASSESSMENT AND PLAN  Zylpha Poynor is a 36 year old year old woman with HIV, alcoholism, meth abuse here for followup    1. HIV: Diagnosed 09/2014, started on Stribild 12/30/14 and switched to Complera due to nausea. CD4 251/29% and VL 1756 prior to starting meds. Was UD on Odefsey.  Of x months  - start [place]   - labs today  - close follow-up    # Relapse with history of polysubstance abuse: Sober after incarceration 2015, drank 2016, sober 06/2015, now drinking nightly using meth regularly  - extensive discussion. Needs to go back to her meetings, reach out to support in our program and rehab.    #LGSIL/HSIL  - resend to [facility]    #Social:  Poor insight, struggling    S/p tubal ligation     # Health maintenance:  Flu shot: today  Tdap: 12/30/14   Prevnar: 12/16/14  Quantiferon: negative 12/30/14   Zoster: NA  Mammogram: NA  Colonoscopy: NA  Pap smear - cervical: LGSIL - referred for colpo  Pap smear - anal: deferred  STD screening - 12/16/14 GC/Chlamydia urine and syphilis EIA negative; BV screen and GC/Chlamydia neg  Hepatitis A:  Vaccinated  Hepatitis B: Vaccinated  Hepatitis C: negative  Dental cleaning: seeing dentist    Follow-up 6 weeks

## 2017-10-05 NOTE — Patient Instructions (Signed)
You are strong!!!!!!    Get labs today, then start Biktarvy once a day at the same time every day.    Find strength in yourself to get away from bad habits.  Go to meetings and use resources to make yourself a prouder independent woman.    Your kids need and love you and look up to you.    I will see you after Thanksgiving and look forward to seeing you then.

## 2017-10-05 NOTE — Interdisciplinary (Signed)
Nurse Immunization Documentation Note    Diagnosis:     ICD-10-CM ICD-9-CM    1. Human immunodeficiency virus (HIV) disease (CMS-HCC) B20 042 T Cell Subsets, Blood [place]      HIV-1 RNA Quant, Blood - See Instructions      CBC w/ Diff [place]      Comprehensive Metabolic Panel Green      [facility]      bictegravir-emtricitabine-tenofovir (BIKTARVY) 50-200-25 MG tablet   2. HSIL on Pap smear of cervix R87.613 795.04 [facility]   3. Need for vaccination Z23 V05.9 Flu Vaccine, IM >=3 Years       Jacqueline Fernandez is a 36 year old year old female, DOB 1981/10/18, allergic to Review of patient's allergies indicates no known allergies..  The patient was identified by name and date of birth. Allergies were verified.  The patient was educated about possible medication side effects and the procedure was explained to the patient.         Patient Education    Jacqueline LundJessica Rae Fernandez is a 36 year old female whose chief complaint is HIV and Stress    Vitals:    10/05/17 1132   BP: 125/81   BP Location: Left arm   BP Patient Position: Sitting   BP cuff size: Regular   Pulse: 92   Resp: 19   Temp: 97.6 F (36.4 C)   TempSrc: Oral   SpO2: 100%   Weight: 66.2 kg (146 lb)   Height: 5\' 2"  (1.575 m)     Pain Score 0  Body surface area is 1.7 meters squared.  Body mass index is 26.7 kg/(m^2).    Patient Education  Identified learning needs: Medications, Uses, Dose, Side Effects  Learner: Patient  Barriers to learning: No Barriers  Readiness to learn: Acceptance  Method: Explanation and Handout  Treatment education given: No  Fall prevention education given: No  Pain education given: No  Response: Verbalizes understanding

## 2017-10-06 LAB — T CELL SUBSETS, BLOOD
CD4+ T-Cell %: 44 % (ref 29–61)
CD4+ T-Cell Abs: 818 cells/uL (ref 250–1200)
CD4:CD8 Ratio: 1.16 (ref 0.70–4.00)
CD8+ T-Cell %: 37 % (ref 11–38)
CD8+ T-Cell Abs: 702 cells/uL (ref 100–800)

## 2017-10-06 LAB — HIV-1 RNA QUANT/PLASMA: HIV-1 RNA Ultra Quant, Plasma: 62 copies/mL — AB

## 2017-10-09 NOTE — Progress Notes (Signed)
Reason for encounter/ visit: CM follow up face to face in office        Presenting Problem: At risk of falling out of care, Medication adherence challenges and Psychosocial Stressors    Health Status: ARV Medication non-adherence and Dental    Mental Health Status: Depressed and Substance Abuse - Active    Social Protective Factors: Some financial support from mom who lives in [place]    Social Risk Factors: Social Isolation and Non-Disclosure Health    Language Spoken: English    Sexual Health:Monogamous Relationship    Housing: Marginally housed    Funding: Fixed income and [employer]    Impressions:  Pt has been out of care for over 1 year.  Admitted that she struggles w/ ARV adherence.  Has been depressed since boyfriend was incarcerated last month d/t drug dealing charges.  Admitted that drinking more than usual.  Overwhelmed w/ care of 3 children.  Her youngest age 36 getting evaluated @ school under Sp Ed d/t behavioral concerns.  Has not been able to find employment and on a fixed income.  She cannot afford current housing even w/ PARS subsidy.        Clinical Interventions:  Assessed emotional health and provided supportive counseling.  Completed care plan and SAMISS.  Agreed to help her schedule medical f-u w/ Dr. Shaune LeeksAbeles.      Services Provided:  Non-Medical Case Management: Yes  Medical Case Management: Assessment- Yes;Care plan - Yes  Mental Health: Screening - Yes;Counseling - Yes     Substance abuse screening: Yes      Childcare: No  Emergency financial assistance: No  Health Education: Yes      Housing: Yes                             Psychosocial support: Yes     Treatment adherence: Yes                                                                 Plan: Improve physical functioning, Improve mental health functioning, Reduce psychosocial stressors, Retain in care, Improve financial stability, Enhance parenting skills and Improve adherence     Referrals made: Individual counseling, Family counseling,  Substance abuse tx outpatient and Dental

## 2017-11-23 ENCOUNTER — Encounter (HOSPITAL_BASED_OUTPATIENT_CLINIC_OR_DEPARTMENT_OTHER): Payer: Self-pay | Admitting: Infectious Diseases

## 2017-11-23 ENCOUNTER — Ambulatory Visit: Attending: Infectious Diseases | Admitting: Infectious Diseases

## 2017-11-23 VITALS — BP 113/67 | HR 97 | Temp 98.1°F | Resp 16 | Ht 62.0 in | Wt 146.6 lb

## 2017-11-23 DIAGNOSIS — B20 Human immunodeficiency virus [HIV] disease: Secondary | ICD-10-CM | POA: Insufficient documentation

## 2017-11-23 DIAGNOSIS — R87613 High grade squamous intraepithelial lesion on cytologic smear of cervix (HGSIL): Principal | ICD-10-CM | POA: Insufficient documentation

## 2017-11-23 DIAGNOSIS — Z23 Encounter for immunization: Secondary | ICD-10-CM | POA: Insufficient documentation

## 2017-11-23 NOTE — Interdisciplinary (Signed)
Nurse Immunization Documentation Note    Diagnosis:     ICD-10-CM ICD-9-CM    1. HGSIL (high grade squamous intraepithelial lesion) on Pap smear of cervix R87.613 795.04 [facility]   2. Need for vaccination Z23 V05.9 Pneumococcal (PNEUMOVAX-23)   3. Human immunodeficiency virus (HIV) disease (CMS-HCC) B20 042 Mychart Access Code Procedure      HIV-1 RNA Quant, Blood - See Instructions       Jacqueline Fernandez is a 36 year old year old female, DOB [birth year], allergic to Patient has no known allergies..  The patient was identified by name and date of birth. Allergies were verified.  The patient was educated about possible medication side effects and the procedure was explained to the patient.       Patient Education    Jacqueline LundJessica Rae Fernandez is a 36 year old female whose chief complaint is HIV    Vitals:    11/23/17 1124   BP: 113/67   BP Location: Left arm   BP Patient Position: Sitting   BP cuff size: Regular   Pulse: 97   Resp: 16   Temp: 98.1 F (36.7 C)   TempSrc: Oral   SpO2: 99%   Weight: 66.5 kg (146 lb 9.6 oz)   Height: 5\' 2"  (1.575 m)     Pain Score 0  Body surface area is 1.71 meters squared.  Body mass index is 26.81 kg/m.    Patient Education  Identified learning needs: Rehabilitation, Mobility, Safety  Learner: Patient  Barriers to learning: No Barriers  Readiness to learn: Acceptance  Method: Explanation  Treatment education given: Yes  Fall prevention education given: Yes  Pain education given: Yes  Response: Verbalizes understanding    Jacqueline LundJessica Rae Fernandez  is a 36 year old,female who presents for a Nurse Visit today for medication adminstration.    No Known Allergies    The patient was identified by name and date of birth.   Allergies were reviewed.    MEDICATION ADMINISTRATION  MEDICATION ORDERED:   DOSE: 0.315ml  ROUTE: IM (intramuscular)  Pneumonia Shot PCV 23 vaccine  SITE:left arm      Possible medication side effects were reviewed and the procedure was explained to the patient.  Patient verbalized understanding of above.    VIS given for any vaccine administration       Controlled substance wasted: No  Witness necessary

## 2017-11-23 NOTE — Patient Instructions (Signed)
Colposcopy

## 2017-11-23 NOTE — Progress Notes (Signed)
[name]    Came 15 min late, seen promptly    Patient ID:  Jacqueline Fernandez is a 36 year old woman with HIV (CD4 251, VL 1K on 12/30/14) diagnosed on in jail 10/07/14. Started Stribild 12/30/14 but caused nausea, switched to Complera March 2016, then [facility] 04/2015, now [employer]. Struggles with alcohol and meth abuse     CC: Follow-up HIV, cervical dysplasia, meth abuse    HPI:   Last seen 09/2017 at which point drinking heavily and using meth and off meds and not working since 2018.  Was put back on meds, here for follow-up.    Reports taking Biktarvy but forgetting - missed 2ce this week.  Still drinking, but doesn't find it excessive - says she bought a gallon of whiskey week of Thanksgiving and it lasted a week - noone else drank it - she does not find this excessive/problematic.  She says she is using meth "but not that much" because she has to avoid it for 2 weeks before checking in with parole officer, uses it as a "reward" for dealing with kids / housework.  She does not have goals for self.  She was with a man who just got 10 years imprisonment for drug dealing.  She had fallen for him and felt he had accepted her with her HIV and she says she felt taken care of.  Never expected to be single mom on her own.  She still is without work.  No physical complaints. She has not gone to [facility] health for colposcopy.  She refuses flu shot.    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/2016  History of Polysubstance abuse with current relapse    PSH:  Tubal ligation 01/25/2009    Review of Systems:  Constitutional: denies fever, chills, night sweats, unintentional weight loss  Skin: denies rash, lesions, or ulcer  HEENT: denies teary/ runny eyes, runny nose, HA, sinus pain, vision changes, sore throat, nasal congestion, difficulty to swallow, or pain with swallowing  Pulmonary: denies cough, wheezing, SOB  Cardiac: denies CP, palpitation  Gastro-Intestinal/Biliary/Hepatic: denies abdominal  pain, nausea/ vomiting, constipation  GU: No vaginal dyscharge, dysuria  Neurological: denies numbness/ tingling, pain with ambulation  Musculoskeletal: denies leg or joint swelling, pain  Psych: See HPI      Current Outpatient Medications on File Prior to Visit   Medication Sig Dispense Refill    bictegravir-emtricitabine-tenofovir (BIKTARVY) 50-200-25 MG tablet Take 1 tablet by mouth daily. 30 tablet 3    cholecalciferol (VITAMIN D) 400 UNITS TABS tablet Take 1 tablet (400 Units) by mouth daily. 30 tablet 6     No current facility-administered medications on file prior to visit.        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 Released from jail 01/2015 and completed rehabilitation program.   Started using meth at age 36. Used until 21, then started shooting it.   Thinks got HIV from sharing needles.  Used to use meth but more recently was drinking whiskey a lot on an empty stomach.   Was drunk and arrested for drunk in public. 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  Found out she had HIV.    Has a 36yo girl , 36yo (introvert) and 37yo (extrovert) boys who now live with her, having trouble.    Flat-lined when giving birth to youngest - was traumatic  for her  Sober x9 months then drank in 06/2015, now using meth and heavy alcohol use    Brother in prison for being in bar at time of a bar fight resulting in stabbing of someone in town, brother didn't do the stabbing but got very long prison sentence, but it is being reexamined this year    From here, moved out to [place] with her mom at age 28 ([place]). Lived there for 9 years, then came back here to escape friends.        No Known Allergies    Physical Exam:  BP 113/67 (BP Location: Left arm, BP Patient Position: Sitting, BP cuff size: Regular)    Pulse 97    Temp 98.1 F (36.7 C) (Oral)    Resp 16    Ht '5\' 2"'$  (1.575 m)    Wt 66.5 kg (146 lb 9.6 oz)    SpO2 99%    BMI 26.81  kg/m   Body mass index is 26.81 kg/m.  GEN: well developed, well nourished, NAD  HEENT: Normocephalic, atraumatic,  EOMI, eyes slightly injected. Oropharynx clear without thrush or oral hairy leukoplakia  CHEST: breathing comfortably on RA  BJE: no edema/clubbing  NEURO: A&Ox3, grossly wnl  Psych: linear thought process, calm  SKIN: no rashes    Studies  Lab Results   Component Value Date    THELPERABS 818 10/05/2017    HIV1RNAULTRA 62 (A) 10/05/2017       Lab Results   Component Value Date    WBC 6.0 10/05/2017    RBC 4.72 10/05/2017    HGB 14.0 10/05/2017    HCT 41.4 10/05/2017    MCV 87.7 10/05/2017    MCHC 33.8 10/05/2017    RDW 12.5 10/05/2017    PLT 183 10/05/2017    MPV 11.4 10/05/2017    LYMPHS 30 10/05/2017    MONOS 9 10/05/2017    EOS 3 10/05/2017    BASOS 1 10/05/2017     No results found for: INR  Lab Results   Component Value Date    HIV1RNAULTRA 62 (A) 10/05/2017     Lab Results   Component Value Date    BUN 9 10/05/2017    CREAT 0.65 10/05/2017    CL 103 10/05/2017    NA 139 10/05/2017    K 4.3 10/05/2017    [place] 9.0 10/05/2017    TBILI 0.45 10/05/2017    ALB 4.4 10/05/2017    TP 7.3 10/05/2017    AST 15 10/05/2017    ALK 52 10/05/2017    BICARB 22 10/05/2017    ALT 13 10/05/2017    GLU 83 10/05/2017     Lab Results   Component Value Date    AST 15 10/05/2017    ALT 13 10/05/2017    ALK 52 10/05/2017    TP 7.3 10/05/2017    ALB 4.4 10/05/2017    TBILI 0.45 10/05/2017    DBILI <0.2 04/21/2015     Lab Results   Component Value Date    CHOL 165 12/16/2014    HDL 59 12/16/2014    LDLCALC 78 12/16/2014    TRIG 140 12/16/2014     Lab Results   Component Value Date    COLORUA Yellow 05/25/2017    APPEARUA Hazy 05/25/2017    GLUCOSEUA Negative 05/25/2017    BILIUA Negative 05/25/2017    KETONEUA Negative 05/25/2017    SGUA 1.031 (H) 05/25/2017    BLOODUA  1+ (A) 05/25/2017    PHUA 5.0 05/25/2017    PROTEINUA 1+ (A) 05/25/2017    UROBILUA Negative 05/25/2017    NITRITEUA Negative 05/25/2017    LEUKESTUA 3+ (A)  05/25/2017    WBCUA >50 (A) 05/25/2017    RBCUA 6-10 (A) 05/25/2017     Lab Results   Component Value Date    THELPERABS 818 10/05/2017    HIV1RNAULTRA 62 (A) 10/05/2017     Path:  -07/2016 CIN1 biopsy, HSIL on pap      ASSESSMENT AND PLAN  Jacqueline Fernandez is a 36 year old year old woman with HIV, alcoholism, meth abuse here for followup    1. HIV: Diagnosed 09/2014, started on Stribild 12/30/14 and switched to Complera due to nausea. CD4 251/29% and VL 1756 prior to starting meds. Was UD on Odefsey.  Off x months  - cont Biktarvy   - labs today  - close follow-up    # Relapse with history of polysubstance abuse: Sober after incarceration 2015, drank 2016, sober 06/2015, now drinking nightly using meth regularly  - extensive discussion again.  She has poor insight and does not have goals for herself. Will try reach out to support in our program and rehab.    #HGSIL  - resend to [facility] - referred again. Reinforced this is very important    #Social:  Poor insight, struggling  - she requests diagnosis letter and pet companion letter (new dog that helps her feel much better).  CM agreed to assist with these after clinic today    S/p tubal ligation     # Health maintenance:  Flu shot: utd  Tdap: 12/30/14   Prevnar: 12/16/14  Pneumovax: DUE  Quantiferon: negative 12/30/14   Shingrix: NA  Mammogram: NA  Colonoscopy: NA  Pap smear - cervical: LGSIL - referred for colpo  Pap smear - anal: deferred  STD screening - 12/16/14 GC/Chlamydia urine and syphilis EIA negative; BV screen and GC/Chlamydia neg  Hepatitis A:  Vaccinated  Hepatitis B: Vaccinated  Hepatitis C: negative  Dental cleaning: seeing dentist    Follow-up 6 weeks

## 2017-11-28 ENCOUNTER — Encounter (HOSPITAL_BASED_OUTPATIENT_CLINIC_OR_DEPARTMENT_OTHER): Payer: Self-pay

## 2017-11-28 NOTE — Progress Notes (Signed)
Pt seen @ [facility].  Aware that she needs to schedule colpo and agreed to do so after the holidays.  Continues attending [facility] outpt tx but still drinking here and there.  Looking for work & struggling financially.  Mom is helping w/ rent.  Feels overwhelmed w/ life stressors.  Provided supportive counseling.  Identified some support from friends and her mom.  Explained that SW will be on vacation next week but if need of SW support 26th-28th, she can call main [facility] # and speak to any SW available.   Grateful for holiday gift donations.    Etter SjogrenMaria Thomes Burak, [place]  [phone]

## 2017-12-18 ENCOUNTER — Telehealth (HOSPITAL_BASED_OUTPATIENT_CLINIC_OR_DEPARTMENT_OTHER): Payer: Self-pay

## 2017-12-18 NOTE — Telephone Encounter (Addendum)
Pt calling back, states she spoke with a Nurse named Leonie DouglasMary Lou and was advised that she is ok to schedule Colpo. Pt was advised [facility] at [facility] does not have a nurse named Leonie DouglasMary Lou and her referral has not been approved by the  Colpo Review RN as of yet. Pt has [facility]. Pt was advised her referral & records would need to be [facility] & MD prior to scheduling. Pt provided me a telephone number for Leonie DouglasMary Lou [phone]). I called Leonie DouglasMary Lou, who works for [employer] [facility] (per outgoing VM message) and advised her to return the call to advise her that [facility] for this department need to be reviewed prior to scheduling. Pt states she "does not have time for this with her kids and stuff".  Pt became frustrated, swearing during call  & disconnected the call when Call Center Agent attempted explain the process on how Colpo referrals need to be reviewed prior to scheduling & that I would contact Leonie DouglasMary Lou to advise her also.     Message sent as Lorain ChildesFYI

## 2017-12-18 NOTE — Telephone Encounter (Signed)
NEW MOS REFERRAL    1. Was the patient just registered by you? No     2. What is the reason patient needs to be seen? Colpo     3. Does this referral require an auth?  No       A.) if so was [place] entered? No     4. Which folder in the fax server was the referral placed in?    Patient has internal referral for colpo, Please review and contact pt at [phone]. Thank you!

## 2017-12-19 NOTE — Telephone Encounter (Signed)
Referral records from Dr Shaune LeeksAbeles, [place] reviewed - HGSIL, HIV - internal referral to colpo folder for Friday review by MD.

## 2017-12-22 NOTE — Telephone Encounter (Signed)
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     Schedule for colposcopy first available (colpo clinic, [id number]    Discussed with Dr. Minna MerrittsKingston     Jacqueline Vanstone Ramos-Rivera, [place] [phone]  Reproductive Medicine - PGY2  12/22/17 5:51 PM

## 2017-12-27 NOTE — Telephone Encounter (Signed)
Please schedule during Colpo clinic, authorization will expire 01/24/2018.    Authorization letter placed in scanning bin.

## 2018-01-04 ENCOUNTER — Ambulatory Visit: Attending: Infectious Diseases | Admitting: Infectious Diseases

## 2018-01-04 ENCOUNTER — Ambulatory Visit (HOSPITAL_BASED_OUTPATIENT_CLINIC_OR_DEPARTMENT_OTHER): Admitting: Infectious Diseases

## 2018-01-04 ENCOUNTER — Encounter (HOSPITAL_BASED_OUTPATIENT_CLINIC_OR_DEPARTMENT_OTHER): Payer: Self-pay

## 2018-01-04 ENCOUNTER — Encounter (HOSPITAL_BASED_OUTPATIENT_CLINIC_OR_DEPARTMENT_OTHER): Payer: Self-pay | Admitting: Infectious Diseases

## 2018-01-04 VITALS — BP 125/81 | HR 96 | Temp 98.4°F | Resp 18 | Ht 62.0 in | Wt 150.4 lb

## 2018-01-04 DIAGNOSIS — F430 Acute stress reaction: Secondary | ICD-10-CM

## 2018-01-04 DIAGNOSIS — F1011 Alcohol abuse, in remission: Secondary | ICD-10-CM

## 2018-01-04 DIAGNOSIS — F439 Reaction to severe stress, unspecified: Principal | ICD-10-CM

## 2018-01-04 DIAGNOSIS — F1511 Other stimulant abuse, in remission: Secondary | ICD-10-CM

## 2018-01-04 NOTE — Telephone Encounter (Signed)
Patient ready to be scheduled for colpo appointment.  Message routed to colpo scheduler to call and offer this patient a colpo appointment.
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
## 2018-01-05 ENCOUNTER — Ambulatory Visit (HOSPITAL_BASED_OUTPATIENT_CLINIC_OR_DEPARTMENT_OTHER): Admitting: Psychiatry

## 2018-01-10 ENCOUNTER — Telehealth (HOSPITAL_BASED_OUTPATIENT_CLINIC_OR_DEPARTMENT_OTHER): Payer: Self-pay | Admitting: Infectious Diseases

## 2018-01-10 NOTE — Telephone Encounter (Signed)
Leonie DouglasMary Lou from [facility] department calling back regarding Pt's referral. Leonie DouglasMary Lou was advised on the [facility] protocols and that the Pt's referral was sent for review and was just recently approved for scheduling on 12/22/17. Leonie DouglasMary Lou states she will contact the Pt to see if she'd like to schedule the Colpo and have the Pt call to schedule. Leonie DouglasMary Lou also requested to have Dr. Dorothea GlassmanBlanchard's office contacted to see if Pt's referral could be extended.     Message sent as Lorain ChildesFYI

## 2018-01-10 NOTE — Telephone Encounter (Signed)
Spoke with Fritzi MandesQuentin at the [facility] to request an extension on Pt's referral auth. Per Fritzi MandesQuentin, Pt's insurance is now inactive and Pt would need to update her insurance. Fritzi MandesQuentin states he will try and reach out to the Pt directly to check the status on updating her insurance. Fritzi MandesQuentin to follow up with me directly on this matter later this week.

## 2018-01-10 NOTE — Telephone Encounter (Signed)
LVN called and talked leah regarding previous message. Leah requesting to extend referral for colposcopy due to scheduling for patient. Next available to possibly schedule would be 01/19/2018 or if pt is unable to make it the next available would be 01/26/2018. Informed Tacey RuizLeah, would have to see If pt has active insurance and will follow up.    LVN collaborated with front desk staff and pt was seen for a no charge visit on 01/04/2018 due to no bringing documents to extend [employer]. Currently no active insurance on file. LVN attempted to call pt but unable to leave VM due to mailbox being full.     Leah notified and will follow up Thursday or Friday.

## 2018-01-10 NOTE — Telephone Encounter (Signed)
Leah from [facility] woman[facility] dept. Is following up on colposcopy PA status. Requesting a call back with PA status.     Please review and assist.
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
## 2018-01-19 NOTE — Telephone Encounter (Signed)
LVN called pt regarding previous message. Verified name and DOB. Pt stated she updated her [employer] insurance yesterday and would like to schedule appointment with [facility]. Informed pt will collaborate with Leah on Monday and follow up with patient.

## 2018-01-22 NOTE — Telephone Encounter (Signed)
Spoke with Fritzi MandesQuentin from [facility] and was advised that he was able to reach the patient and was advised the Pt updated her insurance information when she was last seen. Per Fritzi MandesQuentin, nothing has been documented in the Pt's chart and he will follow up with the [facility] front desk/auth staff. States once [employer] P[employer] FinancialPt's insurance has been updated, he will have Dr. Shaune LeeksAbeles submit a new referral & authorization for Pt's Colpo as Pt's current authorization will expire on 01/24/18. Fritzi MandesQuentin to contact me directly once Pt's new referral has been authorized and insurance has been updated.

## 2018-01-23 ENCOUNTER — Ambulatory Visit (HOSPITAL_BASED_OUTPATIENT_CLINIC_OR_DEPARTMENT_OTHER)

## 2018-01-25 NOTE — Telephone Encounter (Addendum)
Received a call from V[facility]Quentin from [facility] advising Pt was a no show for her appts on 2/12. Pt was to update her insurance info with financial services for [facility] prior to her appts. Fritzi MandesQuentin states attempts to reach the Pt to discuss this were unsuccessful as unable to reach Pt on the phone or leave messages as Pt's voicemail box was full. Pt has a pending appt with Dr. Shaune LeeksAbeles on 2/28 and will attempt to discuss Pt's insurance situation if and when Pt comes in. Fritzi MandesQuentin states he will keep me updated on this matter in case Pt still desires to schedule Colposcopy.

## 2018-01-25 NOTE — Telephone Encounter (Signed)
LVN called Leah and informed pt missed appointment to set up [employer]. Pt has no active insurance. Financial counselor was unable to contact patient.

## 2018-01-31 ENCOUNTER — Other Ambulatory Visit (HOSPITAL_BASED_OUTPATIENT_CLINIC_OR_DEPARTMENT_OTHER): Payer: Self-pay | Admitting: Infectious Diseases

## 2018-01-31 DIAGNOSIS — B20 Human immunodeficiency virus [HIV] disease: Principal | ICD-10-CM

## 2018-02-02 ENCOUNTER — Telehealth (HOSPITAL_BASED_OUTPATIENT_CLINIC_OR_DEPARTMENT_OTHER): Payer: Self-pay

## 2018-02-02 ENCOUNTER — Encounter (INDEPENDENT_AMBULATORY_CARE_PROVIDER_SITE_OTHER): Payer: Self-pay | Admitting: Infectious Diseases

## 2018-02-02 DIAGNOSIS — E559 Vitamin D deficiency, unspecified: Secondary | ICD-10-CM

## 2018-02-02 DIAGNOSIS — B20 Human immunodeficiency virus [HIV] disease: Secondary | ICD-10-CM

## 2018-02-02 MED ORDER — BICTEGRAVIR-EMTRICITAB-TENOFOV 50-200-25 MG PO TABS
1.0000 | ORAL_TABLET | Freq: Every day | ORAL | 3 refills | Status: DC
Start: 2018-02-02 — End: 2023-06-05

## 2018-02-02 MED ORDER — VITAMIN D (CHOLECALCIFEROL) 400 UNITS PO TABS
400.0000 [IU] | ORAL_TABLET | Freq: Every day | ORAL | 6 refills | Status: DC
Start: 2018-02-02 — End: 2023-07-11

## 2018-02-02 NOTE — Telephone Encounter (Signed)
Call from pt, needs ARV refill.  Dr. Shaune LeeksAbeles sent to pharmacy today.  Ongoing issue w/ medi-cal-- not sure if active.  Will go to cty office today.  Pt overwhelmed w/ lack on income and at risk of eviction.   Aware that colpo is pending.  Agreed to meet Monday at 12 to discuss housing issues and other psychosocial stressors.

## 2018-02-02 NOTE — Progress Notes (Signed)
Refilled Biktarvy and Vit D

## 2018-02-05 ENCOUNTER — Other Ambulatory Visit (HOSPITAL_BASED_OUTPATIENT_CLINIC_OR_DEPARTMENT_OTHER): Payer: Self-pay | Admitting: Infectious Diseases

## 2018-02-05 ENCOUNTER — Encounter (HOSPITAL_BASED_OUTPATIENT_CLINIC_OR_DEPARTMENT_OTHER): Payer: Self-pay

## 2018-02-05 DIAGNOSIS — B20 Human immunodeficiency virus [HIV] disease: Principal | ICD-10-CM

## 2018-02-05 NOTE — Progress Notes (Signed)
Reason for encounter/ visit: CM follow up face to face in office        Presenting Problem: At risk of falling out of care, Medication adherence challenges and Psychosocial Stressors    Health Status: Food insecurity, Detectable viral load and ARV Medication non-adherence    Mental Health Status: Unstable , Depressed, Substance Abuse - History, Substance Abuse - Active, Trauma, Needs MH Care, Needs Psychiatry and Engaged in Substance Recovery    Social Protective Factors: Engaged in outpt tx at [facility], Engineer, drillingprobation officer, children.    Social Risk Factors: Trauma, Social Isolation and Non-Disclosure Health    Language Spoken: English    Sexual Health:Condom Use    Housing: Marginally housed and At risk - homelessness    Funding: No Income and Medi-Cal    Impressions: Pt tearful and overwhelmed over housing situation and overall stress level.  Admitted that she owes back rent and was hoping to wait to file taxes to pay her landlord.  Thinks she will be evicted soon.  Understood that she is at risk of PARS termination if she is not able to pay her share of the rent.  Not open to shelter or emergency housing resources.  Agreed to [facility] and understands that she is at great risk of homelessness.  Pt [facility].      Clinical Interventions: SW assessed emotional health and provided supportive counseling.  Pt inquired about legal resources-- feels that landlord has disclosed her dx to other tenants.      Services Provided:  Non-Medical Case Management: Yes  Medical Case Management: Monitoring - Yes  Mental Health: Screening - Yes;Counseling - Yes                         Housing: Yes    Legal: Yes                        Psychosocial support: Yes     Treatment adherence: Yes                                                                 Plan: Improve physical functioning, Improve mental health functioning, Reduce psychosocial stressors, Retain in care, Improve  financial stability, Reduce Psychiatric symptoms and Receive Specialty Care    Referrals made: Individual counseling at [facility], Seeking [facility], [facility], Legal @ [facility], and housing resources @ [facility].

## 2018-02-06 ENCOUNTER — Emergency Department
Admission: EM | Admit: 2018-02-06 | Discharge: 2018-02-06 | Discharge status: Against Medical Advice | Payer: Medicaid Other | Attending: Emergency Medicine | Admitting: Emergency Medicine

## 2018-02-06 ENCOUNTER — Emergency Department (EMERGENCY_DEPARTMENT_HOSPITAL): Payer: Medicaid Other

## 2018-02-06 DIAGNOSIS — Z0389 Encounter for observation for other suspected diseases and conditions ruled out: Secondary | ICD-10-CM

## 2018-02-06 DIAGNOSIS — Z5321 Procedure and treatment not carried out due to patient leaving prior to being seen by health care provider: Secondary | ICD-10-CM

## 2018-02-06 DIAGNOSIS — B20 Human immunodeficiency virus [HIV] disease: Secondary | ICD-10-CM | POA: Insufficient documentation

## 2018-02-06 DIAGNOSIS — M79672 Pain in left foot: Secondary | ICD-10-CM | POA: Insufficient documentation

## 2018-02-06 DIAGNOSIS — F17210 Nicotine dependence, cigarettes, uncomplicated: Secondary | ICD-10-CM | POA: Insufficient documentation

## 2018-02-06 NOTE — Discharge Instructions (Signed)
Patient eloped from ER

## 2018-02-06 NOTE — ED Notes (Signed)
Bed: T-04  Expected date:   Expected time:   Means of arrival:   Comments:
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
## 2018-02-06 NOTE — ED MD Progress Note (Signed)
Attempted to see patient 3 times in the past 30 min but patients bed is unoccupied. Will allow another to pass and then declare elopement if not present. Xrays done and no injuries or deformities seen.

## 2018-02-07 ENCOUNTER — Other Ambulatory Visit (HOSPITAL_BASED_OUTPATIENT_CLINIC_OR_DEPARTMENT_OTHER): Payer: Self-pay | Admitting: Infectious Diseases

## 2018-02-07 DIAGNOSIS — B20 Human immunodeficiency virus [HIV] disease: Secondary | ICD-10-CM

## 2018-02-07 NOTE — Telephone Encounter (Signed)
PHARMACY:  :  [facility], [place].    DATE OF LAST REFILL: 01/02/17    HIV Serology Latest Ref Rng & Units 10/05/2017 09/29/2016 01/19/2016 06/23/2015 04/21/2015   CD4 Count 250 - 1,200 cells/uL 818 621 515 -- 468   CD4 Count - [facility] - -- -- -- -- --   CD4 Count - [facility] - -- -- -- -- --   CD4 Count - External - -- -- -- -- --   CD4 % 29 - 61 % 44 41 41 -- 36   CD4 % - [facility] - -- -- -- -- --   CD4 % - [facility] - -- -- -- -- --   CD4 % - External - -- -- -- -- --   CD8 Count 100 - 800 cells/uL 702 529 504 -- 539   CD8 Count - [facility] - -- -- -- -- --   CD8 Count - [facility] - -- -- -- -- --   CD8 Count - External - -- -- -- -- --   CD8 % 11 - 38 % 37 35 40(H) -- 41(H)   CD8 % - [facility] - -- -- -- -- --   CD8 % - External - -- -- -- -- --   CD4:CD8 Ratio 0.70 - 4.00 1.16 1.17 1.02 -- 0.87   CD4:CD8 Ratio - [facility] - -- -- -- -- --   CD4:CD8 Ratio - [facility] - -- -- -- -- --   CD4:CD8 Ratio - External - -- -- -- -- --   bDNA - -- -- -- -- --   RNA-PCR Ultra See Comment copies/mL 62(A) Not Detected Not Detected Not Detected Not Detected   RNA-PCR Ultra - External - -- -- -- -- --   RNA-PCR Standard - -- -- -- -- --   RNA-PCR - [facility] - -- -- -- -- --   RNA-PCR - [facility] - -- -- -- -- --   HIV Ab  - -- -- -- -- --   HIV RNA - -- -- -- -- --   RNA - PCR Abbott RT - -- -- -- -- --   RNA - PCR Abbot RT - External - -- -- -- -- --   TaqMan HIV-1 RT-PCR - -- -- -- -- --   Some recent data might be hidden     No results found for: AMPCLASS, BARBCLASS, BENZYLCGNN, BENZDIAZCL, METHADONE, OPIATESCL, OXY, PHENCYCLDN, TETCANNABIN, UDI  No results found for: 6AM, CODEINE, MOR, FEN, HCOD, HMOR, NFEN, EDDPU, METHD, OXYC, OXYM, OPINT   No results found for: 7AMCL, AHYMI, AHYTR, AHYAL, 2HYEF, LORA, OXAZE, DESAL, TEMA, NORDI, BEINT  Lab Results   Component Value Date    BUN 9 10/05/2017    CREAT 0.65 10/05/2017    CL 103 10/05/2017    NA 139 10/05/2017    K 4.3 10/05/2017    [place] 9.0 10/05/2017    TBILI  0.45 10/05/2017    ALB 4.4 10/05/2017    TP 7.3 10/05/2017    AST 15 10/05/2017    ALK 52 10/05/2017    BICARB 22 10/05/2017    ALT 13 10/05/2017    GLU 83 10/05/2017     No results found for: A1C  Lab Results   Component Value Date    WBC 6.0 10/05/2017    RBC 4.72 10/05/2017    HGB 14.0 10/05/2017    HCT 41.4 10/05/2017    MCV 87.7 10/05/2017  MCHC 33.8 10/05/2017    RDW 12.5 10/05/2017    PLT 183 10/05/2017    MPV 11.4 10/05/2017     Lab Results   Component Value Date    CHOL 165 12/16/2014    HDL 59 12/16/2014    LDLCALC 78 12/16/2014    TRIG 140 12/16/2014     No results found for: RPR  Lab Results   Component Value Date    COLORUA Yellow 05/25/2017    APPEARUA Hazy 05/25/2017    GLUCOSEUA Negative 05/25/2017    BILIUA Negative 05/25/2017    KETONEUA Negative 05/25/2017    SGUA 1.031 (H) 05/25/2017    BLOODUA 1+ (A) 05/25/2017    PHUA 5.0 05/25/2017    PROTEINUA 1+ (A) 05/25/2017    UROBILUA Negative 05/25/2017    NITRITEUA Negative 05/25/2017    LEUKESTUA 3+ (A) 05/25/2017    WBCUA >50 (A) 05/25/2017    RBCUA 6-10 (A) 05/25/2017     No results found for: PSA  Vitamin D, 25-OH D2 (ng/mL)   Date Value   01/19/2016 <5     Vitamin D, 25-OH D3 (ng/mL)   Date Value   01/19/2016 25     Vitamin D, 25-OH TOTAL (ng/mL)   Date Value   01/19/2016 25

## 2018-02-08 ENCOUNTER — Ambulatory Visit (HOSPITAL_BASED_OUTPATIENT_CLINIC_OR_DEPARTMENT_OTHER): Payer: Medicaid Other | Admitting: Infectious Diseases

## 2018-02-08 NOTE — ED Follow-up Note (Signed)
Follow-up type: Callback       Routine ED Patient Call Back    Patient contacted by telephone:  found no issues; patient doing fine. Pt states she had to leave the ER to take her children to their baseball game, she is a single mom. Discussed her Xray results with patient that were remarkable, states she is ambulating normally and her pain is under control.

## 2018-02-10 ENCOUNTER — Other Ambulatory Visit (HOSPITAL_BASED_OUTPATIENT_CLINIC_OR_DEPARTMENT_OTHER): Payer: Self-pay | Admitting: Infectious Diseases

## 2018-02-10 DIAGNOSIS — B20 Human immunodeficiency virus [HIV] disease: Principal | ICD-10-CM

## 2018-02-16 ENCOUNTER — Ambulatory Visit (INDEPENDENT_AMBULATORY_CARE_PROVIDER_SITE_OTHER): Payer: Medicaid Other | Admitting: Obstetrics and Gynecology

## 2018-02-16 ENCOUNTER — Encounter: Payer: Self-pay | Admitting: Obstetrics and Gynecology

## 2018-02-16 VITALS — BP 119/79 | HR 89 | Ht 66.0 in | Wt 146.9 lb

## 2018-02-16 DIAGNOSIS — R5383 Other fatigue: Secondary | ICD-10-CM | POA: Diagnosis not present

## 2018-02-16 DIAGNOSIS — Z124 Encounter for screening for malignant neoplasm of cervix: Secondary | ICD-10-CM

## 2018-02-16 DIAGNOSIS — R82998 Other abnormal findings in urine: Secondary | ICD-10-CM

## 2018-02-16 DIAGNOSIS — R8290 Unspecified abnormal findings in urine: Secondary | ICD-10-CM

## 2018-02-16 DIAGNOSIS — Z975 Presence of (intrauterine) contraceptive device: Secondary | ICD-10-CM | POA: Diagnosis not present

## 2018-02-16 DIAGNOSIS — Z01419 Encounter for gynecological examination (general) (routine) without abnormal findings: Secondary | ICD-10-CM

## 2018-02-16 LAB — POCT URINALYSIS DIPSTICK
Bilirubin, UA: NEGATIVE
Glucose, UA: NEGATIVE
KETONES UA: NEGATIVE
Leukocytes, UA: NEGATIVE
Nitrite, UA: NEGATIVE
PROTEIN UA: NEGATIVE
Spec Grav, UA: 1.025 (ref 1.010–1.025)
Urobilinogen, UA: 0.2 E.U./dL
pH, UA: 6.5 (ref 5.0–8.0)

## 2018-02-16 NOTE — Patient Instructions (Signed)

## 2018-02-16 NOTE — Progress Notes (Signed)
GYNECOLOGY ANNUAL PHYSICAL EXAM PROGRESS NOTE  Subjective:    Christine Rice is a 37 y.o. G51P2012 female who presents for an annual exam.  The patient is sexually active.  The patient wears seatbelts: yes. The patient participates in regular exercise: no. Has the patient ever been transfused or tattooed?: no. The patient reports that there is not domestic violence in her life.   The patient has the following complaints today. 1. Fatigue - has been going on for "some time now".  Now taking supplements that help.  2. Urine is malodorous. Notes that she does not drink much water, drinks more coffee.  Has been ongoing for quite some time. Denies dysuria, hematuria.   Gynecologic History No LMP recorded (lmp unknown). Patient is not currently having periods (Reason: IUD).  Menarche age: 44 Contraception: IUD History of STI's: Denies Last Pap: 09/2015. Results were: normal.  Denies h/o abnormal pap smears.    Obstetric History   G3   P2   T2   P0   A1   L2    SAB0   TAB1   Ectopic0   Multiple0   Live Births2     # Outcome Date GA Lbr Len/2nd Weight Sex Delivery Anes PTL Lv  3 Term 01/19/12 [redacted]w[redacted]d  6 lb 9.6 oz (2.995 kg) F CS-LTranv EPI  LIV     Name: Aguila,GIRL Janda     Apgar1:  9                Apgar5: 9  2 Term 01/06/05    F CS-Unspec   LIV  1 TAB               Past Medical History:  Diagnosis Date  . Anxiety     Past Surgical History:  Procedure Laterality Date  . CESAREAN SECTION  01/06/05  . CESAREAN SECTION  01/19/2012   Procedure: CESAREAN SECTION;  Surgeon: Luz Lex, MD;  Location: [facility];  Service: Gynecology;  Laterality: N/A;  repeat  . HERNIA REPAIR    . INGUINAL HERNIA REPAIR  01/19/2012   Procedure: HERNIA REPAIR INGUINAL ADULT BILATERAL;  Surgeon: Adin Hector, MD;  Location: [name];  Service: General;  Laterality: Bilateral;  . tummy tuck    . UMBILICAL HERNIA REPAIR  01/19/2012   Procedure: HERNIA REPAIR UMBILICAL ADULT;  Surgeon: Adin Hector,  MD;  Location: [facility];  Service: General;  Laterality: N/A;  . UMBILICAL HERNIA REPAIR  11-19-14    Family History  Problem Relation Age of Onset  . Breast cancer Neg Hx   . Cancer Neg Hx   . Stroke Neg Hx     Social History   Socioeconomic History  . Marital status: Divorced    Spouse name: Not on file  . Number of children: 2  . Years of education: Not on file  . Highest education level: Not on file  Social Needs  . Financial resource strain: Not on file  . Food insecurity - worry: Not on file  . Food insecurity - inability: Not on file  . Transportation needs - medical: Not on file  . Transportation needs - non-medical: Not on file  Occupational History  . Occupation: Product manager: [employer]  . Occupation: Catering--part time  Tobacco Use  . Smoking status: Current Every Day Smoker    Packs/day: 0.10    Types: Cigarettes  . Smokeless tobacco: Never Used  Substance and Sexual  Activity  . Alcohol use: No    Alcohol/week: 0.0 oz    Frequency: Never  . Drug use: Yes    Types: Marijuana    Comment: occ- marijuana   . Sexual activity: Yes    Partners: Male    Birth control/protection: IUD  Other Topics Concern  . Not on file  Social History Narrative  . Not on file    Current Outpatient Medications on File Prior to Visit  Medication Sig Dispense Refill  . levonorgestrel (MIRENA) 20 MCG/24HR IUD 1 each by Intrauterine route once.    . Multiple Vitamin (MULTIVITAMIN) tablet Take 2 tablets by mouth 2 (two) times daily.     No current facility-administered medications on file prior to visit.     Allergies  Allergen Reactions  . Amoxicillin Hives    REACTION: rash     Review of Systems Constitutional: negative for chills, fevers and sweats.  Positive for  fatigue Eyes: negative for irritation, redness and visual disturbance Ears, nose, mouth, throat, and face: negative for hearing loss, nasal congestion, snoring and  tinnitus Respiratory: negative for asthma, cough, sputum Cardiovascular: negative for chest pain, dyspnea, exertional chest pressure/discomfort, irregular heart beat, palpitations and syncope Gastrointestinal: negative for abdominal pain, change in bowel habits, nausea and vomiting Genitourinary: negative for abnormal menstrual periods, genital lesions, sexual problems and vaginal discharge, dysuria and urinary incontinence. Positive for malodorous urine.  Integument/breast: negative for breast lump, breast tenderness and nipple discharge Hematologic/lymphatic: negative for bleeding and easy bruising Musculoskeletal:negative for back pain and muscle weakness Neurological: negative for dizziness, headaches, vertigo and weakness Endocrine: negative for diabetic symptoms including polydipsia, polyuria and skin dryness Allergic/Immunologic: negative for hay fever and urticaria        Objective:  Blood pressure 119/79, pulse 89, height 5\' 6"  (1.676 m), weight 146 lb 14.4 oz (66.6 kg). Body mass index is 23.71 kg/m.  General Appearance:    Alert, cooperative, no distress, appears stated age  Head:    Normocephalic, without obvious abnormality, atraumatic  Eyes:    PERRL, conjunctiva/corneas clear, EOM's intact, both eyes  Ears:    Normal external ear canals, both ears  Nose:   Nares normal, septum midline, mucosa normal, no drainage or sinus tenderness  Throat:   Lips, mucosa, and tongue normal; teeth and gums normal  Neck:   Supple, symmetrical, trachea midline, no adenopathy; thyroid: no enlargement/tenderness/nodules; no carotid bruit or JVD  Back:     Symmetric, no curvature, ROM normal, no CVA tenderness  Lungs:     Clear to auscultation bilaterally, respirations unlabored  Chest Wall:    No tenderness or deformity   Heart:    Regular rate and rhythm, S1 and S2 normal, no murmur, rub or gallop  Breast Exam:    No tenderness, masses, or nipple abnormality  Abdomen:     Soft, non-tender,  bowel sounds active all four quadrants, no masses, no organomegaly.  Lower transabdominal incision well healed.   Genitalia:    Pelvic:external genitalia normal, vagina without lesions, discharge, or tenderness, rectovaginal septum  normal. Cervix normal in appearance, no cervical motion tenderness.  IUD threads visible from cervical os, 3 cm in length.  No adnexal masses or tenderness.  Uterus normal size, shape, mobile, regular contours, nontender.  Rectal:    Normal external sphincter.  No hemorrhoids appreciated. Internal exam not done.   Extremities:   Extremities normal, atraumatic, no cyanosis or edema  Pulses:   2+ and symmetric all extremities  Skin:  Skin color, texture, turgor normal, no rashes or lesions  Lymph nodes:   Cervical, supraclavicular, and axillary nodes normal  Neurologic:   CNII-XII intact, normal strength, sensation and reflexes throughout   .  Labs:  Lab Results  Component Value Date   WBC 12.1 (H) 07/05/2016   HGB 15.3 (H) 07/05/2016   HCT 44.7 07/05/2016   MCV 91.7 07/05/2016   PLT 221.0 07/05/2016    Lab Results  Component Value Date   CREATININE 0.72 07/05/2016   BUN 7 07/05/2016   NA 138 07/05/2016   K 4.2 07/05/2016   CL 109 07/05/2016   CO2 30 07/05/2016    Lab Results  Component Value Date   ALT 13 07/05/2016   AST 18 07/05/2016   ALKPHOS 41 07/05/2016   BILITOT 0.4 07/05/2016    Lab Results  Component Value Date   CHOL 160 07/05/2016   HDL 39.90 07/05/2016   LDLCALC 107 (H) 07/05/2016   TRIG 69.0 07/05/2016   CHOLHDL 4 07/05/2016    Lab Results  Component Value Date   TSH 0.60 07/05/2016    Results for orders placed or performed in visit on 02/16/18  POCT urinalysis dipstick  Result Value Ref Range   Color, UA yellow    Clarity, UA clear    Glucose, UA neg    Bilirubin, UA neg    Ketones, UA neg    Spec Grav, UA 1.025 1.010 - 1.025   Blood, UA non hemolyzed mod.    pH, UA 6.5 5.0 - 8.0   Protein, UA neg     Urobilinogen, UA 0.2 0.2 or 1.0 E.U./dL   Nitrite, UA neg    Leukocytes, UA Negative Negative   Appearance yellow    Odor      Assessment:    Healthy female exam.   Fatigue  Malodorous urine Cervical cancer screening IUD in place  Plan:     Blood tests: CBC with diff, Comprehensive metabolic panel and Vitamin D level. Breast self exam technique reviewed and patient encouraged to perform self-exam monthly. Contraception: IUD. Discussed healthy lifestyle modifications. Pap smear performed today.  Declines flu vaccine Malodorous urine with negative UA. May be dietary related (as patient notes she does drink little water, mostly coffee, does not eat many veggies, and is taking a multivitamin supplement). Encouraged on healthier dietary intake and water intake.  Fatigue, unknown cause, however patient does note working more than 1 job, and also cites unbalanced diet.  Will check Vit D.  Has had normal TSH in the past. Discussed taking time to rest.  Discussed Gardasil vaccine.     Rubie Maid, MD [facility]

## 2018-02-16 NOTE — Progress Notes (Signed)
Pt is very tired and taking multi vits to help with it. Urine smells bad. Last 2 days had sharp pain in he chest once it went across her entire chest.

## 2018-02-17 LAB — COMPREHENSIVE METABOLIC PANEL
A/G RATIO: 1.7 (ref 1.2–2.2)
ALBUMIN: 4.3 g/dL (ref 3.5–5.5)
ALT: 15 IU/L (ref 0–32)
AST: 15 IU/L (ref 0–40)
Alkaline Phosphatase: 57 IU/L (ref 39–117)
BUN/Creatinine Ratio: 11 (ref 9–23)
BUN: 8 mg/dL (ref 6–20)
Bilirubin Total: 0.2 mg/dL (ref 0.0–1.2)
CALCIUM: 9.3 mg/dL (ref 8.7–10.2)
CO2: 24 mmol/L (ref 20–29)
CREATININE: 0.71 mg/dL (ref 0.57–1.00)
Chloride: 103 mmol/L (ref 96–106)
GFR calc Af Amer: 127 mL/min/{1.73_m2} (ref >59)
GFR, EST NON AFRICAN AMERICAN: 110 mL/min/{1.73_m2} (ref >59)
GLOBULIN, TOTAL: 2.5 g/dL (ref 1.5–4.5)
Glucose: 69 mg/dL (ref 65–99)
POTASSIUM: 4.3 mmol/L (ref 3.5–5.2)
SODIUM: 141 mmol/L (ref 134–144)
Total Protein: 6.8 g/dL (ref 6.0–8.5)

## 2018-02-17 LAB — CBC
Hematocrit: 43.4 % (ref 34.0–46.6)
Hemoglobin: 14.4 g/dL (ref 11.1–15.9)
MCH: 31.1 pg (ref 26.6–33.0)
MCHC: 33.2 g/dL (ref 31.5–35.7)
MCV: 94 fL (ref 79–97)
PLATELETS: 226 10*3/uL (ref 150–379)
RBC: 4.63 x10E6/uL (ref 3.77–5.28)
RDW: 13.2 % (ref 12.3–15.4)
WBC: 11.1 10*3/uL — ABNORMAL HIGH (ref 3.4–10.8)

## 2018-02-17 LAB — VITAMIN D 25 HYDROXY (VIT D DEFICIENCY, FRACTURES): VIT D 25 HYDROXY: 35.4 ng/mL (ref 30.0–100.0)

## 2018-02-19 ENCOUNTER — Telehealth (HOSPITAL_BASED_OUTPATIENT_CLINIC_OR_DEPARTMENT_OTHER): Payer: Self-pay

## 2018-02-19 NOTE — Telephone Encounter (Signed)
Pt returned my calls from last week.  Said things are ok-- at dental appt during this call.  Agreed to call me back when done to r/s missed visit w/ Dr. Shaune LeeksAbeles.  Explained that she was going to be terminated from [employer] subsidy program d/t failure to pay rent.  She understood.  Also referred her to [facility] explaining [facility]  [facility] following her dental appt.

## 2018-02-20 NOTE — Telephone Encounter (Signed)
PHARMACY:    DATE OF LAST REFILL: Prescription was discontinued on 10/05/2017 by Renford Dills, MD for the following reason: Alternate therapy.    HIV Serology Latest Ref Rng & Units 10/05/2017 09/29/2016 01/19/2016 06/23/2015 04/21/2015   CD4 Count 250 - 1,200 cells/uL 818 621 515 -- 468   CD4 Count - [facility] - -- -- -- -- --   CD4 Count - [facility] - -- -- -- -- --   CD4 Count - External - -- -- -- -- --   CD4 % 29 - 61 % 44 41 41 -- 36   CD4 % - [facility] - -- -- -- -- --   CD4 % - [facility] - -- -- -- -- --   CD4 % - External - -- -- -- -- --   CD8 Count 100 - 800 cells/uL 702 529 504 -- 539   CD8 Count - [facility] - -- -- -- -- --   CD8 Count - [facility] - -- -- -- -- --   CD8 Count - External - -- -- -- -- --   CD8 % 11 - 38 % 37 35 40(H) -- 41(H)   CD8 % - [facility] - -- -- -- -- --   CD8 % - External - -- -- -- -- --   CD4:CD8 Ratio 0.70 - 4.00 1.16 1.17 1.02 -- 0.87   CD4:CD8 Ratio - [facility] - -- -- -- -- --   CD4:CD8 Ratio - [facility] - -- -- -- -- --   CD4:CD8 Ratio - External - -- -- -- -- --   bDNA - -- -- -- -- --   RNA-PCR Ultra See Comment copies/mL 62(A) Not Detected Not Detected Not Detected Not Detected   RNA-PCR Ultra - External - -- -- -- -- --   RNA-PCR Standard - -- -- -- -- --   RNA-PCR - [facility] - -- -- -- -- --   RNA-PCR - [facility] - -- -- -- -- --   HIV Ab  - -- -- -- -- --   HIV RNA - -- -- -- -- --   RNA - PCR Abbott RT - -- -- -- -- --   RNA - PCR Abbot RT - External - -- -- -- -- --   TaqMan HIV-1 RT-PCR - -- -- -- -- --   Some recent data might be hidden     No results found for: AMPCLASS, BARBCLASS, BENZYLCGNN, BENZDIAZCL, METHADONE, OPIATESCL, OXY, PHENCYCLDN, TETCANNABIN, UDI  No results found for: 6AM, CODEINE, MOR, FEN, HCOD, HMOR, NFEN, EDDPU, METHD, OXYC, OXYM, OPINT   No results found for: 7AMCL, AHYMI, AHYTR, AHYAL, 2HYEF, LORA, OXAZE, DESAL, TEMA, NORDI, BEINT  Lab Results   Component Value Date    BUN 9 10/05/2017    CREAT 0.65 10/05/2017    CL 103 10/05/2017    NA 139 10/05/2017    K 4.3 10/05/2017    [place] 9.0 10/05/2017    TBILI 0.45 10/05/2017    ALB 4.4 10/05/2017    TP 7.3 10/05/2017    AST 15 10/05/2017    ALK 52 10/05/2017    BICARB 22 10/05/2017    ALT 13 10/05/2017    GLU 83 10/05/2017     No results found for: A1C  Lab Results   Component Value Date    WBC 6.0 10/05/2017    RBC 4.72 10/05/2017    HGB 14.0 10/05/2017    HCT 41.4 10/05/2017    MCV 87.7 10/05/2017  MCHC 33.8 10/05/2017    RDW 12.5 10/05/2017    PLT 183 10/05/2017    MPV 11.4 10/05/2017     Lab Results   Component Value Date    CHOL 165 12/16/2014    HDL 59 12/16/2014    LDLCALC 78 12/16/2014    TRIG 140 12/16/2014     No results found for: RPR  Lab Results   Component Value Date    COLORUA Yellow 05/25/2017    APPEARUA Hazy 05/25/2017    GLUCOSEUA Negative 05/25/2017    BILIUA Negative 05/25/2017    KETONEUA Negative 05/25/2017    SGUA 1.031 (H) 05/25/2017    BLOODUA 1+ (A) 05/25/2017    PHUA 5.0 05/25/2017    PROTEINUA 1+ (A) 05/25/2017    UROBILUA Negative 05/25/2017    NITRITEUA Negative 05/25/2017    LEUKESTUA 3+ (A) 05/25/2017    WBCUA >50 (A) 05/25/2017    RBCUA 6-10 (A) 05/25/2017     No results found for: PSA  Vitamin D, 25-OH D2 (ng/mL)   Date Value   01/19/2016 <5     Vitamin D, 25-OH D3 (ng/mL)   Date Value   01/19/2016 25     Vitamin D, 25-OH TOTAL (ng/mL)   Date Value   01/19/2016 25

## 2018-02-20 NOTE — Telephone Encounter (Signed)
On Biktarvy

## 2018-02-21 ENCOUNTER — Telehealth (HOSPITAL_BASED_OUTPATIENT_CLINIC_OR_DEPARTMENT_OTHER): Payer: Self-pay

## 2018-02-21 LAB — IGP, COBASHPV16/18
HPV 16: POSITIVE — AB
HPV 18: NEGATIVE
HPV other hr types: NEGATIVE
PAP SMEAR COMMENT: 0

## 2018-02-21 NOTE — Telephone Encounter (Signed)
[facility]  Went over documentation needed from pt.      Called pt to f-u on above.  In jeopardy of getting evicted as she owes back rent and not able to pay d/t lack of income.  Applied for SS disability but feeling discouraged b/c not sure how supportive her medical provider will be.  Processed and reframed for pt.  Agreed to meet F/F tomorrow at 11:30.  Needs to schedule f-u w/ Dr. Shaune LeeksAbeles.

## 2018-02-21 NOTE — Progress Notes (Signed)
Please contact patient regarding her abnormal pap smear (HPV+), and have her schedule a colposcopy

## 2018-03-13 ENCOUNTER — Telehealth (HOSPITAL_BASED_OUTPATIENT_CLINIC_OR_DEPARTMENT_OTHER): Payer: Self-pay

## 2018-03-13 NOTE — Telephone Encounter (Signed)
T/C: SW called PT to [facility]. [facility] who has been trying to reach PT for missing paper work   SW left VM w/call back number.

## 2018-03-19 ENCOUNTER — Telehealth (HOSPITAL_BASED_OUTPATIENT_CLINIC_OR_DEPARTMENT_OTHER): Payer: Self-pay

## 2018-04-13 NOTE — Telephone Encounter (Signed)
Left VM for pt to re-schedule appt w/ Dr. Shaune Leeks and to f-u on [employer] referral for housing.

## 2018-04-18 ENCOUNTER — Ambulatory Visit (HOSPITAL_BASED_OUTPATIENT_CLINIC_OR_DEPARTMENT_OTHER): Payer: Medicaid Other | Admitting: Psychiatry

## 2018-04-30 ENCOUNTER — Encounter (HOSPITAL_BASED_OUTPATIENT_CLINIC_OR_DEPARTMENT_OTHER): Payer: Self-pay

## 2018-05-17 ENCOUNTER — Telehealth (HOSPITAL_BASED_OUTPATIENT_CLINIC_OR_DEPARTMENT_OTHER): Payer: Self-pay

## 2018-05-17 NOTE — Telephone Encounter (Signed)
Attempted phone f/u.  Left message on her her cel VM.

## 2018-08-01 ENCOUNTER — Telehealth (HOSPITAL_BASED_OUTPATIENT_CLINIC_OR_DEPARTMENT_OTHER): Payer: Self-pay

## 2018-08-01 NOTE — Telephone Encounter (Signed)
Attempted T/C, no answer.  Pt has not been seen for HIV care since 2/19.  I have not been able to contact this pt.  Will disenroll from CM services for now.  Pt can call [facility] [phone] directly if she would like to resume CM/ SW services.    Etter SjogrenMaria Saintclair Schroader, [place]  [phone]

## 2018-09-03 ENCOUNTER — Telehealth: Payer: Self-pay

## 2018-09-03 NOTE — Telephone Encounter (Signed)
Copied from [place] [id number]. Topic: General - Other >> Sep 03, 2018 12:51 PM Ivar Drape wrote: Reason for CRM:   Patient needs to know when she had her last Tetanus shot.  Please call her at [phone]. >> Sep 03, 2018  2:55 PM Helene Shoe, LPN wrote: Pt advised Tdap given 07/14/2014. Pt voiced understanding; pt found that on my chart. Pt asked if any other immunizations needed. Advised it is time of yr to get flu shot and pt will speak with Dr tower at next visit if she wants her to have any other vaccines.

## 2019-01-23 ENCOUNTER — Emergency Department
Admission: EM | Admit: 2019-01-23 | Discharge: 2019-01-23 | Disposition: A | Discharge status: Home / Self Care | Payer: Self-pay | Attending: Emergency Medicine | Admitting: Emergency Medicine

## 2019-01-23 ENCOUNTER — Encounter: Payer: Self-pay | Admitting: Emergency Medicine

## 2019-01-23 ENCOUNTER — Other Ambulatory Visit: Payer: Self-pay

## 2019-01-23 DIAGNOSIS — F17210 Nicotine dependence, cigarettes, uncomplicated: Secondary | ICD-10-CM | POA: Insufficient documentation

## 2019-01-23 DIAGNOSIS — R112 Nausea with vomiting, unspecified: Secondary | ICD-10-CM | POA: Insufficient documentation

## 2019-01-23 DIAGNOSIS — Z79899 Other long term (current) drug therapy: Secondary | ICD-10-CM | POA: Insufficient documentation

## 2019-01-23 DIAGNOSIS — R197 Diarrhea, unspecified: Secondary | ICD-10-CM | POA: Insufficient documentation

## 2019-01-23 DIAGNOSIS — R109 Unspecified abdominal pain: Secondary | ICD-10-CM | POA: Insufficient documentation

## 2019-01-23 LAB — CBC
HCT: 44.9 % (ref 36.0–46.0)
Hemoglobin: 15.7 g/dL — ABNORMAL HIGH (ref 12.0–15.0)
MCH: 31.3 pg (ref 26.0–34.0)
MCHC: 35 g/dL (ref 30.0–36.0)
MCV: 89.4 fL (ref 80.0–100.0)
Platelets: 256 10*3/uL (ref 150–400)
RBC: 5.02 MIL/uL (ref 3.87–5.11)
RDW: 12.3 % (ref 11.5–15.5)
WBC: 7.4 10*3/uL (ref 4.0–10.5)
nRBC: 0 % (ref 0.0–0.2)

## 2019-01-23 LAB — COMPREHENSIVE METABOLIC PANEL
ALT: 12 U/L (ref 0–44)
AST: 15 U/L (ref 15–41)
Albumin: 4 g/dL (ref 3.5–5.0)
Alkaline Phosphatase: 43 U/L (ref 38–126)
Anion gap: 8 (ref 5–15)
BUN: 6 mg/dL (ref 6–20)
CO2: 24 mmol/L (ref 22–32)
Calcium: 9 mg/dL (ref 8.9–10.3)
Chloride: 105 mmol/L (ref 98–111)
Creatinine, Ser: 0.55 mg/dL (ref 0.44–1.00)
GFR calc Af Amer: >60 mL/min (ref >60)
GFR calc non Af Amer: >60 mL/min (ref >60)
Glucose, Bld: 91 mg/dL (ref 70–99)
Potassium: 3.8 mmol/L (ref 3.5–5.1)
Sodium: 137 mmol/L (ref 135–145)
TOTAL PROTEIN: 7.2 g/dL (ref 6.5–8.1)
Total Bilirubin: 1 mg/dL (ref 0.3–1.2)

## 2019-01-23 LAB — URINALYSIS, COMPLETE (UACMP) WITH MICROSCOPIC
Bacteria, UA: NONE SEEN
Bilirubin Urine: NEGATIVE
Glucose, UA: NEGATIVE mg/dL
Hgb urine dipstick: NEGATIVE
Ketones, ur: NEGATIVE mg/dL
Leukocytes,Ua: NEGATIVE
Nitrite: NEGATIVE
PROTEIN: NEGATIVE mg/dL
Specific Gravity, Urine: 1.012 (ref 1.005–1.030)
pH: 7 (ref 5.0–8.0)

## 2019-01-23 LAB — LIPASE, BLOOD: Lipase: 27 U/L (ref 11–51)

## 2019-01-23 MED ORDER — ONDANSETRON HCL 4 MG/2ML IJ SOLN
4.0000 mg | Freq: Once | INTRAMUSCULAR | Status: AC
  Administered 2019-01-23: 4 mg via INTRAVENOUS
  Filled 2019-01-23: qty 2

## 2019-01-23 MED ORDER — ONDANSETRON HCL 4 MG PO TABS: 4.0000 mg | ORAL_TABLET | Freq: Three times a day (TID) | ORAL | 0 refills | Status: DC | PRN

## 2019-01-23 MED ORDER — SODIUM CHLORIDE 0.9 % IV BOLUS
1000.0000 mL | Freq: Once | INTRAVENOUS | Status: AC
  Administered 2019-01-23: 1000 mL via INTRAVENOUS

## 2019-01-23 NOTE — ED Provider Notes (Addendum)
[facility] Emergency Department Provider Note  ____________________________________________   I have reviewed the triage vital signs and the nursing notes. Where available I have reviewed prior notes and, if possible and indicated, outside hospital notes.    HISTORY  Chief Complaint Abdominal Pain    HPI Christine Rice is a 38 y.o. female With a history of a "tummy tuck" surgery and prior surgeries for hernias presents today complaining of nausea vomiting diarrhea.  The nausea and vomiting started over the weekend but is now gone, the diarrhea is nearly gone.  She has had no melena no bright red blood per rectum no fever no chills, she states that she is feeling much better at this time but she wants to make sure that everything is "okay".  She denies any headache or stiff neck change in vision or neurologic deficit.  She denies any chest pain or shortness of breath.  She denies any focal abdominal pain but she still has abdominal cramping which feels like "gas".  She states it moves all around and is relieved by bowel movements.  Had a total of 2 diarrhea stools this morning.  No melena no bright red blood per rectum Positive sick contacts.     Past Medical History:  Diagnosis Date  . Anxiety     Patient Active Problem List   Diagnosis Date Noted  . Smoking 07/14/2014  . Routine general medical examination at a health care facility 07/07/2014  . Umbilical hernia 80/02/4916  . Cesarean delivery delivered 01/19/2012  . IBS 08/15/2007  . 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 ANXIETY 08/14/2007  . DEPRESSION 08/14/2007  . ADD 08/14/2007    Past Surgical History:  Procedure Laterality Date  . CESAREAN SECTION  01/06/05  . CESAREAN SECTION  01/19/2012   Procedure: CESAREAN SECTION;  Surgeon: Luz Lex, MD;  Location: [facility];  Service: Gynecology;  Laterality: N/A;  repeat  . HERNIA REPAIR    . INGUINAL HERNIA REPAIR  01/19/2012   Procedure: HERNIA REPAIR INGUINAL ADULT  BILATERAL;  Surgeon: Adin Hector, MD;  Location: [facility];  Service: General;  Laterality: Bilateral;  . tummy tuck    . UMBILICAL HERNIA REPAIR  01/19/2012   Procedure: HERNIA REPAIR UMBILICAL ADULT;  Surgeon: Adin Hector, MD;  Location: [facility];  Service: General;  Laterality: N/A;  . UMBILICAL HERNIA REPAIR  11-19-14    Prior to Admission medications   Medication Sig Start Date End Date Taking? Authorizing Provider  levonorgestrel (MIRENA) 20 MCG/24HR IUD 1 each by Intrauterine route once.    [provider]  Multiple Vitamin (MULTIVITAMIN) tablet Take 2 tablets by mouth 2 (two) times daily.    [provider]    Allergies Amoxicillin  Family History  Problem Relation Age of Onset  . Breast cancer Neg Hx   . Cancer Neg Hx   . Stroke Neg Hx     Social History Social History   Tobacco Use  . Smoking status: Current Every Day Smoker    Packs/day: 0.10    Types: Cigarettes  . Smokeless tobacco: Never Used  Substance Use Topics  . Alcohol use: No    Alcohol/week: 0.0 standard drinks    Frequency: Never  . Drug use: Yes    Types: Marijuana    Comment: occ- marijuana     Review of Systems Constitutional: No fever/chills Eyes: No visual changes. ENT: No sore throat. No stiff neck no neck pain Cardiovascular: Denies chest pain. Respiratory: Denies  shortness of breath. Gastrointestinal: see hpi Genitourinary: Negative for dysuria. Musculoskeletal: Negative lower extremity swelling Skin: Negative for rash. Neurological: Negative for severe headaches, focal weakness or numbness.   ____________________________________________   PHYSICAL EXAM:  VITAL SIGNS: ED Triage Vitals [01/23/19 1118]  Enc Vitals Group     BP 121/86     Pulse Rate 81     Resp 16     Temp 98.2 F (36.8 C)     Temp Source Oral     SpO2 100 %     Weight 144 lb (65.3 kg)     Height 5\' 6"  (1.676 m)     Head Circumference      Peak Flow      Pain Score 4     Pain Loc       Pain Edu?      Excl. in [place]?     Constitutional: Alert and oriented. Well appearing and in no acute distress. Eyes: Conjunctivae are normal Head: Atraumatic HEENT: No congestion/rhinnorhea. Mucous membranes are moist.  Oropharynx non-erythematous Neck:   Nontender with no meningismus, no masses, no stridor Cardiovascular: Normal rate, regular rhythm. Grossly normal heart sounds.  Good peripheral circulation. Respiratory: Normal respiratory effort.  No retractions. Lungs CTAB. Abdominal: Soft and nontender. No distention. No guarding no rebound Back:  There is no focal tenderness or step off.  there is no midline tenderness there are no lesions noted. there is no CVA tenderness  Musculoskeletal: No lower extremity tenderness, no upper extremity tenderness. No joint effusions, no DVT signs strong distal pulses no edema Neurologic:  Normal speech and language. No gross focal neurologic deficits are appreciated.  Skin:  Skin is warm, dry and intact. No rash noted. Psychiatric: Mood and affect are normal. Speech and behavior are normal.  ____________________________________________   LABS (all labs ordered are listed, but only abnormal results are displayed)  Labs Reviewed  CBC - Abnormal; Notable for the following components:      Result Value   Hemoglobin 15.7 (*)    All other components within normal limits  URINALYSIS, COMPLETE (UACMP) WITH MICROSCOPIC - Abnormal; Notable for the following components:   Color, Urine YELLOW (*)    APPearance CLEAR (*)    All other components within normal limits  LIPASE, BLOOD  COMPREHENSIVE METABOLIC PANEL  POC URINE PREG, ED    Pertinent labs  results that were available during my care of the patient were reviewed by me and considered in my medical decision making (see chart for details). ____________________________________________  EKG  I personally interpreted any EKGs ordered by me or  triage  ____________________________________________  RADIOLOGY  Pertinent labs & imaging results that were available during my care of the patient were reviewed by me and considered in my medical decision making (see chart for details). If possible, patient and/or family made aware of any abnormal findings.  No results found. ____________________________________________    PROCEDURES  Procedure(s) performed: None  Procedures  Critical Care performed: None  ____________________________________________   INITIAL IMPRESSION / ASSESSMENT AND PLAN / ED COURSE  Pertinent labs & imaging results that were available during my care of the patient were reviewed by me and considered in my medical decision making (see chart for details).  Markedly well-appearing woman she does have some evidence of possible hemoconcentration with a elevated hemoglobin otherwise exam and blood work are quite reassuring nothing to suggest any significant intra-abdominal pathology at this time we will give her IV fluid make sure she feels  better.  Does appear to be resolving GI syndrome.  Happened after she ate some eggs which were left lying around for several hours.   ----------------------------------------- 4:45 PM on 01/23/2019 -----------------------------------------  Considering the patient's symptoms, medical history, and physical examination today, I have low suspicion for cholecystitis or biliary pathology, pancreatitis, perforation or bowel obstruction, hernia, intra-abdominal abscess, AAA or dissection, volvulus or intussusception, mesenteric ischemia, ischemic gut, pyelonephritis or appendicitis.   ____________________________________________   FINAL CLINICAL IMPRESSION(S) / ED DIAGNOSES  Final diagnoses:  None      This chart was dictated using voice recognition software.  Despite best efforts to proofread,  errors can occur which can change meaning.      Schuyler Amor,  MD 01/23/19 1555    Schuyler Amor, MD 01/23/19 [phone]

## 2019-01-23 NOTE — ED Notes (Signed)
NAD noted at time of D/C. Pt denies questions or concerns. Pt ambulatory to the lobby at this time.  

## 2019-01-23 NOTE — ED Triage Notes (Signed)
Pt to ED from home c/o abd pain since Sunday morning, one episode of vomiting Sunday, diarrhea x3 today.  States unsure if what she ate on Saturday, had eggs that had been sitting out for several hours.  Denies GU symptoms.

## 2019-01-28 ENCOUNTER — Telehealth: Payer: Self-pay

## 2019-01-28 NOTE — Telephone Encounter (Signed)
Pt called requesting refill xanax. Advised pt would need to be seen. Pt last seen 07/11/16 annual. Pt scheduled CPX 02/15/19 and pt is not out of med. Nothing further needed.

## 2019-02-15 ENCOUNTER — Ambulatory Visit (INDEPENDENT_AMBULATORY_CARE_PROVIDER_SITE_OTHER): Payer: Self-pay | Admitting: Family Medicine

## 2019-02-15 ENCOUNTER — Encounter: Payer: Self-pay | Admitting: Family Medicine

## 2019-02-15 VITALS — BP 116/60 | HR 74 | Temp 97.8°F | Ht 65.5 in | Wt 139.3 lb

## 2019-02-15 DIAGNOSIS — F17200 Nicotine dependence, unspecified, uncomplicated: Secondary | ICD-10-CM

## 2019-02-15 DIAGNOSIS — F413 Other mixed anxiety disorders: Secondary | ICD-10-CM

## 2019-02-15 MED ORDER — ALPRAZOLAM 0.25 MG PO TABS: 0.2500 mg | ORAL_TABLET | Freq: Every day | ORAL | 0 refills | Status: DC | PRN

## 2019-02-15 NOTE — Patient Instructions (Addendum)
You should be getting a flu shot every year and a pneumonia vaccine every 5-7 years Get them at the health department   I refilled your xanax  If you find you need it frequently -we can prescribe some more regular medication , please let me know   Please think about quitting smoking  Nicotine gum or patches may help

## 2019-02-15 NOTE — Progress Notes (Signed)
Subjective:    Patient ID: Christine Rice, female    DOB: November 20, 1981, 38 y.o.   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) She gets family planning for medicare  Has   Smoker - about 1/2 ppd or a little more  She does want to quit but she is scared to  vaped in the past  Has not used gum or patches before  Has a morning cough      Had some labs    Chemistry      Component Value Date/Time   NA 137 01/23/2019 1120   NA 141 02/16/2018 1559   K 3.8 01/23/2019 1120   CL 105 01/23/2019 1120   CO2 24 01/23/2019 1120   BUN 6 01/23/2019 1120   BUN 8 02/16/2018 1559   CREATININE 0.55 01/23/2019 1120      Component Value Date/Time   CALCIUM 9.0 01/23/2019 1120   ALKPHOS 43 01/23/2019 1120   AST 15 01/23/2019 1120   ALT 12 01/23/2019 1120   BILITOT 1.0 01/23/2019 1120   BILITOT 0.2 02/16/2018 1559     Lab Results  Component Value Date   WBC 7.4 01/23/2019   HGB 15.7 (H) 01/23/2019   HCT 44.9 01/23/2019   MCV 89.4 01/23/2019   PLT 256 01/23/2019   Glucose 91   This was all in feb   Xanax - 90 pills lasted 3 years (takes 30 per year) She takes 1/2 when needed for panic attacks   Patient Active Problem List   Diagnosis Date Noted  . Smoking 07/14/2014  . Routine general medical examination at a health care facility 07/07/2014  . Umbilical hernia 46/56/8127  . Cesarean delivery delivered 01/19/2012  . IBS 08/15/2007  . 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 Anxiety disorder 08/14/2007  . DEPRESSION 08/14/2007  . ADD 08/14/2007   Past Medical History:  Diagnosis Date  . Anxiety     Past Surgical History:  Procedure Laterality Date  . CESAREAN SECTION  01/06/05  . CESAREAN SECTION  01/19/2012   Procedure: CESAREAN SECTION;  Surgeon: Luz Lex, MD;  Location: [facility];  Service: Gynecology;  Laterality: N/A;  repeat  . HERNIA REPAIR    . INGUINAL HERNIA REPAIR  01/19/2012   Procedure: HERNIA REPAIR INGUINAL ADULT BILATERAL;  Surgeon: Adin Hector, MD;  Location: [facility];  Service: General;  Laterality: Bilateral;  . tummy tuck    . UMBILICAL HERNIA REPAIR  01/19/2012   Procedure: HERNIA REPAIR UMBILICAL ADULT;  Surgeon: Adin Hector, MD;  Location: [facility];  Service: General;  Laterality: N/A;  . UMBILICAL HERNIA REPAIR  11-19-14   Social History   Tobacco Use  . Smoking status: Current Every Day Smoker    Packs/day: 0.10    Types: Cigarettes  . Smokeless tobacco: Never Used  Substance Use Topics  . Alcohol use: No    Alcohol/week: 0.0 standard drinks    Frequency: Never  . Drug use:  Yes    Types: Marijuana    Comment: occ- marijuana    Family History  Problem Relation Age of Onset  . Breast cancer Neg Hx   . Cancer Neg Hx   . Stroke Neg Hx    Allergies  Allergen Reactions  . Amoxicillin Hives    REACTION: rash  . Penicillins Hives   Current Outpatient Medications on File Prior to Visit  Medication Sig Dispense Refill  . levonorgestrel (MIRENA) 20 MCG/24HR IUD 1 each by Intrauterine route once.    . Multiple Vitamin (MULTIVITAMIN) tablet Take 2 tablets by mouth 2 (two) times daily.    . ondansetron (ZOFRAN) 4 MG tablet Take 1 tablet (4 mg total) by mouth every 8 (eight) hours as needed for nausea or vomiting. 8 tablet 0   No current facility-administered medications on file prior to visit.     Review of Systems  Constitutional: Negative for activity change, appetite change, fatigue, fever and unexpected weight change.  HENT: Negative for congestion, ear pain, rhinorrhea, sinus pressure and sore throat.   Eyes: Negative for pain, redness and  visual disturbance.  Respiratory: Negative for cough, shortness of breath and wheezing.   Cardiovascular: Negative for chest pain and palpitations.  Gastrointestinal: Negative for abdominal pain, blood in stool, constipation and diarrhea.  Endocrine: Negative for polydipsia and polyuria.  Genitourinary: Negative for dysuria, frequency and urgency.  Musculoskeletal: Negative for arthralgias, back pain and myalgias.  Skin: Negative for pallor and rash.  Allergic/Immunologic: Negative for environmental allergies.  Neurological: Negative for dizziness, syncope and headaches.  Hematological: Negative for adenopathy. Does not bruise/bleed easily.  Psychiatric/Behavioral: Negative for agitation, behavioral problems, confusion, decreased concentration, dysphoric mood, sleep disturbance and suicidal ideas. The patient is nervous/anxious.        Objective:   Physical Exam Constitutional:      General: She is not in acute distress.    Appearance: Normal appearance. She is normal weight. She is not ill-appearing.     Comments: slim  HENT:     Head: Normocephalic and atraumatic.     Mouth/Throat:     Mouth: Mucous membranes are moist.     Pharynx: Oropharynx is clear.  Eyes:     General: No scleral icterus.    Conjunctiva/sclera: Conjunctivae normal.     Pupils: Pupils are equal, round, and reactive to light.  Neck:     Musculoskeletal: Normal range of motion.     Vascular: No carotid bruit.  Cardiovascular:     Rate and Rhythm: Normal rate and regular rhythm.     Pulses: Normal pulses.     Heart sounds: Normal heart sounds.  Pulmonary:     Effort: Pulmonary effort is normal. No respiratory distress.     Breath sounds: Normal breath sounds. No wheezing or rales.     Comments: No wheezing  Abdominal:     General: Abdomen is flat. Bowel sounds are normal.     Palpations: Abdomen is soft.  Musculoskeletal:     Right lower leg: No edema.     Left lower leg: No edema.  Lymphadenopathy:      Cervical: No cervical adenopathy.  Skin:    General: Skin is warm and dry.     Coloration: Skin is not pale.     Findings: No rash.  Neurological:     General: No focal deficit present.     Mental Status: She is alert.     Motor: No tremor.     Coordination: Coordination  normal.     Deep Tendon Reflexes: Reflexes normal.  Psychiatric:        Mood and Affect: Mood normal. Mood is not anxious or depressed.        Speech: Speech normal.        Thought Content: Thought content normal.     Comments: Not anxious today             Assessment & Plan:   Problem List Items Addressed This Visit      Other   Anxiety disorder - Primary    Fairly stable  Uses a limited amount of xanax prn only for severe symptoms or panic attack that does not improve #30 given  She is self pay and cannot afford to come in more than every few years  Stays utd with her gyn for other problems  Reviewed stressors/ coping techniques/symptoms/ support sources/ tx options and side effects in detail today       Relevant Medications   ALPRAZolam (XANAX) 0.25 MG tablet   Smoking    Disc in detail risks of smoking and possible outcomes including copd, vascular/ heart disease, cancer , respiratory and sinus infections  Pt voices understanding She is not ready to quit

## 2019-02-17 NOTE — Assessment & Plan Note (Signed)
Disc in detail risks of smoking and possible outcomes including copd, vascular/ heart disease, cancer , respiratory and sinus infections  Pt voices understanding She is not ready to quit  

## 2019-02-17 NOTE — Assessment & Plan Note (Signed)
Fairly stable  Uses a limited amount of xanax prn only for severe symptoms or panic attack that does not improve #30 given  She is self pay and cannot afford to come in more than every few years  Stays utd with her gyn for other problems  Reviewed stressors/ coping techniques/symptoms/ support sources/ tx options and side effects in detail today

## 2019-02-26 ENCOUNTER — Encounter: Payer: Medicaid Other | Admitting: Obstetrics and Gynecology

## 2019-03-07 ENCOUNTER — Encounter: Payer: Medicaid Other | Admitting: Obstetrics and Gynecology

## 2019-04-03 ENCOUNTER — Encounter (INDEPENDENT_AMBULATORY_CARE_PROVIDER_SITE_OTHER): Payer: Self-pay

## 2019-05-14 ENCOUNTER — Encounter: Payer: Self-pay | Admitting: Obstetrics and Gynecology

## 2019-06-10 ENCOUNTER — Other Ambulatory Visit: Payer: Self-pay

## 2019-06-10 ENCOUNTER — Ambulatory Visit (INDEPENDENT_AMBULATORY_CARE_PROVIDER_SITE_OTHER): Payer: Self-pay | Admitting: Family Medicine

## 2019-06-10 ENCOUNTER — Encounter: Payer: Self-pay | Admitting: Family Medicine

## 2019-06-10 VITALS — BP 126/68 | HR 77 | Temp 98.2°F | Ht 65.5 in | Wt 149.1 lb

## 2019-06-10 DIAGNOSIS — L989 Disorder of the skin and subcutaneous tissue, unspecified: Secondary | ICD-10-CM | POA: Insufficient documentation

## 2019-06-10 DIAGNOSIS — Z8719 Personal history of other diseases of the digestive system: Secondary | ICD-10-CM | POA: Insufficient documentation

## 2019-06-10 DIAGNOSIS — R61 Generalized hyperhidrosis: Secondary | ICD-10-CM | POA: Insufficient documentation

## 2019-06-10 DIAGNOSIS — Z9889 Other specified postprocedural states: Secondary | ICD-10-CM

## 2019-06-10 DIAGNOSIS — F17200 Nicotine dependence, unspecified, uncomplicated: Secondary | ICD-10-CM

## 2019-06-10 NOTE — Assessment & Plan Note (Signed)
More frequent lately- ? About menopause  She has no menses due to [place] IUD and is sexually active No other symptoms  She plans to discuss with her gyn  (? If the progesterone in IUD would affect FSH and LH) Also consider TSH-pt wants to hold off for now

## 2019-06-10 NOTE — Assessment & Plan Note (Signed)
Umbilical repair in past/also tummy tuck Nl exam today- surgical umbilicus is normal to palpation and no bulging or tenderness

## 2019-06-10 NOTE — Patient Instructions (Signed)
Talk to your gyn provider about the night sweats  Hormone tests / thyroid test may be helpful   Take care of yourself  Wear sunscreen I put in a referral for dermatology and our office will call you   I don't think you have a hernia based on today's exam  If you develop pain or other symptoms let us know

## 2019-06-10 NOTE — Assessment & Plan Note (Signed)
Small bump on L nasal bridge- ? If sebaceous gland vs other (basal cell) in pt with h/o sun exposure Ref to dermatology Also some flat wart looking lesions on knees (daughter also has them)- ? Of molluscum  Will refer for that also  Disc imp of sun protection

## 2019-06-10 NOTE — Progress Notes (Signed)
Subjective:    Patient ID: Christine Rice, female    DOB: Oct 16, 1981, 38 y.o.   MRN: [id number]  HPI  Here to check skin spots and ? Hernia as well as night sweats  Wt Readings from Last 3 Encounters:  06/10/19 149 lb 2 oz (67.6 kg)  02/15/19 139 lb 5 oz (63.2 kg)  01/23/19 144 lb (65.3 kg)   24.44 kg/m   Smoking status  Is cutting back   Spot on L side of nose/under bridge  Discolored/not raised  Also a mole on back of L calf  Also bumps on knees   Has tinea versicolor on back -with lotrimin (that is getting better)   She has had a tummy tuck Was told she may have a "pseudo" hernia  No pain  Belly button looks like it sticks out a bit   Night sweats  Thinks she is going through some hormonal change  Chronic nausea in the am - chronic (has learned coping mechanisms) -uses peppermint oil  Has the mirena IUD - over 10 years No menses   She tends to eat later in the day (most of her food is late)   She is sexually active  No hot flashes during the day  Patient Active Problem List   Diagnosis Date Noted  . Night sweats 06/10/2019  . Skin lesion 06/10/2019  . Smoking 07/14/2014  . Routine general medical examination at a health care facility 07/07/2014  . Umbilical hernia 20/25/4270  . Cesarean delivery delivered 01/19/2012  . IBS 08/15/2007  . 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 Anxiety disorder 08/14/2007  . DEPRESSION 08/14/2007  . ADD 08/14/2007   Past Medical History:  Diagnosis Date  . Anxiety    Past Surgical History:  Procedure Laterality Date  . CESAREAN SECTION  01/06/05  . CESAREAN SECTION  01/19/2012   Procedure: CESAREAN SECTION;  Surgeon: Luz Lex, MD;  Location: [facility];  Service: Gynecology;  Laterality: N/A;  repeat  . HERNIA REPAIR    . INGUINAL HERNIA REPAIR  01/19/2012   Procedure: HERNIA REPAIR INGUINAL ADULT BILATERAL;  Surgeon: Adin Hector, MD;  Location: [facility];  Service: General;  Laterality: Bilateral;  . tummy tuck    . UMBILICAL HERNIA  REPAIR  01/19/2012   Procedure: HERNIA REPAIR UMBILICAL ADULT;  Surgeon: Adin Hector, MD;  Location: [facility];  Service: General;  Laterality: N/A;  . UMBILICAL HERNIA REPAIR  11-19-14   Social History   Tobacco Use  . Smoking status: Current Every Day Smoker    Packs/day: 0.10    Types: Cigarettes  . Smokeless tobacco: Never Used  Substance Use Topics  . Alcohol use: No    Alcohol/week: 0.0 standard drinks    Frequency: Never  . Drug use: Yes    Types: Marijuana    Comment: occ- marijuana    Family History  Problem Relation Age of Onset  . Breast cancer Neg Hx   . Cancer Neg Hx   . Stroke Neg Hx    Allergies  Allergen Reactions  . Amoxicillin Hives    REACTION: rash  . Penicillins Hives   Current Outpatient Medications on File Prior to Visit  Medication Sig Dispense Refill  . ALPRAZolam (XANAX) 0.25 MG tablet Take 1 tablet (0.25 mg total) by mouth daily as needed for anxiety. 30 tablet 0  . levonorgestrel (MIRENA) 20 MCG/24HR IUD 1 each by Intrauterine route once.    . Multiple Vitamin (MULTIVITAMIN) tablet Take 1 tablet  by mouth 2 (two) times daily.      No current facility-administered medications on file prior to visit.      Review of Systems  Constitutional: Negative for activity change, appetite change, fatigue, fever and unexpected weight change.  HENT: Negative for congestion, ear pain, rhinorrhea, sinus pressure and sore throat.   Eyes: Negative for pain, redness and visual disturbance.  Respiratory: Negative for cough, shortness of breath and wheezing.   Cardiovascular: Negative for chest pain and palpitations.  Gastrointestinal: Negative for abdominal pain, blood in stool, constipation and diarrhea.  Endocrine: Negative for polydipsia and polyuria.  Genitourinary: Negative for dysuria, frequency and urgency.  Musculoskeletal: Negative for arthralgias, back pain and myalgias.       Some muscle spasms along her scar from tummy tuck   Skin: Negative for  pallor, rash and wound.       Bump on nose  Also knees   Allergic/Immunologic: Negative for environmental allergies.  Neurological: Negative for dizziness, syncope and headaches.  Hematological: Negative for adenopathy. Does not bruise/bleed easily.  Psychiatric/Behavioral: Negative for decreased concentration and dysphoric mood. The patient is not nervous/anxious.        Objective:   Physical Exam Constitutional:      General: She is not in acute distress.    Appearance: Normal appearance. She is normal weight. She is not ill-appearing.  HENT:     Head: Atraumatic.     Mouth/Throat:     Mouth: Mucous membranes are moist.     Pharynx: Oropharynx is clear.  Eyes:     General: No scleral icterus.    Extraocular Movements: Extraocular movements intact.     Conjunctiva/sclera: Conjunctivae normal.     Pupils: Pupils are equal, round, and reactive to light.  Neck:     Musculoskeletal: Normal range of motion. No neck rigidity or muscular tenderness.     Vascular: No carotid bruit.     Comments: Thyroid is prominent due to long neck- not assymetric Cardiovascular:     Rate and Rhythm: Normal rate and regular rhythm.  Pulmonary:     Effort: Pulmonary effort is normal. No respiratory distress.     Breath sounds: Normal breath sounds. No wheezing, rhonchi or rales.  Abdominal:     General: Abdomen is flat. Bowel sounds are normal. There is no distension.     Palpations: Abdomen is soft. There is no mass.     Tenderness: There is no abdominal tenderness.     Hernia: No hernia is present.     Comments: Baseline scar from tummy tuck No hernia noted  Umbilicus (surgical) appears nl  No bulge with valsalva  Lymphadenopathy:     Cervical: No cervical adenopathy.  Skin:    Coloration: Skin is not pale.     Findings: No erythema.     Comments: Tanned 2-3 mm bump on L nasal bridge - slt hyperpigmentation   Multiple flat wart like lesions on both knees   Dermatofibroma on posterior L  calf  Neurological:     Mental Status: She is alert.     Gait: Gait normal.     Deep Tendon Reflexes: Reflexes normal.     Comments: No tremor   Psychiatric:        Mood and Affect: Mood normal.           Assessment & Plan:   Problem List Items Addressed This Visit      Musculoskeletal and Integument   Skin lesion - Primary  Small bump on L nasal bridge- ? If sebaceous gland vs other (basal cell) in pt with h/o sun exposure Ref to dermatology Also some flat wart looking lesions on knees (daughter also has them)- ? Of molluscum  Will refer for that also  Disc imp of sun protection       Relevant Orders   Ambulatory referral to Dermatology     Other   Smoking    Disc in detail risks of smoking and possible outcomes including copd, vascular/ heart disease, cancer , respiratory and sinus infections  Pt voices understanding Per pt- is cutting down successfully so far      Night sweats    More frequent lately- ? About menopause  She has no menses due to [place] IUD and is sexually active No other symptoms  She plans to discuss with her gyn  (? If the progesterone in IUD would affect FSH and LH) Also consider TSH-pt wants to hold off for now       H/O hernia repair    Umbilical repair in past/also tummy tuck Nl exam today- surgical umbilicus is normal to palpation and no bulging or tenderness

## 2019-06-10 NOTE — Assessment & Plan Note (Signed)
Disc in detail risks of smoking and possible outcomes including copd, vascular/ heart disease, cancer , respiratory and sinus infections  Pt voices understanding Per pt- is cutting down successfully so far

## 2019-07-25 ENCOUNTER — Encounter

## 2019-07-25 ENCOUNTER — Ambulatory Visit (INDEPENDENT_AMBULATORY_CARE_PROVIDER_SITE_OTHER): Payer: Medicaid Other | Admitting: Obstetrics and Gynecology

## 2019-07-25 ENCOUNTER — Other Ambulatory Visit: Payer: Self-pay

## 2019-07-25 ENCOUNTER — Encounter: Payer: Self-pay | Admitting: Obstetrics and Gynecology

## 2019-07-25 ENCOUNTER — Other Ambulatory Visit (HOSPITAL_COMMUNITY)
Admission: RE | Admit: 2019-07-25 | Discharge: 2019-07-25 | Disposition: A | Discharge status: Home / Self Care | Payer: Medicaid Other | Source: Ambulatory Visit | Attending: Obstetrics and Gynecology | Admitting: Obstetrics and Gynecology

## 2019-07-25 VITALS — BP 110/65 | HR 78 | Ht 65.5 in | Wt 144.2 lb

## 2019-07-25 DIAGNOSIS — R87618 Other abnormal cytological findings on specimens from cervix uteri: Secondary | ICD-10-CM

## 2019-07-25 DIAGNOSIS — K644 Residual hemorrhoidal skin tags: Secondary | ICD-10-CM

## 2019-07-25 DIAGNOSIS — Z0000 Encounter for general adult medical examination without abnormal findings: Secondary | ICD-10-CM

## 2019-07-25 DIAGNOSIS — R8789 Other abnormal findings in specimens from female genital organs: Secondary | ICD-10-CM

## 2019-07-25 DIAGNOSIS — Z975 Presence of (intrauterine) contraceptive device: Secondary | ICD-10-CM

## 2019-07-25 DIAGNOSIS — Z13220 Encounter for screening for lipoid disorders: Secondary | ICD-10-CM

## 2019-07-25 DIAGNOSIS — Z01419 Encounter for gynecological examination (general) (routine) without abnormal findings: Secondary | ICD-10-CM

## 2019-07-25 DIAGNOSIS — F413 Other mixed anxiety disorders: Secondary | ICD-10-CM

## 2019-07-25 DIAGNOSIS — Z113 Encounter for screening for infections with a predominantly sexual mode of transmission: Secondary | ICD-10-CM

## 2019-07-25 NOTE — Progress Notes (Signed)
Pt is present for annual exam. Pt's last pap was abnormal and several calls were attempted for pt to schedule a colpo. Sent letter to pt address. Pt stated that she did not having health insurance at the time. Pt stated that she is doing well no problems or concerns. Pt stated that she wanted to speak with the doctor about doing several test due to having insurance now.

## 2019-07-25 NOTE — Patient Instructions (Signed)
Health Maintenance, Female Adopting a healthy lifestyle and getting preventive care are important in promoting health and wellness. Ask your health care provider about:  The right schedule for you to have regular tests and exams.  Things you can do on your own to prevent diseases and keep yourself healthy. What should I know about diet, weight, and exercise? Eat a healthy diet   Eat a diet that includes plenty of vegetables, fruits, low-fat dairy products, and lean protein.  Do not eat a lot of foods that are high in solid fats, added sugars, or sodium. Maintain a healthy weight Body mass index (BMI) is used to identify weight problems. It estimates body fat based on height and weight. Your health care provider can help determine your BMI and help you achieve or maintain a healthy weight. Get regular exercise Get regular exercise. This is one of the most important things you can do for your health. Most adults should:  Exercise for at least 150 minutes each week. The exercise should increase your heart rate and make you sweat (moderate-intensity exercise).  Do strengthening exercises at least twice a week. This is in addition to the moderate-intensity exercise.  Spend less time sitting. Even light physical activity can be beneficial. Watch cholesterol and blood lipids Have your blood tested for lipids and cholesterol at 38 years of age, then have this test every 5 years. Have your cholesterol levels checked more often if:  Your lipid or cholesterol levels are high.  You are older than 38 years of age.  You are at high risk for heart disease. What should I know about cancer screening? Depending on your health history and family history, you may need to have cancer screening at various ages. This may include screening for:  Breast cancer.  Cervical cancer.  Colorectal cancer.  Skin cancer.  Lung cancer. What should I know about heart disease, diabetes, and high blood  pressure? Blood pressure and heart disease  High blood pressure causes heart disease and increases the risk of stroke. This is more likely to develop in people who have high blood pressure readings, are of African descent, or are overweight.  Have your blood pressure checked: ? Every 3-5 years if you are 18-39 years of age. ? Every year if you are 40 years old or older. Diabetes Have regular diabetes screenings. This checks your fasting blood sugar level. Have the screening done:  Once every three years after age 40 if you are at a normal weight and have a low risk for diabetes.  More often and at a younger age if you are overweight or have a high risk for diabetes. What should I know about preventing infection? Hepatitis B If you have a higher risk for hepatitis B, you should be screened for this virus. Talk with your health care provider to find out if you are at risk for hepatitis B infection. Hepatitis C Testing is recommended for:  Everyone born from 1945 through 1965.  Anyone with known risk factors for hepatitis C. Sexually transmitted infections (STIs)  Get screened for STIs, including gonorrhea and chlamydia, if: ? You are sexually active and are younger than 38 years of age. ? You are older than 38 years of age and your health care provider tells you that you are at risk for this type of infection. ? Your sexual activity has changed since you were last screened, and you are at increased risk for chlamydia or gonorrhea. Ask your health care provider if   you are at risk.  Ask your health care provider about whether you are at high risk for HIV. Your health care provider may recommend a prescription medicine to help prevent HIV infection. If you choose to take medicine to prevent HIV, you should first get tested for HIV. You should then be tested every 3 months for as long as you are taking the medicine. Pregnancy  If you are about to stop having your period (premenopausal) and  you may become pregnant, seek counseling before you get pregnant.  Take 400 to 800 micrograms (mcg) of folic acid every day if you become pregnant.  Ask for birth control (contraception) if you want to prevent pregnancy. Osteoporosis and menopause Osteoporosis is a disease in which the bones lose minerals and strength with aging. This can result in bone fractures. If you are 65 years old or older, or if you are at risk for osteoporosis and fractures, ask your health care provider if you should:  Be screened for bone loss.  Take a calcium or vitamin D supplement to lower your risk of fractures.  Be given hormone replacement therapy (HRT) to treat symptoms of menopause. Follow these instructions at home: Lifestyle  Do not use any products that contain nicotine or tobacco, such as cigarettes, e-cigarettes, and chewing tobacco. If you need help quitting, ask your health care provider.  Do not use street drugs.  Do not share needles.  Ask your health care provider for help if you need support or information about quitting drugs. Alcohol use  Do not drink alcohol if: ? Your health care provider tells you not to drink. ? You are pregnant, may be pregnant, or are planning to become pregnant.  If you drink alcohol: ? Limit how much you use to 0-1 drink a day. ? Limit intake if you are breastfeeding.  Be aware of how much alcohol is in your drink. In the [place] one drink equals one 12 oz bottle of beer (355 mL), one 5 oz glass of wine (148 mL), or one 1 oz glass of hard liquor (44 mL). General instructions  Schedule regular health, dental, and eye exams.  Stay current with your vaccines.  Tell your health care provider if: ? You often feel depressed. ? You have ever been abused or do not feel safe at home. Summary  Adopting a healthy lifestyle and getting preventive care are important in promoting health and wellness.  Follow your health care provider's instructions about healthy  diet, exercising, and getting tested or screened for diseases.  Follow your health care provider's instructions on monitoring your cholesterol and blood pressure. This information is not intended to replace advice given to you by your health care provider. Make sure you discuss any questions you have with your health care provider. Document Released: 06/13/2011 Document Revised: 11/21/2018 Document Reviewed: 11/21/2018 Elsevier Patient Education  2020 Elsevier Inc.  

## 2019-07-26 ENCOUNTER — Other Ambulatory Visit: Payer: Medicaid Other

## 2019-07-26 DIAGNOSIS — Z01419 Encounter for gynecological examination (general) (routine) without abnormal findings: Secondary | ICD-10-CM

## 2019-07-26 DIAGNOSIS — Z113 Encounter for screening for infections with a predominantly sexual mode of transmission: Secondary | ICD-10-CM

## 2019-07-26 DIAGNOSIS — Z13220 Encounter for screening for lipoid disorders: Secondary | ICD-10-CM

## 2019-07-28 NOTE — Progress Notes (Signed)
GYNECOLOGY ANNUAL PHYSICAL EXAM PROGRESS NOTE  Subjective:    Christine Rice is a 38 y.o. G47P2012 female who presents for an annual exam.  The patient is sexually active.  The patient wears seatbelts: yes. The patient participates in regular exercise: no. Has the patient ever been transfused or tattooed?: no. The patient reports that there is not domestic violence in her life.   The patient has the following complaints today. 1. Patient notes that she has an area near her rectum that she is concerned about.  States that she noticed it several weeks ago.  Feels a small bump near the region. Denies tenderness of the area.  2. Desires to discuss prescription for anxiety.  She notes that she uses Xanax occasionally, that one prescription usually will last her for an entire year. She states that she discussed this with her PCP recently, who noted that she is only seen by her PCP every few years and to discuss with her GYN whom she see's yearly.  3. Desires STD screening.  Patient notes that she had a recent sexual encounter with an ex boyfriend (recently broke up after 5 years together).  Notes that they are not getting back together and he has moved on.   Gynecologic History No LMP recorded. (Menstrual status: IUD).  Menarche age: 22 Contraception: IUD History of STI's: Denies Last Pap: 02/16/2018. Results were: abnormal - NILM bur HR HPV positive (type 16). Did not f/u with colposcopy due to loss of insurance.    OB History  Gravida Para Term Preterm AB Living  3 2 2  0 1 2  SAB TAB Ectopic Multiple Live Births  0 1 0 0 2    # Outcome Date GA Lbr Len/2nd Weight Sex Delivery Anes PTL Lv  3 Term 01/19/12 [redacted]w[redacted]d  6 lb 9.6 oz (2.995 kg) F CS-LTranv EPI  LIV     Name: Crisanto,GIRL Jerry     Apgar1: 9  Apgar5: 9  2 Term 01/06/05    F CS-Unspec   LIV  1 TAB             Past Medical History:  Diagnosis Date  . Anxiety     Past Surgical History:  Procedure Laterality Date  . CESAREAN  SECTION  01/06/05  . CESAREAN SECTION  01/19/2012   Procedure: CESAREAN SECTION;  Surgeon: Luz Lex, MD;  Location: [facility];  Service: Gynecology;  Laterality: N/A;  repeat  . HERNIA REPAIR    . INGUINAL HERNIA REPAIR  01/19/2012   Procedure: HERNIA REPAIR INGUINAL ADULT BILATERAL;  Surgeon: Adin Hector, MD;  Location: [facility];  Service: General;  Laterality: Bilateral;  . tummy tuck    . UMBILICAL HERNIA REPAIR  01/19/2012   Procedure: HERNIA REPAIR UMBILICAL ADULT;  Surgeon: Adin Hector, MD;  Location: [facility];  Service: General;  Laterality: N/A;  . UMBILICAL HERNIA REPAIR  11-19-14    Family History  Problem Relation Age of Onset  . Rashes / Skin problems Mother   . COPD Father   . Breast cancer Neg Hx   . Cancer Neg Hx   . Stroke Neg Hx     Social History   Socioeconomic History  . Marital status: Divorced    Spouse name: Not on file  . Number of children: 2  . Years of education: Not on file  . Highest education level: Not on file  Occupational History  . Occupation: Product manager:  [employer]  . Occupation: Catering--part time  Social Needs  . Financial resource strain: Not on file  . Food insecurity    Worry: Not on file    Inability: Not on file  . Transportation needs    Medical: Not on file    Non-medical: Not on file  Tobacco Use  . Smoking status: Current Every Day Smoker    Packs/day: 0.10    Types: Cigarettes  . Smokeless tobacco: Never Used  Substance and Sexual Activity  . Alcohol use: Yes    Alcohol/week: 0.0 standard drinks    Frequency: Never  . Drug use: Yes    Types: Marijuana    Comment: occ- marijuana   . Sexual activity: Yes    Partners: Male    Birth control/protection: I.U.D.  Lifestyle  . Physical activity    Days per week: Not on file    Minutes per session: Not on file  . Stress: Not on file  Relationships  . Social Herbalist on phone: Not on file    Gets together: Not on file    Attends  religious service: Not on file    Active member of club or organization: Not on file    Attends meetings of clubs or organizations: Not on file    Relationship status: Not on file  . Intimate partner violence    Fear of current or ex partner: Not on file    Emotionally abused: Not on file    Physically abused: Not on file    Forced sexual activity: Not on file  Other Topics Concern  . Not on file  Social History Narrative  . Not on file    Current Outpatient Medications on File Prior to Visit  Medication Sig Dispense Refill  . ALPRAZolam (XANAX) 0.25 MG tablet Take 1 tablet (0.25 mg total) by mouth daily as needed for anxiety. 30 tablet 0  . levonorgestrel (MIRENA) 20 MCG/24HR IUD 1 each by Intrauterine route once.    . Multiple Vitamin (MULTIVITAMIN) tablet Take 1 tablet by mouth 2 (two) times daily.      No current facility-administered medications on file prior to visit.     Allergies  Allergen Reactions  . Amoxicillin Hives    REACTION: rash  . Penicillins Hives     Review of Systems Constitutional: negative for chills, fevers and sweats.   Eyes: negative for irritation, redness and visual disturbance Ears, nose, mouth, throat, and face: negative for hearing loss, nasal congestion, snoring and tinnitus Respiratory: negative for asthma, cough, sputum Cardiovascular: negative for chest pain, dyspnea, exertional chest pressure/discomfort, irregular heart beat, palpitations and syncope Gastrointestinal: negative for abdominal pain, change in bowel habits, nausea and vomiting Genitourinary: negative for abnormal menstrual periods, genital lesions, sexual problems and vaginal discharge, dysuria and urinary incontinence. Integument/breast: negative for breast lump, breast tenderness and nipple discharge Hematologic/lymphatic: negative for bleeding and easy bruising Musculoskeletal:negative for back pain and muscle weakness Neurological: negative for dizziness, headaches,  vertigo and weakness Endocrine: negative for diabetic symptoms including polydipsia, polyuria and skin dryness Allergic/Immunologic: negative for hay fever and urticaria        Objective:  Blood pressure 110/65, pulse 78, height 5' 5.5" (1.664 m), weight 144 lb 3.2 oz (65.4 kg). Body mass index is 23.63 kg/m.  General Appearance:    Alert, cooperative, no distress, appears stated age  Head:    Normocephalic, without obvious abnormality, atraumatic  Eyes:    PERRL, conjunctiva/corneas clear, EOM's  intact, both eyes  Ears:    Normal external ear canals, both ears  Nose:   Nares normal, septum midline, mucosa normal, no drainage or sinus tenderness  Throat:   Lips, mucosa, and tongue normal; teeth and gums normal  Neck:   Supple, symmetrical, trachea midline, no adenopathy; thyroid: no enlargement/tenderness/nodules; no carotid bruit or JVD  Back:     Symmetric, no curvature, ROM normal, no CVA tenderness  Lungs:     Clear to auscultation bilaterally, respirations unlabored  Chest Wall:    No tenderness or deformity   Heart:    Regular rate and rhythm, S1 and S2 normal, no murmur, rub or gallop  Breast Exam:    No tenderness, masses, or nipple abnormality  Abdomen:     Soft, non-tender, bowel sounds active all four quadrants, no masses, no organomegaly.  Lower transabdominal incision well healed.   Genitalia:    Pelvic:external genitalia normal, vagina without lesions, discharge, or tenderness, rectovaginal septum  normal. Cervix normal in appearance, no cervical motion tenderness.  IUD threads visible from cervical os, 3 cm in length.  No adnexal masses or tenderness.  Uterus normal size, shape, mobile, regular contours, nontender.  Rectal:    Normal external sphincter.  No hemorrhoids appreciated. Small rectal skin tag present. Internal exam not done.   Extremities:   Extremities normal, atraumatic, no cyanosis or edema  Pulses:   2+ and symmetric all extremities  Skin:   Skin color,  texture, turgor normal, no rashes or lesions  Lymph nodes:   Cervical, supraclavicular, and axillary nodes normal  Neurologic:   CNII-XII intact, normal strength, sensation and reflexes throughout   .  Labs:  Lab Results  Component Value Date   WBC 7.4 01/23/2019   HGB 15.7 (H) 01/23/2019   HCT 44.9 01/23/2019   MCV 89.4 01/23/2019   PLT 256 01/23/2019    Lab Results  Component Value Date   CREATININE 0.55 01/23/2019   BUN 6 01/23/2019   NA 137 01/23/2019   K 3.8 01/23/2019   CL 105 01/23/2019   CO2 24 01/23/2019    Lab Results  Component Value Date   ALT 12 01/23/2019   AST 15 01/23/2019   ALKPHOS 43 01/23/2019   BILITOT 1.0 01/23/2019    Lab Results  Component Value Date   CHOL 160 07/05/2016   HDL 39.90 07/05/2016   LDLCALC 107 (H) 07/05/2016   TRIG 69.0 07/05/2016   CHOLHDL 4 07/05/2016    Lab Results  Component Value Date   TSH 0.60 07/05/2016     Assessment:   Routine gynecologic exam HPV positive IUD in place STD screening Anal skin tag Anxiety  Plan:     Blood tests: STD screening (per pt request), Lipid panel, TSH ordered. Will return to have fasting labs.  Breast self exam technique reviewed and patient encouraged to perform self-exam monthly. Contraception: IUD in place (since 09/2015). Discussed healthy lifestyle modifications. Pap smear performed again today due to HR HPV + testing on last pap with no colposcopy follow up.   Discussion had on anxiety and medications. Noted that I would be ok with providing the occasional Xanax prescription providing good stewardship over medication as she only requires usually 1 prescription per year.  Anal skin tag, discussed with patient, does desirerequire any intervention at this time.  Follow up in 1 year.    Rubie Maid, MD [facility]

## 2019-07-28 NOTE — Addendum Note (Signed)
Addended by: Augusto Gamble on: 07/28/2019 11:51 PM   Modules accepted: Orders

## 2019-07-30 LAB — HEPATITIS C ANTIBODY: Hep C Virus Ab: <0.1 s/co ratio (ref 0.0–0.9)

## 2019-07-30 LAB — LIPID PANEL
Chol/HDL Ratio: 3.6 ratio (ref 0.0–4.4)
Cholesterol, Total: 166 mg/dL (ref 100–199)
HDL: 46 mg/dL (ref >39)
LDL Calculated: 108 mg/dL — ABNORMAL HIGH (ref 0–99)
Triglycerides: 60 mg/dL (ref 0–149)
VLDL Cholesterol Cal: 12 mg/dL (ref 5–40)

## 2019-07-30 LAB — CYTOLOGY - PAP
Diagnosis: NEGATIVE
HPV: NOT DETECTED

## 2019-07-30 LAB — HIV ANTIBODY (ROUTINE TESTING W REFLEX): HIV Screen 4th Generation wRfx: NONREACTIVE

## 2019-07-30 LAB — RPR: RPR Ser Ql: NONREACTIVE

## 2019-07-30 LAB — TSH: TSH: 0.772 u[IU]/mL (ref 0.450–4.500)

## 2019-12-31 ENCOUNTER — Ambulatory Visit
Admission: EM | Admit: 2019-12-31 | Discharge: 2019-12-31 | Disposition: A | Discharge status: Home / Self Care | Payer: Medicaid Other | Attending: Emergency Medicine | Admitting: Emergency Medicine

## 2019-12-31 ENCOUNTER — Ambulatory Visit (INDEPENDENT_AMBULATORY_CARE_PROVIDER_SITE_OTHER): Payer: Medicaid Other | Admitting: Family Medicine

## 2019-12-31 ENCOUNTER — Encounter: Payer: Self-pay | Admitting: Emergency Medicine

## 2019-12-31 ENCOUNTER — Encounter: Payer: Self-pay | Admitting: Family Medicine

## 2019-12-31 ENCOUNTER — Telehealth: Payer: Self-pay | Admitting: *Deleted

## 2019-12-31 ENCOUNTER — Other Ambulatory Visit: Payer: Self-pay

## 2019-12-31 VITALS — BP 144/90 | HR 96 | Temp 97.4°F

## 2019-12-31 DIAGNOSIS — Z87891 Personal history of nicotine dependence: Secondary | ICD-10-CM

## 2019-12-31 DIAGNOSIS — R609 Edema, unspecified: Secondary | ICD-10-CM

## 2019-12-31 DIAGNOSIS — R05 Cough: Secondary | ICD-10-CM

## 2019-12-31 DIAGNOSIS — R058 Other specified cough: Secondary | ICD-10-CM | POA: Insufficient documentation

## 2019-12-31 DIAGNOSIS — R600 Localized edema: Secondary | ICD-10-CM | POA: Insufficient documentation

## 2019-12-31 DIAGNOSIS — R059 Cough, unspecified: Secondary | ICD-10-CM

## 2019-12-31 LAB — POC SARS CORONAVIRUS 2 AG -  ED: SARS Coronavirus 2 Ag: NEGATIVE

## 2019-12-31 NOTE — Assessment & Plan Note (Signed)
Edema in ankles - and also possibly hands and face  No resp distress or CP Pt does have worries about cardiac health given prior smoking  Interested in lab work and further eval  Due to cough-cannot come in here until covid free and cough is improved/resolved Given option of urgent care for more timely eval and she is thinking about it  Stressed imp of covid testing-she plans on this  Enc good fluid intake/ elevation of feet  Also cut sodium/ processed foods  If symptoms worsen inst to seek urgent eval and keep up updated

## 2019-12-31 NOTE — Assessment & Plan Note (Signed)
Quit last month  Commended greatly

## 2019-12-31 NOTE — Telephone Encounter (Signed)
I will see her then  

## 2019-12-31 NOTE — Assessment & Plan Note (Signed)
For a week w/o other significant symptoms  Former smoker 35 y - quit last month (? If post quitting cough)  Some chest tightness/no wheezing  Given into to get covid tested and inst to isolate until result returns  Also given the option of an urgent care visit (so other concerns can be addressed)- she is thinking about it and will let us know her choice and test result inst to watch closely for sob/wheezing/fever or other symptoms

## 2019-12-31 NOTE — Patient Instructions (Signed)
Increase water intake and limit processed foods and sodium as much as possible  Elevate feet when you sit  Go get covid tested with the info given and isolate until result returns  Please call when you get a result   If instead, you decide to go to urgent care for eval of all your issues do let us know as well   Watch closely for wheezing/ shortness of breath/ fever or any new symptoms    Once cough improves/if covid negative we should have you in the office to touch base re: swelling and other medical issues

## 2019-12-31 NOTE — Discharge Instructions (Addendum)
I will call you tomorrow with the results of your lab tests.    Follow-up with your primary care provider in 2 to 3 days when your COVID test comes back.    Your COVID test is pending.  You should self quarantine until your test result is back and is negative.    Go to the emergency department if you have worsening symptoms or develop new symptoms such as weakness, chest pain, shortness of breath, dizziness, heart palpitations, or other concerning symptoms.    Your blood pressure is elevated today at 144/90.  Please have this rechecked by your primary care provider in 2-4 weeks.

## 2019-12-31 NOTE — Telephone Encounter (Signed)
Patient called stating that she has recently had swelling in her face, hands and legs. Patient stated that she has recently gained some weight. Patient stated that she has smoked for years, but has quit. Patient stated that she started with a cough about a week ago that is productive at times yellow/clear. Patient denies a fever or other symptoms. Patient stated that she does not have insurance now. Patient was advised about the discount she will receive not having insurance. Patient was given information to contact [facility] about financial assistance. Virtual visit was scheduled today with Dr. Glori Bickers at 4:15 pm.

## 2019-12-31 NOTE — Progress Notes (Signed)
Virtual Visit via Video Note  I connected with Christine Rice on 12/31/19 at  4:15 PM EST by a video enabled telemedicine application and verified that I am speaking with the correct person using two identifiers.  Location: Patient: home Provider: office    I discussed the limitations of evaluation and management by telemedicine and the availability of in person appointments. The patient expressed understanding and agreed to proceed.  Parties involved in encounter  Patient: Christine Rice   Provider:  Loura Pardon MD    History of Present Illness: Pt presents with cough and swelling   BP is 144/90    (unusual for her)  Pulse is 96 Pulse ox 98%  Temp 97.4  Early last week started coughing (worse in am)  A lot of phlegm  - clear to white/almost yellow Coughed so hard she gagged once   Smoked for 22 years  Feels tight to breathe today   Snoring at night  Not congested  No facial pain or ear or throat pain   Did hit her head last week working in the yard  Had a headache briefly  She gets dizzy more easily than she used to   Sense of taste and smell is fine    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  Has put on weight (in the past she has a hard time keeping wt on)   Things have been good In a good relationship   Mid week - she felt like her ankles were swollen  Tried massage/soaking in bath  And elevation  Tried L-argenine (? For leg health)  Started wearing looser socks and pants   Has mild varicose veins in her legs   Now her face and hands are tight   Wt Readings from Last 3 Encounters:  07/25/19 144 lb 3.2 oz (65.4 kg)  06/10/19 149 lb 2 oz (67.6 kg)  02/15/19 139 lb 5 oz (63.2 kg)   Wt 163 lb this week   She is not eating great- no  difference from usual  That pattern has not changed  Eats fast food 2-3 times per week   Patient Active Problem List   Diagnosis Date Noted  . Pedal edema 12/31/2019  . Productive cough 12/31/2019  . Night sweats 06/10/2019  . Skin lesion 06/10/2019  . H/O hernia repair 06/10/2019  . History of smoking 10-25 pack years 07/14/2014  . Routine general medical examination at a health care facility 07/07/2014  . Umbilical hernia 0000000  . Cesarean delivery delivered 01/19/2012  . IBS 08/15/2007  . 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 Anxiety disorder 08/14/2007  . DEPRESSION 08/14/2007  . ADD 08/14/2007   Past Medical History:  Diagnosis Date  . Anxiety    Past Surgical History:  Procedure Laterality Date  . CESAREAN SECTION  01/06/05  . CESAREAN SECTION  01/19/2012   Procedure: CESAREAN SECTION;  Surgeon: Luz Lex, MD;  Location: [facility];  Service: Gynecology;  Laterality: N/A;  repeat  . HERNIA REPAIR    . INGUINAL HERNIA REPAIR  01/19/2012   Procedure: HERNIA REPAIR INGUINAL ADULT BILATERAL;  Surgeon: Adin Hector, MD;  Location: [facility];  Service: General;  Laterality: Bilateral;  . tummy tuck    .  UMBILICAL HERNIA REPAIR  01/19/2012   Procedure: HERNIA REPAIR UMBILICAL ADULT;  Surgeon: Adin Hector, MD;  Location: [facility];  Service: General;  Laterality: N/A;  . UMBILICAL HERNIA REPAIR  11-19-14   Social History   Tobacco Use  . Smoking status: Former Smoker    Types: Cigarettes    Quit date: 11/15/2019    Years since quitting: 0.1  . Smokeless tobacco: Never Used  Substance Use Topics  . Alcohol use: Yes    Alcohol/week: 0.0 standard drinks  . Drug use: Yes    Types: Marijuana    Comment: occ- marijuana    Family History  Problem Relation Age of Onset  . Rashes / Skin problems Mother   . COPD Father   . Breast cancer Neg Hx   . Cancer Neg Hx   . Stroke Neg Hx    Allergies  Allergen Reactions  . Amoxicillin Hives    REACTION: rash  . Penicillins Hives   Current  Outpatient Medications on File Prior to Visit  Medication Sig Dispense Refill  . levonorgestrel (MIRENA) 20 MCG/24HR IUD 1 each by Intrauterine route once.    . Multiple Vitamin (MULTIVITAMIN) tablet Take 1 tablet by mouth 2 (two) times daily.     Marland Kitchen ALPRAZolam (XANAX) 0.25 MG tablet Take 1 tablet (0.25 mg total) by mouth daily as needed for anxiety. (Patient not taking: Reported on 12/31/2019) 30 tablet 0   No current facility-administered medications on file prior to visit.   Review of Systems  Constitutional: Positive for malaise/fatigue. Negative for chills, diaphoresis and fever.       Weight gain - eating more   HENT: Negative for congestion, ear pain, sinus pain and sore throat.   Eyes: Negative for blurred vision, discharge and redness.  Respiratory: Positive for cough and sputum production. Negative for hemoptysis, shortness of breath, wheezing and stridor.        Some chest tightness  Cardiovascular: Negative for chest pain, palpitations and leg swelling.       Had arm discomfort -both sides just before she quit smoking  HR tends to run in the 90s  Gastrointestinal: Negative for abdominal pain, diarrhea, nausea and vomiting.  Musculoskeletal: Negative for myalgias.  Skin: Negative for rash.  Neurological: Negative for dizziness and headaches.  Psychiatric/Behavioral: The patient is nervous/anxious.        Mood has been fairly good Some worries about her health    Observations/Objective: Patient appears well, in no distress Weight is baseline  No facial asymmetry  (difficult to assess for swelling because she gained weight since I saw her last)   No focal eye swelling  Normal voice-not hoarse and no slurred speech No obvious tremor or mobility impairment Moving neck and UEs normally Able to hear the call well  No cough or shortness of breath during interview no audible wheezing  Talkative and mentally sharp with no cognitive changes No skin changes on face or neck , no rash  or pallor Affect is mildly anxious but pleasant    Assessment and Plan: Problem List Items Addressed This Visit      Other   History of smoking 10-25 pack years    Quit last month  Commended greatly       Pedal edema    Edema in ankles - and also possibly hands and face  No resp distress or CP Pt does have worries about cardiac health given prior smoking  Interested in lab work and further eval  Due to cough-cannot come in here until covid free and cough is improved/resolved Given option of urgent care for more timely eval and she is thinking about it  Stressed imp of covid testing-she plans on this  Enc good fluid intake/ elevation of feet  Also cut sodium/ processed foods  If symptoms worsen inst to seek urgent eval and keep up updated       Productive cough - Primary    For a week w/o other significant symptoms  Former smoker 61 y - quit last month (? If post quitting cough)  Some chest tightness/no wheezing  Given into to get covid tested and inst to isolate until result returns  Also given the option of an urgent care visit (so other concerns can be addressed)- she is thinking about it and will let us know her choice and test result inst to watch closely for sob/wheezing/fever or other symptoms           Follow Up Instructions: ncrease water intake and limit processed foods and sodium as much as possible  Elevate feet when you sit  Go get covid tested with the info given and isolate until result returns  Please call when you get a result   If instead, you decide to go to urgent care for eval of all your issues do let us know as well   Watch closely for wheezing/ shortness of breath/ fever or any new symptoms    Once cough improves/if covid negative we should have you in the office to touch base re: swelling and other medical issues    I discussed the assessment and treatment plan with the patient. The patient was provided an opportunity to ask questions and all  were answered. The patient agreed with the plan and demonstrated an understanding of the instructions.   The patient was advised to call back or seek an in-person evaluation if the symptoms worsen or if the condition fails to improve as anticipated.  I provided 15  minutes of non-face-to-face time during this encounter.   Loura Pardon, MD

## 2019-12-31 NOTE — ED Provider Notes (Addendum)
[facility]    CSN: [id number] Arrival date & time: 12/31/19  1712      History   Chief Complaint Chief Complaint  Patient presents with  . Leg Swelling    bilateral    HPI Christine Rice is a 39 y.o. female.   Patient presents with "swelling" and weight gain x 4-5 days; she reports 5 pound weight gain over the past 3 days.  She also reports a productive cough in the mornings but recently stopped smoking.  She also reports intermittent mild shortness of breath; none currently.  Patient had a video visit with her PCP today; she states she was told she could not be seen in person because she has a cough; she requests a COVID test today so she can be seen by her PCP.  She denies weakness, chest pain, dizziness, palpitations, fever, chills, or other symptoms.  No treatments attempted at home.  The history is provided by the patient.    Past Medical History:  Diagnosis Date  . Anxiety     Patient Active Problem List   Diagnosis Date Noted  . Pedal edema 12/31/2019  . Productive cough 12/31/2019  . Night sweats 06/10/2019  . Skin lesion 06/10/2019  . H/O hernia repair 06/10/2019  . Smoking 07/14/2014  . Routine general medical examination at a health care facility 07/07/2014  . Umbilical hernia 0000000  . Cesarean delivery delivered 01/19/2012  . IBS 08/15/2007  . 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 Anxiety disorder 08/14/2007  . DEPRESSION 08/14/2007  . ADD 08/14/2007    Past Surgical History:  Procedure Laterality Date  . CESAREAN SECTION  01/06/05  . CESAREAN SECTION  01/19/2012   Procedure: CESAREAN SECTION;  Surgeon: Luz Lex, MD;  Location: [facility];  Service: Gynecology;  Laterality: N/A;  repeat  . HERNIA REPAIR    . INGUINAL HERNIA REPAIR  01/19/2012   Procedure: HERNIA REPAIR INGUINAL ADULT BILATERAL;  Surgeon: Adin Hector, MD;  Location: [facility];  Service: General;  Laterality: Bilateral;  . tummy tuck    . UMBILICAL HERNIA REPAIR  01/19/2012   Procedure: HERNIA  REPAIR UMBILICAL ADULT;  Surgeon: Adin Hector, MD;  Location: [facility];  Service: General;  Laterality: N/A;  . UMBILICAL HERNIA REPAIR  11-19-14    OB History    Gravida  3   Para  2   Term  2   Preterm      AB  1   Living  2     SAB      TAB  1   Ectopic      Multiple      Live Births  2            Home Medications    Prior to Admission medications   Medication Sig Start Date End Date Taking? Authorizing Provider  levonorgestrel (MIRENA) 20 MCG/24HR IUD 1 each by Intrauterine route once.   Yes [provider]  Multiple Vitamin (MULTIVITAMIN) tablet Take 1 tablet by mouth 2 (two) times daily.    Yes [provider]  ALPRAZolam (XANAX) 0.25 MG tablet Take 1 tablet (0.25 mg total) by mouth daily as needed for anxiety. Patient not taking: Reported on 12/31/2019 02/15/19   Abner Greenspan, MD    Family History Family History  Problem Relation Age of Onset  . Rashes / Skin problems Mother   . COPD Father   . Breast cancer Neg Hx   . Cancer Neg Hx   .  Stroke Neg Hx     Social History Social History   Tobacco Use  . Smoking status: Former Smoker    Types: Cigarettes    Quit date: 11/15/2019    Years since quitting: 0.1  . Smokeless tobacco: Never Used  Substance Use Topics  . Alcohol use: Yes    Alcohol/week: 0.0 standard drinks  . Drug use: Yes    Types: Marijuana    Comment: occ- marijuana      Allergies   Amoxicillin and Penicillins   Review of Systems Review of Systems  Constitutional: Positive for unexpected weight change. Negative for chills and fever.  HENT: Negative for ear pain and sore throat.   Eyes: Negative for pain and visual disturbance.  Respiratory: Positive for cough. Negative for shortness of breath.   Cardiovascular: Positive for leg swelling. Negative for chest pain and palpitations.  Gastrointestinal: Negative for abdominal pain and vomiting.  Genitourinary: Negative for dysuria and hematuria.    Musculoskeletal: Negative for arthralgias and back pain.  Skin: Negative for color change and rash.  Neurological: Negative for dizziness, seizures, syncope, facial asymmetry, speech difficulty, weakness and numbness.  All other systems reviewed and are negative.    Physical Exam Triage Vital Signs ED Triage Vitals  Enc Vitals Group     BP      Pulse      Resp      Temp      Temp src      SpO2      Weight      Height      Head Circumference      Peak Flow      Pain Score      Pain Loc      Pain Edu?      Excl. in [place]?    No data found.  Updated Vital Signs BP (!) 144/90 (BP Location: Right Arm)   Pulse 97   Temp 98.9 F (37.2 C) (Oral)   Resp 20   Wt 169 lb 9.6 oz (76.9 kg)   SpO2 98%   BMI 27.79 kg/m   Visual Acuity Right Eye Distance:   Left Eye Distance:   Bilateral Distance:    Right Eye Near:   Left Eye Near:    Bilateral Near:     Physical Exam Vitals and nursing note reviewed.  Constitutional:      General: She is not in acute distress.    Appearance: She is well-developed. She is not ill-appearing.  HENT:     Head: Normocephalic and atraumatic.     Mouth/Throat:     Mouth: Mucous membranes are moist.     Pharynx: Oropharynx is clear.  Eyes:     Conjunctiva/sclera: Conjunctivae normal.  Cardiovascular:     Rate and Rhythm: Normal rate and regular rhythm.     Heart sounds: Normal heart sounds. No murmur.  Pulmonary:     Effort: Pulmonary effort is normal. No respiratory distress.     Breath sounds: Normal breath sounds. No wheezing, rhonchi or rales.  Abdominal:     General: Bowel sounds are normal.     Palpations: Abdomen is soft.     Tenderness: There is no abdominal tenderness. There is no guarding or rebound.  Musculoskeletal:     Cervical back: Neck supple.     Right lower leg: Edema present.     Left lower leg: Edema present.     Comments: 1+ bilateral pedal edema.   Skin:  General: Skin is warm and dry.     Findings: No  rash.  Neurological:     General: No focal deficit present.     Mental Status: She is alert and oriented to person, place, and time.     Sensory: No sensory deficit.     Motor: No weakness.     Gait: Gait normal.  Psychiatric:        Mood and Affect: Mood normal.        Behavior: Behavior normal.      UC Treatments / Results  Labs (all labs ordered are listed, but only abnormal results are displayed) Labs Reviewed  NOVEL CORONAVIRUS, NAA  CBC  COMPREHENSIVE METABOLIC PANEL  POC SARS CORONAVIRUS 2 AG -  ED    EKG   Radiology No results found.  Procedures Procedures (including critical care time)  Medications Ordered in UC Medications - No data to display  Initial Impression / Assessment and Plan / UC Course  I have reviewed the triage vital signs and the nursing notes.  Pertinent labs & imaging results that were available during my care of the patient were reviewed by me and considered in my medical decision making (see chart for details).   Edema, Cough.  Discussed with patient at length the limitations of being seen in an urgent care as compared to an emergency department; patient refuses transfer to the ED.  EKG shows sinus rhythm, rate 61, no ST elevation, no previous to compare.  CBC and CMP pending; discussed with patient that I will call her with the test results tomorrow.  POC COVID negative; PCR pending.  Instructed patient to go to the ED if she has worsening symptoms or develops new symptoms such as weakness, chest pain, shortness of breath, dizziness, palpitations, or other concerning symptoms.  Instructed her to follow-up with her PCP in 2 to 3 days when her PCR Covid test results are back.  Discussed with patient that her blood pressure is elevated today and needs to be rechecked by her PCP.  Patient agrees to this plan of care.      Final Clinical Impressions(s) / UC Diagnoses   Final diagnoses:  Edema, unspecified type  Cough     Discharge  Instructions     I will call you tomorrow with the results of your lab tests.    Follow-up with your primary care provider in 2 to 3 days when your COVID test comes back.    Your COVID test is pending.  You should self quarantine until your test result is back and is negative.    Go to the emergency department if you have worsening symptoms or develop new symptoms such as weakness, chest pain, shortness of breath, dizziness, heart palpitations, or other concerning symptoms.    Your blood pressure is elevated today at 144/90.  Please have this rechecked by your primary care provider in 2-4 weeks.           ED Prescriptions    None     PDMP not reviewed this encounter.   Sharion Balloon, NP 12/31/19 [place], [place], NP 12/31/19 1815

## 2019-12-31 NOTE — ED Triage Notes (Signed)
Pt c/o bilateral leg swelling from the knee down. She states it started about 4-5 days ago. She states she can not tell if she is short of breath because she recently quit smoking and she has been reading online. She is also very anxious. Denies chest pain. She later states that today her face and hands were swollen. She states she has a slight cough in the mornings because she recently stopped smoking.

## 2020-01-01 ENCOUNTER — Other Ambulatory Visit: Payer: Self-pay

## 2020-01-01 ENCOUNTER — Emergency Department: Payer: Medicaid Other

## 2020-01-01 ENCOUNTER — Emergency Department
Admission: EM | Admit: 2020-01-01 | Discharge: 2020-01-01 | Disposition: A | Discharge status: Home / Self Care | Payer: Medicaid Other | Attending: Emergency Medicine | Admitting: Emergency Medicine

## 2020-01-01 DIAGNOSIS — N049 Nephrotic syndrome with unspecified morphologic changes: Secondary | ICD-10-CM | POA: Insufficient documentation

## 2020-01-01 DIAGNOSIS — Z87891 Personal history of nicotine dependence: Secondary | ICD-10-CM | POA: Diagnosis not present

## 2020-01-01 DIAGNOSIS — R809 Proteinuria, unspecified: Secondary | ICD-10-CM

## 2020-01-01 DIAGNOSIS — R600 Localized edema: Secondary | ICD-10-CM | POA: Diagnosis present

## 2020-01-01 DIAGNOSIS — R609 Edema, unspecified: Secondary | ICD-10-CM

## 2020-01-01 LAB — HEPATIC FUNCTION PANEL
ALT: 16 U/L (ref 0–44)
AST: 21 U/L (ref 15–41)
Albumin: 2.4 g/dL — ABNORMAL LOW (ref 3.5–5.0)
Alkaline Phosphatase: 45 U/L (ref 38–126)
Bilirubin, Direct: <0.1 mg/dL (ref 0.0–0.2)
Total Bilirubin: 0.7 mg/dL (ref 0.3–1.2)
Total Protein: 5.3 g/dL — ABNORMAL LOW (ref 6.5–8.1)

## 2020-01-01 LAB — BASIC METABOLIC PANEL
Anion gap: 6 (ref 5–15)
BUN: 22 mg/dL — ABNORMAL HIGH (ref 6–20)
CO2: 25 mmol/L (ref 22–32)
Calcium: 8 mg/dL — ABNORMAL LOW (ref 8.9–10.3)
Chloride: 106 mmol/L (ref 98–111)
Creatinine, Ser: 1.11 mg/dL — ABNORMAL HIGH (ref 0.44–1.00)
GFR calc Af Amer: >60 mL/min (ref >60)
GFR calc non Af Amer: >60 mL/min (ref >60)
Glucose, Bld: 95 mg/dL (ref 70–99)
Potassium: 4.4 mmol/L (ref 3.5–5.1)
Sodium: 137 mmol/L (ref 135–145)

## 2020-01-01 LAB — CBC
HCT: 41.7 % (ref 36.0–46.0)
Hematocrit: 39.6 % (ref 34.0–46.6)
Hemoglobin: 13.4 g/dL (ref 11.1–15.9)
Hemoglobin: 14.2 g/dL (ref 12.0–15.0)
MCH: 31.8 pg (ref 26.6–33.0)
MCH: 31 pg (ref 26.0–34.0)
MCHC: 33.8 g/dL (ref 31.5–35.7)
MCHC: 34.1 g/dL (ref 30.0–36.0)
MCV: 94 fL (ref 79–97)
MCV: 91 fL (ref 80.0–100.0)
Platelets: 183 10*3/uL (ref 150–450)
Platelets: 191 10*3/uL (ref 150–400)
RBC: 4.21 x10E6/uL (ref 3.77–5.28)
RBC: 4.58 MIL/uL (ref 3.87–5.11)
RDW: 12.2 % (ref 11.7–15.4)
RDW: 12.3 % (ref 11.5–15.5)
WBC: 9.7 10*3/uL (ref 3.4–10.8)
WBC: 13.2 10*3/uL — ABNORMAL HIGH (ref 4.0–10.5)
nRBC: 0 % (ref 0.0–0.2)

## 2020-01-01 LAB — COMPREHENSIVE METABOLIC PANEL
ALT: 13 IU/L (ref 0–32)
AST: 15 IU/L (ref 0–40)
Albumin/Globulin Ratio: 1.2 (ref 1.2–2.2)
Albumin: 2.6 g/dL — ABNORMAL LOW (ref 3.8–4.8)
Alkaline Phosphatase: 59 IU/L (ref 39–117)
BUN/Creatinine Ratio: 15 (ref 9–23)
BUN: 14 mg/dL (ref 6–20)
Bilirubin Total: 0.2 mg/dL (ref 0.0–1.2)
CO2: 25 mmol/L (ref 20–29)
Calcium: 8.1 mg/dL — ABNORMAL LOW (ref 8.7–10.2)
Chloride: 106 mmol/L (ref 96–106)
Creatinine, Ser: 0.91 mg/dL (ref 0.57–1.00)
GFR calc Af Amer: 93 mL/min/{1.73_m2} (ref >59)
GFR calc non Af Amer: 80 mL/min/{1.73_m2} (ref >59)
Globulin, Total: 2.2 g/dL (ref 1.5–4.5)
Glucose: 88 mg/dL (ref 65–99)
Potassium: 5.1 mmol/L (ref 3.5–5.2)
Sodium: 140 mmol/L (ref 134–144)
Total Protein: 4.8 g/dL — ABNORMAL LOW (ref 6.0–8.5)

## 2020-01-01 LAB — URINALYSIS, COMPLETE (UACMP) WITH MICROSCOPIC
Bilirubin Urine: NEGATIVE
Glucose, UA: NEGATIVE mg/dL
Ketones, ur: NEGATIVE mg/dL
Leukocytes,Ua: NEGATIVE
Nitrite: NEGATIVE
Protein, ur: >=300 mg/dL — AB
Specific Gravity, Urine: 1.033 — ABNORMAL HIGH (ref 1.005–1.030)
pH: 5 (ref 5.0–8.0)

## 2020-01-01 LAB — HEPATITIS B CORE ANTIBODY, TOTAL: Hep B Core Total Ab: NONREACTIVE

## 2020-01-01 LAB — LIPASE, BLOOD: Lipase: 26 U/L (ref 11–51)

## 2020-01-01 LAB — BRAIN NATRIURETIC PEPTIDE: B Natriuretic Peptide: 173 pg/mL — ABNORMAL HIGH (ref 0.0–100.0)

## 2020-01-01 LAB — HIV ANTIBODY (ROUTINE TESTING W REFLEX): HIV Screen 4th Generation wRfx: NONREACTIVE

## 2020-01-01 LAB — HEPATITIS B SURFACE ANTIBODY,QUALITATIVE: Hep B S Ab: NONREACTIVE

## 2020-01-01 LAB — HEPATITIS B SURFACE ANTIGEN: Hepatitis B Surface Ag: NONREACTIVE

## 2020-01-01 LAB — POCT PREGNANCY, URINE: Preg Test, Ur: NEGATIVE

## 2020-01-01 LAB — NOVEL CORONAVIRUS, NAA: SARS-CoV-2, NAA: NOT DETECTED

## 2020-01-01 MED ORDER — FUROSEMIDE 20 MG PO TABS: 10.0000 mg | ORAL_TABLET | Freq: Every day | ORAL | 0 refills | Status: DC

## 2020-01-01 MED ORDER — SODIUM CHLORIDE 0.9% FLUSH: 3.0000 mL | Freq: Once | INTRAVENOUS | Status: DC

## 2020-01-01 NOTE — ED Provider Notes (Signed)
[facility] Emergency Department Provider Note   ____________________________________________   First MD Initiated Contact with Patient 01/01/20 1505     (approximate)  I have reviewed the triage vital signs and the nursing notes.   HISTORY  Chief Complaint Fluid retention    HPI Christine Rice is a 39 y.o. female with past medical history of anxiety who presents to the ED complaining of swelling.  Patient reports that she first noticed swelling in her legs about 4 days ago along with weight gain of 10 to 15 pounds.  She states she has otherwise been feeling well, endorses a mild cough but has not had any fevers, chest pain, or shortness of breath.  She states she has been urinating normally and denies any dysuria or hematuria.  She noticed some swelling around her eyelids but has not noticed any swelling around her abdomen.  She denies any recent itching or rash.  She denies any history of liver or kidney disease, states she only drinks alcohol a couple times a month and does not use drugs.  She was seen yesterday at an urgent care with unremarkable work-up, but was advised she needed to be seen in the ED.  She states she has had a hard time following up with her PCP as they will not see her until she has a negative Covid test due to her cough.        Past Medical History:  Diagnosis Date  . Anxiety     Patient Active Problem List   Diagnosis Date Noted  . Pedal edema 12/31/2019  . Productive cough 12/31/2019  . Night sweats 06/10/2019  . Skin lesion 06/10/2019  . H/O hernia repair 06/10/2019  . History of smoking 10-25 pack years 07/14/2014  . Routine general medical examination at a health care facility 07/07/2014  . Umbilical hernia 0000000  . Cesarean delivery delivered 01/19/2012  . IBS 08/15/2007  . 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 Anxiety disorder 08/14/2007  . DEPRESSION 08/14/2007  . ADD 08/14/2007    Past Surgical History:  Procedure  Laterality Date  . CESAREAN SECTION  01/06/05  . CESAREAN SECTION  01/19/2012   Procedure: CESAREAN SECTION;  Surgeon: Luz Lex, MD;  Location: [facility];  Service: Gynecology;  Laterality: N/A;  repeat  . HERNIA REPAIR    . INGUINAL HERNIA REPAIR  01/19/2012   Procedure: HERNIA REPAIR INGUINAL ADULT BILATERAL;  Surgeon: Adin Hector, MD;  Location: [facility];  Service: General;  Laterality: Bilateral;  . tummy tuck    . UMBILICAL HERNIA REPAIR  01/19/2012   Procedure: HERNIA REPAIR UMBILICAL ADULT;  Surgeon: Adin Hector, MD;  Location: [facility];  Service: General;  Laterality: N/A;  . UMBILICAL HERNIA REPAIR  11-19-14    Prior to Admission medications   Medication Sig Start Date End Date Taking? Authorizing Provider  ALPRAZolam (XANAX) 0.25 MG tablet Take 1 tablet (0.25 mg total) by mouth daily as needed for anxiety. Patient not taking: Reported on 12/31/2019 02/15/19   Tower, Wynelle Fanny, MD  furosemide (LASIX) 20 MG tablet Take 0.5 tablets (10 mg total) by mouth daily. 01/01/20   Blake Divine, MD  levonorgestrel (MIRENA) 20 MCG/24HR IUD 1 each by Intrauterine route once.    [provider]  Multiple Vitamin (MULTIVITAMIN) tablet Take 1 tablet by mouth 2 (two) times daily.     [provider]    Allergies Amoxicillin and Penicillins  Family History  Problem Relation Age of Onset  .  Rashes / Skin problems Mother   . COPD Father   . Breast cancer Neg Hx   . Cancer Neg Hx   . Stroke Neg Hx     Social History Social History   Tobacco Use  . Smoking status: Former Smoker    Types: Cigarettes    Quit date: 11/15/2019    Years since quitting: 0.1  . Smokeless tobacco: Never Used  Substance Use Topics  . Alcohol use: Yes    Alcohol/week: 0.0 standard drinks  . Drug use: Yes    Types: Marijuana    Comment: occ- marijuana     Review of Systems  Constitutional: No fever/chills Eyes: No visual changes. ENT: No sore throat.  Positive for facial  swelling. Cardiovascular: Denies chest pain. Respiratory: Denies shortness of breath. Gastrointestinal: No abdominal pain.  No nausea, no vomiting.  No diarrhea.  No constipation. Genitourinary: Negative for dysuria. Musculoskeletal: Negative for back pain.  Positive for leg swelling. Skin: Negative for rash. Neurological: Negative for headaches, focal weakness or numbness.  ____________________________________________   PHYSICAL EXAM:  VITAL SIGNS: ED Triage Vitals [01/01/20 1132]  Enc Vitals Group     BP (!) 153/92     Pulse Rate 72     Resp 18     Temp 98.4 F (36.9 C)     Temp Source Oral     SpO2 98 %     Weight 169 lb (76.7 kg)     Height 5\' 6"  (1.676 m)     Head Circumference      Peak Flow      Pain Score 0     Pain Loc      Pain Edu?      Excl. in [place]?     Constitutional: Alert and oriented. Eyes: Conjunctivae are normal. Head: Atraumatic.  Mild edema noted to bilateral eyelids, left greater than right. Nose: No congestion/rhinnorhea. Mouth/Throat: Mucous membranes are moist. Neck: Normal ROM Cardiovascular: Normal rate, regular rhythm. Grossly normal heart sounds. Respiratory: Normal respiratory effort.  No retractions. Lungs CTAB. Gastrointestinal: Soft and nontender. No distention. Genitourinary: deferred Musculoskeletal: No lower extremity tenderness, 1+ pitting edema to bilateral lower extremities. Neurologic:  Normal speech and language. No gross focal neurologic deficits are appreciated. Skin:  Skin is warm, dry and intact. No rash noted. Psychiatric: Mood and affect are normal. Speech and behavior are normal.  ____________________________________________   LABS (all labs ordered are listed, but only abnormal results are displayed)  Labs Reviewed  BASIC METABOLIC PANEL - Abnormal; Notable for the following components:      Result Value   BUN 22 (*)    Creatinine, Ser 1.11 (*)    Calcium 8.0 (*)    All other components within normal limits   CBC - Abnormal; Notable for the following components:   WBC 13.2 (*)    All other components within normal limits  URINALYSIS, COMPLETE (UACMP) WITH MICROSCOPIC - Abnormal; Notable for the following components:   Color, Urine AMBER (*)    APPearance CLOUDY (*)    Specific Gravity, Urine 1.033 (*)    Hgb urine dipstick SMALL (*)    Protein, ur >=300 (*)    Bacteria, UA RARE (*)    All other components within normal limits  BRAIN NATRIURETIC PEPTIDE - Abnormal; Notable for the following components:   B Natriuretic Peptide 173.0 (*)    All other components within normal limits  HEPATIC FUNCTION PANEL - Abnormal; Notable for the following components:  Total Protein 5.3 (*)    Albumin 2.4 (*)    All other components within normal limits  LIPASE, BLOOD  PROTEIN ELECTROPHORESIS, SERUM  C3 COMPLEMENT  C4 COMPLEMENT  HEPATITIS B SURFACE ANTIGEN  HEPATITIS B SURFACE ANTIBODY,QUALITATIVE  HEPATITIS C ANTIBODY (REFLEX)  HEPATITIS B CORE ANTIBODY, TOTAL  PROTEIN / CREATININE RATIO, URINE  ANA W/REFLEX IF POSITIVE  ANCA TITERS  MISC [facility] TEST (SEND OUT)  HIV ANTIBODY (ROUTINE TESTING W REFLEX)  KAPPA/LAMBDA LIGHT CHAINS  POC URINE PREG, ED  POCT PREGNANCY, URINE   ____________________________________________  EKG  ED ECG REPORT I, Blake Divine, the attending physician, personally viewed and interpreted this ECG.   Date: 01/01/2020  EKG Time: 11:36  Rate: 67  Rhythm: normal sinus rhythm  Axis: Normal  Intervals:none  ST&T Change: None   PROCEDURES  Procedure(s) performed (including Critical Care):  Procedures   ____________________________________________   INITIAL IMPRESSION / ASSESSMENT AND PLAN / ED COURSE       39 year old female presents to the ED with increasing swelling over the past 4 days, primarily affecting her lower extremities but she has also noticed some swelling around her eyelids and face.  She has not had any allergic sounding symptoms and  she denies any difficulty urinating.  Initial lab work is reassuring, very mild AKI noted but no renal failure to account for her swelling.  I also have a low suspicion for CHF given her lack of cardiac history, EKG unremarkable.  Chest x-ray does show small pleural effusions bilaterally, however patient denies any respiratory symptoms, will check BNP.  We will also check LFTs and lipase for any evidence of liver disease.  If work-up is unremarkable, she may benefit from a short course of Lasix along with close PCP follow-up.  UA shows a significant amount of protein and albumin noted to be low, work-up consistent with nephrotic syndrome.  Case was discussed with Dr. Candiss Norse of nephrology, who recommends course of 10 mg Lasix once daily and he will assist with follow-up early next week.  He will order additional labs to be drawn in the ED, but patient does not have to wait for results.  Work-up shows no evidence of CHF or liver disease.  I have counseled patient to arrange follow-up with nephrology and to return to the ED for new or worsening symptoms.  Patient agrees with plan.      ____________________________________________   FINAL CLINICAL IMPRESSION(S) / ED DIAGNOSES  Final diagnoses:  Nephrotic syndrome  Peripheral edema     ED Discharge Orders         Ordered    furosemide (LASIX) 20 MG tablet  Daily     01/01/20 1915           Note:  This document was prepared using Dragon voice recognition software and may include unintentional dictation errors.   Blake Divine, MD 01/01/20 [phone]

## 2020-01-01 NOTE — ED Triage Notes (Signed)
Pt c/o BL LE swelling and facial swelling with recent weight gain since Saturday, denies any recent illness. States she is having normal bowel and bladder.

## 2020-01-01 NOTE — ED Notes (Signed)
Informed RN Caryl Pina of pt being room.

## 2020-01-02 LAB — PROTEIN / CREATININE RATIO, URINE
Creatinine, Urine: 412 mg/dL
Protein Creatinine Ratio: 5.89 mg/mg{Cre} — ABNORMAL HIGH (ref 0.00–0.15)
Total Protein, Urine: 2426 mg/dL

## 2020-01-03 ENCOUNTER — Encounter: Payer: Self-pay | Admitting: Emergency Medicine

## 2020-01-03 ENCOUNTER — Inpatient Hospital Stay
Admission: EM | Admit: 2020-01-03 | Discharge: 2020-01-07 | DRG: 700 | Disposition: A | Discharge status: Home / Self Care | Payer: Medicaid Other | Attending: Internal Medicine | Admitting: Internal Medicine

## 2020-01-03 ENCOUNTER — Other Ambulatory Visit: Payer: Self-pay | Admitting: Nephrology

## 2020-01-03 ENCOUNTER — Emergency Department
Admission: EM | Admit: 2020-01-03 | Discharge: 2020-01-03 | Discharge status: Absent without leave | Payer: Medicaid Other

## 2020-01-03 ENCOUNTER — Ambulatory Visit
Admission: RE | Admit: 2020-01-03 | Discharge: 2020-01-03 | Disposition: A | Discharge status: Home / Self Care | Payer: Medicaid Other | Source: Ambulatory Visit | Attending: Nephrology | Admitting: Nephrology

## 2020-01-03 ENCOUNTER — Inpatient Hospital Stay: Payer: Medicaid Other

## 2020-01-03 ENCOUNTER — Other Ambulatory Visit: Payer: Self-pay

## 2020-01-03 DIAGNOSIS — R808 Other proteinuria: Secondary | ICD-10-CM

## 2020-01-03 DIAGNOSIS — Z975 Presence of (intrauterine) contraceptive device: Secondary | ICD-10-CM

## 2020-01-03 DIAGNOSIS — R1110 Vomiting, unspecified: Secondary | ICD-10-CM

## 2020-01-03 DIAGNOSIS — K589 Irritable bowel syndrome without diarrhea: Secondary | ICD-10-CM | POA: Diagnosis present

## 2020-01-03 DIAGNOSIS — Z20822 Contact with and (suspected) exposure to covid-19: Secondary | ICD-10-CM | POA: Diagnosis present

## 2020-01-03 DIAGNOSIS — F329 Major depressive disorder, single episode, unspecified: Secondary | ICD-10-CM | POA: Diagnosis present

## 2020-01-03 DIAGNOSIS — Z825 Family history of asthma and other chronic lower respiratory diseases: Secondary | ICD-10-CM

## 2020-01-03 DIAGNOSIS — I10 Essential (primary) hypertension: Secondary | ICD-10-CM | POA: Diagnosis present

## 2020-01-03 DIAGNOSIS — F419 Anxiety disorder, unspecified: Secondary | ICD-10-CM | POA: Diagnosis present

## 2020-01-03 DIAGNOSIS — N179 Acute kidney failure, unspecified: Secondary | ICD-10-CM

## 2020-01-03 DIAGNOSIS — N049 Nephrotic syndrome with unspecified morphologic changes: Principal | ICD-10-CM | POA: Diagnosis present

## 2020-01-03 DIAGNOSIS — Z79899 Other long term (current) drug therapy: Secondary | ICD-10-CM | POA: Diagnosis not present

## 2020-01-03 DIAGNOSIS — Z87891 Personal history of nicotine dependence: Secondary | ICD-10-CM | POA: Diagnosis not present

## 2020-01-03 DIAGNOSIS — F909 Attention-deficit hyperactivity disorder, unspecified type: Secondary | ICD-10-CM | POA: Diagnosis present

## 2020-01-03 DIAGNOSIS — R809 Proteinuria, unspecified: Secondary | ICD-10-CM

## 2020-01-03 DIAGNOSIS — Z880 Allergy status to penicillin: Secondary | ICD-10-CM | POA: Diagnosis not present

## 2020-01-03 DIAGNOSIS — R112 Nausea with vomiting, unspecified: Secondary | ICD-10-CM | POA: Diagnosis present

## 2020-01-03 DIAGNOSIS — R519 Headache, unspecified: Secondary | ICD-10-CM | POA: Diagnosis not present

## 2020-01-03 HISTORY — DX: Nephrotic syndrome with unspecified morphologic changes: N04.9

## 2020-01-03 LAB — CBC
HCT: 40.6 % (ref 36.0–46.0)
Hemoglobin: 13.9 g/dL (ref 12.0–15.0)
MCH: 30.8 pg (ref 26.0–34.0)
MCHC: 34.2 g/dL (ref 30.0–36.0)
MCV: 89.8 fL (ref 80.0–100.0)
Platelets: 201 10*3/uL (ref 150–400)
RBC: 4.52 MIL/uL (ref 3.87–5.11)
RDW: 12 % (ref 11.5–15.5)
WBC: 10.8 10*3/uL — ABNORMAL HIGH (ref 4.0–10.5)
nRBC: 0 % (ref 0.0–0.2)

## 2020-01-03 LAB — COMPREHENSIVE METABOLIC PANEL
ALT: 17 U/L (ref 0–44)
AST: 21 U/L (ref 15–41)
Albumin: 2.4 g/dL — ABNORMAL LOW (ref 3.5–5.0)
Alkaline Phosphatase: 49 U/L (ref 38–126)
Anion gap: 9 (ref 5–15)
BUN: 32 mg/dL — ABNORMAL HIGH (ref 6–20)
CO2: 24 mmol/L (ref 22–32)
Calcium: 7.8 mg/dL — ABNORMAL LOW (ref 8.9–10.3)
Chloride: 104 mmol/L (ref 98–111)
Creatinine, Ser: 1.74 mg/dL — ABNORMAL HIGH (ref 0.44–1.00)
GFR calc Af Amer: 42 mL/min — ABNORMAL LOW (ref >60)
GFR calc non Af Amer: 37 mL/min — ABNORMAL LOW (ref >60)
Glucose, Bld: 99 mg/dL (ref 70–99)
Potassium: 4 mmol/L (ref 3.5–5.1)
Sodium: 137 mmol/L (ref 135–145)
Total Bilirubin: 0.7 mg/dL (ref 0.3–1.2)
Total Protein: 5.5 g/dL — ABNORMAL LOW (ref 6.5–8.1)

## 2020-01-03 LAB — URINALYSIS, COMPLETE (UACMP) WITH MICROSCOPIC
Bilirubin Urine: NEGATIVE
Glucose, UA: NEGATIVE mg/dL
Ketones, ur: NEGATIVE mg/dL
Leukocytes,Ua: NEGATIVE
Nitrite: NEGATIVE
Protein, ur: >=300 mg/dL — AB
Specific Gravity, Urine: 1.03 (ref 1.005–1.030)
pH: 5 (ref 5.0–8.0)

## 2020-01-03 LAB — KAPPA/LAMBDA LIGHT CHAINS
Kappa free light chain: 75.2 mg/L — ABNORMAL HIGH (ref 3.3–19.4)
Kappa, lambda light chain ratio: 1.45 (ref 0.26–1.65)
Lambda free light chains: 51.9 mg/L — ABNORMAL HIGH (ref 5.7–26.3)

## 2020-01-03 LAB — PROTEIN ELECTROPHORESIS, SERUM
A/G Ratio: 1 (ref 0.7–1.7)
Albumin ELP: 2.2 g/dL — ABNORMAL LOW (ref 2.9–4.4)
Alpha-1-Globulin: 0.2 g/dL (ref 0.0–0.4)
Alpha-2-Globulin: 0.6 g/dL (ref 0.4–1.0)
Beta Globulin: 0.7 g/dL (ref 0.7–1.3)
Gamma Globulin: 0.8 g/dL (ref 0.4–1.8)
Globulin, Total: 2.3 g/dL (ref 2.2–3.9)
Total Protein ELP: 4.5 g/dL — ABNORMAL LOW (ref 6.0–8.5)

## 2020-01-03 LAB — ANCA TITERS
Atypical P-ANCA titer: <1:20 {titer}
C-ANCA: <1:20 {titer}
P-ANCA: <1:20 {titer}

## 2020-01-03 LAB — C3 COMPLEMENT: C3 Complement: 111 mg/dL (ref 82–167)

## 2020-01-03 LAB — C4 COMPLEMENT: Complement C4, Body Fluid: 23 mg/dL (ref 12–38)

## 2020-01-03 LAB — LIPASE, BLOOD: Lipase: 32 U/L (ref 11–51)

## 2020-01-03 LAB — ANA W/REFLEX IF POSITIVE: Anti Nuclear Antibody (ANA): NEGATIVE

## 2020-01-03 MED ORDER — ACETAMINOPHEN 325 MG PO TABS
650.0000 mg | ORAL_TABLET | Freq: Four times a day (QID) | ORAL | Status: DC | PRN
  Administered 2020-01-04 – 2020-01-07 (×3): 650 mg via ORAL
  Filled 2020-01-03 (×6): qty 2

## 2020-01-03 MED ORDER — ALPRAZOLAM 0.25 MG PO TABS: 0.2500 mg | ORAL_TABLET | Freq: Every day | ORAL | Status: DC | PRN

## 2020-01-03 MED ORDER — ACETAMINOPHEN 500 MG PO TABS
1000.0000 mg | ORAL_TABLET | Freq: Once | ORAL | Status: AC
  Administered 2020-01-03: 16:00:00 1000 mg via ORAL
  Filled 2020-01-03: qty 2

## 2020-01-03 MED ORDER — PROMETHAZINE HCL 25 MG/ML IJ SOLN
12.5000 mg | Freq: Four times a day (QID) | INTRAMUSCULAR | Status: DC | PRN
  Administered 2020-01-03 – 2020-01-04 (×2): 12.5 mg via INTRAVENOUS
  Filled 2020-01-03 (×2): qty 1

## 2020-01-03 MED ORDER — ENOXAPARIN SODIUM 40 MG/0.4ML ~~LOC~~ SOLN
40.0000 mg | SUBCUTANEOUS | Status: DC
  Administered 2020-01-03: 22:00:00 40 mg via SUBCUTANEOUS
  Filled 2020-01-03: qty 0.4

## 2020-01-03 MED ORDER — SODIUM CHLORIDE 0.9 % IV SOLN: INTRAVENOUS | Status: DC

## 2020-01-03 MED ORDER — ACETAMINOPHEN 650 MG RE SUPP: 650.0000 mg | Freq: Four times a day (QID) | RECTAL | Status: DC | PRN

## 2020-01-03 MED ORDER — SODIUM CHLORIDE 0.9 % IV BOLUS
500.0000 mL | Freq: Once | INTRAVENOUS | Status: AC
  Administered 2020-01-03: 500 mL via INTRAVENOUS

## 2020-01-03 NOTE — ED Triage Notes (Addendum)
Pt here for vomiting and nausea.  Has been recently put on lasix.  Phenergan not helping.  CBG 107 with EMS. 4 mg zofran given by EMS.  Multiple episodes of vomiting today.  Has been seen at urgent care and here over last few days.  Vomiting started last night.  Was recently dx with glomerular nephritis.  NAD at this time. Pt only pain is headache from all the vomiting.  VSS . Pt c/o feeling very weak

## 2020-01-03 NOTE — ED Provider Notes (Signed)
Emergency Department Provider Note  ____________________________________________  Time seen: Approximately 3:58 PM  I have reviewed the triage vital signs and the nursing notes.   HISTORY  Chief Complaint Emesis   Historian Patient     HPI Christine Rice is a 39 y.o. female with a history of recently diagnosed glomerulonephritis presents to the emergency department with persistent vomiting and headache and worsening low back pain.  Patient was seen and evaluated on 01/01/2020 and was tentatively diagnosed with nephrotic syndrome.  Patient states that she has had bilateral lower extremity edema and facial swelling and was sent home from ED on Lasix.  Patient reports that she took 5 mg of Lasix initially and experienced some mild nausea and then increase her dose to 10 mg of Lasix and had multiple episodes of vomiting.  Patient states that she followed up with nephrology this morning who transitioned her to torsemide and provided antinausea medicine. Nephrology diagnosed her with glomerulonephritis. Patient came into the ED after multiple episodes of vomiting and feelings of weakness since taking Torsemide.  Patient states that she still has headache and some nausea but vomiting has improved since she received IV Zofran by EMS.  Patient states that she has not tried Tylenol for her headache as she does not take medicines routinely.  She denies fever or chills at home.     Past Medical History:  Diagnosis Date  . Anxiety   . Nephrotic syndrome      Immunizations up to date:  Yes.     Past Medical History:  Diagnosis Date  . Anxiety   . Nephrotic syndrome     Patient Active Problem List   Diagnosis Date Noted  . AKI (acute kidney injury) ([place]) 01/03/2020  . Pedal edema 12/31/2019  . Productive cough 12/31/2019  . Night sweats 06/10/2019  . Skin lesion 06/10/2019  . H/O hernia repair 06/10/2019  . History of smoking 10-25 pack years 07/14/2014  . Routine general medical  examination at a health care facility 07/07/2014  . Umbilical hernia 0000000  . Cesarean delivery delivered 01/19/2012  . IBS 08/15/2007  . 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 Anxiety disorder 08/14/2007  . DEPRESSION 08/14/2007  . ADD 08/14/2007    Past Surgical History:  Procedure Laterality Date  . CESAREAN SECTION  01/06/05  . CESAREAN SECTION  01/19/2012   Procedure: CESAREAN SECTION;  Surgeon: Luz Lex, MD;  Location: [facility];  Service: Gynecology;  Laterality: N/A;  repeat  . HERNIA REPAIR    . INGUINAL HERNIA REPAIR  01/19/2012   Procedure: HERNIA REPAIR INGUINAL ADULT BILATERAL;  Surgeon: Adin Hector, MD;  Location: [facility];  Service: General;  Laterality: Bilateral;  . tummy tuck    . UMBILICAL HERNIA REPAIR  01/19/2012   Procedure: HERNIA REPAIR UMBILICAL ADULT;  Surgeon: Adin Hector, MD;  Location: [facility];  Service: General;  Laterality: N/A;  . UMBILICAL HERNIA REPAIR  11-19-14    Prior to Admission medications   Medication Sig Start Date End Date Taking? Authorizing Provider  ALPRAZolam (XANAX) 0.25 MG tablet Take 1 tablet (0.25 mg total) by mouth daily as needed for anxiety. Patient not taking: Reported on 12/31/2019 02/15/19   Tower, Wynelle Fanny, MD  furosemide (LASIX) 20 MG tablet Take 0.5 tablets (10 mg total) by mouth daily. 01/01/20   Blake Divine, MD  levonorgestrel (MIRENA) 20 MCG/24HR IUD 1 each by Intrauterine route once.    [provider]  Multiple Vitamin (MULTIVITAMIN) tablet Take 1 tablet  by mouth 2 (two) times daily.     [provider]    Allergies Amoxicillin and Penicillins  Family History  Problem Relation Age of Onset  . Rashes / Skin problems Mother   . COPD Father   . Breast cancer Neg Hx   . Cancer Neg Hx   . Stroke Neg Hx     Social History Social History   Tobacco Use  . Smoking status: Former Smoker    Types: Cigarettes    Quit date: 11/15/2019    Years since quitting: 0.1  . Smokeless tobacco: Never Used  Substance Use  Topics  . Alcohol use: Yes    Alcohol/week: 0.0 standard drinks  . Drug use: Yes    Types: Marijuana    Comment: occ- marijuana      Review of Systems  Constitutional: No fever/chills Eyes:  No discharge ENT: No upper respiratory complaints. Respiratory: no cough. No SOB/ use of accessory muscles to breath Gastrointestinal: Patient has nausea and emesis.  Musculoskeletal: Negative for musculoskeletal pain. Skin: Negative for rash, abrasions, lacerations, ecchymosis.    ____________________________________________   PHYSICAL EXAM:  VITAL SIGNS: ED Triage Vitals  Enc Vitals Group     BP 01/03/20 1323 (!) 149/74     Pulse Rate 01/03/20 1323 69     Resp 01/03/20 1323 18     Temp 01/03/20 1323 98.1 F (36.7 C)     Temp Source 01/03/20 1323 Oral     SpO2 01/03/20 1323 100 %     Weight 01/03/20 1317 169 lb (76.7 kg)     Height 01/03/20 1317 5\' 6"  (1.676 m)     Head Circumference --      Peak Flow --      Pain Score 01/03/20 1316 9     Pain Loc --      Pain Edu? --      Excl. in [place]? --      Constitutional: Alert and oriented. Patient appears fatigued.  Eyes: Conjunctivae are normal. PERRL. EOMI. Head: Atraumatic. ENT:      Ears:       Nose: No congestion/rhinnorhea.      Mouth/Throat: Mucous membranes are moist.  Neck: No stridor.  No cervical spine tenderness to palpation. Cardiovascular: Normal rate, regular rhythm. Normal S1 and S2.  Good peripheral circulation. Respiratory: Normal respiratory effort without tachypnea or retractions. Lungs CTAB. Good air entry to the bases with no decreased or absent breath sounds Gastrointestinal: Bowel sounds x 4 quadrants. Soft and nontender to palpation. No guarding or rigidity. No distention. Musculoskeletal: Full range of motion to all extremities. No obvious deformities noted Neurologic:  Normal for age. No gross focal neurologic deficits are appreciated.  Skin: Patient has 2+ pitting edema of the lower extremities  bilaterally. Psychiatric: Mood and affect are normal for age. Speech and behavior are normal.   ____________________________________________   LABS (all labs ordered are listed, but only abnormal results are displayed)  Labs Reviewed  COMPREHENSIVE METABOLIC PANEL - Abnormal; Notable for the following components:      Result Value   BUN 32 (*)    Creatinine, Ser 1.74 (*)    Calcium 7.8 (*)    Total Protein 5.5 (*)    Albumin 2.4 (*)    GFR calc non Af Amer 37 (*)    GFR calc Af Amer 42 (*)    All other components within normal limits  CBC - Abnormal; Notable for the following components:   WBC  10.8 (*)    All other components within normal limits  SARS CORONAVIRUS 2 (TAT 6-24 HRS)  LIPASE, BLOOD  URINALYSIS, COMPLETE (UACMP) WITH MICROSCOPIC  POC URINE PREG, ED   ____________________________________________  EKG   ____________________________________________  RADIOLOGY Unk Pinto, personally viewed and evaluated these images (plain radiographs) as part of my medical decision making, as well as reviewing the written report by the radiologist.  US RENAL  Result Date: 01/03/2020 CLINICAL DATA:  Proteinuria.  Acute renal failure. EXAM: RENAL / URINARY TRACT ULTRASOUND COMPLETE COMPARISON:  None. FINDINGS: Right Kidney: Renal measurements: 13.6 x 5.8 x 4.8 cm = volume: 201 mL. Minimally increased echogenicity of renal parenchyma is noted. No mass or hydronephrosis visualized. Left Kidney: Renal measurements: 12.3 x 5.6 x 5.4 cm = volume: 195 mL. Minimally increased echogenicity of renal parenchyma is noted. No mass or hydronephrosis visualized. Bladder: Appears normal for degree of bladder distention. Other: None. IMPRESSION: Minimally increased echogenicity of renal parenchyma is noted bilaterally suggesting medical renal disease. No hydronephrosis or renal obstruction is noted. Electronically Signed   By: Marijo Conception M.D.   On: 01/03/2020 10:59     ____________________________________________    PROCEDURES  Procedure(s) performed:     Procedures     Medications  acetaminophen (TYLENOL) tablet 1,000 mg (1,000 mg Oral Given 01/03/20 1544)     ____________________________________________   INITIAL IMPRESSION / ASSESSMENT AND PLAN / ED COURSE  Pertinent labs & imaging results that were available during my care of the patient were reviewed by me and considered in my medical decision making (see chart for details).    Assessment and plan Intractable vomiting Acute kidney injury 39 year old female presents to the emergency department with multiple episodes of vomiting since being diagnosed with glomerulonephritis by nephrology.  Patient was hypertensive at triage but vital signs were otherwise reassuring.  On physical exam, patient was reclined in a fetal position and appeared fatigued.  She had 2+ pitting edema of the lower extremities bilaterally.  No CVA tenderness.  Differential diagnosis includes worsening glomerulonephritis, intractable vomiting, medication side effect, acute kidney injury...  Patient's BUN and creatinine were elevated and trending upward from labs obtained 2 days ago.  CBC was reassuring.  Lipase was within reference range.  ----------------------------------------- 4:19 PM on 01/03/2020 -----------------------------------------  Spoke with nephrologist on-call, Dr. Candiss Norse.  I reviewed patient's most recent symptoms and Dr. Candiss Norse was familiar with patient's care and management timeline.  He recommended admission for intractable vomiting and acute kidney injury  ----------------------------------------- 4:45 PM on 01/03/2020 -----------------------------------------  Consulted medicine patient was accepted for admission.     ____________________________________________  FINAL CLINICAL IMPRESSION(S) / ED DIAGNOSES  Final diagnoses:  Acute kidney injury ([place])  Intractable vomiting  with nausea, unspecified vomiting type      NEW MEDICATIONS STARTED DURING THIS VISIT:  ED Discharge Orders    None          This chart was dictated using voice recognition software/Dragon. Despite best efforts to proofread, errors can occur which can change the meaning. Any change was purely unintentional.     Lannie Fields, PA-C 01/03/20 1646    Duffy Bruce, MD 01/03/20 1700

## 2020-01-03 NOTE — H&P (Addendum)
History and Physical:    Christine Rice   [id number] DOB: 1981/09/03 DOA: 01/03/2020  Referring MD/provider: Vallarie Mare, PA PCP: Abner Greenspan, MD   Patient coming from: Home  Chief Complaint: Vomiting  History of Present Illness:   Christine Rice is an 39 y.o. female with recently diagnosed glomerulonephritis/probable nephrotic syndrome, who presented to the hospital because of vomiting.  She said she has been having facial swelling, abdominal swelling and lateral leg swelling for about a week and she was prescribed Lasix.  She took half a pill but it did not help with swelling.  She was asked to double the dose and take a full pill.  She took a whole tablet of Lasix last night.  Overnight, she started vomiting.  He said vomiting started around 2 AM in the morning.  She had profuse vomiting.  She described it "looked like bile".  She said she did not see any blood she is unsure.  She went to see the nephrologist today and she was given torsemide (which she took this afternoon prior to coming to ED) and she had renal ultrasound.  She came to the emergency room because of worsening vomiting.  She did not get any relief with Zofran.  She does not have abdominal pain.  She said she had a lot of gas but she has no diarrhea or constipation.  She had headache earlier but this has improved after she got Tylenol in the ED.  No fever but she has chills.  No neck stiffness, cough, chest pain  ED Course:  The patient was found to have acute kidney injury with creatinine of 1.74 (up from 1.11).  She got Tylenol in the ED for headache.  ROS:   ROS all other systems reviewed were negative  Past Medical History:   Past Medical History:  Diagnosis Date  . Anxiety   . Nephrotic syndrome     Past Surgical History:   Past Surgical History:  Procedure Laterality Date  . CESAREAN SECTION  01/06/05  . CESAREAN SECTION  01/19/2012   Procedure: CESAREAN SECTION;  Surgeon: Luz Lex, MD;   Location: [facility];  Service: Gynecology;  Laterality: N/A;  repeat  . HERNIA REPAIR    . INGUINAL HERNIA REPAIR  01/19/2012   Procedure: HERNIA REPAIR INGUINAL ADULT BILATERAL;  Surgeon: Adin Hector, MD;  Location: [facility];  Service: General;  Laterality: Bilateral;  . tummy tuck    . UMBILICAL HERNIA REPAIR  01/19/2012   Procedure: HERNIA REPAIR UMBILICAL ADULT;  Surgeon: Adin Hector, MD;  Location: [facility];  Service: General;  Laterality: N/A;  . UMBILICAL HERNIA REPAIR  11-19-14    Social History:   Social History   Socioeconomic History  . Marital status: Divorced    Spouse name: Not on file  . Number of children: 2  . Years of education: Not on file  . Highest education level: Not on file  Occupational History  . Occupation: Product manager: [employer]  . Occupation: Catering--part time  Tobacco Use  . Smoking status: Former Smoker    Types: Cigarettes    Quit date: 11/15/2019    Years since quitting: 0.1  . Smokeless tobacco: Never Used  Substance and Sexual Activity  . Alcohol use: Yes    Alcohol/week: 0.0 standard drinks  . Drug use: Yes    Types: Marijuana    Comment: occ- marijuana   . Sexual activity: Yes  Partners: Male    Birth control/protection: I.U.D.  Other Topics Concern  . Not on file  Social History Narrative  . Not on file   Social Determinants of Health   Financial Resource Strain:   . Difficulty of Paying Living Expenses: Not on file  Food Insecurity:   . Worried About Charity fundraiser in the Last Year: Not on file  . Ran Out of Food in the Last Year: Not on file  Transportation Needs:   . Lack of Transportation (Medical): Not on file  . Lack of Transportation (Non-Medical): Not on file  Physical Activity:   . Days of Exercise per Week: Not on file  . Minutes of Exercise per Session: Not on file  Stress:   . Feeling of Stress : Not on file  Social Connections:   . Frequency of Communication with Friends and  Family: Not on file  . Frequency of Social Gatherings with Friends and Family: Not on file  . Attends Religious Services: Not on file  . Active Member of Clubs or Organizations: Not on file  . Attends Archivist Meetings: Not on file  . Marital Status: Not on file  Intimate Partner Violence:   . Fear of Current or Ex-Partner: Not on file  . Emotionally Abused: Not on file  . Physically Abused: Not on file  . Sexually Abused: Not on file    Allergies   Amoxicillin and Penicillins  Family history:   Family History  Problem Relation Age of Onset  . Rashes / Skin problems Mother   . COPD Father   . Breast cancer Neg Hx   . Cancer Neg Hx   . Stroke Neg Hx     Current Medications:   Prior to Admission medications   Medication Sig Start Date End Date Taking? Authorizing Provider  Multiple Vitamin (MULTIVITAMIN) tablet Take 1 tablet by mouth 2 (two) times daily.    Yes [provider]  ALPRAZolam (XANAX) 0.25 MG tablet Take 1 tablet (0.25 mg total) by mouth daily as needed for anxiety. 02/15/19   Tower, Wynelle Fanny, MD  furosemide (LASIX) 20 MG tablet Take 0.5 tablets (10 mg total) by mouth daily. Patient not taking: Reported on 01/03/2020 01/01/20   Blake Divine, MD  levonorgestrel [facility]) 20 MCG/24HR IUD 1 each by Intrauterine route once.    [provider]    Physical Exam:   Vitals:   01/03/20 1317 01/03/20 1323 01/03/20 1718  BP:  (!) 149/74 (!) 167/93  Pulse:  69 73  Resp:  18 16  Temp:  98.1 F (36.7 C)   TempSrc:  Oral   SpO2:  100% 100%  Weight: 76.7 kg    Height: 5\' 6"  (1.676 m)       Physical Exam: Blood pressure (!) 167/93, pulse 73, temperature 98.1 F (36.7 C), temperature source Oral, resp. rate 16, height 5\' 6"  (1.676 m), weight 76.7 kg, SpO2 100 %. Gen: No acute distress. Head: Normocephalic, atraumatic. Eyes: Pupils equal, round and reactive to light. Extraocular movements intact.  Sclerae nonicteric.  Mouth: Dry mucous  membranes Neck: Supple, no thyromegaly, no lymphadenopathy, no jugular venous distention. Chest: Lungs are clear to auscultation with good air movement. No rales, rhonchi or wheezes.  CV: Heart sounds are regular with an S1, S2. No murmurs, rubs or gallops.  Abdomen: Soft, nontender, mildly distended with normal active bowel sounds. No palpable masses. Extremities: Extremities are without clubbing, or cyanosis.  3+ bilateral pedal edema.  Pedal pulses 2+.  Skin: Warm and dry. No rashes, lesions or wounds Neuro: Alert and oriented times 3; grossly nonfocal.  Psych: Insight is good and judgment is appropriate. Mood and affect normal.   Data Review:    Labs: Basic Metabolic Panel: Recent Labs  Lab 12/31/19 1817 01/01/20 1136 01/03/20 1325  NA 140 137 137  K 5.1 4.4 4.0  CL 106 106 104  CO2 25 25 24   GLUCOSE 88 95 99  BUN 14 22* 32*  CREATININE 0.91 1.11* 1.74*  CALCIUM 8.1* 8.0* 7.8*   Liver Function Tests: Recent Labs  Lab 12/31/19 1817 01/01/20 1136 01/03/20 1325  AST 15 21 21   ALT 13 16 17   ALKPHOS 59 45 49  BILITOT 0.2 0.7 0.7  PROT 4.8* 5.3* 5.5*  ALBUMIN 2.6* 2.4* 2.4*   Recent Labs  Lab 01/01/20 1136 01/03/20 1325  LIPASE 26 32   No results for input(s): AMMONIA in the last 168 hours. CBC: Recent Labs  Lab 12/31/19 1817 01/01/20 1136 01/03/20 1325  WBC 9.7 13.2* 10.8*  HGB 13.4 14.2 13.9  HCT 39.6 41.7 40.6  MCV 94 91.0 89.8  PLT 183 191 201   Cardiac Enzymes: No results for input(s): CKTOTAL, CKMB, CKMBINDEX, TROPONINI in the last 168 hours.  BNP (last 3 results) No results for input(s): PROBNP in the last 8760 hours. CBG: No results for input(s): GLUCAP in the last 168 hours.  Urinalysis    Component Value Date/Time   COLORURINE AMBER (A) 01/01/2020 1136   APPEARANCEUR CLOUDY (A) 01/01/2020 1136   LABSPEC 1.033 (H) 01/01/2020 1136   PHURINE 5.0 01/01/2020 1136   GLUCOSEU NEGATIVE 01/01/2020 1136   HGBUR SMALL (A) 01/01/2020 1136    HGBUR moderate 04/20/2009 [place] 01/01/2020 1136   BILIRUBINUR neg 02/16/2018 1559   KETONESUR NEGATIVE 01/01/2020 1136   PROTEINUR >=300 (A) 01/01/2020 1136   UROBILINOGEN 0.2 02/16/2018 1559   UROBILINOGEN 0.2 04/20/2009 1606   NITRITE NEGATIVE 01/01/2020 1136   LEUKOCYTESUR NEGATIVE 01/01/2020 1136      Radiographic Studies: US RENAL  Result Date: 01/03/2020 CLINICAL DATA:  Proteinuria.  Acute renal failure. EXAM: RENAL / URINARY TRACT ULTRASOUND COMPLETE COMPARISON:  None. FINDINGS: Right Kidney: Renal measurements: 13.6 x 5.8 x 4.8 cm = volume: 201 mL. Minimally increased echogenicity of renal parenchyma is noted. No mass or hydronephrosis visualized. Left Kidney: Renal measurements: 12.3 x 5.6 x 5.4 cm = volume: 195 mL. Minimally increased echogenicity of renal parenchyma is noted. No mass or hydronephrosis visualized. Bladder: Appears normal for degree of bladder distention. Other: None. IMPRESSION: Minimally increased echogenicity of renal parenchyma is noted bilaterally suggesting medical renal disease. No hydronephrosis or renal obstruction is noted. Electronically Signed   By: Marijo Conception M.D.   On: 01/03/2020 10:59   DG Abd 2 Views  Result Date: 01/03/2020 CLINICAL DATA:  Nausea and vomiting EXAM: ABDOMEN - 2 VIEW COMPARISON:  None. FINDINGS: Tiny pleural effusions. Mild airspace disease at the left base. No free air beneath the diaphragm. Nonobstructed bowel-gas pattern. Few air-filled central small bowel loops with a few fluid levels but no distension. No radiopaque calculi. IUD in the pelvis. IMPRESSION: 1. Nonobstructed gas pattern 2. Small pleural effusions and mild left basilar airspace disease, atelectasis versus pneumonia Electronically Signed   By: Donavan Foil M.D.   On: 01/03/2020 17:15       Assessment/Plan:   Active Problems:   AKI (acute kidney injury) ([place])   Intractable vomiting  with nausea   Body mass index is 27.28 kg/m.     Acute kidney injury the setting of newly diagnosed glomerulonephritis/nephrotic syndrome: Admit to MedSurg.  Treat with IV fluids.  Hold Torsemide/ Lasix. Follow-up BMP.  Consulted nephrologist to assist with management.  Intractable vomiting: Keep n.p.o. for now.  Ordered abdominal x-ray for further evaluation.  IV Phenergan as needed for vomiting.    Other information:   DVT prophylaxis: Lovenox Code Status: Full code. Family Communication: Plan discussed with the patient Disposition Plan: Possible discharge to home in 2 to 3 days Consults called: Nephrologist Admission status: Inpatient   The medical decision making is of moderate complexity, therefore this is a level 2 visit.  Time spent 50 minutes  [place] [facility]   How to contact the [facility] Attending or Consulting provider [place] or covering provider during after hours [place], for this patient?   1. Check the care team in [facility] and look for a) attending/consulting [facility] provider listed and b) the [facility] team listed 2. Log into [email or web address] and use [place]'s universal password to access. If you do not have the password, please contact the hospital operator. 3. Locate the [facility] provider you are looking for under [facility] and page to a number that you can be directly reached. 4. If you still have difficulty reaching the provider, please page the [facility] (Director on Call) for the Hospitalists listed on amion for assistance.  01/03/2020, 5:44 PM

## 2020-01-04 DIAGNOSIS — R112 Nausea with vomiting, unspecified: Secondary | ICD-10-CM

## 2020-01-04 LAB — CBC WITH DIFFERENTIAL/PLATELET
Abs Immature Granulocytes: 0.04 10*3/uL (ref 0.00–0.07)
Basophils Absolute: 0.1 10*3/uL (ref 0.0–0.1)
Basophils Relative: 1 %
Eosinophils Absolute: 0.1 10*3/uL (ref 0.0–0.5)
Eosinophils Relative: 1 %
HCT: 38 % (ref 36.0–46.0)
Hemoglobin: 13 g/dL (ref 12.0–15.0)
Immature Granulocytes: 1 %
Lymphocytes Relative: 26 %
Lymphs Abs: 2 10*3/uL (ref 0.7–4.0)
MCH: 31 pg (ref 26.0–34.0)
MCHC: 34.2 g/dL (ref 30.0–36.0)
MCV: 90.7 fL (ref 80.0–100.0)
Monocytes Absolute: 0.7 10*3/uL (ref 0.1–1.0)
Monocytes Relative: 9 %
Neutro Abs: 4.9 10*3/uL (ref 1.7–7.7)
Neutrophils Relative %: 62 %
Platelets: 187 10*3/uL (ref 150–400)
RBC: 4.19 MIL/uL (ref 3.87–5.11)
RDW: 12.2 % (ref 11.5–15.5)
WBC: 7.8 10*3/uL (ref 4.0–10.5)
nRBC: 0 % (ref 0.0–0.2)

## 2020-01-04 LAB — BASIC METABOLIC PANEL
Anion gap: 8 (ref 5–15)
BUN: 36 mg/dL — ABNORMAL HIGH (ref 6–20)
CO2: 23 mmol/L (ref 22–32)
Calcium: 7.3 mg/dL — ABNORMAL LOW (ref 8.9–10.3)
Chloride: 107 mmol/L (ref 98–111)
Creatinine, Ser: 2.19 mg/dL — ABNORMAL HIGH (ref 0.44–1.00)
GFR calc Af Amer: 32 mL/min — ABNORMAL LOW (ref >60)
GFR calc non Af Amer: 28 mL/min — ABNORMAL LOW (ref >60)
Glucose, Bld: 85 mg/dL (ref 70–99)
Potassium: 4.3 mmol/L (ref 3.5–5.1)
Sodium: 138 mmol/L (ref 135–145)

## 2020-01-04 LAB — SARS CORONAVIRUS 2 (TAT 6-24 HRS): SARS Coronavirus 2: NEGATIVE

## 2020-01-04 MED ORDER — SODIUM CHLORIDE 0.9 % IV SOLN
8.0000 mg | Freq: Three times a day (TID) | INTRAVENOUS | Status: DC | PRN
  Filled 2020-01-04 (×2): qty 4

## 2020-01-04 NOTE — Progress Notes (Signed)
[facility]  ROUNDING NOTE   Subjective:  Patient resting in bed comfortably. Admitted for nephrotic syndrome which appears to be of new onset. Patient stated that she is no longer smoking tobacco products but did recently take a vaping approximately 50 days ago. Renal function worse at the moment.  Creatinine up to 2.19 with an EGFR 28. Albumin low at 2.2. Complement levels normal.  Hepatitis serologies thus far negative as well. HIV screen also negative. Renal ultrasound reveals minimally increased echogenicity.  Objective:  Vital signs in last 24 hours:  Temp:  [98.2 F (36.8 C)-98.5 F (36.9 C)] 98.5 F (36.9 C) (01/23 1219) Pulse Rate:  [66-73] 66 (01/23 1219) Resp:  [14-20] 20 (01/23 1219) BP: (127-167)/(80-100) 155/89 (01/23 1219) SpO2:  [97 %-100 %] 100 % (01/23 1219) Weight:  [79.3 kg-80 kg] 79.3 kg (01/23 0649)  Weight change:  Filed Weights   01/03/20 1317 01/04/20 0624 01/04/20 0649  Weight: 76.7 kg 80 kg 79.3 kg    Intake/Output: I/O last 3 completed shifts: In: 450 [I.V.:450] Out: -    Intake/Output this shift:  No intake/output data recorded.  Physical Exam: General: No acute distress  Head: Normocephalic, atraumatic. Moist oral mucosal membranes  Eyes: Mild periorbital edema  Neck: Supple, trachea midline  Lungs:  Clear to auscultation, normal effort  Heart: S1S2 no rubs  Abdomen:  Soft, nontender, bowel sounds present  Extremities: 1+ peripheral edema.  Neurologic: Awake, alert, following commands  Skin: No lesions       Basic Metabolic Panel: Recent Labs  Lab 12/31/19 1817 12/31/19 1817 01/01/20 1136 01/03/20 1325 01/04/20 0642  NA 140  --  137 137 138  K 5.1  --  4.4 4.0 4.3  CL 106  --  106 104 107  CO2 25  --  '25 24 23  ' GLUCOSE 88  --  95 99 85  BUN 14  --  22* 32* 36*  CREATININE 0.91  --  1.11* 1.74* 2.19*  CALCIUM 8.1*   < > 8.0* 7.8* 7.3*   < > = values in this interval not displayed.    Liver Function  Tests: Recent Labs  Lab 12/31/19 1817 01/01/20 1136 01/03/20 1325  AST '15 21 21  ' ALT '13 16 17  ' ALKPHOS 59 45 49  BILITOT 0.2 0.7 0.7  PROT 4.8* 5.3* 5.5*  ALBUMIN 2.6* 2.4* 2.4*   Recent Labs  Lab 01/01/20 1136 01/03/20 1325  LIPASE 26 32   No results for input(s): AMMONIA in the last 168 hours.  CBC: Recent Labs  Lab 12/31/19 1817 01/01/20 1136 01/03/20 1325 01/04/20 0642  WBC 9.7 13.2* 10.8* 7.8  NEUTROABS  --   --   --  4.9  HGB 13.4 14.2 13.9 13.0  HCT 39.6 41.7 40.6 38.0  MCV 94 91.0 89.8 90.7  PLT 183 191 201 187    Cardiac Enzymes: No results for input(s): CKTOTAL, CKMB, CKMBINDEX, TROPONINI in the last 168 hours.  BNP: Invalid input(s): POCBNP  CBG: No results for input(s): GLUCAP in the last 168 hours.  Microbiology: Results for orders placed or performed during the hospital encounter of 01/03/20  SARS CORONAVIRUS 2 (TAT 6-24 HRS) Nasopharyngeal Nasopharyngeal Swab     Status: None   Collection Time: 01/03/20  5:18 PM   Specimen: Nasopharyngeal Swab  Result Value Ref Range Status   SARS Coronavirus 2 NEGATIVE NEGATIVE Final    Comment: (NOTE) SARS-CoV-2 target nucleic acids are NOT DETECTED. The SARS-CoV-2 RNA is generally  detectable in upper and lower respiratory specimens during the acute phase of infection. Negative results do not preclude SARS-CoV-2 infection, do not rule out co-infections with other pathogens, and should not be used as the sole basis for treatment or other patient management decisions. Negative results must be combined with clinical observations, patient history, and epidemiological information. The expected result is Negative. Fact Sheet for Patients: SugarRoll.be Fact Sheet for Healthcare Providers: https://www.woods-mathews.com/ This test is not yet approved or cleared by the [place] FDA and  has been authorized for detection and/or diagnosis of SARS-CoV-2 by FDA under  an Emergency Use Authorization (EUA). This EUA will remain  in effect (meaning this test can be used) for the duration of the COVID-19 declaration under Section 56 4(b)(1) of the Act, 21 U.S.C. section 360bbb-3(b)(1), unless the authorization is terminated or revoked sooner. Performed at [facility], [place] [address] [place], [place] [address]     Coagulation Studies: No results for input(s): LABPROT, INR in the last 72 hours.  Urinalysis: Recent Labs    01/03/20 1742  COLORURINE YELLOW*  LABSPEC 1.030  PHURINE 5.0  GLUCOSEU NEGATIVE  HGBUR SMALL*  BILIRUBINUR NEGATIVE  KETONESUR NEGATIVE  PROTEINUR >=300*  NITRITE NEGATIVE  LEUKOCYTESUR NEGATIVE      Imaging: US RENAL  Result Date: 01/03/2020 CLINICAL DATA:  Proteinuria.  Acute renal failure. EXAM: RENAL / URINARY TRACT ULTRASOUND COMPLETE COMPARISON:  None. FINDINGS: Right Kidney: Renal measurements: 13.6 x 5.8 x 4.8 cm = volume: 201 mL. Minimally increased echogenicity of renal parenchyma is noted. No mass or hydronephrosis visualized. Left Kidney: Renal measurements: 12.3 x 5.6 x 5.4 cm = volume: 195 mL. Minimally increased echogenicity of renal parenchyma is noted. No mass or hydronephrosis visualized. Bladder: Appears normal for degree of bladder distention. Other: None. IMPRESSION: Minimally increased echogenicity of renal parenchyma is noted bilaterally suggesting medical renal disease. No hydronephrosis or renal obstruction is noted. Electronically Signed   By: Marijo Conception M.D.   On: 01/03/2020 10:59   DG Abd 2 Views  Result Date: 01/03/2020 CLINICAL DATA:  Nausea and vomiting EXAM: ABDOMEN - 2 VIEW COMPARISON:  None. FINDINGS: Tiny pleural effusions. Mild airspace disease at the left base. No free air beneath the diaphragm. Nonobstructed bowel-gas pattern. Few air-filled central small bowel loops with a few fluid levels but no distension. No radiopaque calculi. IUD in the pelvis. IMPRESSION: 1. Nonobstructed  gas pattern 2. Small pleural effusions and mild left basilar airspace disease, atelectasis versus pneumonia Electronically Signed   By: Donavan Foil M.D.   On: 01/03/2020 17:15     Medications:   . sodium chloride 75 mL/hr at 01/04/20 0343   . enoxaparin (LOVENOX) injection  40 mg Subcutaneous Q24H   acetaminophen **OR** acetaminophen, ALPRAZolam, promethazine  Assessment/ Plan:  39 y.o. female with past medical history of IBS, ADHD, depression who was recently evaluated by our practice for new onset nephrotic syndrome.  Work up: Renal ultrasound shows mild bilateral echogenicity, negative ANA/ANCA/hepatitis serologies/HIV.  Normal complements.  Patient did recently start vaping.  1.  Proteinuria, nephrotic range.  2.  Acute kidney injury. 3.  Hypertension.  Plan: Patient presents with a very interesting case.  She appears to have new onset nephrotic syndrome.  No newly prescribed physician medications though the patient states that she did recently stop tobacco products and has started using vaping products.  For now we will stop sodium chloride.  Consider starting the patient on diuretics tomorrow.  She has had  some recent nausea and vomiting as well.  We will plan for renal biopsy on Monday.  In addition we will stop Lovenox as we are planning renal biopsy for Monday.   LOS: 1 Francesca Strome 1/23/20213:05 PM

## 2020-01-04 NOTE — Progress Notes (Signed)
Progress Note    Christine Rice  [id number] DOB: [birth year]  DOA: 01/03/2020 PCP: Abner Greenspan, MD      Brief Narrative:    Medical records reviewed and are as summarized below:  Christine Rice is an 39 y.o. female with newly diagnosed glomerulonephritis/nephrotic syndrome, who presented to the hospital with nausea and vomiting.  She was found to have acute kidney injury.      Assessment/Plan:   Active Problems:   AKI (acute kidney injury) ([place])   Intractable vomiting with nausea   Body mass index is 28.22 kg/m.    Acute kidney injury: Creatinine is worse today.  Patient was seen by nephrologist who recommended stopping IV fluids.  Plan for renal biopsy on Monday, 01/06/2020.  Intractable vomiting: Improved.  No bowel obstruction on abdominal x-ray.   Advance diet as tolerated.   Family Communication/Anticipated D/C date and plan/Code Status   DVT prophylaxis: Lovenox Code Status: Full code Family Communication: Plan discussed with patient Disposition Plan: Possible discharge home in 2 to 3 days      Subjective:   No nausea vomiting.  She feels a little better.  Objective:    Vitals:   01/03/20 1941 01/04/20 0355 01/04/20 0624 01/04/20 0649  BP: (!) 141/89 127/80    Pulse: 70 67    Resp: 20 20    Temp: 98.4 F (36.9 C) 98.2 F (36.8 C)    TempSrc: Oral Oral    SpO2: 100% 97%    Weight:   80 kg 79.3 kg  Height:   5\' 6"  (1.676 m)     Intake/Output Summary (Last 24 hours) at 01/04/2020 0925 Last data filed at 01/04/2020 0343 Gross per 24 hour  Intake 450 ml  Output --  Net 450 ml   Filed Weights   01/03/20 1317 01/04/20 0624 01/04/20 0649  Weight: 76.7 kg 80 kg 79.3 kg    Exam:  GEN: NAD SKIN: No rash EYES: EOMI ENT: MMM CV: RRR PULM: CTA B ABD: soft, mild distention, NT, +BS CNS: AAO x 3, non focal EXT: Bilateral leg and pedal edema, no tenderness   Data Reviewed:   I have personally reviewed following labs and  imaging studies:  Labs: Labs show the following:   Basic Metabolic Panel: Recent Labs  Lab 12/31/19 1817 12/31/19 1817 01/01/20 1136 01/01/20 1136 01/03/20 1325 01/04/20 0642  NA 140  --  137  --  137 138  K 5.1   < > 4.4   < > 4.0 4.3  CL 106  --  106  --  104 107  CO2 25  --  25  --  24 23  GLUCOSE 88  --  95  --  99 85  BUN 14  --  22*  --  32* 36*  CREATININE 0.91  --  1.11*  --  1.74* 2.19*  CALCIUM 8.1*  --  8.0*  --  7.8* 7.3*   < > = values in this interval not displayed.   GFR Estimated Creatinine Clearance: 37 mL/min (A) (by C-G formula based on SCr of 2.19 mg/dL (H)). Liver Function Tests: Recent Labs  Lab 12/31/19 1817 01/01/20 1136 01/03/20 1325  AST 15 21 21   ALT 13 16 17   ALKPHOS 59 45 49  BILITOT 0.2 0.7 0.7  PROT 4.8* 5.3* 5.5*  ALBUMIN 2.6* 2.4* 2.4*   Recent Labs  Lab 01/01/20 1136 01/03/20 1325  LIPASE 26 32   No results  for input(s): AMMONIA in the last 168 hours. Coagulation profile No results for input(s): INR, PROTIME in the last 168 hours.  CBC: Recent Labs  Lab 12/31/19 1817 01/01/20 1136 01/03/20 1325 01/04/20 0642  WBC 9.7 13.2* 10.8* 7.8  NEUTROABS  --   --   --  4.9  HGB 13.4 14.2 13.9 13.0  HCT 39.6 41.7 40.6 38.0  MCV 94 91.0 89.8 90.7  PLT 183 191 201 187   Cardiac Enzymes: No results for input(s): CKTOTAL, CKMB, CKMBINDEX, TROPONINI in the last 168 hours. BNP (last 3 results) No results for input(s): PROBNP in the last 8760 hours. CBG: No results for input(s): GLUCAP in the last 168 hours. D-Dimer: No results for input(s): DDIMER in the last 72 hours. Hgb A1c: No results for input(s): HGBA1C in the last 72 hours. Lipid Profile: No results for input(s): CHOL, HDL, LDLCALC, TRIG, CHOLHDL, LDLDIRECT in the last 72 hours. Thyroid function studies: No results for input(s): TSH, T4TOTAL, T3FREE, THYROIDAB in the last 72 hours.  Invalid input(s): FREET3 Anemia work up: No results for input(s): VITAMINB12,  FOLATE, FERRITIN, TIBC, IRON, RETICCTPCT in the last 72 hours. Sepsis Labs: Recent Labs  Lab 12/31/19 1817 01/01/20 1136 01/03/20 1325 01/04/20 0642  WBC 9.7 13.2* 10.8* 7.8    Microbiology Recent Results (from the past 240 hour(s))  Novel Coronavirus, NAA ([facility])     Status: None   Collection Time: 12/31/19  6:17 PM   Specimen: Vein; Nasopharyngeal(NP) swabs in vial transport medium   NASOPHARYNGE  Result Value Ref Range Status   SARS-CoV-2, NAA Not Detected Not Detected Final    Comment: This nucleic acid amplification test was developed and its performance characteristics determined by [employer] and [employer]. Nucleic acid amplification tests include RT-PCR and TMA. This test has not been FDA cleared or approved. This test has been authorized by FDA under an Emergency Use Authorization (EUA). This test is only authorized for the duration of time the declaration that circumstances exist justifying the authorization of the emergency use of in vitro diagnostic tests for detection of SARS-CoV-2 virus and/or diagnosis of COVID-19 infection under section 564(b)(1) of the Act, 21 U.S.C. [id number]) (1), unless the authorization is terminated or revoked sooner. When diagnostic testing is negative, the possibility of a false negative result should be considered in the context of a patient's recent exposures and the presence of clinical signs and symptoms consistent with COVID-19. An individual without symptoms of COVID-19 and who is not shedding SARS-CoV-2 virus wo uld expect to have a negative (not detected) result in this assay.   SARS CORONAVIRUS 2 (TAT 6-24 HRS) Nasopharyngeal Nasopharyngeal Swab     Status: None   Collection Time: 01/03/20  5:18 PM   Specimen: Nasopharyngeal Swab  Result Value Ref Range Status   SARS Coronavirus 2 NEGATIVE NEGATIVE Final    Comment: (NOTE) SARS-CoV-2 target nucleic acids are NOT DETECTED. The SARS-CoV-2 RNA is generally detectable in upper  and lower respiratory specimens during the acute phase of infection. Negative results do not preclude SARS-CoV-2 infection, do not rule out co-infections with other pathogens, and should not be used as the sole basis for treatment or other patient management decisions. Negative results must be combined with clinical observations, patient history, and epidemiological information. The expected result is Negative. Fact Sheet for Patients: SugarRoll.be Fact Sheet for Healthcare Providers: https://www.woods-mathews.com/ This test is not yet approved or cleared by the [place] FDA and  has been authorized for detection and/or diagnosis of SARS-CoV-2 by  FDA under an Emergency Use Authorization (EUA). This EUA will remain  in effect (meaning this test can be used) for the duration of the COVID-19 declaration under Section 56 4(b)(1) of the Act, 21 U.S.C. section 360bbb-3(b)(1), unless the authorization is terminated or revoked sooner. Performed at [facility], [place] [address], [place], [place] [address]     Procedures and diagnostic studies:  US RENAL  Result Date: 01/10/2020 CLINICAL DATA:  Proteinuria.  Acute renal failure. EXAM: RENAL / URINARY TRACT ULTRASOUND COMPLETE COMPARISON:  None. FINDINGS: Right Kidney: Renal measurements: 13.6 x 5.8 x 4.8 cm = volume: 201 mL. Minimally increased echogenicity of renal parenchyma is noted. No mass or hydronephrosis visualized. Left Kidney: Renal measurements: 12.3 x 5.6 x 5.4 cm = volume: 195 mL. Minimally increased echogenicity of renal parenchyma is noted. No mass or hydronephrosis visualized. Bladder: Appears normal for degree of bladder distention. Other: None. IMPRESSION: Minimally increased echogenicity of renal parenchyma is noted bilaterally suggesting medical renal disease. No hydronephrosis or renal obstruction is noted. Electronically Signed   By: Marijo Conception M.D.   On: 01-10-20 10:59     DG Abd 2 Views  Result Date: Jan 10, 2020 CLINICAL DATA:  Nausea and vomiting EXAM: ABDOMEN - 2 VIEW COMPARISON:  None. FINDINGS: Tiny pleural effusions. Mild airspace disease at the left base. No free air beneath the diaphragm. Nonobstructed bowel-gas pattern. Few air-filled central small bowel loops with a few fluid levels but no distension. No radiopaque calculi. IUD in the pelvis. IMPRESSION: 1. Nonobstructed gas pattern 2. Small pleural effusions and mild left basilar airspace disease, atelectasis versus pneumonia Electronically Signed   By: Donavan Foil M.D.   On: 01-10-20 17:15    Medications:   . enoxaparin (LOVENOX) injection  40 mg Subcutaneous Q24H   Continuous Infusions: . sodium chloride 75 mL/hr at 01/04/20 0343     LOS: 1 day   Jearld Hemp  [facility]   *Please refer to [email or web address], password [email or web address] to get updated schedule on who will round on this patient, as hospitalists switch teams weekly. If 7PM-7AM, please contact night-coverage at [email or web address], password [email or web address] for any overnight needs.  01/04/2020, 9:25 AM

## 2020-01-05 LAB — BASIC METABOLIC PANEL
Anion gap: 4 — ABNORMAL LOW (ref 5–15)
BUN: 42 mg/dL — ABNORMAL HIGH (ref 6–20)
CO2: 24 mmol/L (ref 22–32)
Calcium: 7.2 mg/dL — ABNORMAL LOW (ref 8.9–10.3)
Chloride: 106 mmol/L (ref 98–111)
Creatinine, Ser: 1.61 mg/dL — ABNORMAL HIGH (ref 0.44–1.00)
GFR calc Af Amer: 47 mL/min — ABNORMAL LOW (ref >60)
GFR calc non Af Amer: 40 mL/min — ABNORMAL LOW (ref >60)
Glucose, Bld: 88 mg/dL (ref 70–99)
Potassium: 4.1 mmol/L (ref 3.5–5.1)
Sodium: 134 mmol/L — ABNORMAL LOW (ref 135–145)

## 2020-01-05 NOTE — Progress Notes (Signed)
Progress Note    Christine Rice  [id number] DOB: 1981-04-29  DOA: 01/03/2020 PCP: Abner Greenspan, MD      Brief Narrative:    Medical records reviewed and are as summarized below:  Christine Rice is an 39 y.o. female with newly diagnosed glomerulonephritis/nephrotic syndrome, who presented to the hospital with nausea and vomiting.  She was found to have acute kidney injury.      Assessment/Plan:   Active Problems:   AKI (acute kidney injury) ([place])   Intractable vomiting with nausea   Body mass index is 28.04 kg/m.    Acute kidney injury: Creatinine is trending down.  No IV fluids or diuretics for now per nephrologist recommendation. Plan for renal biopsy on Monday, 01/06/2020.  Repeat BMP tomorrow.  Intractable vomiting: Resolved.  No bowel obstruction on abdominal x-ray.      Family Communication/Anticipated D/C date and plan/Code Status   DVT prophylaxis: Lovenox Code Status: Full code Family Communication: Plan discussed with patient Disposition Plan: Possible discharge home in 2 to 3 days      Subjective:   She complained of nausea but it is mild.  She said normally she has nausea when she wakes up in the morning and is usually not severe.  She said she usually gets relief with peppermint.  She still has swelling in her abdomen and legs.  No vomiting or abdominal pain.   Objective:    Vitals:   01/05/20 0500 01/05/20 1130 01/05/20 1210 01/05/20 1300  BP:   (!) 153/91 138/88  Pulse:   69   Resp:   16   Temp:   98.2 F (36.8 C)   TempSrc:   Oral   SpO2:   100%   Weight: 82.6 kg 78.8 kg    Height:        Intake/Output Summary (Last 24 hours) at 01/05/2020 1725 Last data filed at 01/05/2020 1533 Gross per 24 hour  Intake 120 ml  Output 450 ml  Net -330 ml   Filed Weights   01/04/20 0649 01/05/20 0500 01/05/20 1130  Weight: 79.3 kg 82.6 kg 78.8 kg    Exam:  GEN: NAD SKIN: No rash EYES: EOMI ENT: MMM CV: RRR PULM: CTA  B ABD: soft, mild to moderate distension, NT, +BS CNS: AAO x 3, non focal EXT: b/l leg and pedal edema, no tenderness    Data Reviewed:   I have personally reviewed following labs and imaging studies:  Labs: Labs show the following:   Basic Metabolic Panel: Recent Labs  Lab 12/31/19 1817 12/31/19 1817 01/01/20 1136 01/01/20 1136 01/03/20 1325 01/03/20 1325 01/04/20 0642 01/05/20 0353  NA 140  --  137  --  137  --  138 134*  K 5.1   < > 4.4   < > 4.0   < > 4.3 4.1  CL 106  --  106  --  104  --  107 106  CO2 25  --  25  --  24  --  23 24  GLUCOSE 88  --  95  --  99  --  85 88  BUN 14  --  22*  --  32*  --  36* 42*  CREATININE 0.91  --  1.11*  --  1.74*  --  2.19* 1.61*  CALCIUM 8.1*  --  8.0*  --  7.8*  --  7.3* 7.2*   < > = values in this interval not displayed.  GFR Estimated Creatinine Clearance: 50.2 mL/min (A) (by C-G formula based on SCr of 1.61 mg/dL (H)). Liver Function Tests: Recent Labs  Lab 12/31/19 1817 01/01/20 1136 01/03/20 1325  AST 15 21 21   ALT 13 16 17   ALKPHOS 59 45 49  BILITOT 0.2 0.7 0.7  PROT 4.8* 5.3* 5.5*  ALBUMIN 2.6* 2.4* 2.4*   Recent Labs  Lab 01/01/20 1136 01/03/20 1325  LIPASE 26 32   No results for input(s): AMMONIA in the last 168 hours. Coagulation profile No results for input(s): INR, PROTIME in the last 168 hours.  CBC: Recent Labs  Lab 12/31/19 1817 01/01/20 1136 01/03/20 1325 01/04/20 0642  WBC 9.7 13.2* 10.8* 7.8  NEUTROABS  --   --   --  4.9  HGB 13.4 14.2 13.9 13.0  HCT 39.6 41.7 40.6 38.0  MCV 94 91.0 89.8 90.7  PLT 183 191 201 187   Cardiac Enzymes: No results for input(s): CKTOTAL, CKMB, CKMBINDEX, TROPONINI in the last 168 hours. BNP (last 3 results) No results for input(s): PROBNP in the last 8760 hours. CBG: No results for input(s): GLUCAP in the last 168 hours. D-Dimer: No results for input(s): DDIMER in the last 72 hours. Hgb A1c: No results for input(s): HGBA1C in the last 72  hours. Lipid Profile: No results for input(s): CHOL, HDL, LDLCALC, TRIG, CHOLHDL, LDLDIRECT in the last 72 hours. Thyroid function studies: No results for input(s): TSH, T4TOTAL, T3FREE, THYROIDAB in the last 72 hours.  Invalid input(s): FREET3 Anemia work up: No results for input(s): VITAMINB12, FOLATE, FERRITIN, TIBC, IRON, RETICCTPCT in the last 72 hours. Sepsis Labs: Recent Labs  Lab 12/31/19 1817 01/01/20 1136 01/03/20 1325 01/04/20 0642  WBC 9.7 13.2* 10.8* 7.8    Microbiology Recent Results (from the past 240 hour(s))  Novel Coronavirus, NAA ([facility])     Status: None   Collection Time: 12/31/19  6:17 PM   Specimen: Vein; Nasopharyngeal(NP) swabs in vial transport medium   NASOPHARYNGE  Result Value Ref Range Status   SARS-CoV-2, NAA Not Detected Not Detected Final    Comment: This nucleic acid amplification test was developed and its performance characteristics determined by [employer] and [employer]. Nucleic acid amplification tests include RT-PCR and TMA. This test has not been FDA cleared or approved. This test has been authorized by FDA under an Emergency Use Authorization (EUA). This test is only authorized for the duration of time the declaration that circumstances exist justifying the authorization of the emergency use of in vitro diagnostic tests for detection of SARS-CoV-2 virus and/or diagnosis of COVID-19 infection under section 564(b)(1) of the Act, 21 U.S.C. [id number]) (1), unless the authorization is terminated or revoked sooner. When diagnostic testing is negative, the possibility of a false negative result should be considered in the context of a patient's recent exposures and the presence of clinical signs and symptoms consistent with COVID-19. An individual without symptoms of COVID-19 and who is not shedding SARS-CoV-2 virus wo uld expect to have a negative (not detected) result in this assay.   SARS CORONAVIRUS 2 (TAT 6-24 HRS) Nasopharyngeal  Nasopharyngeal Swab     Status: None   Collection Time: 01/03/20  5:18 PM   Specimen: Nasopharyngeal Swab  Result Value Ref Range Status   SARS Coronavirus 2 NEGATIVE NEGATIVE Final    Comment: (NOTE) SARS-CoV-2 target nucleic acids are NOT DETECTED. The SARS-CoV-2 RNA is generally detectable in upper and lower respiratory specimens during the acute phase of infection. Negative results do not preclude SARS-CoV-2 infection,  do not rule out co-infections with other pathogens, and should not be used as the sole basis for treatment or other patient management decisions. Negative results must be combined with clinical observations, patient history, and epidemiological information. The expected result is Negative. Fact Sheet for Patients: SugarRoll.be Fact Sheet for Healthcare Providers: https://www.woods-mathews.com/ This test is not yet approved or cleared by the [place] FDA and  has been authorized for detection and/or diagnosis of SARS-CoV-2 by FDA under an Emergency Use Authorization (EUA). This EUA will remain  in effect (meaning this test can be used) for the duration of the COVID-19 declaration under Section 56 4(b)(1) of the Act, 21 U.S.C. section 360bbb-3(b)(1), unless the authorization is terminated or revoked sooner. Performed at [facility], [place] [address], [place], [place] [address]   Urine Culture     Status: Abnormal (Preliminary result)   Collection Time: 01/03/20  5:48 PM   Specimen: Urine, Random  Result Value Ref Range Status   Specimen Description   Final    URINE, RANDOM Performed at [facility], [address] [place], [place] [address]    Special Requests   Final    NONE Performed at [facility], [address], [place], [place] [address]    Culture (A)  Final    10,000 COLONIES/mL STAPHYLOCOCCUS EPIDERMIDIS SUSCEPTIBILITIES TO FOLLOW Performed at [facility], [place]  [address], [place], [place] [address]    Report Status PENDING  Incomplete    Procedures and diagnostic studies:  No results found.  Medications:    Continuous Infusions: . ondansetron (ZOFRAN) IV       LOS: 2 days   Dannae Kato  [facility]   *Please refer to [email or web address], password [email or web address] to get updated schedule on who will round on this patient, as hospitalists switch teams weekly. If 7PM-7AM, please contact night-coverage at [email or web address], password [email or web address] for any overnight needs.  01/05/2020, 5:25 PM

## 2020-01-05 NOTE — Progress Notes (Signed)
Order given from Dr Holley Raring to make the patient NPO

## 2020-01-05 NOTE — Progress Notes (Signed)
[facility]  ROUNDING NOTE   Subjective:  Patient in good spirits today. We discussed renal biopsy at length tomorrow. Questions answered to the patient satisfaction. IV fluids were stopped yesterday.  Objective:  Vital signs in last 24 hours:  Temp:  [98.2 F (36.8 C)-98.9 F (37.2 C)] 98.2 F (36.8 C) (01/24 1210) Pulse Rate:  [66-75] 69 (01/24 1210) Resp:  [16-20] 16 (01/24 1210) BP: (131-153)/(83-91) 153/91 (01/24 1210) SpO2:  [97 %-100 %] 100 % (01/24 1210) Weight:  [78.8 kg-82.6 kg] 78.8 kg (01/24 1130)  Weight change: 5.987 kg Filed Weights   01/04/20 0649 01/05/20 0500 01/05/20 1130  Weight: 79.3 kg 82.6 kg 78.8 kg    Intake/Output: I/O last 3 completed shifts: In: 1050 [I.V.:1050] Out: 100 [Urine:100]   Intake/Output this shift:  Total I/O In: -  Out: 200 [Urine:200]  Physical Exam: General: No acute distress  Head: Normocephalic, atraumatic. Moist oral mucosal membranes  Eyes: Mild periorbital edema  Neck: Supple, trachea midline  Lungs:  Clear to auscultation, normal effort  Heart: S1S2 no rubs  Abdomen:  Soft, nontender, bowel sounds present  Extremities: 1+ peripheral edema.  Neurologic: Awake, alert, following commands  Skin: No lesions       Basic Metabolic Panel: Recent Labs  Lab 12/31/19 1817 12/31/19 1817 01/01/20 1136 01/01/20 1136 01/03/20 1325 01/04/20 0642 01/05/20 0353  NA 140  --  137  --  137 138 134*  K 5.1  --  4.4  --  4.0 4.3 4.1  CL 106  --  106  --  104 107 106  CO2 25  --  25  --  24 23 24   GLUCOSE 88  --  95  --  99 85 88  BUN 14  --  22*  --  32* 36* 42*  CREATININE 0.91  --  1.11*  --  1.74* 2.19* 1.61*  CALCIUM 8.1*   < > 8.0*   < > 7.8* 7.3* 7.2*   < > = values in this interval not displayed.    Liver Function Tests: Recent Labs  Lab 12/31/19 1817 01/01/20 1136 01/03/20 1325  AST 15 21 21   ALT 13 16 17   ALKPHOS 59 45 49  BILITOT 0.2 0.7 0.7  PROT 4.8* 5.3* 5.5*  ALBUMIN 2.6* 2.4* 2.4*    Recent Labs  Lab 01/01/20 1136 01/03/20 1325  LIPASE 26 32   No results for input(s): AMMONIA in the last 168 hours.  CBC: Recent Labs  Lab 12/31/19 1817 01/01/20 1136 01/03/20 1325 01/04/20 0642  WBC 9.7 13.2* 10.8* 7.8  NEUTROABS  --   --   --  4.9  HGB 13.4 14.2 13.9 13.0  HCT 39.6 41.7 40.6 38.0  MCV 94 91.0 89.8 90.7  PLT 183 191 201 187    Cardiac Enzymes: No results for input(s): CKTOTAL, CKMB, CKMBINDEX, TROPONINI in the last 168 hours.  BNP: Invalid input(s): POCBNP  CBG: No results for input(s): GLUCAP in the last 168 hours.  Microbiology: Results for orders placed or performed during the hospital encounter of 01/03/20  SARS CORONAVIRUS 2 (TAT 6-24 HRS) Nasopharyngeal Nasopharyngeal Swab     Status: None   Collection Time: 01/03/20  5:18 PM   Specimen: Nasopharyngeal Swab  Result Value Ref Range Status   SARS Coronavirus 2 NEGATIVE NEGATIVE Final    Comment: (NOTE) SARS-CoV-2 target nucleic acids are NOT DETECTED. The SARS-CoV-2 RNA is generally detectable in upper and lower respiratory specimens during the acute phase  of infection. Negative results do not preclude SARS-CoV-2 infection, do not rule out co-infections with other pathogens, and should not be used as the sole basis for treatment or other patient management decisions. Negative results must be combined with clinical observations, patient history, and epidemiological information. The expected result is Negative. Fact Sheet for Patients: SugarRoll.be Fact Sheet for Healthcare Providers: https://www.woods-mathews.com/ This test is not yet approved or cleared by the [place] FDA and  has been authorized for detection and/or diagnosis of SARS-CoV-2 by FDA under an Emergency Use Authorization (EUA). This EUA will remain  in effect (meaning this test can be used) for the duration of the COVID-19 declaration under Section 56 4(b)(1) of the Act, 21  U.S.C. section 360bbb-3(b)(1), unless the authorization is terminated or revoked sooner. Performed at [facility], [place] [address], [place], [place] [address]   Urine Culture     Status: None (Preliminary result)   Collection Time: 01/03/20  5:48 PM   Specimen: Urine, Random  Result Value Ref Range Status   Specimen Description   Final    URINE, RANDOM Performed at [facility], [address] [place], [place] [address]    Special Requests   Final    NONE Performed at [facility], [address] [place], [place] [address]    Culture   Final    CULTURE REINCUBATED FOR BETTER GROWTH Performed at [facility], [place] [address], [place], [place] [address]    Report Status PENDING  Incomplete    Coagulation Studies: No results for input(s): LABPROT, INR in the last 72 hours.  Urinalysis: Recent Labs    01/03/20 1742  COLORURINE YELLOW*  LABSPEC 1.030  PHURINE 5.0  GLUCOSEU NEGATIVE  HGBUR SMALL*  BILIRUBINUR NEGATIVE  KETONESUR NEGATIVE  PROTEINUR >=300*  NITRITE NEGATIVE  LEUKOCYTESUR NEGATIVE      Imaging: DG Abd 2 Views  Result Date: 01/03/2020 CLINICAL DATA:  Nausea and vomiting EXAM: ABDOMEN - 2 VIEW COMPARISON:  None. FINDINGS: Tiny pleural effusions. Mild airspace disease at the left base. No free air beneath the diaphragm. Nonobstructed bowel-gas pattern. Few air-filled central small bowel loops with a few fluid levels but no distension. No radiopaque calculi. IUD in the pelvis. IMPRESSION: 1. Nonobstructed gas pattern 2. Small pleural effusions and mild left basilar airspace disease, atelectasis versus pneumonia Electronically Signed   By: Donavan Foil M.D.   On: 01/03/2020 17:15     Medications:   . ondansetron (ZOFRAN) IV      acetaminophen **OR** acetaminophen, ALPRAZolam, ondansetron (ZOFRAN) IV  Assessment/ Plan:  39 y.o. female with past medical history of IBS, ADHD, depression who was recently evaluated by our  practice for new onset nephrotic syndrome.  Work up: Renal ultrasound shows mild bilateral echogenicity, negative ANA/ANCA/hepatitis serologies/HIV.  Normal complements.  Patient did recently start vaping.  1.  Proteinuria, nephrotic range.  2.  Acute kidney injury. 3.  Hypertension.  Plan: Differential diagnosis is extensive including minimal-change disease, FSGS, membranous nephropathy.  As before patient did recently take up vaping which could potentially be playing a role.  We are planning for renal biopsy tomorrow at 10 AM.  Orders have been prepared for this purpose.  Risks, benefits, and alternatives to renal biopsy discussed and patient does wish to proceed.  Continue to hold diuretics as well as IV fluids for now.  Further plan as patient progresses.   LOS: 2 Christine Rice 1/24/20211:07 PM

## 2020-01-06 ENCOUNTER — Encounter: Payer: Self-pay | Admitting: Internal Medicine

## 2020-01-06 ENCOUNTER — Inpatient Hospital Stay: Payer: Medicaid Other

## 2020-01-06 ENCOUNTER — Other Ambulatory Visit: Payer: Self-pay | Admitting: Nephrology

## 2020-01-06 DIAGNOSIS — R112 Nausea with vomiting, unspecified: Secondary | ICD-10-CM

## 2020-01-06 HISTORY — PX: RENAL BIOPSY, PERCUTANEOUS: SUR144

## 2020-01-06 LAB — BASIC METABOLIC PANEL
Anion gap: 3 — ABNORMAL LOW (ref 5–15)
BUN: 41 mg/dL — ABNORMAL HIGH (ref 6–20)
CO2: 24 mmol/L (ref 22–32)
Calcium: 7.4 mg/dL — ABNORMAL LOW (ref 8.9–10.3)
Chloride: 107 mmol/L (ref 98–111)
Creatinine, Ser: 1.57 mg/dL — ABNORMAL HIGH (ref 0.44–1.00)
GFR calc Af Amer: 48 mL/min — ABNORMAL LOW (ref >60)
GFR calc non Af Amer: 41 mL/min — ABNORMAL LOW (ref >60)
Glucose, Bld: 93 mg/dL (ref 70–99)
Potassium: 4.3 mmol/L (ref 3.5–5.1)
Sodium: 134 mmol/L — ABNORMAL LOW (ref 135–145)

## 2020-01-06 LAB — CBC WITH DIFFERENTIAL/PLATELET
Abs Immature Granulocytes: 0.04 10*3/uL (ref 0.00–0.07)
Basophils Absolute: 0.1 10*3/uL (ref 0.0–0.1)
Basophils Relative: 1 %
Eosinophils Absolute: 0.2 10*3/uL (ref 0.0–0.5)
Eosinophils Relative: 2 %
HCT: 41.6 % (ref 36.0–46.0)
Hemoglobin: 14.2 g/dL (ref 12.0–15.0)
Immature Granulocytes: 0 %
Lymphocytes Relative: 27 %
Lymphs Abs: 2.8 10*3/uL (ref 0.7–4.0)
MCH: 30.4 pg (ref 26.0–34.0)
MCHC: 34.1 g/dL (ref 30.0–36.0)
MCV: 89.1 fL (ref 80.0–100.0)
Monocytes Absolute: 0.9 10*3/uL (ref 0.1–1.0)
Monocytes Relative: 8 %
Neutro Abs: 6.5 10*3/uL (ref 1.7–7.7)
Neutrophils Relative %: 62 %
Platelets: 210 10*3/uL (ref 150–400)
RBC: 4.67 MIL/uL (ref 3.87–5.11)
RDW: 12 % (ref 11.5–15.5)
WBC: 10.5 10*3/uL (ref 4.0–10.5)
nRBC: 0 % (ref 0.0–0.2)

## 2020-01-06 LAB — TYPE AND SCREEN
ABO/RH(D): B POS
Antibody Screen: NEGATIVE

## 2020-01-06 LAB — URINE CULTURE

## 2020-01-06 LAB — PROTIME-INR
INR: 0.9 (ref 0.8–1.2)
Prothrombin Time: 12.4 seconds (ref 11.4–15.2)

## 2020-01-06 MED ORDER — ONDANSETRON HCL 4 MG/2ML IJ SOLN
4.0000 mg | Freq: Four times a day (QID) | INTRAMUSCULAR | Status: DC | PRN
  Administered 2020-01-06 – 2020-01-07 (×2): 4 mg via INTRAVENOUS
  Filled 2020-01-06 (×2): qty 2

## 2020-01-06 MED ORDER — OXYCODONE HCL 5 MG PO TABS
5.0000 mg | ORAL_TABLET | Freq: Four times a day (QID) | ORAL | Status: DC | PRN
  Administered 2020-01-06: 16:00:00 5 mg via ORAL
  Filled 2020-01-06: qty 1

## 2020-01-06 MED ORDER — HYDROMORPHONE HCL 1 MG/ML IJ SOLN
0.5000 mg | INTRAMUSCULAR | Status: DC | PRN
  Administered 2020-01-07: 01:00:00 0.5 mg via INTRAVENOUS
  Filled 2020-01-06: qty 0.5

## 2020-01-06 MED ORDER — TORSEMIDE 20 MG PO TABS
20.0000 mg | ORAL_TABLET | Freq: Every day | ORAL | Status: DC
  Administered 2020-01-06 – 2020-01-07 (×2): 20 mg via ORAL
  Filled 2020-01-06 (×2): qty 1

## 2020-01-06 NOTE — Procedures (Addendum)
After obtaining informed consent, the patient was brought down to the ultrasound suite. Subsequently the left kidney was identified under ultrasound. The left flank was prepped and draped in standard sterile fashion. Local anesthesia was achieved using 1% lidocaine. Subsequently using an 18-gauge biopsy device and ultraound guidance, a total of 3 passes were made into the left kidney. The specimens were submitted to pathology and deemed to be adequate for submission. No active bleeding noted on post-biopsy images.   The patient tolerated the procedure very well. She will return to herroom for continued observation.   Estimated blood loss: Minimal.  Complications: No immediate complications.

## 2020-01-06 NOTE — Progress Notes (Signed)
RN notified Dr Holley Raring pt is back from her kidney biopsy per MD okay for pt to have regular diet and also to order torsemide 20 mg daily. RN will continue to assess and monitor pt.

## 2020-01-06 NOTE — Progress Notes (Signed)
[place] visited pt. per OR for AD.  Pt. in bed w/lights dimmed.  Said she was OK w/CH visit but only if we didn't talk about 'the packet' [the AD document]; she expressed feeling 'cruddy' from earlier procedure today.  Pt. shared she was admitted for kidney problem; thinks issue might be related to new vaping liquid.  Underwent kidney biopsy tody. and is awaiting MD analysis of biopsy.  Pt shared has two dtrs.; works as Programmer, applications school.  Originally from [place].  Pt. appears to be coping effectively w/situation; eager to be discharged and return home to her dtrs.  CH provided active listening and a prayer at the conclusion of visit.  Pt. does want to discuss/complete AD, but not today.  Would be open to visit tomorrow before anticipated noon discharge.  Dunn Loring will inform unit chaplain for follow-up. No further needs expressed at this time.         01/06/20 1500  Clinical Encounter Type  Visited With Patient;Health care provider  Visit Type Spiritual support;Social support;Other (Comment) (AD)  Spiritual Encounters  Spiritual Needs Emotional;Prayer  Stress Factors  Patient Stress Factors Family relationships;Health changes;Lack of caregivers;Loss of control

## 2020-01-06 NOTE — Progress Notes (Signed)
Progress Note    Christine Rice  [id number] DOB: 12/24/1980  DOA: 01/03/2020 PCP: Abner Greenspan, MD      Brief Narrative:    Medical records reviewed and are as summarized below:  Christine FAUROT is an 39 y.o. female with newly diagnosed glomerulonephritis/nephrotic syndrome, who presented to the hospital with nausea and vomiting.  She was found to have acute kidney injury.      Assessment/Plan:   Active Problems:   AKI (acute kidney injury) ([place])   Intractable vomiting with nausea   Body mass index is 28.04 kg/m.    Acute kidney injury: Creatinine is trending down.s/p renal biopsy today. Repeat BMP tomorrow.  Requested stronger analgesics for pain from renal biopsy so IV Dilaudid was ordered.  Oxycodone has been added as well for moderate pain.  Started on torsemide today for anasarca.  Intractable vomiting: Resolved.  No bowel obstruction on abdominal x-ray.      Family Communication/Anticipated D/C date and plan/Code Status   DVT prophylaxis: Lovenox Code Status: Full code Family Communication: Plan discussed with patient Disposition Plan: Possible discharge home in 2 to 3 days      Subjective:   She has a little bit of nausea as she usually does in the morning.  No other complaints.  Patient was seen this morning prior to having her biopsy today.  Objective:    Vitals:   01/06/20 0941 01/06/20 1004 01/06/20 1032 01/06/20 1328  BP: (!) 142/85 (!) 147/97 (!) 146/80 (!) 144/95  Pulse: 71 68 66 72  Resp:    16  Temp:   98.3 F (36.8 C) 97.8 F (36.6 C)  TempSrc:   Oral Oral  SpO2: 98% 99% 99% 99%  Weight:      Height:        Intake/Output Summary (Last 24 hours) at 01/06/2020 1650 Last data filed at 01/06/2020 1330 Gross per 24 hour  Intake 300 ml  Output 1030 ml  Net -730 ml   Filed Weights   01/04/20 0649 01/05/20 0500 01/05/20 1130  Weight: 79.3 kg 82.6 kg 78.8 kg    Exam:  GEN: NAD SKIN: No rash EYES: EOMI ENT:  MMM CV: Rate and rhythm  PULM: No wheezing or rales heard ABD: soft, mild to moderate distension, NT, +BS CNS: AAO x 3, non focal EXT: b/l leg and pedal edema, no tenderness    Data Reviewed:   I have personally reviewed following labs and imaging studies:  Labs: Labs show the following:   Basic Metabolic Panel: Recent Labs  Lab 01/01/20 1136 01/01/20 1136 01/03/20 1325 01/03/20 1325 01/04/20 0642 01/04/20 0642 01/05/20 0353 01/06/20 0449  NA 137  --  137  --  138  --  134* 134*  K 4.4   < > 4.0   < > 4.3   < > 4.1 4.3  CL 106  --  104  --  107  --  106 107  CO2 25  --  24  --  23  --  24 24  GLUCOSE 95  --  99  --  85  --  88 93  BUN 22*  --  32*  --  36*  --  42* 41*  CREATININE 1.11*  --  1.74*  --  2.19*  --  1.61* 1.57*  CALCIUM 8.0*  --  7.8*  --  7.3*  --  7.2* 7.4*   < > = values in this interval not  displayed.   GFR Estimated Creatinine Clearance: 51.5 mL/min (A) (by C-G formula based on SCr of 1.57 mg/dL (H)). Liver Function Tests: Recent Labs  Lab 12/31/19 1817 01/01/20 1136 01/03/20 1325  AST 15 21 21   ALT 13 16 17   ALKPHOS 59 45 49  BILITOT 0.2 0.7 0.7  PROT 4.8* 5.3* 5.5*  ALBUMIN 2.6* 2.4* 2.4*   Recent Labs  Lab 01/01/20 1136 01/03/20 1325  LIPASE 26 32   No results for input(s): AMMONIA in the last 168 hours. Coagulation profile Recent Labs  Lab 01/06/20 0813  INR 0.9    CBC: Recent Labs  Lab 12/31/19 1817 01/01/20 1136 01/03/20 1325 01/04/20 0642 01/06/20 0813  WBC 9.7 13.2* 10.8* 7.8 10.5  NEUTROABS  --   --   --  4.9 6.5  HGB 13.4 14.2 13.9 13.0 14.2  HCT 39.6 41.7 40.6 38.0 41.6  MCV 94 91.0 89.8 90.7 89.1  PLT 183 191 201 187 210   Cardiac Enzymes: No results for input(s): CKTOTAL, CKMB, CKMBINDEX, TROPONINI in the last 168 hours. BNP (last 3 results) No results for input(s): PROBNP in the last 8760 hours. CBG: No results for input(s): GLUCAP in the last 168 hours. D-Dimer: No results for input(s): DDIMER in  the last 72 hours. Hgb A1c: No results for input(s): HGBA1C in the last 72 hours. Lipid Profile: No results for input(s): CHOL, HDL, LDLCALC, TRIG, CHOLHDL, LDLDIRECT in the last 72 hours. Thyroid function studies: No results for input(s): TSH, T4TOTAL, T3FREE, THYROIDAB in the last 72 hours.  Invalid input(s): FREET3 Anemia work up: No results for input(s): VITAMINB12, FOLATE, FERRITIN, TIBC, IRON, RETICCTPCT in the last 72 hours. Sepsis Labs: Recent Labs  Lab 01/01/20 1136 01/03/20 1325 01/04/20 0642 01/06/20 0813  WBC 13.2* 10.8* 7.8 10.5    Microbiology Recent Results (from the past 240 hour(s))  Novel Coronavirus, NAA ([facility])     Status: None   Collection Time: 12/31/19  6:17 PM   Specimen: Vein; Nasopharyngeal(NP) swabs in vial transport medium   NASOPHARYNGE  Result Value Ref Range Status   SARS-CoV-2, NAA Not Detected Not Detected Final    Comment: This nucleic acid amplification test was developed and its performance characteristics determined by [employer]. Nucleic acid amplification tests include RT-PCR and TMA. This test has not been FDA cleared or approved. This test has been authorized by FDA under an Emergency Use Authorization (EUA). This test is only authorized for the duration of time the declaration that circumstances exist justifying the authorization of the emergency use of in vitro diagnostic tests for detection of SARS-CoV-2 virus and/or diagnosis of COVID-19 infection under section 564(b)(1) of the Act, 21 U.S.C. [id number]) (1), unless the authorization is terminated or revoked sooner. When diagnostic testing is negative, the possibility of a false negative result should be considered in the context of a patient's recent exposures and the presence of clinical signs and symptoms consistent with COVID-19. An individual without symptoms of COVID-19 and who is not shedding SARS-CoV-2 virus wo uld expect to have a negative (not detected)  result in this assay.   SARS CORONAVIRUS 2 (TAT 6-24 HRS) Nasopharyngeal Nasopharyngeal Swab     Status: None   Collection Time: 01/03/20  5:18 PM   Specimen: Nasopharyngeal Swab  Result Value Ref Range Status   SARS Coronavirus 2 NEGATIVE NEGATIVE Final    Comment: (NOTE) SARS-CoV-2 target nucleic acids are NOT DETECTED. The SARS-CoV-2 RNA is generally detectable in upper and lower respiratory specimens during the  acute phase of infection. Negative results do not preclude SARS-CoV-2 infection, do not rule out co-infections with other pathogens, and should not be used as the sole basis for treatment or other patient management decisions. Negative results must be combined with clinical observations, patient history, and epidemiological information. The expected result is Negative. Fact Sheet for Patients: SugarRoll.be Fact Sheet for Healthcare Providers: https://www.woods-mathews.com/ This test is not yet approved or cleared by the [place] FDA and  has been authorized for detection and/or diagnosis of SARS-CoV-2 by FDA under an Emergency Use Authorization (EUA). This EUA will remain  in effect (meaning this test can be used) for the duration of the COVID-19 declaration under Section 56 4(b)(1) of the Act, 21 U.S.C. section 360bbb-3(b)(1), unless the authorization is terminated or revoked sooner. Performed at [facility], [place] [address], [place], [place] [address]   Urine Culture     Status: Abnormal   Collection Time: 01/03/20  5:48 PM   Specimen: Urine, Random  Result Value Ref Range Status   Specimen Description   Final    URINE, RANDOM Performed at [facility], [address] [place], [place] [address]    Special Requests   Final    NONE Performed at [facility], [address] [place], [place] [address]    Culture MULTIPLE SPECIES PRESENT, SUGGEST RECOLLECTION (A)  Final   Report Status  01/06/2020 FINAL  Final    Procedures and diagnostic studies:  US BIOPSY (KIDNEY)  Result Date: 01/06/2020 Anthonette Legato, MD     01/06/2020 12:00 PM After obtaining informed consent, the patient was brought down to the ultrasound suite. Subsequently the left kidney was identified under ultrasound. The left flank was prepped and draped in standard sterile fashion. Local anesthesia was achieved using 1% lidocaine. Subsequently using an 18-gauge biopsy device and ultraound guidance, a total of 3 passes were made into the left kidney. The specimens were submitted to pathology and deemed to be adequate for submission. No active bleeding noted on post-biopsy images. The patient tolerated the procedure very well. She will return to herroom for continued observation. Estimated blood loss: Minimal. Complications: No immediate complications.   Medications:   . torsemide  20 mg Oral Daily   Continuous Infusions:    LOS: 3 days   Melysa Schroyer  [facility]   *Please refer to [email or web address], password [email or web address] to get updated schedule on who will round on this patient, as hospitalists switch teams weekly. If 7PM-7AM, please contact night-coverage at [email or web address], password [email or web address] for any overnight needs.  01/06/2020, 4:50 PM

## 2020-01-06 NOTE — Progress Notes (Signed)
Patient returned to unit at 10:20am. Reports mild nausea, refused PRN Zofran.

## 2020-01-06 NOTE — Progress Notes (Signed)
Patient left unit for renal biopsy at 0915.

## 2020-01-07 ENCOUNTER — Inpatient Hospital Stay: Payer: Medicaid Other

## 2020-01-07 LAB — MISC LABCORP TEST (SEND OUT): Labcorp test code: 141330

## 2020-01-07 LAB — BASIC METABOLIC PANEL
Anion gap: 4 — ABNORMAL LOW (ref 5–15)
BUN: 47 mg/dL — ABNORMAL HIGH (ref 6–20)
CO2: 27 mmol/L (ref 22–32)
Calcium: 7.6 mg/dL — ABNORMAL LOW (ref 8.9–10.3)
Chloride: 102 mmol/L (ref 98–111)
Creatinine, Ser: 2.35 mg/dL — ABNORMAL HIGH (ref 0.44–1.00)
GFR calc Af Amer: 29 mL/min — ABNORMAL LOW (ref >60)
GFR calc non Af Amer: 25 mL/min — ABNORMAL LOW (ref >60)
Glucose, Bld: 107 mg/dL — ABNORMAL HIGH (ref 70–99)
Potassium: 4.8 mmol/L (ref 3.5–5.1)
Sodium: 133 mmol/L — ABNORMAL LOW (ref 135–145)

## 2020-01-07 MED ORDER — TORSEMIDE 20 MG PO TABS: 20.0000 mg | ORAL_TABLET | Freq: Every day | ORAL | Status: DC

## 2020-01-07 MED ORDER — CLONIDINE HCL 0.1 MG PO TABS: 0.1000 mg | ORAL_TABLET | Freq: Every day | ORAL | 0 refills | Status: DC | PRN

## 2020-01-07 MED ORDER — KETOROLAC TROMETHAMINE 30 MG/ML IJ SOLN: 30.0000 mg | Freq: Once | INTRAMUSCULAR | Status: DC

## 2020-01-07 MED ORDER — ONE-DAILY MULTI VITAMINS PO TABS: 1.0000 | ORAL_TABLET | Freq: Every day | ORAL | Status: AC

## 2020-01-07 MED ORDER — DIPHENHYDRAMINE HCL 50 MG/ML IJ SOLN
25.0000 mg | Freq: Once | INTRAMUSCULAR | Status: AC
  Administered 2020-01-07: 25 mg via INTRAVENOUS
  Filled 2020-01-07: qty 1

## 2020-01-07 MED ORDER — MAGNESIUM SULFATE 2 GM/50ML IV SOLN
2.0000 g | Freq: Once | INTRAVENOUS | Status: AC
  Administered 2020-01-07: 02:00:00 2 g via INTRAVENOUS
  Filled 2020-01-07: qty 50

## 2020-01-07 NOTE — Progress Notes (Signed)
[facility]  ROUNDING NOTE   Subjective:  Patient developed a severe headache earlier this a.m. Blood pressure was elevated at that time at 185/103. Patient underwent CT scan of the head which was negative. This a.m. her headache is better but not fully gone yet. Blood pressure normal. Hemoglobin also appears to be stable.  Objective:  Vital signs in last 24 hours:  Temp:  [97.8 F (36.6 C)-98.7 F (37.1 C)] 98.7 F (37.1 C) (01/25 2134) Pulse Rate:  [60-76] 60 (01/26 0244) Resp:  [16-18] 18 (01/25 2300) BP: (130-182)/(83-106) 130/83 (01/26 0244) SpO2:  [99 %-100 %] 100 % (01/25 2134) Weight:  [81.2 kg] 81.2 kg (01/26 0935)  Weight change:  Filed Weights   01/05/20 0500 01/05/20 1130 01/07/20 0935  Weight: 82.6 kg 78.8 kg 81.2 kg    Intake/Output: I/O last 3 completed shifts: In: 327.5 [P.O.:300; IV Piggyback:27.5] Out: 1480 [Urine:1480]   Intake/Output this shift:  Total I/O In: 240 [P.O.:240] Out: 300 [Urine:300]  Physical Exam: General: No acute distress  Head: Normocephalic, atraumatic. Moist oral mucosal membranes  Eyes: Mild periorbital edema  Neck: Supple, trachea midline  Lungs:  Clear to auscultation, normal effort  Heart: S1S2 no rubs  Abdomen:  Soft, nontender, bowel sounds present  Extremities: 1+ peripheral edema.  Neurologic: Awake, alert, following commands  Skin: No lesions       Basic Metabolic Panel: Recent Labs  Lab 01/03/20 1325 01/03/20 1325 01/04/20 0642 01/04/20 0642 01/05/20 0353 01/06/20 0449 01/07/20 0936  NA 137  --  138  --  134* 134* 133*  K 4.0  --  4.3  --  4.1 4.3 4.8  CL 104  --  107  --  106 107 102  CO2 24  --  23  --  24 24 27   GLUCOSE 99  --  85  --  88 93 107*  BUN 32*  --  36*  --  42* 41* 47*  CREATININE 1.74*  --  2.19*  --  1.61* 1.57* 2.35*  CALCIUM 7.8*   < > 7.3*   < > 7.2* 7.4* 7.6*   < > = values in this interval not displayed.    Liver Function Tests: Recent Labs  Lab  12/31/19 1817 01/01/20 1136 01/03/20 1325  AST 15 21 21   ALT 13 16 17   ALKPHOS 59 45 49  BILITOT 0.2 0.7 0.7  PROT 4.8* 5.3* 5.5*  ALBUMIN 2.6* 2.4* 2.4*   Recent Labs  Lab 01/01/20 1136 01/03/20 1325  LIPASE 26 32   No results for input(s): AMMONIA in the last 168 hours.  CBC: Recent Labs  Lab 12/31/19 1817 01/01/20 1136 01/03/20 1325 01/04/20 0642 01/06/20 0813  WBC 9.7 13.2* 10.8* 7.8 10.5  NEUTROABS  --   --   --  4.9 6.5  HGB 13.4 14.2 13.9 13.0 14.2  HCT 39.6 41.7 40.6 38.0 41.6  MCV 94 91.0 89.8 90.7 89.1  PLT 183 191 201 187 210    Cardiac Enzymes: No results for input(s): CKTOTAL, CKMB, CKMBINDEX, TROPONINI in the last 168 hours.  BNP: Invalid input(s): POCBNP  CBG: No results for input(s): GLUCAP in the last 168 hours.  Microbiology: Results for orders placed or performed during the hospital encounter of 01/03/20  SARS CORONAVIRUS 2 (TAT 6-24 HRS) Nasopharyngeal Nasopharyngeal Swab     Status: None   Collection Time: 01/03/20  5:18 PM   Specimen: Nasopharyngeal Swab  Result Value Ref Range Status   SARS Coronavirus  2 NEGATIVE NEGATIVE Final    Comment: (NOTE) SARS-CoV-2 target nucleic acids are NOT DETECTED. The SARS-CoV-2 RNA is generally detectable in upper and lower respiratory specimens during the acute phase of infection. Negative results do not preclude SARS-CoV-2 infection, do not rule out co-infections with other pathogens, and should not be used as the sole basis for treatment or other patient management decisions. Negative results must be combined with clinical observations, patient history, and epidemiological information. The expected result is Negative. Fact Sheet for Patients: SugarRoll.be Fact Sheet for Healthcare Providers: https://www.woods-mathews.com/ This test is not yet approved or cleared by the [place] FDA and  has been authorized for detection and/or diagnosis of  SARS-CoV-2 by FDA under an Emergency Use Authorization (EUA). This EUA will remain  in effect (meaning this test can be used) for the duration of the COVID-19 declaration under Section 56 4(b)(1) of the Act, 21 U.S.C. section 360bbb-3(b)(1), unless the authorization is terminated or revoked sooner. Performed at [facility], [place] [address] [place], [place] [address]   Urine Culture     Status: Abnormal   Collection Time: 01/03/20  5:48 PM   Specimen: Urine, Random  Result Value Ref Range Status   Specimen Description   Final    URINE, RANDOM Performed at [facility], [address], [place],  [address]    Special Requests   Final    NONE Performed at [facility], [address] [place],  [address]    Culture MULTIPLE SPECIES PRESENT, SUGGEST RECOLLECTION (A)  Final   Report Status 01/06/2020 FINAL  Final    Coagulation Studies: Recent Labs    01/06/20 0813  LABPROT 12.4  INR 0.9    Urinalysis: No results for input(s): COLORURINE, LABSPEC, PHURINE, GLUCOSEU, HGBUR, BILIRUBINUR, KETONESUR, PROTEINUR, UROBILINOGEN, NITRITE, LEUKOCYTESUR in the last 72 hours.  Invalid input(s): APPERANCEUR    Imaging: CT HEAD WO CONTRAST  Result Date: 01/07/2020 CLINICAL DATA:  Initial evaluation for acute severe headache. EXAM: CT HEAD WITHOUT CONTRAST TECHNIQUE: Contiguous axial images were obtained from the base of the skull through the vertex without intravenous contrast. COMPARISON:  None. FINDINGS: Brain: Cerebral volume within normal limits for patient age. No evidence for acute intracranial hemorrhage. No findings to suggest acute large vessel territory infarct. No mass lesion, midline shift, or mass effect. Ventricles are normal in size without evidence for hydrocephalus. No extra-axial fluid collection identified. Vascular: No hyperdense vessel identified. Skull: Scalp soft tissues demonstrate no acute abnormality. Calvarium intact.  Sinuses/Orbits: Globes and orbital soft tissues within normal limits. Mild scattered mucosal thickening noted within the ethmoidal air cells. Paranasal sinuses are otherwise clear. No mastoid effusion. IMPRESSION: Negative head CT.  No acute intracranial abnormality. Electronically Signed   By: Jeannine Boga M.D.   On: 01/07/2020 02:14   US BIOPSY (KIDNEY)  Result Date: 01/06/2020 Anthonette Legato, MD     01/06/2020 12:00 PM After obtaining informed consent, the patient was brought down to the ultrasound suite. Subsequently the left kidney was identified under ultrasound. The left flank was prepped and draped in standard sterile fashion. Local anesthesia was achieved using 1% lidocaine. Subsequently using an 18-gauge biopsy device and ultraound guidance, a total of 3 passes were made into the left kidney. The specimens were submitted to pathology and deemed to be adequate for submission. No active bleeding noted on post-biopsy images. The patient tolerated the procedure very well. She will return to herroom for continued observation. Estimated blood loss: Minimal. Complications:  No immediate complications.    Medications:    . torsemide  20 mg Oral Daily   acetaminophen **OR** acetaminophen, ALPRAZolam, HYDROmorphone (DILAUDID) injection, ondansetron (ZOFRAN) IV, oxyCODONE  Assessment/ Plan:  39 y.o. female with past medical history of IBS, ADHD, depression who was recently evaluated by our practice for new onset nephrotic syndrome.  Work up: Renal ultrasound shows mild bilateral echogenicity, negative ANA/ANCA/hepatitis serologies/HIV.  Normal complements.  Patient did recently start vaping.  Renal biopsy perforemd 01/06/20, 3 cores obtained. Sent to [facility]  1.  Proteinuria, nephrotic range.  2.  Acute kidney injury. 3.  Hypertension.  Plan: Patient did develop a severe headache last night with a blood pressure 185/103.  Blood pressure now significantly improved.   Renal biopsy was successfully performed yesterday and 3 renal cores were obtained and sent to [facility] nephro pathology.  Hopefully we will have a preliminary result tomorrow.  For now she can be maintained on torsemide 20 mg daily.  We have also provided her clonidine 0.1 mg to be used on an as-needed basis.  She may be having a side effect of diuretic therapy.  Patient may be developing temporary intravascular volume depletion which could be triggering her headaches.  For now we have also advised her to avoid any heavy lifting greater than 5 pounds for the next 2 weeks and to avoid NSAIDs.  She verbalized understanding of this.  My partner Dr. Juleen China will see her back in the office within 1 week.    LOS: 4 Kendan Cornforth 1/26/202110:50 AM

## 2020-01-07 NOTE — Progress Notes (Signed)
Complains of severe headache on top and frontal. BP 185/103, pulse 72; complained of brown pee.  Patient had renal biopsy yesterday. Requesting MD visit ASAP. Secure chat sent to Ouma,NP. Waiting for response. Barbaraann Faster, RN 12:44 AM 01/07/2020

## 2020-01-07 NOTE — Progress Notes (Signed)
Back in room 204 via bed; stated that her headache was a little better, but she was still nauseated. Barbaraann Faster, RN 2:05 AM 01/07/2020

## 2020-01-07 NOTE — Progress Notes (Signed)
E. Ouma,NP at bedside. BP 167/91, 76; IV team consult placed for new IV site d/t "burning"; New orders written. Barbaraann Faster, RN; 1:05 AM 01/07/2020

## 2020-01-07 NOTE — Progress Notes (Signed)
Instructed by E. Ouma, NP to give Dilaudid with the benadryl. For head CT. Barbaraann Faster, RN 1:26 AM 01/07/2020

## 2020-01-07 NOTE — Discharge Summary (Signed)
Physician Discharge Summary  Christine Rice [id number] DOB: Jan 08, 1981 DOA: 01/03/2020  PCP: Abner Greenspan, MD  Admit date: 01/03/2020 Discharge date: 01/07/2020  Discharge disposition: Home   Recommendations for Outpatient Follow-Up:    Follow up with nephrologist  Discharge Diagnosis:   Active Problems:   AKI (acute kidney injury) ([place])   Intractable vomiting with nausea    Discharge Condition: Stable.  Diet recommendation: Low salt diet  Code status: Full code.    Hospital Course:   Christine Rice is an 39 y.o. female with newly diagnosed nephrotic range proteinuria/nephrotic syndrome, who presented to the hospital with nausea and vomiting.  She was found to have acute kidney injury.  She was treated with IV fluids and creatinine improved.  She was seen in consultation by the nephrologist and she underwent renal biopsy.  While inpatient, she developed severe headache so CT head was done but there was no acute abnormality noted.  Her blood pressure was also significantly elevated.  Her condition improved and she was deemed stable for discharge to home.  Discharge plan was discussed with nephrologist, Dr. Holley Raring.  He recommended that patient be discharged home torsemide and clonidine as needed for severe hypertension.       Discharge Exam:   Vitals:   01/07/20 0102 01/07/20 0244  BP: (!) 167/91 130/83  Pulse: 76 60  Resp:    Temp:    SpO2:     Vitals:   01/06/20 2300 01/07/20 0055 01/07/20 0102 01/07/20 0244  BP:  (!) 182/106 (!) 167/91 130/83  Pulse:  72 76 60  Resp: 18     Temp:      TempSrc:      SpO2:      Weight:      Height:         GEN: NAD SKIN: No rash EYES: EOMI ENT: MMM CV: RRR PULM: CTA B ABD: soft, distended, NT, +BS CNS: AAO x 3, non focal EXT: B/l leg and pedal edema but no erythema or tenderness   The results of significant diagnostics from this hospitalization (including imaging, microbiology, ancillary and laboratory)  are listed below for reference.     Procedures and Diagnostic Studies:   US RENAL  Result Date: 01/03/2020 CLINICAL DATA:  Proteinuria.  Acute renal failure. EXAM: RENAL / URINARY TRACT ULTRASOUND COMPLETE COMPARISON:  None. FINDINGS: Right Kidney: Renal measurements: 13.6 x 5.8 x 4.8 cm = volume: 201 mL. Minimally increased echogenicity of renal parenchyma is noted. No mass or hydronephrosis visualized. Left Kidney: Renal measurements: 12.3 x 5.6 x 5.4 cm = volume: 195 mL. Minimally increased echogenicity of renal parenchyma is noted. No mass or hydronephrosis visualized. Bladder: Appears normal for degree of bladder distention. Other: None. IMPRESSION: Minimally increased echogenicity of renal parenchyma is noted bilaterally suggesting medical renal disease. No hydronephrosis or renal obstruction is noted. Electronically Signed   By: Marijo Conception M.D.   On: 01/03/2020 10:59   DG Abd 2 Views  Result Date: 01/03/2020 CLINICAL DATA:  Nausea and vomiting EXAM: ABDOMEN - 2 VIEW COMPARISON:  None. FINDINGS: Tiny pleural effusions. Mild airspace disease at the left base. No free air beneath the diaphragm. Nonobstructed bowel-gas pattern. Few air-filled central small bowel loops with a few fluid levels but no distension. No radiopaque calculi. IUD in the pelvis. IMPRESSION: 1. Nonobstructed gas pattern 2. Small pleural effusions and mild left basilar airspace disease, atelectasis versus pneumonia Electronically Signed   By: Madie Reno.D.  On: 01/03/2020 17:15     Labs:   Basic Metabolic Panel: Recent Labs  Lab 01/01/20 1136 01/01/20 1136 01/03/20 1325 01/03/20 1325 01/04/20 0642 01/04/20 0642 01/05/20 0353 01/06/20 0449  NA 137  --  137  --  138  --  134* 134*  K 4.4   < > 4.0   < > 4.3   < > 4.1 4.3  CL 106  --  104  --  107  --  106 107  CO2 25  --  24  --  23  --  24 24  GLUCOSE 95  --  99  --  85  --  88 93  BUN 22*  --  32*  --  36*  --  42* 41*  CREATININE 1.11*  --  1.74*   --  2.19*  --  1.61* 1.57*  CALCIUM 8.0*  --  7.8*  --  7.3*  --  7.2* 7.4*   < > = values in this interval not displayed.   GFR Estimated Creatinine Clearance: 51.5 mL/min (A) (by C-G formula based on SCr of 1.57 mg/dL (H)). Liver Function Tests: Recent Labs  Lab 12/31/19 1817 01/01/20 1136 01/03/20 1325  AST 15 21 21   ALT 13 16 17   ALKPHOS 59 45 49  BILITOT 0.2 0.7 0.7  PROT 4.8* 5.3* 5.5*  ALBUMIN 2.6* 2.4* 2.4*   Recent Labs  Lab 01/01/20 1136 01/03/20 1325  LIPASE 26 32   No results for input(s): AMMONIA in the last 168 hours. Coagulation profile Recent Labs  Lab 01/06/20 0813  INR 0.9    CBC: Recent Labs  Lab 12/31/19 1817 01/01/20 1136 01/03/20 1325 01/04/20 0642 01/06/20 0813  WBC 9.7 13.2* 10.8* 7.8 10.5  NEUTROABS  --   --   --  4.9 6.5  HGB 13.4 14.2 13.9 13.0 14.2  HCT 39.6 41.7 40.6 38.0 41.6  MCV 94 91.0 89.8 90.7 89.1  PLT 183 191 201 187 210   Cardiac Enzymes: No results for input(s): CKTOTAL, CKMB, CKMBINDEX, TROPONINI in the last 168 hours. BNP: Invalid input(s): POCBNP CBG: No results for input(s): GLUCAP in the last 168 hours. D-Dimer No results for input(s): DDIMER in the last 72 hours. Hgb A1c No results for input(s): HGBA1C in the last 72 hours. Lipid Profile No results for input(s): CHOL, HDL, LDLCALC, TRIG, CHOLHDL, LDLDIRECT in the last 72 hours. Thyroid function studies No results for input(s): TSH, T4TOTAL, T3FREE, THYROIDAB in the last 72 hours.  Invalid input(s): FREET3 Anemia work up No results for input(s): VITAMINB12, FOLATE, FERRITIN, TIBC, IRON, RETICCTPCT in the last 72 hours. Microbiology Recent Results (from the past 240 hour(s))  Novel Coronavirus, NAA ([facility])     Status: None   Collection Time: 12/31/19  6:17 PM   Specimen: Vein; Nasopharyngeal(NP) swabs in vial transport medium   NASOPHARYNGE  Result Value Ref Range Status   SARS-CoV-2, NAA Not Detected Not Detected Final    Comment: This nucleic acid  amplification test was developed and its performance characteristics determined by [employer]. Nucleic acid amplification tests include RT-PCR and TMA. This test has not been FDA cleared or approved. This test has been authorized by FDA under an Emergency Use Authorization (EUA). This test is only authorized for the duration of time the declaration that circumstances exist justifying the authorization of the emergency use of in vitro diagnostic tests for detection of SARS-CoV-2 virus and/or diagnosis of COVID-19 infection under section 564(b)(1) of the Act, 21  U.S.C. [id number]) (1), unless the authorization is terminated or revoked sooner. When diagnostic testing is negative, the possibility of a false negative result should be considered in the context of a patient's recent exposures and the presence of clinical signs and symptoms consistent with COVID-19. An individual without symptoms of COVID-19 and who is not shedding SARS-CoV-2 virus wo uld expect to have a negative (not detected) result in this assay.   SARS CORONAVIRUS 2 (TAT 6-24 HRS) Nasopharyngeal Nasopharyngeal Swab     Status: None   Collection Time: 01/03/20  5:18 PM   Specimen: Nasopharyngeal Swab  Result Value Ref Range Status   SARS Coronavirus 2 NEGATIVE NEGATIVE Final    Comment: (NOTE) SARS-CoV-2 target nucleic acids are NOT DETECTED. The SARS-CoV-2 RNA is generally detectable in upper and lower respiratory specimens during the acute phase of infection. Negative results do not preclude SARS-CoV-2 infection, do not rule out co-infections with other pathogens, and should not be used as the sole basis for treatment or other patient management decisions. Negative results must be combined with clinical observations, patient history, and epidemiological information. The expected result is Negative. Fact Sheet for Patients: SugarRoll.be Fact Sheet for Healthcare  Providers: https://www.woods-mathews.com/ This test is not yet approved or cleared by the [place] FDA and  has been authorized for detection and/or diagnosis of SARS-CoV-2 by FDA under an Emergency Use Authorization (EUA). This EUA will remain  in effect (meaning this test can be used) for the duration of the COVID-19 declaration under Section 56 4(b)(1) of the Act, 21 U.S.C. section 360bbb-3(b)(1), unless the authorization is terminated or revoked sooner. Performed at [facility], [place] [address], [place], [place] [address]   Urine Culture     Status: Abnormal   Collection Time: 01/03/20  5:48 PM   Specimen: Urine, Random  Result Value Ref Range Status   Specimen Description   Final    URINE, RANDOM Performed at [facility], [address] [place], [place] [address]    Special Requests   Final    NONE Performed at [facility], [address] [place], [place] [address]    Culture MULTIPLE SPECIES PRESENT, SUGGEST RECOLLECTION (A)  Final   Report Status 01/06/2020 FINAL  Final     Discharge Instructions:   Discharge Instructions    Diet - low sodium heart healthy   Complete by: As directed    Increase activity slowly   Complete by: As directed    Other Restrictions   Complete by: As directed    No lifting of heavy weight more than 5 pounds for 2 weeks. No Aspirin or NSAIDs for 2 weeks     Allergies as of 01/07/2020      Reactions   Amoxicillin Hives   REACTION: rash   Penicillins Hives      Medication List    STOP taking these medications   furosemide 20 MG tablet Commonly known as: Lasix     TAKE these medications   ALPRAZolam 0.25 MG tablet Commonly known as: XANAX Take 1 tablet (0.25 mg total) by mouth daily as needed for anxiety.   cloNIDine 0.1 MG tablet Commonly known as: Catapres Take 1 tablet (0.1 mg total) by mouth daily as needed (systolic BP greater than Q000111Q).   levonorgestrel 20 MCG/24HR  IUD Commonly known as: MIRENA 1 each by Intrauterine route once.   multivitamin tablet Take 1 tablet by mouth daily. What changed: when to take this   torsemide 20  MG tablet Commonly known as: DEMADEX Take 1 tablet (20 mg total) by mouth daily. Start taking on: January 08, 2020         Time coordinating discharge: 28 minutes  Signed:  Jennye Boroughs  [facility] 01/07/2020, 9:15 AM

## 2020-01-07 NOTE — Progress Notes (Signed)
To CT via bed with orderly and RN.Barbaraann Faster, RN 01/07/2020

## 2020-01-07 NOTE — Progress Notes (Signed)
Christine Rice  A and O x 4. VSS. Pt tolerating diet well. No complaints of pain or nausea. IV removed intact, prescriptions given. Pt voiced understanding of discharge instructions with no further questions. Pt discharged via wheelchair with NT.    Allergies as of 01/07/2020      Reactions   Amoxicillin Hives   REACTION: rash   Penicillins Hives      Medication List    STOP taking these medications   furosemide 20 MG tablet Commonly known as: Lasix     TAKE these medications   ALPRAZolam 0.25 MG tablet Commonly known as: XANAX Take 1 tablet (0.25 mg total) by mouth daily as needed for anxiety.   cloNIDine 0.1 MG tablet Commonly known as: Catapres Take 1 tablet (0.1 mg total) by mouth daily as needed (systolic BP greater than Q000111Q).   levonorgestrel 20 MCG/24HR IUD Commonly known as: MIRENA 1 each by Intrauterine route once.   multivitamin tablet Take 1 tablet by mouth daily. What changed: when to take this   torsemide 20 MG tablet Commonly known as: DEMADEX Take 1 tablet (20 mg total) by mouth daily. Start taking on: January 08, 2020       Vitals:   01/07/20 0102 01/07/20 0244  BP: (!) 167/91 130/83  Pulse: 76 60  Resp:    Temp:    SpO2:      Francesco Sor

## 2020-01-07 NOTE — Progress Notes (Signed)
C/O feeling hot, pain in shoulders, "woozy"; nauseated; Down to CT via bed with RN. Barbaraann Faster, RN; 1:33 AM 01/07/2020

## 2020-01-10 ENCOUNTER — Other Ambulatory Visit: Payer: Self-pay

## 2020-01-10 ENCOUNTER — Emergency Department: Payer: Medicaid Other

## 2020-01-10 ENCOUNTER — Encounter: Payer: Self-pay | Admitting: Emergency Medicine

## 2020-01-10 ENCOUNTER — Inpatient Hospital Stay
Admission: EM | Admit: 2020-01-10 | Discharge: 2020-01-16 | DRG: 699 | Disposition: A | Discharge status: Home / Self Care | Payer: Medicaid Other | Attending: Student | Admitting: Student

## 2020-01-10 DIAGNOSIS — Z87441 Personal history of nephrotic syndrome: Secondary | ICD-10-CM

## 2020-01-10 DIAGNOSIS — E860 Dehydration: Secondary | ICD-10-CM | POA: Diagnosis present

## 2020-01-10 DIAGNOSIS — R601 Generalized edema: Secondary | ICD-10-CM | POA: Diagnosis present

## 2020-01-10 DIAGNOSIS — Z881 Allergy status to other antibiotic agents status: Secondary | ICD-10-CM

## 2020-01-10 DIAGNOSIS — Z87891 Personal history of nicotine dependence: Secondary | ICD-10-CM | POA: Diagnosis not present

## 2020-01-10 DIAGNOSIS — N050 Unspecified nephritic syndrome with minor glomerular abnormality: Secondary | ICD-10-CM

## 2020-01-10 DIAGNOSIS — K589 Irritable bowel syndrome without diarrhea: Secondary | ICD-10-CM | POA: Diagnosis present

## 2020-01-10 DIAGNOSIS — U070 Vaping-related disorder: Secondary | ICD-10-CM | POA: Diagnosis present

## 2020-01-10 DIAGNOSIS — I129 Hypertensive chronic kidney disease with stage 1 through stage 4 chronic kidney disease, or unspecified chronic kidney disease: Secondary | ICD-10-CM | POA: Diagnosis present

## 2020-01-10 DIAGNOSIS — Z79899 Other long term (current) drug therapy: Secondary | ICD-10-CM

## 2020-01-10 DIAGNOSIS — F419 Anxiety disorder, unspecified: Secondary | ICD-10-CM | POA: Diagnosis present

## 2020-01-10 DIAGNOSIS — E8809 Other disorders of plasma-protein metabolism, not elsewhere classified: Secondary | ICD-10-CM | POA: Diagnosis present

## 2020-01-10 DIAGNOSIS — R519 Headache, unspecified: Secondary | ICD-10-CM | POA: Diagnosis not present

## 2020-01-10 DIAGNOSIS — Z825 Family history of asthma and other chronic lower respiratory diseases: Secondary | ICD-10-CM | POA: Diagnosis not present

## 2020-01-10 DIAGNOSIS — I160 Hypertensive urgency: Secondary | ICD-10-CM | POA: Diagnosis present

## 2020-01-10 DIAGNOSIS — E872 Acidosis: Secondary | ICD-10-CM | POA: Diagnosis present

## 2020-01-10 DIAGNOSIS — N179 Acute kidney failure, unspecified: Secondary | ICD-10-CM | POA: Diagnosis present

## 2020-01-10 DIAGNOSIS — G8929 Other chronic pain: Secondary | ICD-10-CM | POA: Diagnosis present

## 2020-01-10 DIAGNOSIS — N040 Nephrotic syndrome with minor glomerular abnormality: Secondary | ICD-10-CM | POA: Diagnosis present

## 2020-01-10 DIAGNOSIS — E871 Hypo-osmolality and hyponatremia: Secondary | ICD-10-CM | POA: Diagnosis present

## 2020-01-10 DIAGNOSIS — F32A Depression, unspecified: Secondary | ICD-10-CM | POA: Diagnosis present

## 2020-01-10 DIAGNOSIS — Z20822 Contact with and (suspected) exposure to covid-19: Secondary | ICD-10-CM | POA: Diagnosis present

## 2020-01-10 DIAGNOSIS — R112 Nausea with vomiting, unspecified: Secondary | ICD-10-CM | POA: Diagnosis not present

## 2020-01-10 DIAGNOSIS — F909 Attention-deficit hyperactivity disorder, unspecified type: Secondary | ICD-10-CM | POA: Diagnosis present

## 2020-01-10 DIAGNOSIS — E861 Hypovolemia: Secondary | ICD-10-CM | POA: Diagnosis present

## 2020-01-10 DIAGNOSIS — Z880 Allergy status to penicillin: Secondary | ICD-10-CM

## 2020-01-10 DIAGNOSIS — D72829 Elevated white blood cell count, unspecified: Secondary | ICD-10-CM | POA: Diagnosis present

## 2020-01-10 DIAGNOSIS — N183 Chronic kidney disease, stage 3 unspecified: Secondary | ICD-10-CM | POA: Diagnosis present

## 2020-01-10 DIAGNOSIS — N049 Nephrotic syndrome with unspecified morphologic changes: Secondary | ICD-10-CM

## 2020-01-10 LAB — COMPREHENSIVE METABOLIC PANEL
ALT: 18 U/L (ref 0–44)
AST: 19 U/L (ref 15–41)
Albumin: 2 g/dL — ABNORMAL LOW (ref 3.5–5.0)
Alkaline Phosphatase: 61 U/L (ref 38–126)
Anion gap: 8 (ref 5–15)
BUN: 61 mg/dL — ABNORMAL HIGH (ref 6–20)
CO2: 21 mmol/L — ABNORMAL LOW (ref 22–32)
Calcium: 7.9 mg/dL — ABNORMAL LOW (ref 8.9–10.3)
Chloride: 101 mmol/L (ref 98–111)
Creatinine, Ser: 3.68 mg/dL — ABNORMAL HIGH (ref 0.44–1.00)
GFR calc Af Amer: 17 mL/min — ABNORMAL LOW (ref >60)
GFR calc non Af Amer: 15 mL/min — ABNORMAL LOW (ref >60)
Glucose, Bld: 116 mg/dL — ABNORMAL HIGH (ref 70–99)
Potassium: 4.7 mmol/L (ref 3.5–5.1)
Sodium: 130 mmol/L — ABNORMAL LOW (ref 135–145)
Total Bilirubin: 0.6 mg/dL (ref 0.3–1.2)
Total Protein: 5.5 g/dL — ABNORMAL LOW (ref 6.5–8.1)

## 2020-01-10 LAB — CBC
HCT: 40.9 % (ref 36.0–46.0)
Hemoglobin: 14.3 g/dL (ref 12.0–15.0)
MCH: 31.4 pg (ref 26.0–34.0)
MCHC: 35 g/dL (ref 30.0–36.0)
MCV: 89.7 fL (ref 80.0–100.0)
Platelets: 313 10*3/uL (ref 150–400)
RBC: 4.56 MIL/uL (ref 3.87–5.11)
RDW: 11.9 % (ref 11.5–15.5)
WBC: 25.1 10*3/uL — ABNORMAL HIGH (ref 4.0–10.5)
nRBC: 0 % (ref 0.0–0.2)

## 2020-01-10 LAB — RESPIRATORY PANEL BY RT PCR (FLU A&B, COVID)
Influenza A by PCR: NEGATIVE
Influenza B by PCR: NEGATIVE
SARS Coronavirus 2 by RT PCR: NEGATIVE

## 2020-01-10 LAB — LIPASE, BLOOD: Lipase: 107 U/L — ABNORMAL HIGH (ref 11–51)

## 2020-01-10 MED ORDER — ACETAMINOPHEN 325 MG PO TABS: 650.0000 mg | ORAL_TABLET | Freq: Four times a day (QID) | ORAL | Status: DC | PRN

## 2020-01-10 MED ORDER — HYDRALAZINE HCL 20 MG/ML IJ SOLN: 10.0000 mg | INTRAMUSCULAR | Status: DC | PRN

## 2020-01-10 MED ORDER — SODIUM CHLORIDE 0.9 % IV SOLN: Freq: Once | INTRAVENOUS | Status: DC

## 2020-01-10 MED ORDER — ONDANSETRON HCL 4 MG/2ML IJ SOLN: 4.0000 mg | Freq: Four times a day (QID) | INTRAMUSCULAR | Status: DC | PRN

## 2020-01-10 MED ORDER — ADULT MULTIVITAMIN W/MINERALS CH
1.0000 | ORAL_TABLET | Freq: Every day | ORAL | Status: DC
  Administered 2020-01-13 – 2020-01-16 (×4): 1 via ORAL
  Filled 2020-01-10 (×4): qty 1

## 2020-01-10 MED ORDER — HYDROMORPHONE HCL 1 MG/ML IJ SOLN
1.0000 mg | Freq: Once | INTRAMUSCULAR | Status: AC
  Administered 2020-01-10: 1 mg via INTRAVENOUS
  Filled 2020-01-10: qty 1

## 2020-01-10 MED ORDER — POLYETHYLENE GLYCOL 3350 17 G PO PACK: 17.0000 g | PACK | Freq: Every day | ORAL | Status: DC | PRN

## 2020-01-10 MED ORDER — LABETALOL HCL 5 MG/ML IV SOLN: 10.0000 mg | INTRAVENOUS | Status: DC | PRN

## 2020-01-10 MED ORDER — BISACODYL 5 MG PO TBEC: 5.0000 mg | DELAYED_RELEASE_TABLET | Freq: Every day | ORAL | Status: DC | PRN

## 2020-01-10 MED ORDER — PROMETHAZINE HCL 25 MG/ML IJ SOLN
12.5000 mg | Freq: Four times a day (QID) | INTRAMUSCULAR | Status: DC | PRN
  Administered 2020-01-10: 20:00:00 12.5 mg via INTRAVENOUS
  Filled 2020-01-10: qty 1

## 2020-01-10 MED ORDER — SODIUM CHLORIDE 0.9 % IV SOLN: INTRAVENOUS | Status: DC

## 2020-01-10 MED ORDER — METHYLPREDNISOLONE SODIUM SUCC 125 MG IJ SOLR
60.0000 mg | Freq: Every day | INTRAMUSCULAR | Status: DC
  Administered 2020-01-10 – 2020-01-15 (×6): 60 mg via INTRAVENOUS
  Filled 2020-01-10 (×6): qty 2

## 2020-01-10 MED ORDER — HEPARIN SODIUM (PORCINE) 5000 UNIT/ML IJ SOLN
5000.0000 [IU] | Freq: Two times a day (BID) | INTRAMUSCULAR | Status: DC
  Administered 2020-01-11 – 2020-01-15 (×10): 5000 [IU] via SUBCUTANEOUS
  Filled 2020-01-10 (×12): qty 1

## 2020-01-10 MED ORDER — METOCLOPRAMIDE HCL 5 MG/ML IJ SOLN
10.0000 mg | Freq: Once | INTRAMUSCULAR | Status: AC
  Administered 2020-01-10: 10 mg via INTRAVENOUS
  Filled 2020-01-10: qty 2

## 2020-01-10 MED ORDER — TRAZODONE HCL 50 MG PO TABS: 50.0000 mg | ORAL_TABLET | Freq: Every evening | ORAL | Status: DC | PRN

## 2020-01-10 MED ORDER — TRAMADOL HCL 50 MG PO TABS: 50.0000 mg | ORAL_TABLET | Freq: Two times a day (BID) | ORAL | Status: DC | PRN

## 2020-01-10 MED ORDER — ACETAMINOPHEN 10 MG/ML IV SOLN
1000.0000 mg | Freq: Four times a day (QID) | INTRAVENOUS | Status: AC | PRN
  Administered 2020-01-10 – 2020-01-11 (×2): 1000 mg via INTRAVENOUS
  Filled 2020-01-10 (×3): qty 100

## 2020-01-10 MED ORDER — SODIUM CHLORIDE 0.9 % IV BOLUS
500.0000 mL | Freq: Once | INTRAVENOUS | Status: AC
  Administered 2020-01-10: 500 mL via INTRAVENOUS

## 2020-01-10 MED ORDER — ALPRAZOLAM 0.5 MG PO TABS: 0.5000 mg | ORAL_TABLET | Freq: Three times a day (TID) | ORAL | Status: DC | PRN

## 2020-01-10 MED ORDER — ONDANSETRON HCL 4 MG/2ML IJ SOLN
4.0000 mg | Freq: Once | INTRAMUSCULAR | Status: AC
  Administered 2020-01-10: 18:00:00 4 mg via INTRAVENOUS
  Filled 2020-01-10: qty 2

## 2020-01-10 MED ORDER — ONDANSETRON HCL 4 MG PO TABS: 4.0000 mg | ORAL_TABLET | Freq: Four times a day (QID) | ORAL | Status: DC | PRN

## 2020-01-10 MED ORDER — ONDANSETRON 4 MG PO TBDP
4.0000 mg | ORAL_TABLET | Freq: Once | ORAL | Status: DC | PRN
  Filled 2020-01-10: qty 1

## 2020-01-10 MED ORDER — ACETAMINOPHEN 650 MG RE SUPP: 650.0000 mg | Freq: Four times a day (QID) | RECTAL | Status: DC | PRN

## 2020-01-10 NOTE — H&P (Addendum)
History and Physical    Christine Rice [id number] DOB: 01-Feb-1981 DOA: 01/10/2020  PCP: Abner Greenspan, MD   Patient coming from: Home  I have personally briefly reviewed patient's old medical records in [place]  Chief Complaint: intractable vomiting  HPI: Christine Rice is a 39 y.o. 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  She was admitted for similar issues from 1/22 to 1/26, discharge on Demadex and PRN oral clonidine.   She reports onset of intermittent vomiting one week ago, but it worsened today and she has been unable to tolerate any oral intake.  Tried to take Phenergan at home but no relief.  She had just been started on Prednisone for MCD yesterday.  She has been taking Demadex since onset of nephrotic syndrome and has tolerated it.  She also reports labile blood pressures at home, occasional chills but no fevers, no diarrhea but has intermittent loose stools chronically.  Denies any significant abdominal pain but has epigastric tenderness.  Denies cough, SOB, chest pain, dysuria or other complaints.  States minimal alcohol consumption.    ED Course: hypertensive 150/104 -> 165/113 otherwise stable vitals.  Labs notable for Na 130, CO2 21, BUN 61, Cr 3.68 (from 2.35 yesterday and 1.57 on 1/25), lipase 107, albumin 2.0, WBC 25k.  Flu A/B and Covid-19 negative. CT head negative on 1/26 (prior admission, obtained also for headache).  Nephrology was consulted by ED physician, recommended admission for IV steroids, BP control, and gentle fluids for AKI.  Admitted to med/surg as inpatient.  Review of Systems: As per HPI otherwise 10 point review of systems negative.    Past Medical History:  Diagnosis Date  . Anxiety   . Nephrotic syndrome     Past Surgical History:  Procedure Laterality Date  . CESAREAN SECTION  01/06/05    . CESAREAN SECTION  01/19/2012   Procedure: CESAREAN SECTION;  Surgeon: Luz Lex, MD;  Location: [facility];  Service: Gynecology;  Laterality: N/A;  repeat  . HERNIA REPAIR    . INGUINAL HERNIA REPAIR  01/19/2012   Procedure: HERNIA REPAIR INGUINAL ADULT BILATERAL;  Surgeon: Adin Hector, MD;  Location: [facility];  Service: General;  Laterality: Bilateral;  . RENAL BIOPSY, PERCUTANEOUS  01/06/2020      . tummy tuck    . UMBILICAL HERNIA REPAIR  01/19/2012   Procedure: HERNIA REPAIR UMBILICAL ADULT;  Surgeon: Adin Hector, MD;  Location: [facility];  Service: General;  Laterality: N/A;  . UMBILICAL HERNIA REPAIR  11-19-14     reports that she quit smoking about 8 weeks ago. Her smoking use included cigarettes. She has never used smokeless tobacco. She reports current alcohol use. She reports current drug use. Drug: Marijuana.  Allergies  Allergen Reactions  . Amoxicillin Hives    REACTION: rash  . Penicillins Hives    Family History  Problem Relation Age of Onset  . Rashes / Skin problems Mother   . COPD Father   . Breast cancer Neg Hx   . Cancer Neg Hx   . Stroke Neg Hx      Prior to Admission medications   Medication Sig Start Date End Date Taking? Authorizing Provider  ALPRAZolam (XANAX) 0.25 MG tablet Take 1 tablet (0.25 mg total) by mouth daily as needed for anxiety. 02/15/19   Tower, Wynelle Fanny, MD  cloNIDine (  CATAPRES) 0.1 MG tablet Take 1 tablet (0.1 mg total) by mouth daily as needed (systolic BP greater than Q000111Q). 01/07/20 02/06/20  Jennye Boroughs, MD  levonorgestrel (MIRENA) 20 MCG/24HR IUD 1 each by Intrauterine route once.    [provider]  Multiple Vitamin (MULTIVITAMIN) tablet Take 1 tablet by mouth daily. 01/07/20   Jennye Boroughs, MD  torsemide (DEMADEX) 20 MG tablet Take 1 tablet (20 mg total) by mouth daily. 01/08/20   Jennye Boroughs, MD    Physical Exam: Vitals:   01/10/20 1323 01/10/20 1324 01/10/20 1738 01/10/20 1800  BP: (!) 150/104  (!) 165/113 (!)  144/76  Pulse: 70  75 64  Resp: 20     Temp: 97.6 F (36.4 C)     TempSrc: Oral     SpO2: 100%  98% 98%  Weight:  81.2 kg    Height:  5\' 6"  (1.676 m)      Constitutional: NAD, calm, ill-appearing, lights off in room and face covered, conversational, pleasant Eyes: EOMI, lids and conjunctivae normal ENMT: Mucous membranes are moist. Normal dentition. Hearing grossly normal Respiratory: clear to auscultation bilaterally, no wheezing, no crackles. Normal respiratory effort. No accessory muscle use.  Cardiovascular: Regular rate and rhythm, no murmurs / rubs / gallops. 2+ lower extremity pitting edema bilaterally.  Abdomen: isolated epigastric tenderness, no guarding or rebound, mildly distended, no masses palpated. No hepatosplenomegaly. Bowel sounds positive.  Musculoskeletal: no clubbing / cyanosis. No joint deformity upper and lower extremities.  Skin: dry, intact, normal temperature Neurologic: CN 2-12 grossly intact. Normal speech.  No gross focal motor deficits.  Psychiatric: Normal judgment and insight. Alert and oriented x 3. Normal mood.     Labs on Admission: I have personally reviewed following labs and imaging studies  CBC: Recent Labs  Lab 01/04/20 0642 01/06/20 0813 01/10/20 1325  WBC 7.8 10.5 25.1*  NEUTROABS 4.9 6.5  --   HGB 13.0 14.2 14.3  HCT 38.0 41.6 40.9  MCV 90.7 89.1 89.7  PLT 187 210 Q000111Q   Basic Metabolic Panel: Recent Labs  Lab 01/04/20 0642 01/05/20 0353 01/06/20 0449 01/07/20 0936 01/10/20 1325  NA 138 134* 134* 133* 130*  K 4.3 4.1 4.3 4.8 4.7  CL 107 106 107 102 101  CO2 23 24 24 27  21*  GLUCOSE 85 88 93 107* 116*  BUN 36* 42* 41* 47* 61*  CREATININE 2.19* 1.61* 1.57* 2.35* 3.68*  CALCIUM 7.3* 7.2* 7.4* 7.6* 7.9*   GFR: Estimated Creatinine Clearance: 22.3 mL/min (A) (by C-G formula based on SCr of 3.68 mg/dL (H)). Liver Function Tests: Recent Labs  Lab 01/10/20 1325  AST 19  ALT 18  ALKPHOS 61  BILITOT 0.6  PROT 5.5*    ALBUMIN 2.0*   Recent Labs  Lab 01/10/20 1325  LIPASE 107*   No results for input(s): AMMONIA in the last 168 hours. Coagulation Profile: Recent Labs  Lab 01/06/20 0813  INR 0.9   Cardiac Enzymes: No results for input(s): CKTOTAL, CKMB, CKMBINDEX, TROPONINI in the last 168 hours. BNP (last 3 results) No results for input(s): PROBNP in the last 8760 hours. HbA1C: No results for input(s): HGBA1C in the last 72 hours. CBG: No results for input(s): GLUCAP in the last 168 hours. Lipid Profile: No results for input(s): CHOL, HDL, LDLCALC, TRIG, CHOLHDL, LDLDIRECT in the last 72 hours. Thyroid Function Tests: No results for input(s): TSH, T4TOTAL, FREET4, T3FREE, THYROIDAB in the last 72 hours. Anemia Panel: No results for input(s): VITAMINB12, FOLATE, FERRITIN,  TIBC, IRON, RETICCTPCT in the last 72 hours. Urine analysis:    Component Value Date/Time   COLORURINE YELLOW (A) 01/03/2020 1742   APPEARANCEUR CLOUDY (A) 01/03/2020 1742   LABSPEC 1.030 01/03/2020 1742   PHURINE 5.0 01/03/2020 1742   GLUCOSEU NEGATIVE 01/03/2020 1742   HGBUR SMALL (A) 01/03/2020 1742   HGBUR moderate 04/20/2009 1606   BILIRUBINUR NEGATIVE 01/03/2020 1742   BILIRUBINUR neg 02/16/2018 1559   KETONESUR NEGATIVE 01/03/2020 1742   PROTEINUR >=300 (A) 01/03/2020 1742   UROBILINOGEN 0.2 02/16/2018 1559   UROBILINOGEN 0.2 04/20/2009 1606   NITRITE NEGATIVE 01/03/2020 1742   LEUKOCYTESUR NEGATIVE 01/03/2020 1742    Radiological Exams on Admission: No results found.    Assessment/Plan Principal Problem:   Acute kidney injury ([place]) Active Problems:   Intractable vomiting with nausea   Hypertensive urgency   Headache   Hyponatremia   Nephrotic syndrome, minimal change   Leukocytosis   Hypoalbuminemia   Anxiety disorder   Acute Kidney Injury - present on admission, superimposed on nephrotic syndrome.  Most likely pre-renal given vomiting and secondary dehyrdation Minimal Change Disease /  Nephrotic Syndrome --Nephrology following --gentle IV hydration for AKI --IV steroids per nephro --hold diuretics for now, resume as soon as feasible --daily BMP's   Hypertensive Urgency - present on admission Had been discharged with PRN oral clonidine for uncontrolled BP, but unable to tolerate PO intake.   Suspect this may be causing her headache. --Hydralazine and/or Labetalol IV PRN --may need to be started on scheduled med, will monitor  Intractable nausea/vomiting -  Unclear cause at this point.  Unclear if related to new medications but doubtful as onset of N/V was before starting Prednisone, and she said she has tolerated Demadex without issues before N/V started.  Lipase mildly elevated and with epigastric tenderness, could have mild case of acute pancreatitis. --IV fluids as above, per nephro --IV Zofran and/or Phenergan PRN  Headache - suspect due to uncontrolled hypertension.  Can't take oral meds, did not tolerate dilaudid in the ED, and due to AKI will avoid Toradol.   --acetaminophen IV PRN --cool compresses PRN, room dark, quiet environment --this may improve once her BP is controlled --consult neurology if not better with BP control  Hyponatremia, mild, Na 130 on admission.  Likely hypovolemic in setting of N/V. Expect improvement with above measures.   --daily BMP's  Non-anion gap metabolic acidosis - most likely due to N/V --monitor   Hypoalbuminemia - secondary to MCD  Leukocytosis - WBC 25k on admission, no evidence of infection.   Suspect reactive in setting in N/V in addition to steroids.  Monitor CBC.  Anxiety Disorder - continue home Xanax PRN   DVT prophylaxis: heparin Code Status: Full Family Communication: none at bedside Disposition Plan: Expect d/c home pending improvement and clearance by nephrology Consults called: Nephrology, Dr. Holley Raring Admission status: inpatient   Severity of Illness: The appropriate patient status for this patient  is INPATIENT. Inpatient status is judged to be reasonable and necessary in order to provide the required intensity of service to ensure the patient's safety and prevent clinical deterioration, morbidity or mortality.  It is not anticipated that the patient will be medically stable for discharge from the hospital within 2 midnights of admission. The following factors support the patient status of inpatient.    "           The patient's presenting symptoms include severe headache, intractable nausea/vomiting. "  The worrisome physical exam findings include photophobia, abdominal tenderness. "           The initial radiographic and laboratory data are worrisome because of acute renal failure on top of nephrotic syndrome, metabolic acidosis, elevated lipase, leukocytosis, hyponatremia. "           The chronic co-morbidities include minimal change disease/nephrotic syndrome.     * I certify that at the point of admission it is my clinical judgment that the patient will require inpatient hospital care spanning beyond 2 midnights from the point of admission due to high intensity of service, high risk for further deterioration and high frequency of surveillance required.*    Ezekiel Slocumb, DO [facility]   If 7PM-7AM, please contact night-coverage [email or web address]  01/10/2020, 6:50 PM

## 2020-01-10 NOTE — ED Provider Notes (Signed)
[facility] Emergency Department Provider Note       Time seen: ----------------------------------------- 3:06 PM on 01/10/2020 -----------------------------------------   I have reviewed the triage vital signs and the nursing notes.  HISTORY   Chief Complaint Emesis    HPI Christine Rice is a 39 y.o. female with a history of anxiety, nephrotic syndrome, kidney injury who presents to the ED for vomiting.  Patient complains of severe headache to the frontal area that started after the vomiting.  She tried Phenergan at home without any relief.  She was diagnosed with renal failure, started on steroids yesterday.  She reports the swelling is not going down.  She was seen in the hospital recently for same.  Past Medical History:  Diagnosis Date  . Anxiety   . Nephrotic syndrome     Patient Active Problem List   Diagnosis Date Noted  . AKI (acute kidney injury) ([place]) 01/03/2020  . Intractable vomiting with nausea 01/03/2020  . Pedal edema 12/31/2019  . Productive cough 12/31/2019  . Night sweats 06/10/2019  . Skin lesion 06/10/2019  . H/O hernia repair 06/10/2019  . History of smoking 10-25 pack years 07/14/2014  . Routine general medical examination at a health care facility 07/07/2014  . Umbilical hernia 0000000  . Cesarean delivery delivered 01/19/2012  . IBS 08/15/2007  . 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 Anxiety disorder 08/14/2007  . DEPRESSION 08/14/2007  . ADD 08/14/2007    Past Surgical History:  Procedure Laterality Date  . CESAREAN SECTION  01/06/05  . CESAREAN SECTION  01/19/2012   Procedure: CESAREAN SECTION;  Surgeon: Luz Lex, MD;  Location: [facility];  Service: Gynecology;  Laterality: N/A;  repeat  . HERNIA REPAIR    . INGUINAL HERNIA REPAIR  01/19/2012   Procedure: HERNIA REPAIR INGUINAL ADULT BILATERAL;  Surgeon: Adin Hector, MD;  Location: [facility];  Service: General;  Laterality: Bilateral;  . RENAL BIOPSY, PERCUTANEOUS  01/06/2020       . tummy tuck    . UMBILICAL HERNIA REPAIR  01/19/2012   Procedure: HERNIA REPAIR UMBILICAL ADULT;  Surgeon: Adin Hector, MD;  Location: [facility];  Service: General;  Laterality: N/A;  . UMBILICAL HERNIA REPAIR  11-19-14    Allergies Amoxicillin and Penicillins  Social History Social History   Tobacco Use  . Smoking status: Former Smoker    Types: Cigarettes    Quit date: 11/15/2019    Years since quitting: 0.1  . Smokeless tobacco: Never Used  Substance Use Topics  . Alcohol use: Yes    Alcohol/week: 0.0 standard drinks    Comment: Once in a while.  . Drug use: Yes    Types: Marijuana    Comment: occ- marijuana     Review of Systems Constitutional: Negative for fever. Cardiovascular: Negative for chest pain. Respiratory: Negative for shortness of breath. Gastrointestinal: Positive for abdominal pain, vomiting Musculoskeletal: Negative for back pain. Skin: Negative for rash. Neurological: Positive for headache  All systems negative/normal/unremarkable except as stated in the HPI  ____________________________________________   PHYSICAL EXAM:  VITAL SIGNS: ED Triage Vitals  Enc Vitals Group     BP 01/10/20 1323 (!) 150/104     Pulse Rate 01/10/20 1323 70     Resp 01/10/20 1323 20     Temp 01/10/20 1323 97.6 F (36.4 C)     Temp Source 01/10/20 1323 Oral     SpO2 01/10/20 1323 100 %     Weight 01/10/20 1324 179 lb  1.6 oz (81.2 kg)     Height 01/10/20 1324 5\' 6"  (1.676 m)     Head Circumference --      Peak Flow --      Pain Score 01/10/20 1324 10     Pain Loc --      Pain Edu? --      Excl. in [place]? --    Constitutional: Alert and oriented.  Moderate distress from pain Eyes: Conjunctivae are normal. Normal extraocular movements. ENT      Head: Normocephalic and atraumatic.      Nose: No congestion/rhinnorhea.      Mouth/Throat: Mucous membranes are moist.      Neck: No stridor. Cardiovascular: Normal rate, regular rhythm. No murmurs, rubs, or  gallops. Respiratory: Normal respiratory effort without tachypnea nor retractions. Breath sounds are clear and equal bilaterally. No wheezes/rales/rhonchi. Gastrointestinal: Soft and nontender. Normal bowel sounds Musculoskeletal: Nontender with normal range of motion in extremities.  Bilateral edema is noted Neurologic:  Normal speech and language. No gross focal neurologic deficits are appreciated.  Skin:  Skin is warm, dry and intact. No rash noted. Psychiatric: Depressed mood and affect ____________________________________________  ED COURSE:  As part of my medical decision making, I reviewed the following data within the [place] History obtained from family if available, nursing notes, old chart and ekg, as well as notes from prior ED visits. Patient presented for headache, vomiting and nephrotic syndrome, we will assess with labs and imaging as indicated at this time.   Procedures  Christine Rice was evaluated in Emergency Department on 01/10/2020 for the symptoms described in the history of present illness. She was evaluated in the context of the global COVID-19 pandemic, which necessitated consideration that the patient might be at risk for infection with the SARS-CoV-2 virus that causes COVID-19. Institutional protocols and algorithms that pertain to the evaluation of patients at risk for COVID-19 are in a state of rapid change based on information released by regulatory bodies including the CDC and federal and state organizations. These policies and algorithms were followed during the patient's care in the ED.  ____________________________________________   LABS (pertinent positives/negatives)  Labs Reviewed  LIPASE, BLOOD - Abnormal; Notable for the following components:      Result Value   Lipase 107 (*)    All other components within normal limits  COMPREHENSIVE METABOLIC PANEL - Abnormal; Notable for the following components:   Sodium 130 (*)    CO2 21  (*)    Glucose, Bld 116 (*)    BUN 61 (*)    Creatinine, Ser 3.68 (*)    Calcium 7.9 (*)    Total Protein 5.5 (*)    Albumin 2.0 (*)    GFR calc non Af Amer 15 (*)    GFR calc Af Amer 17 (*)    All other components within normal limits  CBC - Abnormal; Notable for the following components:   WBC 25.1 (*)    All other components within normal limits  URINALYSIS, COMPLETE (UACMP) WITH MICROSCOPIC  POC URINE PREG, ED   ____________________________________________   DIFFERENTIAL DIAGNOSIS   Dehydration, electrolyte abnormality, worsening renal failure, nephrotic syndrome, subarachnoid hemorrhage, meningitis, occult infection, UTI  FINAL ASSESSMENT AND PLAN  Nephrotic syndrome, acute kidney injury, headache   Plan: The patient had presented for headache and intractable vomiting. Patient's labs indicated worsening renal function compared to the 3 days ago.  White blood cell count appears to be increasing which is  likely dehydration and steroid related.  Currently headache appears to have resolved.  I have discussed with nephrology who recommends admission with gentle saline and steroids.  I will discuss with the hospitalist for admission.   Laurence Aly, MD    Note: This note was generated in part or whole with voice recognition software. Voice recognition is usually quite accurate but there are transcription errors that can and very often do occur. I apologize for any typographical errors that were not detected and corrected.     Earleen Newport, MD 01/10/20 [phone]

## 2020-01-10 NOTE — ED Notes (Addendum)
ED TO INPATIENT HANDOFF REPORT  ED Nurse Name and Phone #: Karena Addison [phone]  S Name/Age/Gender Christine Rice 39 y.o. female Room/Bed: ED04A/ED04A  Code Status   Code Status: Full Code  Home/SNF/Other Home Patient oriented to: self, place, time and situation Is this baseline? Yes   Triage Complete: Triage complete  Chief Complaint Acute kidney injury [facility]) [N17.9]  Triage Note First Nurse Note:  Arrives via EMS.  C/O one episode of emesis today and now c/o headache.  Diagnosed yesterday with renal failure and started on some new medications.  Pt discharged Tuesday after admission for nephrotic syndrome and AKI.  Pt here today r/t vomiting again.  C/o severe headache to frontal area that started after the vomiting.  Has tried her home phenergan without relief.  VSS at this time.  No fevers.     Allergies Allergies  Allergen Reactions  . Amoxicillin Hives    REACTION: rash  . Penicillins Hives    Level of Care/Admitting Diagnosis ED Disposition    ED Disposition Condition [place] [facility] Area: [place] [[id number]]  Level of Care: Med-Surg [16]  Covid Evaluation: Confirmed COVID Negative  [facility]: [id number]  Admitting Physician: Ezekiel Slocumb [id number]  Attending Physician: Ezekiel Slocumb [id number]  Estimated length of stay: past midnight tomorrow  Certification:: I certify this patient will need inpatient services for at least 2 midnights       B Medical/Surgery History Past Medical History:  Diagnosis Date  . Anxiety   . Nephrotic syndrome    Past Surgical History:  Procedure Laterality Date  . CESAREAN SECTION  01/06/05  . CESAREAN SECTION  01/19/2012   Procedure: CESAREAN SECTION;  Surgeon: Luz Lex, MD;  Location: [facility];  Service: Gynecology;  Laterality: N/A;  repeat  . HERNIA REPAIR    . INGUINAL HERNIA REPAIR  01/19/2012   Procedure: HERNIA REPAIR INGUINAL ADULT BILATERAL;  Surgeon: Adin Hector, MD;  Location: [facility];  Service: General;  Laterality: Bilateral;  . RENAL BIOPSY, PERCUTANEOUS  01/06/2020      . tummy tuck    . UMBILICAL HERNIA REPAIR  01/19/2012   Procedure: HERNIA REPAIR UMBILICAL ADULT;  Surgeon: Adin Hector, MD;  Location: [facility];  Service: General;  Laterality: N/A;  . UMBILICAL HERNIA REPAIR  11-19-14     A IV Location/Drains/Wounds Patient Lines/Drains/Airways Status   Active Line/Drains/Airways    Name:   Placement date:   Placement time:   Site:   Days:   Peripheral IV 01/10/20 Left Antecubital   01/10/20    1550    Antecubital   less than 1          Intake/Output Last 24 hours No intake or output data in the 24 hours ending 01/10/20 1745  Labs/Imaging Results for orders placed or performed during the hospital encounter of 01/10/20 (from the past 48 hour(s))  Lipase, blood     Status: Abnormal   Collection Time: 01/10/20  1:25 PM  Result Value Ref Range   Lipase 107 (H) 11 - 51 U/L    Comment: Performed at [facility], [address] [place], [place] [address]  Comprehensive metabolic panel     Status: Abnormal   Collection Time: 01/10/20  1:25 PM  Result Value Ref Range   Sodium 130 (L) 135 - 145 mmol/L   Potassium 4.7 3.5 - 5.1 mmol/L   Chloride 101 98 - 111  mmol/L   CO2 21 (L) 22 - 32 mmol/L   Glucose, Bld 116 (H) 70 - 99 mg/dL   BUN 61 (H) 6 - 20 mg/dL   Creatinine, Ser 3.68 (H) 0.44 - 1.00 mg/dL   Calcium 7.9 (L) 8.9 - 10.3 mg/dL   Total Protein 5.5 (L) 6.5 - 8.1 g/dL   Albumin 2.0 (L) 3.5 - 5.0 g/dL   AST 19 15 - 41 U/L   ALT 18 0 - 44 U/L   Alkaline Phosphatase 61 38 - 126 U/L   Total Bilirubin 0.6 0.3 - 1.2 mg/dL   GFR calc non Af Amer 15 (L) >60 mL/min   GFR calc Af Amer 17 (L) >60 mL/min   Anion gap 8 5 - 15    Comment: Performed at [facility], [address] [place], [place] [address]  CBC     Status: Abnormal   Collection Time: 01/10/20  1:25 PM  Result Value Ref Range   WBC 25.1 (H)  4.0 - 10.5 K/uL   RBC 4.56 3.87 - 5.11 MIL/uL   Hemoglobin 14.3 12.0 - 15.0 g/dL   HCT 40.9 36.0 - 46.0 %   MCV 89.7 80.0 - 100.0 fL   MCH 31.4 26.0 - 34.0 pg   MCHC 35.0 30.0 - 36.0 g/dL   RDW 11.9 11.5 - 15.5 %   Platelets 313 150 - 400 K/uL   nRBC 0.0 0.0 - 0.2 %    Comment: Performed at [facility], [address], [place], [place] [address]   No results found.  Pending Labs Unresulted Labs (From admission, onward)    Start     Ordered   01/11/20 0500  Comprehensive metabolic panel  Tomorrow morning,   STAT     01/10/20 1730   01/11/20 0500  CBC  Tomorrow morning,   STAT     01/10/20 1730   01/10/20 1720  Respiratory Panel by RT PCR (Flu A&B, Covid) - Nasopharyngeal Swab  (Tier 2 Respiratory Panel by RT PCR (Flu A&B, Covid) (TAT 2 hrs))  Once,   STAT    Question Answer Comment  Is this test for diagnosis or screening Screening   Symptomatic for COVID-19 as defined by CDC No   Hospitalized for COVID-19 No   Admitted to ICU for COVID-19 No   Previously tested for COVID-19 Yes   Resident in a congregate (group) care setting No   Employed in healthcare setting No   Pregnant No      01/10/20 1719   01/10/20 1325  Urinalysis, Complete w Microscopic  ONCE - STAT,   STAT     01/10/20 1324          Vitals/Pain Today's Vitals   01/10/20 1323 01/10/20 1324 01/10/20 1555  BP: (!) 150/104    Pulse: 70    Resp: 20    Temp: 97.6 F (36.4 C)    TempSrc: Oral    SpO2: 100%    Weight:  81.2 kg   Height:  5\' 6"  (1.676 m)   PainSc:  10-Worst pain ever 10-Worst pain ever    Isolation Precautions No active isolations  Medications Medications  0.9 %  sodium chloride infusion ( Intravenous Canceled Entry 01/10/20 1744)  methylPREDNISolone sodium succinate (SOLU-MEDROL) 125 mg/2 mL injection 60 mg (60 mg Intravenous Given 01/10/20 1731)  0.9 %  sodium chloride infusion ( Intravenous New Bag/Given 01/10/20 1729)  heparin injection 5,000 Units (has no administration in  time range)  acetaminophen (TYLENOL) tablet 650 mg (has no administration in time range)    Or  acetaminophen (TYLENOL) suppository 650 mg (has no administration in time range)  traMADol (ULTRAM) tablet 50 mg (has no administration in time range)  traZODone (DESYREL) tablet 50 mg (has no administration in time range)  polyethylene glycol (MIRALAX / GLYCOLAX) packet 17 g (has no administration in time range)  bisacodyl (DULCOLAX) EC tablet 5 mg (has no administration in time range)  ondansetron (ZOFRAN) tablet 4 mg (has no administration in time range)    Or  ondansetron (ZOFRAN) injection 4 mg (has no administration in time range)  multivitamin with minerals tablet 1 tablet (has no administration in time range)  sodium chloride 0.9 % bolus 500 mL (0 mLs Intravenous Stopping Infusion hung by another clincian 01/10/20 1729)  HYDROmorphone (DILAUDID) injection 1 mg (1 mg Intravenous Given 01/10/20 1554)  metoCLOPramide (REGLAN) injection 10 mg (10 mg Intravenous Given 01/10/20 1554)  ondansetron (ZOFRAN) injection 4 mg (4 mg Intravenous Given 01/10/20 1736)    Mobility walks Low fall risk   Focused Assessments    R Recommendations: See Admitting Provider Note  Report given to: Terri, RN

## 2020-01-10 NOTE — ED Triage Notes (Signed)
Pt discharged Tuesday after admission for nephrotic syndrome and AKI.  Pt here today r/t vomiting again.  C/o severe headache to frontal area that started after the vomiting.  Has tried her home phenergan without relief.  VSS at this time.  No fevers.

## 2020-01-10 NOTE — Progress Notes (Signed)
[facility]  ROUNDING NOTE   Subjective:  Patient well known to [place]. Underwent renal biopsy 01/06/20. This showed minimal change disease. ? Related to vaping which she started 5 weeks ago. Comes back now with severe headache.   Given Dilaudid and headache improved.  Objective:  Vital signs in last 24 hours:  Temp:  [97.6 F (36.4 C)] 97.6 F (36.4 C) (01/29 1323) Pulse Rate:  [70] 70 (01/29 1323) Resp:  [20] 20 (01/29 1323) BP: (150)/(104) 150/104 (01/29 1323) SpO2:  [100 %] 100 % (01/29 1323) Weight:  [81.2 kg] 81.2 kg (01/29 1324)  Weight change:  Filed Weights   01/10/20 1324  Weight: 81.2 kg    Intake/Output: No intake/output data recorded.   Intake/Output this shift:  No intake/output data recorded.  Physical Exam: General: No acute distress  Head: Normocephalic, atraumatic. Moist oral mucosal membranes  Eyes: Mild periorbital edema  Neck: Supple, trachea midline  Lungs:  Clear to auscultation, normal effort  Heart: S1S2 no rubs  Abdomen:  Soft, nontender, bowel sounds present  Extremities: 1+ peripheral edema.  Neurologic: Awake, alert, following commands  Skin: No lesions       Basic Metabolic Panel: Recent Labs  Lab 01/04/20 0642 01/04/20 0642 01/05/20 0353 01/05/20 0353 01/06/20 0449 01/07/20 0936 01/10/20 1325  NA 138  --  134*  --  134* 133* 130*  K 4.3  --  4.1  --  4.3 4.8 4.7  CL 107  --  106  --  107 102 101  CO2 23  --  24  --  24 27 21*  GLUCOSE 85  --  88  --  93 107* 116*  BUN 36*  --  42*  --  41* 47* 61*  CREATININE 2.19*  --  1.61*  --  1.57* 2.35* 3.68*  CALCIUM 7.3*   < > 7.2*   < > 7.4* 7.6* 7.9*   < > = values in this interval not displayed.    Liver Function Tests: Recent Labs  Lab 01/10/20 1325  AST 19  ALT 18  ALKPHOS 61  BILITOT 0.6  PROT 5.5*  ALBUMIN 2.0*   Recent Labs  Lab 01/10/20 1325  LIPASE 107*   No results for input(s): AMMONIA in the last 168 hours.  CBC: Recent Labs  Lab  01/04/20 0642 01/06/20 0813 01/10/20 1325  WBC 7.8 10.5 25.1*  NEUTROABS 4.9 6.5  --   HGB 13.0 14.2 14.3  HCT 38.0 41.6 40.9  MCV 90.7 89.1 89.7  PLT 187 210 313    Cardiac Enzymes: No results for input(s): CKTOTAL, CKMB, CKMBINDEX, TROPONINI in the last 168 hours.  BNP: Invalid input(s): POCBNP  CBG: No results for input(s): GLUCAP in the last 168 hours.  Microbiology: Results for orders placed or performed during the hospital encounter of 01/03/20  SARS CORONAVIRUS 2 (TAT 6-24 HRS) Nasopharyngeal Nasopharyngeal Swab     Status: None   Collection Time: 01/03/20  5:18 PM   Specimen: Nasopharyngeal Swab  Result Value Ref Range Status   SARS Coronavirus 2 NEGATIVE NEGATIVE Final    Comment: (NOTE) SARS-CoV-2 target nucleic acids are NOT DETECTED. The SARS-CoV-2 RNA is generally detectable in upper and lower respiratory specimens during the acute phase of infection. Negative results do not preclude SARS-CoV-2 infection, do not rule out co-infections with other pathogens, and should not be used as the sole basis for treatment or other patient management decisions. Negative results must be combined with clinical observations, patient  history, and epidemiological information. The expected result is Negative. Fact Sheet for Patients: SugarRoll.be Fact Sheet for Healthcare Providers: https://www.woods-mathews.com/ This test is not yet approved or cleared by the [place] FDA and  has been authorized for detection and/or diagnosis of SARS-CoV-2 by FDA under an Emergency Use Authorization (EUA). This EUA will remain  in effect (meaning this test can be used) for the duration of the COVID-19 declaration under Section 56 4(b)(1) of the Act, 21 U.S.C. section 360bbb-3(b)(1), unless the authorization is terminated or revoked sooner. Performed at [facility], [place] [address], [place], [place] [address]   Urine Culture     Status:  Abnormal   Collection Time: 01/03/20  5:48 PM   Specimen: Urine, Random  Result Value Ref Range Status   Specimen Description   Final    URINE, RANDOM Performed at [facility], [address] [place], [place] [address]    Special Requests   Final    NONE Performed at [facility], [address] [place], [place] [address]    Culture MULTIPLE SPECIES PRESENT, SUGGEST RECOLLECTION (A)  Final   Report Status 01/06/2020 FINAL  Final    Coagulation Studies: No results for input(s): LABPROT, INR in the last 72 hours.  Urinalysis: No results for input(s): COLORURINE, LABSPEC, PHURINE, GLUCOSEU, HGBUR, BILIRUBINUR, KETONESUR, PROTEINUR, UROBILINOGEN, NITRITE, LEUKOCYTESUR in the last 72 hours.  Invalid input(s): APPERANCEUR    Imaging: No results found.   Medications:   . sodium chloride      ondansetron  Assessment/ Plan:  39 y.o. female with past medical history of IBS, ADHD, depression who was recently evaluated by our practice for new onset nephrotic syndrome.  Work up: Renal ultrasound shows mild bilateral echogenicity, negative ANA/ANCA/hepatitis serologies/HIV.  Normal complements.  Patient did recently start vaping.  Renal biopsy perforemd 01/06/20, 3 cores obtained. Sent to [facility], demonstrated minimal change disease, ? Secondary to vaping.   1.  Minimal change disease 2.  Acute kidney injury. 3.  Hypertension. 4.  Headache.   Plan: Patient underwent renal biopsy on 01/06/2020.  We received preliminary result yesterday which demonstrated minimal-change disease.  Over the past 5 weeks the patient did recently start vaping.  Unclear as to whether this is related to her minimal-change disease.  We will start the patient on Solu-Medrol 60 mg IV daily.  She is also had recent headaches.  Unclear as to whether this is a response to steroids versus her blood pressure.  Consider neurology evaluation for the headaches.  In addition she now has  recurrent acute kidney injury.  Suspect that this could be related to her recent nausea and vomiting.  Therefore temporarily we will administer the patient 0.9 normal saline at 50 cc/h.  However once her acute kidney injury has improved consider placing the patient back on diuretics in the form of a Lasix drip.  Further plan as patient progresses.   LOS: 0 Tabria Steines 1/29/20214:08 PM

## 2020-01-10 NOTE — ED Triage Notes (Signed)
First Nurse Note:  Arrives via EMS.  C/O one episode of emesis today and now c/o headache.  Diagnosed yesterday with renal failure and started on some new medications.

## 2020-01-11 DIAGNOSIS — N040 Nephrotic syndrome with minor glomerular abnormality: Principal | ICD-10-CM

## 2020-01-11 LAB — URINALYSIS, COMPLETE (UACMP) WITH MICROSCOPIC
Bilirubin Urine: NEGATIVE
Glucose, UA: NEGATIVE mg/dL
Ketones, ur: NEGATIVE mg/dL
Leukocytes,Ua: NEGATIVE
Nitrite: NEGATIVE
Protein, ur: >=300 mg/dL — AB
Specific Gravity, Urine: 1.019 (ref 1.005–1.030)
pH: 5 (ref 5.0–8.0)

## 2020-01-11 LAB — COMPREHENSIVE METABOLIC PANEL
ALT: 13 U/L (ref 0–44)
AST: 13 U/L — ABNORMAL LOW (ref 15–41)
Albumin: 1.7 g/dL — ABNORMAL LOW (ref 3.5–5.0)
Alkaline Phosphatase: 41 U/L (ref 38–126)
Anion gap: 3 — ABNORMAL LOW (ref 5–15)
BUN: 63 mg/dL — ABNORMAL HIGH (ref 6–20)
CO2: 24 mmol/L (ref 22–32)
Calcium: 7.6 mg/dL — ABNORMAL LOW (ref 8.9–10.3)
Chloride: 104 mmol/L (ref 98–111)
Creatinine, Ser: 3.81 mg/dL — ABNORMAL HIGH (ref 0.44–1.00)
GFR calc Af Amer: 16 mL/min — ABNORMAL LOW (ref >60)
GFR calc non Af Amer: 14 mL/min — ABNORMAL LOW (ref >60)
Glucose, Bld: 93 mg/dL (ref 70–99)
Potassium: 4.7 mmol/L (ref 3.5–5.1)
Sodium: 131 mmol/L — ABNORMAL LOW (ref 135–145)
Total Bilirubin: 0.4 mg/dL (ref 0.3–1.2)
Total Protein: 4.6 g/dL — ABNORMAL LOW (ref 6.5–8.1)

## 2020-01-11 LAB — CBC
HCT: 35.5 % — ABNORMAL LOW (ref 36.0–46.0)
Hemoglobin: 12.2 g/dL (ref 12.0–15.0)
MCH: 30.9 pg (ref 26.0–34.0)
MCHC: 34.4 g/dL (ref 30.0–36.0)
MCV: 89.9 fL (ref 80.0–100.0)
Platelets: 280 10*3/uL (ref 150–400)
RBC: 3.95 MIL/uL (ref 3.87–5.11)
RDW: 12 % (ref 11.5–15.5)
WBC: 10.9 10*3/uL — ABNORMAL HIGH (ref 4.0–10.5)
nRBC: 0 % (ref 0.0–0.2)

## 2020-01-11 LAB — MAGNESIUM: Magnesium: 2.4 mg/dL (ref 1.7–2.4)

## 2020-01-11 MED ORDER — PANTOPRAZOLE SODIUM 40 MG IV SOLR
40.0000 mg | INTRAVENOUS | Status: DC
  Administered 2020-01-11 – 2020-01-14 (×4): 40 mg via INTRAVENOUS
  Filled 2020-01-11 (×4): qty 40

## 2020-01-11 MED ORDER — FUROSEMIDE 10 MG/ML IJ SOLN
40.0000 mg | Freq: Two times a day (BID) | INTRAMUSCULAR | Status: DC
  Administered 2020-01-11 – 2020-01-13 (×4): 40 mg via INTRAVENOUS
  Filled 2020-01-11 (×4): qty 4

## 2020-01-11 MED ORDER — SULFAMETHOXAZOLE-TRIMETHOPRIM 400-80 MG PO TABS
1.0000 | ORAL_TABLET | ORAL | Status: DC
  Administered 2020-01-13 – 2020-01-15 (×2): 1 via ORAL
  Filled 2020-01-11 (×2): qty 1

## 2020-01-11 NOTE — Progress Notes (Signed)
Christine Rice  MRN: [id number]  DOB/AGE: August 19, 1981 39 y.o.  Primary Care Physician:Tower, Wynelle Fanny, MD  Admit date: 01/10/2020  Chief Complaint:  Chief Complaint  Patient presents with  . Emesis    S-Pt presented on  01/10/2020 with  Chief Complaint  Patient presents with  . Emesis  . Pt main complaint was " I have gained 30 lbs , look at my belly and my legs". Pt significant other was present in the room and asked me relevant questions about pt. I answered pt queries to best of my ability.   Medications . heparin  5,000 Units Subcutaneous Q12H  . methylPREDNISolone (SOLU-MEDROL) injection  60 mg Intravenous Daily  . multivitamin with minerals  1 tablet Oral Daily         [id number] from the symptoms mentioned above,there are no other symptoms referable to all systems reviewed.  Physical Exam: Vital signs in last 24 hours: Temp:  [97.6 F (36.4 C)-98.6 F (37 C)] 98.6 F (37 C) (01/30 0427) Pulse Rate:  [61-75] 70 (01/30 0427) Resp:  [20] 20 (01/30 0427) BP: (144-165)/(76-113) 149/85 (01/30 0427) SpO2:  [91 %-100 %] 98 % (01/30 0427) Weight:  [81.2 kg-85.1 kg] 85.1 kg (01/29 1956) Weight change:  Last BM Date: 01/10/20  Intake/Output from previous day: 01/29 0701 - 01/30 0700 In: 506.3 [I.V.:406.3; IV Piggyback:100] Out: -  No intake/output data recorded.   Physical Exam: General- pt is awake,alert, oriented to time place and person Resp- No acute REsp distress, CTA B/L NO Rhonchi CVS- S1S2 regular in rate and rhythm GIT- BS+, soft, NT, ND EXT- 2+ LE Edema, no Cyanosis   Lab Results: CBC Recent Labs    01/10/20 1325 01/11/20 0455  WBC 25.1* 10.9*  HGB 14.3 12.2  HCT 40.9 35.5*  PLT 313 280    BMET Recent Labs    01/10/20 1325 01/11/20 0455  NA 130* 131*  K 4.7 4.7  CL 101 104  CO2 21* 24  GLUCOSE 116* 93  BUN 61* 63*  CREATININE 3.68* 3.81*  CALCIUM 7.9* 7.6*    Creatinine trend 2021  1.1==>3.8   Sodium  trend 134==>131    MICRO Recent Results (from the past 240 hour(s))  SARS CORONAVIRUS 2 (TAT 6-24 HRS) Nasopharyngeal Nasopharyngeal Swab     Status: None   Collection Time: 01/03/20  5:18 PM   Specimen: Nasopharyngeal Swab  Result Value Ref Range Status   SARS Coronavirus 2 NEGATIVE NEGATIVE Final    Comment: (NOTE) SARS-CoV-2 target nucleic acids are NOT DETECTED. The SARS-CoV-2 RNA is generally detectable in upper and lower respiratory specimens during the acute phase of infection. Negative results do not preclude SARS-CoV-2 infection, do not rule out co-infections with other pathogens, and should not be used as the sole basis for treatment or other patient management decisions. Negative results must be combined with clinical observations, patient history, and epidemiological information. The expected result is Negative. Fact Sheet for Patients: SugarRoll.be Fact Sheet for Healthcare Providers: https://www.woods-mathews.com/ This test is not yet approved or cleared by the [place] FDA and  has been authorized for detection and/or diagnosis of SARS-CoV-2 by FDA under an Emergency Use Authorization (EUA). This EUA will remain  in effect (meaning this test can be used) for the duration of the COVID-19 declaration under Section 56 4(b)(1) of the Act, 21 U.S.C. section 360bbb-3(b)(1), unless the authorization is terminated or revoked sooner. Performed at [facility], [place] [address] [place], [place] [address]  Urine Culture     Status: Abnormal   Collection Time: 01/03/20  5:48 PM   Specimen: Urine, Random  Result Value Ref Range Status   Specimen Description   Final    URINE, RANDOM Performed at [facility], [address] [place], [place] [address]    Special Requests   Final    NONE Performed at [facility], [address] [place], [place] [address]    Culture MULTIPLE SPECIES PRESENT,  SUGGEST RECOLLECTION (A)  Final   Report Status 01/06/2020 FINAL  Final  Respiratory Panel by RT PCR (Flu A&B, Covid) - Nasopharyngeal Swab     Status: None   Collection Time: 01/10/20  5:34 PM   Specimen: Nasopharyngeal Swab  Result Value Ref Range Status   SARS Coronavirus 2 by RT PCR NEGATIVE NEGATIVE Final    Comment: (NOTE) SARS-CoV-2 target nucleic acids are NOT DETECTED. The SARS-CoV-2 RNA is generally detectable in upper respiratoy specimens during the acute phase of infection. The lowest concentration of SARS-CoV-2 viral copies this assay can detect is 131 copies/mL. A negative result does not preclude SARS-Cov-2 infection and should not be used as the sole basis for treatment or other patient management decisions. A negative result may occur with  improper specimen collection/handling, submission of specimen other than nasopharyngeal swab, presence of viral mutation(s) within the areas targeted by this assay, and inadequate number of viral copies (<131 copies/mL). A negative result must be combined with clinical observations, patient history, and epidemiological information. The expected result is Negative. Fact Sheet for Patients:  PinkCheek.be Fact Sheet for Healthcare Providers:  GravelBags.it This test is not yet ap proved or cleared by the [place] FDA and  has been authorized for detection and/or diagnosis of SARS-CoV-2 by FDA under an Emergency Use Authorization (EUA). This EUA will remain  in effect (meaning this test can be used) for the duration of the COVID-19 declaration under Section 564(b)(1) of the Act, 21 U.S.C. section 360bbb-3(b)(1), unless the authorization is terminated or revoked sooner.    Influenza A by PCR NEGATIVE NEGATIVE Final   Influenza B by PCR NEGATIVE NEGATIVE Final    Comment: (NOTE) The Xpert Xpress SARS-CoV-2/FLU/RSV assay is intended as an aid in  the diagnosis of influenza  from Nasopharyngeal swab specimens and  should not be used as a sole basis for treatment. Nasal washings and  aspirates are unacceptable for Xpert Xpress SARS-CoV-2/FLU/RSV  testing. Fact Sheet for Patients: PinkCheek.be Fact Sheet for Healthcare Providers: GravelBags.it This test is not yet approved or cleared by the [place] FDA and  has been authorized for detection and/or diagnosis of SARS-CoV-2 by  FDA under an Emergency Use Authorization (EUA). This EUA will remain  in effect (meaning this test can be used) for the duration of the  Covid-19 declaration under Section 564(b)(1) of the Act, 21  U.S.C. section 360bbb-3(b)(1), unless the authorization is  terminated or revoked. Performed at [facility], [address], [place], [place] [address]       Lab Results  Component Value Date   CALCIUM 7.6 (L) 01/11/2020      Autoimmune work-up  M spike negative  Results for PAISYN, YOUNGDAHL (MRN [id number]) as of 01/11/2020 09:42  Ref. Range 01/01/2020 19:00 01/01/2020 19:31  Anti Nuclear Antibody (ANA) Latest Ref Range: Negative   Negative  Cytoplasmic (C-ANCA) Latest Ref Range: Neg:<1:20 titer  <1:20  P-ANCA Latest Ref Range: Neg:<1:20 titer  <1:20  Atypical P-ANCA titer Latest Ref  Range: Neg:<1:20 titer  <1:20  C3 Complement Latest Ref Range: 82 - 167 mg/dL 111   Complement C4, Body Fluid Latest Ref Range: 12 - 38 mg/dL 23   Kappa free light chain Latest Ref Range: 3.3 - 19.4 mg/L  75.2 (H)  Lamda free light chains Latest Ref Range: 5.7 - 26.3 mg/L  51.9 (H)  Kappa, lamda light chain ratio Latest Ref Range: 0.26 - 1.65   1.45         Impression: 1)Renal  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 Patient is on steroids  3)HTN Blood pressure at goal   3) elevated WBC counts Secondary to steroids  4) hyponatremia Hypervolemic hyponatremia We will start patient on diuretics     5) anasarca Secondary to nephrotic syndrome Patient is on IV steroids We will start patient on diuretics  6) infection prevention As we have patient on high-dose steroids we will start patient on Bactrim we will adjust the dose of Bactrim to GFR  7) GI prevention As patient is on steroids we will start patient on Protonix    Plan:  Anasarca/Hyponatremia We will start patient on IV diuretics  Infection prevention We will start patient on Bactrim  GI prevention We will start patient on Protonix  Proteinuria Will start RAS blockers as outpt Pt in AKI -so cannot start now     I educated pt at length about kidney realted issues and answered her queries to best of my ability     Kolbie Clarkston s [facility] 01/11/2020, 9:42 AM

## 2020-01-11 NOTE — Progress Notes (Addendum)
PROGRESS NOTE    Christine Rice  [id number] DOB: Nov 15, 1981 DOA: 01/10/2020  PCP: Abner Greenspan, MD    LOS - 1   Brief Narrative:  Christine Rice is a 39 y.o. female with medical history significant of recently diagnosed minimal change disease (biopsied last week and just started steroid), anxiety who presented to the ED 1/29 with intractable vomiting and severe headache.  Recent admission for same from 1/22 to 1/26.  BP also uncontrolled on presentation, as patient unable to take her PRN oral clonidine.  In the ED,   Patient admitted with nephrology consulted.  hypertensive 150/104 -> 165/113 otherwise stable vitals.  Labs notable for Na 130, CO2 21, BUN 61, Cr 3.68 (from 2.35 yesterday and 1.57 on 1/25), lipase 107, albumin 2.0, WBC 25k.  Flu A/B and Covid-19 negative.  Nephrology was consulted by ED physician, recommended admission for IV steroids, BP control,  and gentle fluids for AKI.  Subjective 1/30: Patient seen today.  Reports feels better than yesterday.  No vomiting since coming to floor from the ED last night.  Still has a headache but less severe today.  Has been nibbling on some food, but very little.  Not ready for oral meds just yet.  Pattern of her symptoms is always that vomiting episodes trigger her headaches.  She is unsure if the elevated BP's happen before onset of vomiting or are a result of vomiting and headache.  Says she has never had hypertension in the past.   Assessment & Plan:   Principal Problem:   Acute kidney injury ([place]) Active Problems:   Intractable vomiting with nausea   Hypertensive urgency   Headache   Hyponatremia   Nephrotic syndrome, minimal change   Leukocytosis   Hypoalbuminemia   Anxiety disorder   Acute Kidney Injury - Present on admission, superimposed on nephrotic syndrome.   Most likely pre-renal given vomiting and secondary dehydration. Minimal Change Disease / Nephrotic Syndrome --Nephrology following --started IV Lasix  today --gentle IV hydration for AKI - continue 12 more hours then stop --IV steroids per nephro --Bactrim for infection prevention while immunosuppressed on steroids --daily BMP's   Hypertensive Urgency - present on admission, improved. Had been discharged with PRN oral clonidine for uncontrolled BP, but unable to tolerate PO intake.   Suspect this may be causing her headache. --Hydralazine and/or Labetalol IV PRN --may need to be started on scheduled med, will monitor  Intractable nausea/vomiting -  Unclear cause at this point.  Unclear if related to new medications but doubtful as onset of N/V was before starting Prednisone, and she said she has tolerated Demadex without issues before N/V started.  Lipase mildly elevated and with epigastric tenderness, could have mild case of acute pancreatitis. --IV fluids as above, per nephro --IV Zofran and/or Phenergan PRN  Headache - suspect due to uncontrolled hypertension.  Can't take oral meds, did not tolerate dilaudid in the ED, and due to AKI will avoid Toradol.   --acetaminophen IV PRN --cool compresses PRN, room dark, quiet environment --this may improve once her BP is controlled --consult neurology if not better with BP control  Hyponatremia, mild, Na 130 on admission.  Given anasarca due to MCD, suspect hypervolemic.   Expect improvement with resuming diuretics.   --daily BMP's  Non-anion gap metabolic acidosis - most likely due to N/V --monitor   Hypoalbuminemia - secondary to MCD  Leukocytosis - Improved.  WBC 25k on admission, no evidence of infection.   Suspect reactive  in setting in N/V in addition to steroids.   --Monitor CBC --Monitor for s/sx's of infection  Anxiety Disorder - continue home Xanax PRN    DVT prophylaxis: heparin   Code Status: Full Code  Family Communication: none at bedside during encounter  Disposition Plan:  Expect d/c home pending improvement and cleared by nephrology.   Consultants:    Nephrology  Procedures:   none  Antimicrobials:   none    Objective: Vitals:   01/10/20 1956 01/11/20 0427 01/11/20 1330 01/11/20 1424  BP: (!) 161/92 (!) 149/85 (!) 170/85 (!) 143/81  Pulse: 61 70 61 69  Resp: 20 20    Temp: 97.9 F (36.6 C) 98.6 F (37 C) 98.1 F (36.7 C)   TempSrc: Oral Oral Oral   SpO2: 91% 98% 98% 98%  Weight: 85.1 kg     Height: 5\' 6"  (1.676 m)       Intake/Output Summary (Last 24 hours) at 01/11/2020 1516 Last data filed at 01/11/2020 1000 Gross per 24 hour  Intake 1237.45 ml  Output 450 ml  Net 787.45 ml   Filed Weights   01/10/20 1324 01/10/20 1956  Weight: 81.2 kg 85.1 kg    Examination:  General exam: awake, alert, no acute distress, conversational, pleasant Respiratory system: clear to auscultation bilaterally, no wheezes, rales or rhonchi, normal respiratory effort. Cardiovascular system: normal S1/S2, RRR, no JVD, murmurs, rubs, gallops, 3+ pitting edema of b/l LE's.   Gastrointestinal system: soft, non-tender, mildly distended, normal bowel sounds. Central nervous system: alert and oriented x4. no gross focal neurologic deficits, normal speech Extremities: moves all, no cyanosis, normal tone Skin: dry, intact, normal temperature Psychiatry: normal mood, congruent affect, judgement and insight appear normal    Data Reviewed: I have personally reviewed following labs and imaging studies  CBC: Recent Labs  Lab 01/06/20 0813 01/10/20 1325 01/11/20 0455  WBC 10.5 25.1* 10.9*  NEUTROABS 6.5  --   --   HGB 14.2 14.3 12.2  HCT 41.6 40.9 35.5*  MCV 89.1 89.7 89.9  PLT 210 313 [id number]   Basic Metabolic Panel: Recent Labs  Lab 01/05/20 0353 01/06/20 0449 01/07/20 0936 01/10/20 1325 01/11/20 0455  NA 134* 134* 133* 130* 131*  K 4.1 4.3 4.8 4.7 4.7  CL 106 107 102 101 104  CO2 24 24 27  21* 24  GLUCOSE 88 93 107* 116* 93  BUN 42* 41* 47* 61* 63*  CREATININE 1.61* 1.57* 2.35* 3.68* 3.81*  CALCIUM 7.2* 7.4* 7.6* 7.9* 7.6*   MG  --   --   --   --  2.4   GFR: Estimated Creatinine Clearance: 22 mL/min (A) (by C-G formula based on SCr of 3.81 mg/dL (H)). Liver Function Tests: Recent Labs  Lab 01/10/20 1325 01/11/20 0455  AST 19 13*  ALT 18 13  ALKPHOS 61 41  BILITOT 0.6 0.4  PROT 5.5* 4.6*  ALBUMIN 2.0* 1.7*   Recent Labs  Lab 01/10/20 1325  LIPASE 107*   No results for input(s): AMMONIA in the last 168 hours. Coagulation Profile: Recent Labs  Lab 01/06/20 0813  INR 0.9   Cardiac Enzymes: No results for input(s): CKTOTAL, CKMB, CKMBINDEX, TROPONINI in the last 168 hours. BNP (last 3 results) No results for input(s): PROBNP in the last 8760 hours. HbA1C: No results for input(s): HGBA1C in the last 72 hours. CBG: No results for input(s): GLUCAP in the last 168 hours. Lipid Profile: No results for input(s): CHOL, HDL, LDLCALC, TRIG, CHOLHDL, LDLDIRECT in  the last 72 hours. Thyroid Function Tests: No results for input(s): TSH, T4TOTAL, FREET4, T3FREE, THYROIDAB in the last 72 hours. Anemia Panel: No results for input(s): VITAMINB12, FOLATE, FERRITIN, TIBC, IRON, RETICCTPCT in the last 72 hours. Sepsis Labs: No results for input(s): PROCALCITON, LATICACIDVEN in the last 168 hours.  Recent Results (from the past 240 hour(s))  SARS CORONAVIRUS 2 (TAT 6-24 HRS) Nasopharyngeal Nasopharyngeal Swab     Status: None   Collection Time: 01/03/20  5:18 PM   Specimen: Nasopharyngeal Swab  Result Value Ref Range Status   SARS Coronavirus 2 NEGATIVE NEGATIVE Final    Comment: (NOTE) SARS-CoV-2 target nucleic acids are NOT DETECTED. The SARS-CoV-2 RNA is generally detectable in upper and lower respiratory specimens during the acute phase of infection. Negative results do not preclude SARS-CoV-2 infection, do not rule out co-infections with other pathogens, and should not be used as the sole basis for treatment or other patient management decisions. Negative results must be combined with clinical  observations, patient history, and epidemiological information. The expected result is Negative. Fact Sheet for Patients: SugarRoll.be Fact Sheet for Healthcare Providers: https://www.woods-mathews.com/ This test is not yet approved or cleared by the [place] FDA and  has been authorized for detection and/or diagnosis of SARS-CoV-2 by FDA under an Emergency Use Authorization (EUA). This EUA will remain  in effect (meaning this test can be used) for the duration of the COVID-19 declaration under Section 56 4(b)(1) of the Act, 21 U.S.C. section 360bbb-3(b)(1), unless the authorization is terminated or revoked sooner. Performed at [facility], [place] [address] [place], [place] [address]   Urine Culture     Status: Abnormal   Collection Time: 01/03/20  5:48 PM   Specimen: Urine, Random  Result Value Ref Range Status   Specimen Description   Final    URINE, RANDOM Performed at [facility], [address] [place], [place] [address]    Special Requests   Final    NONE Performed at [facility], [address] [place], [place] [address]    Culture MULTIPLE SPECIES PRESENT, SUGGEST RECOLLECTION (A)  Final   Report Status 01/06/2020 FINAL  Final  Respiratory Panel by RT PCR (Flu A&B, Covid) - Nasopharyngeal Swab     Status: None   Collection Time: 01/10/20  5:34 PM   Specimen: Nasopharyngeal Swab  Result Value Ref Range Status   SARS Coronavirus 2 by RT PCR NEGATIVE NEGATIVE Final    Comment: (NOTE) SARS-CoV-2 target nucleic acids are NOT DETECTED. The SARS-CoV-2 RNA is generally detectable in upper respiratoy specimens during the acute phase of infection. The lowest concentration of SARS-CoV-2 viral copies this assay can detect is 131 copies/mL. A negative result does not preclude SARS-Cov-2 infection and should not be used as the sole basis for treatment or other patient management decisions. A negative  result may occur with  improper specimen collection/handling, submission of specimen other than nasopharyngeal swab, presence of viral mutation(s) within the areas targeted by this assay, and inadequate number of viral copies (<131 copies/mL). A negative result must be combined with clinical observations, patient history, and epidemiological information. The expected result is Negative. Fact Sheet for Patients:  PinkCheek.be Fact Sheet for Healthcare Providers:  GravelBags.it This test is not yet ap proved or cleared by the [place] FDA and  has been authorized for detection and/or diagnosis of SARS-CoV-2 by FDA under an Emergency Use Authorization (EUA). This EUA will remain  in effect (meaning this test  can be used) for the duration of the COVID-19 declaration under Section 564(b)(1) of the Act, 21 U.S.C. section 360bbb-3(b)(1), unless the authorization is terminated or revoked sooner.    Influenza A by PCR NEGATIVE NEGATIVE Final   Influenza B by PCR NEGATIVE NEGATIVE Final    Comment: (NOTE) The Xpert Xpress SARS-CoV-2/FLU/RSV assay is intended as an aid in  the diagnosis of influenza from Nasopharyngeal swab specimens and  should not be used as a sole basis for treatment. Nasal washings and  aspirates are unacceptable for Xpert Xpress SARS-CoV-2/FLU/RSV  testing. Fact Sheet for Patients: PinkCheek.be Fact Sheet for Healthcare Providers: GravelBags.it This test is not yet approved or cleared by the [place] FDA and  has been authorized for detection and/or diagnosis of SARS-CoV-2 by  FDA under an Emergency Use Authorization (EUA). This EUA will remain  in effect (meaning this test can be used) for the duration of the  Covid-19 declaration under Section 564(b)(1) of the Act, 21  U.S.C. section 360bbb-3(b)(1), unless the authorization is  terminated or  revoked. Performed at [facility], [address], [place], [place] [address]          Radiology Studies: No results found.      Scheduled Meds: . furosemide  40 mg Intravenous Q12H  . heparin  5,000 Units Subcutaneous Q12H  . methylPREDNISolone (SOLU-MEDROL) injection  60 mg Intravenous Daily  . multivitamin with minerals  1 tablet Oral Daily  . pantoprazole (PROTONIX) IV  40 mg Intravenous Q24H  . [START ON 01/13/2020] sulfamethoxazole-trimethoprim  1 tablet Oral Once per day on Mon Wed Fri   Continuous Infusions: . sodium chloride    . sodium chloride 50 mL/hr at 01/11/20 1415  . acetaminophen Stopped (01/11/20 0826)     LOS: 1 day    Time spent: 35-40 minutes    Ezekiel Slocumb, DO [facility]   If 7PM-7AM, please contact night-coverage [email or web address] 01/11/2020, 3:16 PM

## 2020-01-11 NOTE — Progress Notes (Signed)
OR for Advanced Directive. Met with patient who had a lot of questions and concerns about AD.  I recommended she ask medical staff about her more specific medical questions. Left her with packet. Very pleasant conversation and prayer. Will follow up with patient.

## 2020-01-12 DIAGNOSIS — N179 Acute kidney failure, unspecified: Secondary | ICD-10-CM

## 2020-01-12 LAB — COMPREHENSIVE METABOLIC PANEL
ALT: 12 U/L (ref 0–44)
AST: 14 U/L — ABNORMAL LOW (ref 15–41)
Albumin: 1.6 g/dL — ABNORMAL LOW (ref 3.5–5.0)
Alkaline Phosphatase: 52 U/L (ref 38–126)
Anion gap: 6 (ref 5–15)
BUN: 65 mg/dL — ABNORMAL HIGH (ref 6–20)
CO2: 22 mmol/L (ref 22–32)
Calcium: 7.7 mg/dL — ABNORMAL LOW (ref 8.9–10.3)
Chloride: 104 mmol/L (ref 98–111)
Creatinine, Ser: 2.8 mg/dL — ABNORMAL HIGH (ref 0.44–1.00)
GFR calc Af Amer: 24 mL/min — ABNORMAL LOW (ref >60)
GFR calc non Af Amer: 21 mL/min — ABNORMAL LOW (ref >60)
Glucose, Bld: 96 mg/dL (ref 70–99)
Potassium: 5 mmol/L (ref 3.5–5.1)
Sodium: 132 mmol/L — ABNORMAL LOW (ref 135–145)
Total Bilirubin: 0.3 mg/dL (ref 0.3–1.2)
Total Protein: 4.5 g/dL — ABNORMAL LOW (ref 6.5–8.1)

## 2020-01-12 LAB — MAGNESIUM: Magnesium: 2.7 mg/dL — ABNORMAL HIGH (ref 1.7–2.4)

## 2020-01-12 MED ORDER — PHENOL 1.4 % MT LIQD
1.0000 | OROMUCOSAL | Status: DC | PRN
  Filled 2020-01-12: qty 177

## 2020-01-12 MED ORDER — CALCIUM CARBONATE ANTACID 500 MG PO CHEW
1.0000 | CHEWABLE_TABLET | Freq: Three times a day (TID) | ORAL | Status: DC | PRN
  Administered 2020-01-12 – 2020-01-16 (×4): 200 mg via ORAL
  Filled 2020-01-12 (×4): qty 1

## 2020-01-12 MED ORDER — ALUM & MAG HYDROXIDE-SIMETH 200-200-20 MG/5ML PO SUSP
30.0000 mL | Freq: Four times a day (QID) | ORAL | Status: DC | PRN
  Administered 2020-01-12 – 2020-01-16 (×6): 30 mL via ORAL
  Filled 2020-01-12 (×6): qty 30

## 2020-01-12 NOTE — Progress Notes (Addendum)
PROGRESS NOTE    Christine Rice  [id number] DOB: November 29, 1981 DOA: 01/10/2020  PCP: Abner Greenspan, MD    LOS - 2   Brief Narrative:  Christine Rice a 39 y.o.femalewith medical history significant ofrecently diagnosed minimal change disease (biopsied last week and just started steroid), anxiety who presented to the ED 1/29 with intractable vomiting and severe headache.  Recent admission for same from 1/22 to 1/26.  BP also uncontrolled on presentation, as patient unable to take her PRN oral clonidine.  In the ED,   Patient admitted with nephrology consulted.  hypertensive 150/104 ->165/113 otherwise stable vitals. Labs notable for Na 130, CO2 21, BUN 61, Cr 3.68 (from 2.35 yesterday and 1.57 on 1/25), lipase 107, albumin 2.0, WBC 25k. Flu A/B and Covid-19 negative.  Nephrology was consulted by ED physician, recommended admission for IV steroids, BP control,  and gentle fluids for AKI.  Subjective 1/31: Patient seen this morning at bedside.  No acute events reported overnight.  She reports having heartburn last night, Tums did not help.  Also reports episode of facial and chest flushing last evening, similar to when she has taken niacin previously.  States she used her essential oils and was able to fall asleep so unsure how long it lasted.  Denies any nausea vomiting overnight or today.  Headache mild this morning, and this time limited to the left frontal area without any eye watering.  Assessment & Plan:   Principal Problem:   Acute kidney injury ([place]) Active Problems:   Intractable vomiting with nausea   Hypertensive urgency   Headache   Hyponatremia   Nephrotic syndrome, minimal change   Leukocytosis   Hypoalbuminemia   Anxiety disorder   Acute Kidney Injury - Present on admission, superimposed on nephrotic syndrome.  Most likely pre-renal given vomiting and secondary dehydration. Minimal Change Disease / Nephrotic Syndrome --Nephrology following --Continuing IV  Lasix  --IV steroids per nephro --IV PPI for GI protection --Bactrim for infection prevention while on steroids --daily BMP's   Hypertensive Urgency - present on admission, improved. Had been discharged with PRN oral clonidine for uncontrolled BP, but unable to tolerate PO intake.  Suspect this may be causing her headache.  Use of clonidine also has issue of rebound hypertension. --Hydralazine and/or Labetalol IV PRN --may need to be started on scheduled med, will monitor  Heartburn -new onset evening of 1/30 --As needed Mylanta ordered --Is on IV PPI for GI prophylaxis  Intractable nausea/vomiting-  Unclear cause at this point, possibly uremia. Unclear if related to new medications but doubtful as onset of N/V was before starting Prednisone, and she said she has tolerated Demadex without issues before N/V started. Lipase mildly elevated and with epigastric tenderness, could have mild case of acute pancreatitis.\ --IV Zofran and/or Phenergan PRN --Diet as tolerated  Headache - suspect due to uncontrolled hypertension.  Can't take oral meds, did not tolerate dilaudid in the ED, and due to AKI will avoid Toradol.  --acetaminophen IV PRN --cool compresses PRN, room dark, quiet environment --this may improve once her BP is controlled --Neurology consulted and also attributes her headaches to hypertensive urgency.  Recommend considering magnesium and B12 supplements.  Will discuss with patient.  Hyponatremia, mild, Na 130 on admission.   Improved.  Given anasarca due to MCD, suspect hypervolemic.   Expect improvement with resuming diuretics.   --daily BMP's  Non-anion gap metabolic acidosis -resolved.  Was most likely due to N/V --monitor   Hypoalbuminemia- secondary to  MCD  Leukocytosis - Improved.  WBC 25k on admission, no evidence of infection.  Suspect reactive in setting in N/V in addition to steroids.  --Monitor CBC --Monitor for s/sx's of  infection  Anxiety Disorder - continue home Xanax PRN    DVT prophylaxis: heparin   Code Status: Full Code  Family Communication: none at bedside during encounter  Disposition Plan:  Expect d/c home pending improvement and cleared by nephrology.  Expect to be discharged home without any anticipated barriers at this time.   Consultants:   Nephrology  Procedures:   none  Antimicrobials:   Bactrim for prophylaxis while on steroids    Objective: Vitals:   01/11/20 1424 01/11/20 1955 01/12/20 0136 01/12/20 0448  BP: (!) 143/81 (!) 151/99 (!) 158/95 (!) 145/81  Pulse: 69 82 71 71  Resp:  17 18 16   Temp:  97.8 F (36.6 C) 98.5 F (36.9 C) 98.4 F (36.9 C)  TempSrc:  Oral Oral Oral  SpO2: 98% 100% 100% 97%  Weight:      Height:        Intake/Output Summary (Last 24 hours) at 01/12/2020 0739 Last data filed at 01/12/2020 0040 Gross per 24 hour  Intake 1121.79 ml  Output 1100 ml  Net 21.79 ml   Filed Weights   01/10/20 1324 01/10/20 1956  Weight: 81.2 kg 85.1 kg    Examination:  General exam: awake, alert, no acute distress Respiratory system: clear to auscultation bilaterally, no wheezes, rales or rhonchi, normal respiratory effort. Cardiovascular system: normal S1/S2, RRR, no murmurs, rubs, gallops, 3+ bilateral pitting edema of lower extremities.   Central nervous system: alert and oriented x4. no gross focal neurologic deficits, normal speech Extremities: moves all, no cyanosis, normal tone Skin: dry, intact, normal temperature Psychiatry: normal mood, congruent affect, judgement and insight appear normal    Data Reviewed: I have personally reviewed following labs and imaging studies  CBC: Recent Labs  Lab 01/06/20 0813 01/10/20 1325 01/11/20 0455  WBC 10.5 25.1* 10.9*  NEUTROABS 6.5  --   --   HGB 14.2 14.3 12.2  HCT 41.6 40.9 35.5*  MCV 89.1 89.7 89.9  PLT 210 313 [id number]   Basic Metabolic Panel: Recent Labs  Lab 01/06/20 0449  01/07/20 0936 01/10/20 1325 01/11/20 0455 01/12/20 0517  NA 134* 133* 130* 131* 132*  K 4.3 4.8 4.7 4.7 5.0  CL 107 102 101 104 104  CO2 24 27 21* 24 22  GLUCOSE 93 107* 116* 93 96  BUN 41* 47* 61* 63* 65*  CREATININE 1.57* 2.35* 3.68* 3.81* 2.80*  CALCIUM 7.4* 7.6* 7.9* 7.6* 7.7*  MG  --   --   --  2.4 2.7*   GFR: Estimated Creatinine Clearance: 29.9 mL/min (A) (by C-G formula based on SCr of 2.8 mg/dL (H)). Liver Function Tests: Recent Labs  Lab 01/10/20 1325 01/11/20 0455 01/12/20 0517  AST 19 13* 14*  ALT 18 13 12   ALKPHOS 61 41 52  BILITOT 0.6 0.4 0.3  PROT 5.5* 4.6* 4.5*  ALBUMIN 2.0* 1.7* 1.6*   Recent Labs  Lab 01/10/20 1325  LIPASE 107*   No results for input(s): AMMONIA in the last 168 hours. Coagulation Profile: Recent Labs  Lab 01/06/20 0813  INR 0.9   Cardiac Enzymes: No results for input(s): CKTOTAL, CKMB, CKMBINDEX, TROPONINI in the last 168 hours. BNP (last 3 results) No results for input(s): PROBNP in the last 8760 hours. HbA1C: No results for input(s): HGBA1C in the last 72  hours. CBG: No results for input(s): GLUCAP in the last 168 hours. Lipid Profile: No results for input(s): CHOL, HDL, LDLCALC, TRIG, CHOLHDL, LDLDIRECT in the last 72 hours. Thyroid Function Tests: No results for input(s): TSH, T4TOTAL, FREET4, T3FREE, THYROIDAB in the last 72 hours. Anemia Panel: No results for input(s): VITAMINB12, FOLATE, FERRITIN, TIBC, IRON, RETICCTPCT in the last 72 hours. Sepsis Labs: No results for input(s): PROCALCITON, LATICACIDVEN in the last 168 hours.  Recent Results (from the past 240 hour(s))  SARS CORONAVIRUS 2 (TAT 6-24 HRS) Nasopharyngeal Nasopharyngeal Swab     Status: None   Collection Time: 01/03/20  5:18 PM   Specimen: Nasopharyngeal Swab  Result Value Ref Range Status   SARS Coronavirus 2 NEGATIVE NEGATIVE Final    Comment: (NOTE) SARS-CoV-2 target nucleic acids are NOT DETECTED. The SARS-CoV-2 RNA is generally detectable  in upper and lower respiratory specimens during the acute phase of infection. Negative results do not preclude SARS-CoV-2 infection, do not rule out co-infections with other pathogens, and should not be used as the sole basis for treatment or other patient management decisions. Negative results must be combined with clinical observations, patient history, and epidemiological information. The expected result is Negative. Fact Sheet for Patients: SugarRoll.be Fact Sheet for Healthcare Providers: https://www.woods-mathews.com/ This test is not yet approved or cleared by the [place] FDA and  has been authorized for detection and/or diagnosis of SARS-CoV-2 by FDA under an Emergency Use Authorization (EUA). This EUA will remain  in effect (meaning this test can be used) for the duration of the COVID-19 declaration under Section 56 4(b)(1) of the Act, 21 U.S.C. section 360bbb-3(b)(1), unless the authorization is terminated or revoked sooner. Performed at [facility], [place] [address] [place], [place] [address]   Urine Culture     Status: Abnormal   Collection Time: 01/03/20  5:48 PM   Specimen: Urine, Random  Result Value Ref Range Status   Specimen Description   Final    URINE, RANDOM Performed at [facility], [address] [place], [place] [address]    Special Requests   Final    NONE Performed at [facility], [address] [place], [place] [address]    Culture MULTIPLE SPECIES PRESENT, SUGGEST RECOLLECTION (A)  Final   Report Status 01/06/2020 FINAL  Final  Respiratory Panel by RT PCR (Flu A&B, Covid) - Nasopharyngeal Swab     Status: None   Collection Time: 01/10/20  5:34 PM   Specimen: Nasopharyngeal Swab  Result Value Ref Range Status   SARS Coronavirus 2 by RT PCR NEGATIVE NEGATIVE Final    Comment: (NOTE) SARS-CoV-2 target nucleic acids are NOT DETECTED. The SARS-CoV-2 RNA is generally detectable  in upper respiratoy specimens during the acute phase of infection. The lowest concentration of SARS-CoV-2 viral copies this assay can detect is 131 copies/mL. A negative result does not preclude SARS-Cov-2 infection and should not be used as the sole basis for treatment or other patient management decisions. A negative result may occur with  improper specimen collection/handling, submission of specimen other than nasopharyngeal swab, presence of viral mutation(s) within the areas targeted by this assay, and inadequate number of viral copies (<131 copies/mL). A negative result must be combined with clinical observations, patient history, and epidemiological information. The expected result is Negative. Fact Sheet for Patients:  PinkCheek.be Fact Sheet for Healthcare Providers:  GravelBags.it This test is not yet ap proved or cleared by the [place] FDA and  has been  authorized for detection and/or diagnosis of SARS-CoV-2 by FDA under an Emergency Use Authorization (EUA). This EUA will remain  in effect (meaning this test can be used) for the duration of the COVID-19 declaration under Section 564(b)(1) of the Act, 21 U.S.C. section 360bbb-3(b)(1), unless the authorization is terminated or revoked sooner.    Influenza A by PCR NEGATIVE NEGATIVE Final   Influenza B by PCR NEGATIVE NEGATIVE Final    Comment: (NOTE) The Xpert Xpress SARS-CoV-2/FLU/RSV assay is intended as an aid in  the diagnosis of influenza from Nasopharyngeal swab specimens and  should not be used as a sole basis for treatment. Nasal washings and  aspirates are unacceptable for Xpert Xpress SARS-CoV-2/FLU/RSV  testing. Fact Sheet for Patients: PinkCheek.be Fact Sheet for Healthcare Providers: GravelBags.it This test is not yet approved or cleared by the [place] FDA and  has been authorized for  detection and/or diagnosis of SARS-CoV-2 by  FDA under an Emergency Use Authorization (EUA). This EUA will remain  in effect (meaning this test can be used) for the duration of the  Covid-19 declaration under Section 564(b)(1) of the Act, 21  U.S.C. section 360bbb-3(b)(1), unless the authorization is  terminated or revoked. Performed at [facility], [address] [place], [place] [address]          Radiology Studies: No results found.      Scheduled Meds: . furosemide  40 mg Intravenous Q12H  . heparin  5,000 Units Subcutaneous Q12H  . methylPREDNISolone (SOLU-MEDROL) injection  60 mg Intravenous Daily  . multivitamin with minerals  1 tablet Oral Daily  . pantoprazole (PROTONIX) IV  40 mg Intravenous Q24H  . [START ON 01/13/2020] sulfamethoxazole-trimethoprim  1 tablet Oral Once per day on Mon Wed Fri   Continuous Infusions: . sodium chloride       LOS: 2 days    Time spent: 25 to 30 minutes    Ezekiel Slocumb, DO [facility]   If 7PM-7AM, please contact night-coverage [email or web address] 01/12/2020, 7:39 AM

## 2020-01-12 NOTE — Progress Notes (Signed)
Christine Rice  MRN: [id number]  DOB/AGE: [birth year] 39 y.o.  Primary Care Physician:Tower, Wynelle Fanny, MD  Admit date: 01/10/2020  Chief Complaint:  Chief Complaint  Patient presents with  . Emesis    S-Pt presented on  01/10/2020 with  Chief Complaint  Patient presents with  . Emesis  . Pt main complaint was " I still feel bloated". I discussed pt labs and educated pt about her kidney related issues.    Medications . furosemide  40 mg Intravenous Q12H  . heparin  5,000 Units Subcutaneous Q12H  . methylPREDNISolone (SOLU-MEDROL) injection  60 mg Intravenous Daily  . multivitamin with minerals  1 tablet Oral Daily  . pantoprazole (PROTONIX) IV  40 mg Intravenous Q24H  . [START ON 01/13/2020] sulfamethoxazole-trimethoprim  1 tablet Oral Once per day on Mon Wed Fri         [id number] from the symptoms mentioned above,there are no other symptoms referable to all systems reviewed.  Physical Exam: Vital signs in last 24 hours: Temp:  [97.8 F (36.6 C)-98.5 F (36.9 C)] 98.4 F (36.9 C) (01/31 0448) Pulse Rate:  [69-82] 71 (01/31 0448) Resp:  [16-18] 16 (01/31 0448) BP: (143-158)/(81-99) 145/81 (01/31 0448) SpO2:  [97 %-100 %] 97 % (01/31 0448) Weight change:  Last BM Date: 01/11/20(charted in error )  Intake/Output from previous day: 01/30 0701 - 01/31 0700 In: 1121.8 [P.O.:240; I.V.:781.8; IV Piggyback:100] Out: 1100 [Urine:1100] Total I/O In: -  Out: 400 [Urine:400]   Physical Exam: General- pt is awake,alert, oriented to time place and person Resp- No acute REsp distress, CTA B/L NO Rhonchi CVS- S1S2 regular in rate and rhythm GIT- BS+, soft, NT, ND EXT- 2+ LE Edema, no Cyanosis   Lab Results: CBC Recent Labs    01/10/20 1325 01/11/20 0455  WBC 25.1* 10.9*  HGB 14.3 12.2  HCT 40.9 35.5*  PLT 313 280    BMET Recent Labs    01/11/20 0455 01/12/20 0517  NA 131* 132*  K 4.7 5.0  CL 104 104  CO2 24 22  GLUCOSE 93 96  BUN 63* 65*   CREATININE 3.81* 2.80*  CALCIUM 7.6* 7.7*    Creatinine trend 2021  1.1==>3.8==>2.8   Sodium trend 134==>131=>132    MICRO Recent Results (from the past 240 hour(s))  SARS CORONAVIRUS 2 (TAT 6-24 HRS) Nasopharyngeal Nasopharyngeal Swab     Status: None   Collection Time: 01/03/20  5:18 PM   Specimen: Nasopharyngeal Swab  Result Value Ref Range Status   SARS Coronavirus 2 NEGATIVE NEGATIVE Final    Comment: (NOTE) SARS-CoV-2 target nucleic acids are NOT DETECTED. The SARS-CoV-2 RNA is generally detectable in upper and lower respiratory specimens during the acute phase of infection. Negative results do not preclude SARS-CoV-2 infection, do not rule out co-infections with other pathogens, and should not be used as the sole basis for treatment or other patient management decisions. Negative results must be combined with clinical observations, patient history, and epidemiological information. The expected result is Negative. Fact Sheet for Patients: SugarRoll.be Fact Sheet for Healthcare Providers: https://www.woods-mathews.com/ This test is not yet approved or cleared by the [place] FDA and  has been authorized for detection and/or diagnosis of SARS-CoV-2 by FDA under an Emergency Use Authorization (EUA). This EUA will remain  in effect (meaning this test can be used) for the duration of the COVID-19 declaration under Section 56 4(b)(1) of the Act, 21 U.S.C. section 360bbb-3(b)(1), unless the authorization is terminated or revoked sooner. Performed  at [facility], [place] [address], [place], [place] [address]   Urine Culture     Status: Abnormal   Collection Time: 01/03/20  5:48 PM   Specimen: Urine, Random  Result Value Ref Range Status   Specimen Description   Final    URINE, RANDOM Performed at [facility], [address], [place], [place] [address]    Special Requests   Final    NONE Performed at  [facility], [address] [place], [place] [address]    Culture MULTIPLE SPECIES PRESENT, SUGGEST RECOLLECTION (A)  Final   Report Status 01/06/2020 FINAL  Final  Respiratory Panel by RT PCR (Flu A&B, Covid) - Nasopharyngeal Swab     Status: None   Collection Time: 01/10/20  5:34 PM   Specimen: Nasopharyngeal Swab  Result Value Ref Range Status   SARS Coronavirus 2 by RT PCR NEGATIVE NEGATIVE Final    Comment: (NOTE) SARS-CoV-2 target nucleic acids are NOT DETECTED. The SARS-CoV-2 RNA is generally detectable in upper respiratoy specimens during the acute phase of infection. The lowest concentration of SARS-CoV-2 viral copies this assay can detect is 131 copies/mL. A negative result does not preclude SARS-Cov-2 infection and should not be used as the sole basis for treatment or other patient management decisions. A negative result may occur with  improper specimen collection/handling, submission of specimen other than nasopharyngeal swab, presence of viral mutation(s) within the areas targeted by this assay, and inadequate number of viral copies (<131 copies/mL). A negative result must be combined with clinical observations, patient history, and epidemiological information. The expected result is Negative. Fact Sheet for Patients:  PinkCheek.be Fact Sheet for Healthcare Providers:  GravelBags.it This test is not yet ap proved or cleared by the [place] FDA and  has been authorized for detection and/or diagnosis of SARS-CoV-2 by FDA under an Emergency Use Authorization (EUA). This EUA will remain  in effect (meaning this test can be used) for the duration of the COVID-19 declaration under Section 564(b)(1) of the Act, 21 U.S.C. section 360bbb-3(b)(1), unless the authorization is terminated or revoked sooner.    Influenza A by PCR NEGATIVE NEGATIVE Final   Influenza B by PCR NEGATIVE NEGATIVE Final     Comment: (NOTE) The Xpert Xpress SARS-CoV-2/FLU/RSV assay is intended as an aid in  the diagnosis of influenza from Nasopharyngeal swab specimens and  should not be used as a sole basis for treatment. Nasal washings and  aspirates are unacceptable for Xpert Xpress SARS-CoV-2/FLU/RSV  testing. Fact Sheet for Patients: PinkCheek.be Fact Sheet for Healthcare Providers: GravelBags.it This test is not yet approved or cleared by the [place] FDA and  has been authorized for detection and/or diagnosis of SARS-CoV-2 by  FDA under an Emergency Use Authorization (EUA). This EUA will remain  in effect (meaning this test can be used) for the duration of the  Covid-19 declaration under Section 564(b)(1) of the Act, 21  U.S.C. section 360bbb-3(b)(1), unless the authorization is  terminated or revoked. Performed at [facility], [address] [place], [place] [address]       Lab Results  Component Value Date   CALCIUM 7.7 (L) 01/12/2020      Autoimmune work-up  M spike negative  Results for AELYN, REQUEJO (MRN [id number]) as of 01/11/2020 09:42  Ref. Range 01/01/2020 19:00 01/01/2020 19:31  Anti Nuclear Antibody (ANA) Latest Ref Range: Negative   Negative  Cytoplasmic (C-ANCA) Latest Ref Range: Neg:<1:20 titer  <1:20  P-ANCA Latest Ref Range: Neg:<1:20 titer  <1:20  Atypical P-ANCA titer Latest Ref Range: Neg:<1:20 titer  <1:20  C3 Complement Latest Ref Range: 82 - 167 mg/dL 111   Complement C4, Body Fluid Latest Ref Range: 12 - 38 mg/dL 23   Kappa free light chain Latest Ref Range: 3.3 - 19.4 mg/L  75.2 (H)  Lamda free light chains Latest Ref Range: 5.7 - 26.3 mg/L  51.9 (H)  Kappa, lamda light chain ratio Latest Ref Range: 0.26 - 1.65   1.45         Impression: 1)Renal  AKI secondary to minimal-change disease/ATN AKI on CKD CKD stage 3. 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   Progression of CKD marked with AKI  AKI now better Creat trending down    2)  Proteinura  Patient has nephrotic range proteinuria secondary to minimal-change disease Patient is on steroids  3)HTN Blood pressure at goal   3) elevated WBC counts Secondary to steroids  4) hyponatremia Hypervolemic hyponatremia patient on diuretics     5) anasarca Secondary to nephrotic syndrome Patient is on IV steroids patient on diuretics  6) infection prevention  patient on  Bactrim  7) GI prevention Patient on Protonix    Plan:  Anasarca/Hyponatremia We continue patient on IV diuretics  Infection prevention We will continue patient on Bactrim  GI prevention Patient on Protonix  Proteinuria Will start RAS blockers as outpt Pt in AKI -so cannot start now     I again educated pt at length about kidney realted issues and answered her queries to best of my ability     Elishia Kaczorowski s Theador Hawthorne 01/12/2020, 1:55 PM

## 2020-01-12 NOTE — Consult Note (Signed)
Reason for Consult: headaches  Requesting Physician: Dr. Arbutus Ped  CC: headaches   HPI: Christine Rice is an 39 y.o. female with medical history significant of recently diagnosed minimal change disease/nephrotic syndrome, anxiety who presented to the ED today with intractable vomiting and severe headache.  She was admitted for similar issues from 1/22 to 1/26, discharge on Demadex and PRN oral clonidine.   She reports onset of intermittent vomiting one week ago, but it worsened on admission.  Patient doesn't have any prior history of headaches. Currently improved and is headache free. Was started on steroids by nephrology.  Headaches are sharp non radiating, diffuse with N/V. No true migraine symptoms.   Past Medical History:  Diagnosis Date  . Anxiety   . Nephrotic syndrome     Past Surgical History:  Procedure Laterality Date  . CESAREAN SECTION  01/06/05  . CESAREAN SECTION  01/19/2012   Procedure: CESAREAN SECTION;  Surgeon: Luz Lex, MD;  Location: [facility];  Service: Gynecology;  Laterality: N/A;  repeat  . HERNIA REPAIR    . INGUINAL HERNIA REPAIR  01/19/2012   Procedure: HERNIA REPAIR INGUINAL ADULT BILATERAL;  Surgeon: Adin Hector, MD;  Location: [facility];  Service: General;  Laterality: Bilateral;  . RENAL BIOPSY, PERCUTANEOUS  01/06/2020      . tummy tuck    . UMBILICAL HERNIA REPAIR  01/19/2012   Procedure: HERNIA REPAIR UMBILICAL ADULT;  Surgeon: Adin Hector, MD;  Location: [facility];  Service: General;  Laterality: N/A;  . UMBILICAL HERNIA REPAIR  11-19-14    Family History  Problem Relation Age of Onset  . Rashes / Skin problems Mother   . COPD Father   . Breast cancer Neg Hx   . Cancer Neg Hx   . Stroke Neg Hx     Social History:  reports that she quit smoking about 8 weeks ago. Her smoking use included cigarettes. She has never used smokeless tobacco. She reports current alcohol use. She reports current drug use. Drug: Marijuana.  Allergies  Allergen Reactions   . Amoxicillin Hives    REACTION: rash  . Penicillins Hives    Medications: I have reviewed the patient's current medications.  ROS: History obtained from the patient  General ROS: negative for - chills, fatigue, fever, night sweats, weight gain or weight loss Psychological anxiety Ophthalmic ROS: negative for - blurry vision, double vision, eye pain or loss of vision ENT ROS: negative for - epistaxis, nasal discharge, oral lesions, sore throat, tinnitus or vertigo Allergy and Immunology ROS: negative for - hives or itchy/watery eyes Hematological and Lymphatic ROS: negative for - bleeding problems, bruising or swollen lymph nodes Endocrine ROS: negative for - galactorrhea, hair pattern changes, polydipsia/polyuria or temperature intolerance Respiratory ROS: negative for - cough, hemoptysis, shortness of breath or wheezing Cardiovascular ROS: negative for - chest pain, dyspnea on exertion, edema or irregular heartbeat Gastrointestinal ROS: negative for - abdominal pain, diarrhea, hematemesis, nausea/vomiting or stool incontinence Genito-Urinary ROS: negative for - dysuria, hematuria, incontinence or urinary frequency/urgency Musculoskeletal ROS: negative for - joint swelling or muscular weakness Neurological ROS: as noted in HPI Dermatological ROS: negative for rash and skin lesion changes  Physical Examination: Blood pressure (!) 145/81, pulse 71, temperature 98.4 F (36.9 C), temperature source Oral, resp. rate 16, height 5\' 6"  (1.676 m), weight 85.1 kg, SpO2 97 %.   Neurological Examination   Mental Status: Alert, oriented, thought content appropriate.  Speech fluent without evidence of aphasia.  Able  to follow 3 step commands without difficulty. Cranial Nerves: II: Discs flat bilaterally; Visual fields grossly normal, pupils equal, round, reactive to light and accommodation III,IV, VI: ptosis not present, extra-ocular motions intact bilaterally V,VII: smile symmetric, facial  light touch sensation normal bilaterally VIII: hearing normal bilaterally IX,X: gag reflex present XI: bilateral shoulder shrug XII: midline tongue extension Motor: Right : Upper extremity   5/5    Left:     Upper extremity   5/5  Lower extremity   5/5     Lower extremity   5/5 Tone and bulk:normal tone throughout; no atrophy noted Sensory: Pinprick and light touch intact throughout, bilaterally Deep Tendon Reflexes: 2+ and symmetric throughout Plantars: Right: downgoing   Left: downgoing Cerebellar: normal finger-to-nose, normal rapid alternating movements and normal heel-to-shin test Gait: not tested      Laboratory Studies:   Basic Metabolic Panel: Recent Labs  Lab 01/06/20 0449 01/06/20 0449 01/07/20 0936 01/07/20 0936 01/10/20 1325 01/11/20 0455 01/12/20 0517  NA 134*  --  133*  --  130* 131* 132*  K 4.3  --  4.8  --  4.7 4.7 5.0  CL 107  --  102  --  101 104 104  CO2 24  --  27  --  21* 24 22  GLUCOSE 93  --  107*  --  116* 93 96  BUN 41*  --  47*  --  61* 63* 65*  CREATININE 1.57*  --  2.35*  --  3.68* 3.81* 2.80*  CALCIUM 7.4*   < > 7.6*   < > 7.9* 7.6* 7.7*  MG  --   --   --   --   --  2.4 2.7*   < > = values in this interval not displayed.    Liver Function Tests: Recent Labs  Lab 01/10/20 1325 01/11/20 0455 01/12/20 0517  AST 19 13* 14*  ALT 18 13 12   ALKPHOS 61 41 52  BILITOT 0.6 0.4 0.3  PROT 5.5* 4.6* 4.5*  ALBUMIN 2.0* 1.7* 1.6*   Recent Labs  Lab 01/10/20 1325  LIPASE 107*   No results for input(s): AMMONIA in the last 168 hours.  CBC: Recent Labs  Lab 01/06/20 0813 01/10/20 1325 01/11/20 0455  WBC 10.5 25.1* 10.9*  NEUTROABS 6.5  --   --   HGB 14.2 14.3 12.2  HCT 41.6 40.9 35.5*  MCV 89.1 89.7 89.9  PLT 210 313 280    Cardiac Enzymes: No results for input(s): CKTOTAL, CKMB, CKMBINDEX, TROPONINI in the last 168 hours.  BNP: Invalid input(s): POCBNP  CBG: No results for input(s): GLUCAP in the last 168  hours.  Microbiology: Results for orders placed or performed during the hospital encounter of 01/10/20  Respiratory Panel by RT PCR (Flu A&B, Covid) - Nasopharyngeal Swab     Status: None   Collection Time: 01/10/20  5:34 PM   Specimen: Nasopharyngeal Swab  Result Value Ref Range Status   SARS Coronavirus 2 by RT PCR NEGATIVE NEGATIVE Final    Comment: (NOTE) SARS-CoV-2 target nucleic acids are NOT DETECTED. The SARS-CoV-2 RNA is generally detectable in upper respiratoy specimens during the acute phase of infection. The lowest concentration of SARS-CoV-2 viral copies this assay can detect is 131 copies/mL. A negative result does not preclude SARS-Cov-2 infection and should not be used as the sole basis for treatment or other patient management decisions. A negative result may occur with  improper specimen collection/handling, submission of specimen other than  nasopharyngeal swab, presence of viral mutation(s) within the areas targeted by this assay, and inadequate number of viral copies (<131 copies/mL). A negative result must be combined with clinical observations, patient history, and epidemiological information. The expected result is Negative. Fact Sheet for Patients:  PinkCheek.be Fact Sheet for Healthcare Providers:  GravelBags.it This test is not yet ap proved or cleared by the [place] FDA and  has been authorized for detection and/or diagnosis of SARS-CoV-2 by FDA under an Emergency Use Authorization (EUA). This EUA will remain  in effect (meaning this test can be used) for the duration of the COVID-19 declaration under Section 564(b)(1) of the Act, 21 U.S.C. section 360bbb-3(b)(1), unless the authorization is terminated or revoked sooner.    Influenza A by PCR NEGATIVE NEGATIVE Final   Influenza B by PCR NEGATIVE NEGATIVE Final    Comment: (NOTE) The Xpert Xpress SARS-CoV-2/FLU/RSV assay is intended as an  aid in  the diagnosis of influenza from Nasopharyngeal swab specimens and  should not be used as a sole basis for treatment. Nasal washings and  aspirates are unacceptable for Xpert Xpress SARS-CoV-2/FLU/RSV  testing. Fact Sheet for Patients: PinkCheek.be Fact Sheet for Healthcare Providers: GravelBags.it This test is not yet approved or cleared by the [place] FDA and  has been authorized for detection and/or diagnosis of SARS-CoV-2 by  FDA under an Emergency Use Authorization (EUA). This EUA will remain  in effect (meaning this test can be used) for the duration of the  Covid-19 declaration under Section 564(b)(1) of the Act, 21  U.S.C. section 360bbb-3(b)(1), unless the authorization is  terminated or revoked. Performed at [facility], [address] [place], [place] [address]     Coagulation Studies: No results for input(s): LABPROT, INR in the last 72 hours.  Urinalysis:  Recent Labs  Lab 01/10/20 1734  COLORURINE YELLOW*  LABSPEC 1.019  PHURINE 5.0  GLUCOSEU NEGATIVE  HGBUR MODERATE*  BILIRUBINUR NEGATIVE  KETONESUR NEGATIVE  PROTEINUR >=300*  NITRITE NEGATIVE  LEUKOCYTESUR NEGATIVE    Lipid Panel:     Component Value Date/Time   CHOL 166 07/26/2019 1349   TRIG 60 07/26/2019 1349   HDL 46 07/26/2019 1349   CHOLHDL 3.6 07/26/2019 1349   CHOLHDL 4 07/05/2016 1030   VLDL 13.8 07/05/2016 1030   LDLCALC 108 (H) 07/26/2019 1349    HgbA1C: No results found for: HGBA1C  Urine Drug Screen:  No results found for: LABOPIA, COCAINSCRNUR, LABBENZ, AMPHETMU, THCU, LABBARB  Alcohol Level: No results for input(s): ETH in the last 168 hours.  Other results: EKG: normal EKG, normal sinus rhythm, unchanged from previous tracings.  Imaging: No results found.   Assessment/Plan:   39 y.o. female with medical history significant of recently diagnosed minimal change disease/nephrotic syndrome,  anxiety who presented to the ED today with intractable vomiting and severe headache.  She was admitted for similar issues from 1/22 to 1/26, discharge on Demadex and PRN oral clonidine.   She reports onset of intermittent vomiting one week ago, but it worsened on admission.  Patient doesn't have any prior history of headaches. Currently improved and is headache free. Was started on steroids by nephrology.  Headaches are sharp non radiating, diffuse with N/V. No true migraine symptoms.   -Headache related to hypertensive urgency - She states she was given clonidine PRN on d/c which might not be best idea due to withdrawal elevated BP after stopping it - this does not appear to be a migraine - currently improved  and headache free. No N/V - Steroids as per nephrology which should help with the headaches as well - Possibly over the counter magnesium 500-600mg  tab and daily Vitamin B complex as out patient but that is usually used for migraine headaches which these are not -  CTH reviewed. No further imaging from neurological stand point.    01/12/2020, 11:46 AM

## 2020-01-13 DIAGNOSIS — E8809 Other disorders of plasma-protein metabolism, not elsewhere classified: Secondary | ICD-10-CM

## 2020-01-13 LAB — COMPREHENSIVE METABOLIC PANEL
ALT: 13 U/L (ref 0–44)
AST: 13 U/L — ABNORMAL LOW (ref 15–41)
Albumin: 1.5 g/dL — ABNORMAL LOW (ref 3.5–5.0)
Alkaline Phosphatase: 51 U/L (ref 38–126)
Anion gap: 6 (ref 5–15)
BUN: 70 mg/dL — ABNORMAL HIGH (ref 6–20)
CO2: 22 mmol/L (ref 22–32)
Calcium: 7.6 mg/dL — ABNORMAL LOW (ref 8.9–10.3)
Chloride: 102 mmol/L (ref 98–111)
Creatinine, Ser: 2.11 mg/dL — ABNORMAL HIGH (ref 0.44–1.00)
GFR calc Af Amer: 34 mL/min — ABNORMAL LOW (ref >60)
GFR calc non Af Amer: 29 mL/min — ABNORMAL LOW (ref >60)
Glucose, Bld: 91 mg/dL (ref 70–99)
Potassium: 5 mmol/L (ref 3.5–5.1)
Sodium: 130 mmol/L — ABNORMAL LOW (ref 135–145)
Total Bilirubin: 0.4 mg/dL (ref 0.3–1.2)
Total Protein: 4.2 g/dL — ABNORMAL LOW (ref 6.5–8.1)

## 2020-01-13 MED ORDER — FUROSEMIDE 10 MG/ML IJ SOLN
80.0000 mg | Freq: Two times a day (BID) | INTRAMUSCULAR | Status: DC
  Administered 2020-01-13 – 2020-01-15 (×4): 80 mg via INTRAVENOUS
  Filled 2020-01-13 (×4): qty 8

## 2020-01-13 MED ORDER — ALBUMIN HUMAN 25 % IV SOLN
25.0000 g | Freq: Three times a day (TID) | INTRAVENOUS | Status: DC
  Administered 2020-01-13 – 2020-01-15 (×7): 25 g via INTRAVENOUS
  Filled 2020-01-13 (×11): qty 100

## 2020-01-13 NOTE — Progress Notes (Signed)
[facility]  ROUNDING NOTE   Subjective:   Patient states she is not urinating and having abdominal pains. She states her edema is the same.   Patient states her nausea/vomiting and headaches are better.   Creatinine 2.11 (2.8)  Objective:  Vital signs in last 24 hours:  Temp:  [97.4 F (36.3 C)-98.1 F (36.7 C)] 97.8 F (36.6 C) (02/01 1204) Pulse Rate:  [67-90] 67 (02/01 1204) Resp:  [16-20] 16 (02/01 1204) BP: (137-157)/(80-106) 150/89 (02/01 1310) SpO2:  [95 %-99 %] 96 % (02/01 1204) Weight:  [85.8 kg] 85.8 kg (01/31 2245)  Weight change:  Filed Weights   01/10/20 1324 01/10/20 1956 01/12/20 2245  Weight: 81.2 kg 85.1 kg 85.8 kg    Intake/Output: I/O last 3 completed shifts: In: 157 [I.V.:157] Out: 2425 [Urine:2425]   Intake/Output this shift:  Total I/O In: 240 [P.O.:240] Out: 275 [Urine:275]  Physical Exam: General: NAD,   Head: Normocephalic, atraumatic. Moist oral mucosal membranes  Eyes: Anicteric, PERRL  Neck: Supple, trachea midline  Lungs:  Clear to auscultation  Heart: Regular rate and rhythm  Abdomen:  Soft, nontender, +abdominal wall edema  Extremities: ++ peripheral edema.  Neurologic: Nonfocal, moving all four extremities  Skin: No lesions        Basic Metabolic Panel: Recent Labs  Lab 01/07/20 0936 01/07/20 0936 01/10/20 1325 01/10/20 1325 01/11/20 0455 01/12/20 0517 01/13/20 0527  NA 133*  --  130*  --  131* 132* 130*  K 4.8  --  4.7  --  4.7 5.0 5.0  CL 102  --  101  --  104 104 102  CO2 27  --  21*  --  24 22 22   GLUCOSE 107*  --  116*  --  93 96 91  BUN 47*  --  61*  --  63* 65* 70*  CREATININE 2.35*  --  3.68*  --  3.81* 2.80* 2.11*  CALCIUM 7.6*   < > 7.9*   < > 7.6* 7.7* 7.6*  MG  --   --   --   --  2.4 2.7*  --    < > = values in this interval not displayed.    Liver Function Tests: Recent Labs  Lab 01/10/20 1325 01/11/20 0455 01/12/20 0517 01/13/20 0527  AST 19 13* 14* 13*  ALT 18 13 12 13    ALKPHOS 61 41 52 51  BILITOT 0.6 0.4 0.3 0.4  PROT 5.5* 4.6* 4.5* 4.2*  ALBUMIN 2.0* 1.7* 1.6* 1.5*   Recent Labs  Lab 01/10/20 1325  LIPASE 107*   No results for input(s): AMMONIA in the last 168 hours.  CBC: Recent Labs  Lab 01/10/20 1325 01/11/20 0455  WBC 25.1* 10.9*  HGB 14.3 12.2  HCT 40.9 35.5*  MCV 89.7 89.9  PLT 313 280    Cardiac Enzymes: No results for input(s): CKTOTAL, CKMB, CKMBINDEX, TROPONINI in the last 168 hours.  BNP: Invalid input(s): POCBNP  CBG: No results for input(s): GLUCAP in the last 168 hours.  Microbiology: Results for orders placed or performed during the hospital encounter of 01/10/20  Respiratory Panel by RT PCR (Flu A&B, Covid) - Nasopharyngeal Swab     Status: None   Collection Time: 01/10/20  5:34 PM   Specimen: Nasopharyngeal Swab  Result Value Ref Range Status   SARS Coronavirus 2 by RT PCR NEGATIVE NEGATIVE Final    Comment: (NOTE) SARS-CoV-2 target nucleic acids are NOT DETECTED. The SARS-CoV-2 RNA is generally detectable  in upper respiratoy specimens during the acute phase of infection. The lowest concentration of SARS-CoV-2 viral copies this assay can detect is 131 copies/mL. A negative result does not preclude SARS-Cov-2 infection and should not be used as the sole basis for treatment or other patient management decisions. A negative result may occur with  improper specimen collection/handling, submission of specimen other than nasopharyngeal swab, presence of viral mutation(s) within the areas targeted by this assay, and inadequate number of viral copies (<131 copies/mL). A negative result must be combined with clinical observations, patient history, and epidemiological information. The expected result is Negative. Fact Sheet for Patients:  PinkCheek.be Fact Sheet for Healthcare Providers:  GravelBags.it This test is not yet ap proved or cleared by the [place] FDA and  has been authorized for detection and/or diagnosis of SARS-CoV-2 by FDA under an Emergency Use Authorization (EUA). This EUA will remain  in effect (meaning this test can be used) for the duration of the COVID-19 declaration under Section 564(b)(1) of the Act, 21 U.S.C. section 360bbb-3(b)(1), unless the authorization is terminated or revoked sooner.    Influenza A by PCR NEGATIVE NEGATIVE Final   Influenza B by PCR NEGATIVE NEGATIVE Final    Comment: (NOTE) The Xpert Xpress SARS-CoV-2/FLU/RSV assay is intended as an aid in  the diagnosis of influenza from Nasopharyngeal swab specimens and  should not be used as a sole basis for treatment. Nasal washings and  aspirates are unacceptable for Xpert Xpress SARS-CoV-2/FLU/RSV  testing. Fact Sheet for Patients: PinkCheek.be Fact Sheet for Healthcare Providers: GravelBags.it This test is not yet approved or cleared by the [place] FDA and  has been authorized for detection and/or diagnosis of SARS-CoV-2 by  FDA under an Emergency Use Authorization (EUA). This EUA will remain  in effect (meaning this test can be used) for the duration of the  Covid-19 declaration under Section 564(b)(1) of the Act, 21  U.S.C. section 360bbb-3(b)(1), unless the authorization is  terminated or revoked. Performed at [facility], [address] [place], [place] [address]     Coagulation Studies: No results for input(s): LABPROT, INR in the last 72 hours.  Urinalysis: Recent Labs    01/10/20 1734  COLORURINE YELLOW*  LABSPEC 1.019  PHURINE 5.0  GLUCOSEU NEGATIVE  HGBUR MODERATE*  BILIRUBINUR NEGATIVE  KETONESUR NEGATIVE  PROTEINUR >=300*  NITRITE NEGATIVE  LEUKOCYTESUR NEGATIVE      Imaging: No results found.   Medications:   . albumin human     . furosemide  80 mg Intravenous BID  . heparin  5,000 Units Subcutaneous Q12H  . methylPREDNISolone  (SOLU-MEDROL) injection  60 mg Intravenous Daily  . multivitamin with minerals  1 tablet Oral Daily  . pantoprazole (PROTONIX) IV  40 mg Intravenous Q24H  . sulfamethoxazole-trimethoprim  1 tablet Oral Once per day on Mon Wed Fri   ALPRAZolam, alum & mag hydroxide-simeth, bisacodyl, calcium carbonate, hydrALAZINE, labetalol, ondansetron **OR** ondansetron (ZOFRAN) IV, phenol, polyethylene glycol, promethazine, traMADol, traZODone  Assessment/ Plan:  Ms. Christine Rice is a 39 y.o. white female with IBS, ADHD, depression with new onset nephrotic syndrome. 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  1. Acute renal failure 2. Anasarca/hypoalbuminemia 3. Nephrotic range proteinuria 4. Hematuria  5. Hypertension 6. Headache 7. Nausea/vomiting   Suspect headache, nausea and vomiting are secondary to hypertensive urgency. Have improved with blood pressure control   Plan  - IV furosemide: increase to 80mg  bid.  - Stress dose steroida - IV  albumin - TMP/SMX for infection prophylaxis.    LOS: 3 Laurelai Lepp 2/1/20213:12 PM

## 2020-01-13 NOTE — Progress Notes (Signed)
PROGRESS NOTE    Christine Rice  [id number] DOB: 25-Nov-1981 DOA: 01/10/2020  PCP: Abner Greenspan, MD    LOS - 3   Brief Narrative:  Christine Rice a 39 y.o.femalewith medical history significant ofrecently diagnosed minimal change disease (biopsied last weekand just started steroid), anxiety who presented to the ED1/29with intractable vomiting and severe headache.Recent admission for samefrom 1/22 to 1/26. BP also uncontrolled on presentation, as patient unable to take her PRN oral clonidine. In the ED, Patient admitted with nephrology consulted. hypertensive 150/104 ->165/113 otherwise stable vitals. Labs notable for Na 130, CO2 21, BUN 61, Cr 3.68 (from 2.35 yesterday and 1.57 on 1/25), lipase 107, albumin 2.0, WBC 25k. Flu A/B and Covid-19 negative. Nephrology was consulted by ED physician, recommended admission for IV steroids, BP control, and gentle fluids for AKI.  Subjective 2/1: Patient seen today at bedside.  No acute events reported overnight.  She reports feeling so-so today.  No nausea or vomiting.  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  Denies headache, states they seem to happen when she gets worked up.  Feels like leg swelling has increased further.  Denies any fevers or chills, nausea vomiting or diarrhea, chest pain or shortness of breath.  Assessment & Plan:   Principal Problem:   Acute kidney injury ([place]) Active Problems:   Intractable vomiting with nausea   Hypertensive urgency   Headache   Hyponatremia   Nephrotic syndrome, minimal change   Leukocytosis   Hypoalbuminemia   Anxiety disorder   Acute Kidney Injury-Present on admission, superimposed on nephrotic syndrome.  Most likely pre-renal given vomiting and secondary dehydration. Minimal Change Disease / Nephrotic Syndrome --Nephrology following --Continuing IV Lasix - increased today --IV albumin today --IV  steroids per nephro --IV PPI for GI protection - increased to BID given ongoing heartburn not relieved with tums or maalox --Bactrim for infection prevention while on steroids --daily BMP's   Hypertensive Urgency - present on admission, improved. Had been discharged with PRN oral clonidine for uncontrolled BP, but unable to tolerate PO intake.  Suspect this may be causing her headache.  Use of clonidine also has issue of rebound hypertension. --Hydralazine and/or Labetalol IV PRN --may need to be started on scheduled med, will monitor  Heartburn -new onset evening of 1/30 --As needed Mylanta ordered --Is on IV PPI for GI prophylaxis  Intractable nausea/vomiting- resolved Unclear cause at this point, possibly uremia or hypertensive urgency. ?New medications but doubtful as onset of N/V was before starting Prednisone, and she has tolerated Demadex without issues before N/V started. Lipase mildly elevated and with epigastric tenderness, could have mild case of acute pancreatitis. --IV Zofran and/or Phenergan PRN --Diet as tolerated  Headache - resolved.  Suspect due to uncontrolled hypertension. Improved once BP better controlled. --acetaminophen IV PRN --cool compresses PRN, room dark, quiet environment --Neurology consulted and also attributes her headaches to hypertensive urgency.  Recommend considering magnesium and B12 supplements.  Will discuss with patient.  Hyponatremia, mild, Na 130 on admission.  Improved.  Given anasarca due to MCD, suspect hypervolemic.Expect improvement diuretics.   --daily BMP's  Non-anion gap metabolic acidosis -resolved.  Was most likely due to N/V --monitor   Hypoalbuminemia- secondary to MCD  Leukocytosis -Improved.WBC 25k on admission, no evidence of infection.  Suspect reactive in setting in N/V in addition to steroids. --Monitor CBC --Monitor for s/sx's of infection  Anxiety Disorder - continue home Xanax  PRN    DVT prophylaxis:heparin Code Status: Full Code Family Communication:none at bedside during encounter Disposition Plan:Expect d/c home pending improvement and cleared by nephrology.  Expect to be discharged home without any anticipated barriers at this time.   Consultants:  Nephrology  Procedures:  none  Antimicrobials:  Bactrim for prophylaxis while on steroids   Objective: Vitals:   01/13/20 0921 01/13/20 1026 01/13/20 1204 01/13/20 1310  BP: (!) 137/91 (!) 144/80 (!) 157/91 (!) 150/89  Pulse:   67   Resp:   16   Temp:   97.8 F (36.6 C)   TempSrc:   Oral   SpO2:   96%   Weight:      Height:        Intake/Output Summary (Last 24 hours) at 01/13/2020 1559 Last data filed at 01/13/2020 1300 Gross per 24 hour  Intake 360 ml  Output 2150 ml  Net -1790 ml   Filed Weights   01/10/20 1324 01/10/20 1956 01/12/20 2245  Weight: 81.2 kg 85.1 kg 85.8 kg    Examination:  General exam: awake, alert, no acute distress, conversational HEENT: clear conjunctiva, anicteric sclera, moist mucus membranes, hearing grossly normal  Respiratory system: clear to auscultation bilaterally, no wheezes, rales or rhonchi, normal respiratory effort. Cardiovascular system: normal S1/S2, RRR, no JVD, murmurs, rubs, gallops, 2+ pitting edema BLE's.   Central nervous system: alert and oriented x4. no gross focal neurologic deficits, normal speech Extremities: moves all, no cyanosis, normal tone Skin: dry, intact, normal temperature, normal color Psychiatry: normal mood, congruent affect, judgement and insight appear normal    Data Reviewed: I have personally reviewed following labs and imaging studies  CBC: Recent Labs  Lab 01/10/20 1325 01/11/20 0455  WBC 25.1* 10.9*  HGB 14.3 12.2  HCT 40.9 35.5*  MCV 89.7 89.9  PLT 313 [id number]   Basic Metabolic Panel: Recent Labs  Lab 01/07/20 0936 01/10/20 1325 01/11/20 0455 01/12/20 0517 01/13/20 0527  NA 133*  130* 131* 132* 130*  K 4.8 4.7 4.7 5.0 5.0  CL 102 101 104 104 102  CO2 27 21* 24 22 22   GLUCOSE 107* 116* 93 96 91  BUN 47* 61* 63* 65* 70*  CREATININE 2.35* 3.68* 3.81* 2.80* 2.11*  CALCIUM 7.6* 7.9* 7.6* 7.7* 7.6*  MG  --   --  2.4 2.7*  --    GFR: Estimated Creatinine Clearance: 39.9 mL/min (A) (by C-G formula based on SCr of 2.11 mg/dL (H)). Liver Function Tests: Recent Labs  Lab 01/10/20 1325 01/11/20 0455 01/12/20 0517 01/13/20 0527  AST 19 13* 14* 13*  ALT 18 13 12 13   ALKPHOS 61 41 52 51  BILITOT 0.6 0.4 0.3 0.4  PROT 5.5* 4.6* 4.5* 4.2*  ALBUMIN 2.0* 1.7* 1.6* 1.5*   Recent Labs  Lab 01/10/20 1325  LIPASE 107*   No results for input(s): AMMONIA in the last 168 hours. Coagulation Profile: No results for input(s): INR, PROTIME in the last 168 hours. Cardiac Enzymes: No results for input(s): CKTOTAL, CKMB, CKMBINDEX, TROPONINI in the last 168 hours. BNP (last 3 results) No results for input(s): PROBNP in the last 8760 hours. HbA1C: No results for input(s): HGBA1C in the last 72 hours. CBG: No results for input(s): GLUCAP in the last 168 hours. Lipid Profile: No results for input(s): CHOL, HDL, LDLCALC, TRIG, CHOLHDL, LDLDIRECT in the last 72 hours. Thyroid Function Tests: No results for input(s): TSH, T4TOTAL, FREET4, T3FREE, THYROIDAB in the last 72 hours. Anemia Panel: No results  for input(s): VITAMINB12, FOLATE, FERRITIN, TIBC, IRON, RETICCTPCT in the last 72 hours. Sepsis Labs: No results for input(s): PROCALCITON, LATICACIDVEN in the last 168 hours.  Recent Results (from the past 240 hour(s))  SARS CORONAVIRUS 2 (TAT 6-24 HRS) Nasopharyngeal Nasopharyngeal Swab     Status: None   Collection Time: 01/03/20  5:18 PM   Specimen: Nasopharyngeal Swab  Result Value Ref Range Status   SARS Coronavirus 2 NEGATIVE NEGATIVE Final    Comment: (NOTE) SARS-CoV-2 target nucleic acids are NOT DETECTED. The SARS-CoV-2 RNA is generally detectable in upper and  lower respiratory specimens during the acute phase of infection. Negative results do not preclude SARS-CoV-2 infection, do not rule out co-infections with other pathogens, and should not be used as the sole basis for treatment or other patient management decisions. Negative results must be combined with clinical observations, patient history, and epidemiological information. The expected result is Negative. Fact Sheet for Patients: SugarRoll.be Fact Sheet for Healthcare Providers: https://www.woods-mathews.com/ This test is not yet approved or cleared by the [place] FDA and  has been authorized for detection and/or diagnosis of SARS-CoV-2 by FDA under an Emergency Use Authorization (EUA). This EUA will remain  in effect (meaning this test can be used) for the duration of the COVID-19 declaration under Section 56 4(b)(1) of the Act, 21 U.S.C. section 360bbb-3(b)(1), unless the authorization is terminated or revoked sooner. Performed at [facility], [address] [place], [place] [address]   Urine Culture     Status: Abnormal   Collection Time: 01/03/20  5:48 PM   Specimen: Urine, Random  Result Value Ref Range Status   Specimen Description   Final    URINE, RANDOM Performed at [facility], [address], [place], [place] [address]    Special Requests   Final    NONE Performed at [facility], [address] [place], [place] [address]    Culture MULTIPLE SPECIES PRESENT, SUGGEST RECOLLECTION (A)  Final   Report Status 01/06/2020 FINAL  Final  Respiratory Panel by RT PCR (Flu A&B, Covid) - Nasopharyngeal Swab     Status: None   Collection Time: 01/10/20  5:34 PM   Specimen: Nasopharyngeal Swab  Result Value Ref Range Status   SARS Coronavirus 2 by RT PCR NEGATIVE NEGATIVE Final    Comment: (NOTE) SARS-CoV-2 target nucleic acids are NOT DETECTED. The SARS-CoV-2 RNA is generally detectable in upper  respiratoy specimens during the acute phase of infection. The lowest concentration of SARS-CoV-2 viral copies this assay can detect is 131 copies/mL. A negative result does not preclude SARS-Cov-2 infection and should not be used as the sole basis for treatment or other patient management decisions. A negative result may occur with  improper specimen collection/handling, submission of specimen other than nasopharyngeal swab, presence of viral mutation(s) within the areas targeted by this assay, and inadequate number of viral copies (<131 copies/mL). A negative result must be combined with clinical observations, patient history, and epidemiological information. The expected result is Negative. Fact Sheet for Patients:  PinkCheek.be Fact Sheet for Healthcare Providers:  GravelBags.it This test is not yet ap proved or cleared by the [place] FDA and  has been authorized for detection and/or diagnosis of SARS-CoV-2 by FDA under an Emergency Use Authorization (EUA). This EUA will remain  in effect (meaning this test can be used) for the duration of the COVID-19 declaration under Section 564(b)(1) of the Act, 21 U.S.C. section 360bbb-3(b)(1), unless the authorization is terminated  or revoked sooner.    Influenza A by PCR NEGATIVE NEGATIVE Final   Influenza B by PCR NEGATIVE NEGATIVE Final    Comment: (NOTE) The Xpert Xpress SARS-CoV-2/FLU/RSV assay is intended as an aid in  the diagnosis of influenza from Nasopharyngeal swab specimens and  should not be used as a sole basis for treatment. Nasal washings and  aspirates are unacceptable for Xpert Xpress SARS-CoV-2/FLU/RSV  testing. Fact Sheet for Patients: PinkCheek.be Fact Sheet for Healthcare Providers: GravelBags.it This test is not yet approved or cleared by the [place] FDA and  has been authorized for  detection and/or diagnosis of SARS-CoV-2 by  FDA under an Emergency Use Authorization (EUA). This EUA will remain  in effect (meaning this test can be used) for the duration of the  Covid-19 declaration under Section 564(b)(1) of the Act, 21  U.S.C. section 360bbb-3(b)(1), unless the authorization is  terminated or revoked. Performed at [facility], [address] [place], [place] [address]          Radiology Studies: No results found.      Scheduled Meds: . furosemide  80 mg Intravenous BID  . heparin  5,000 Units Subcutaneous Q12H  . methylPREDNISolone (SOLU-MEDROL) injection  60 mg Intravenous Daily  . multivitamin with minerals  1 tablet Oral Daily  . pantoprazole (PROTONIX) IV  40 mg Intravenous Q24H  . sulfamethoxazole-trimethoprim  1 tablet Oral Once per day on Mon Wed Fri   Continuous Infusions: . albumin human 25 g (01/13/20 1542)     LOS: 3 days    Time spent: 30-35 minutes    Ezekiel Slocumb, DO [facility]   If 7PM-7AM, please contact night-coverage [email or web address] 01/13/2020, 3:59 PM

## 2020-01-14 ENCOUNTER — Encounter: Payer: Self-pay | Admitting: Nephrology

## 2020-01-14 DIAGNOSIS — N050 Unspecified nephritic syndrome with minor glomerular abnormality: Secondary | ICD-10-CM

## 2020-01-14 LAB — CBC
HCT: 34.4 % — ABNORMAL LOW (ref 36.0–46.0)
Hemoglobin: 12 g/dL (ref 12.0–15.0)
MCH: 31.1 pg (ref 26.0–34.0)
MCHC: 34.9 g/dL (ref 30.0–36.0)
MCV: 89.1 fL (ref 80.0–100.0)
Platelets: 324 10*3/uL (ref 150–400)
RBC: 3.86 MIL/uL — ABNORMAL LOW (ref 3.87–5.11)
RDW: 11.9 % (ref 11.5–15.5)
WBC: 12 10*3/uL — ABNORMAL HIGH (ref 4.0–10.5)
nRBC: 0 % (ref 0.0–0.2)

## 2020-01-14 LAB — COMPREHENSIVE METABOLIC PANEL
ALT: 11 U/L (ref 0–44)
AST: 11 U/L — ABNORMAL LOW (ref 15–41)
Albumin: 2 g/dL — ABNORMAL LOW (ref 3.5–5.0)
Alkaline Phosphatase: 44 U/L (ref 38–126)
Anion gap: 4 — ABNORMAL LOW (ref 5–15)
BUN: 66 mg/dL — ABNORMAL HIGH (ref 6–20)
CO2: 25 mmol/L (ref 22–32)
Calcium: 7.9 mg/dL — ABNORMAL LOW (ref 8.9–10.3)
Chloride: 104 mmol/L (ref 98–111)
Creatinine, Ser: 1.79 mg/dL — ABNORMAL HIGH (ref 0.44–1.00)
GFR calc Af Amer: 41 mL/min — ABNORMAL LOW (ref >60)
GFR calc non Af Amer: 35 mL/min — ABNORMAL LOW (ref >60)
Glucose, Bld: 99 mg/dL (ref 70–99)
Potassium: 5.1 mmol/L (ref 3.5–5.1)
Sodium: 133 mmol/L — ABNORMAL LOW (ref 135–145)
Total Bilirubin: 0.4 mg/dL (ref 0.3–1.2)
Total Protein: 4.4 g/dL — ABNORMAL LOW (ref 6.5–8.1)

## 2020-01-14 LAB — SURGICAL PATHOLOGY

## 2020-01-14 NOTE — Progress Notes (Signed)
[facility]  ROUNDING NOTE   Subjective:   Much improved. UOP 1978mL  Edema has improved. Patient states the IV furosemide makes her stomach burn.   Objective:  Vital signs in last 24 hours:  Temp:  [97.8 F (36.6 C)-98.8 F (37.1 C)] 98.8 F (37.1 C) (02/02 0913) Pulse Rate:  [67-76] 76 (02/02 0913) Resp:  [16-20] 20 (02/02 0409) BP: (144-157)/(80-96) 154/96 (02/02 0913) SpO2:  [96 %-100 %] 97 % (02/02 0913) Weight:  [83.6 kg-83.9 kg] 83.9 kg (02/02 0800)  Weight change: -2.268 kg Filed Weights   01/12/20 2245 01/14/20 0409 01/14/20 0800  Weight: 85.8 kg 83.6 kg 83.9 kg    Intake/Output: I/O last 3 completed shifts: In: 360 [P.O.:360] Out: 3075 [Urine:3075]   Intake/Output this shift:  No intake/output data recorded.  Physical Exam: General: NAD,   Head: Normocephalic, atraumatic. Moist oral mucosal membranes  Eyes: Anicteric, PERRL  Neck: Supple, trachea midline  Lungs:  Clear to auscultation  Heart: Regular rate and rhythm  Abdomen:  Soft, nontender, +abdominal wall edema  Extremities: + peripheral edema.  Neurologic: Nonfocal, moving all four extremities  Skin: No lesions        Basic Metabolic Panel: Recent Labs  Lab 01/10/20 1325 01/10/20 1325 01/11/20 0455 01/11/20 0455 01/12/20 0517 01/13/20 0527 01/14/20 0525  NA 130*  --  131*  --  132* 130* 133*  K 4.7  --  4.7  --  5.0 5.0 5.1  CL 101  --  104  --  104 102 104  CO2 21*  --  24  --  22 22 25   GLUCOSE 116*  --  93  --  96 91 99  BUN 61*  --  63*  --  65* 70* 66*  CREATININE 3.68*  --  3.81*  --  2.80* 2.11* 1.79*  CALCIUM 7.9*   < > 7.6*   < > 7.7* 7.6* 7.9*  MG  --   --  2.4  --  2.7*  --   --    < > = values in this interval not displayed.    Liver Function Tests: Recent Labs  Lab 01/10/20 1325 01/11/20 0455 01/12/20 0517 01/13/20 0527 01/14/20 0525  AST 19 13* 14* 13* 11*  ALT 18 13 12 13 11   ALKPHOS 61 41 52 51 44  BILITOT 0.6 0.4 0.3 0.4 0.4  PROT 5.5*  4.6* 4.5* 4.2* 4.4*  ALBUMIN 2.0* 1.7* 1.6* 1.5* 2.0*   Recent Labs  Lab 01/10/20 1325  LIPASE 107*   No results for input(s): AMMONIA in the last 168 hours.  CBC: Recent Labs  Lab 01/10/20 1325 01/11/20 0455 01/14/20 0525  WBC 25.1* 10.9* 12.0*  HGB 14.3 12.2 12.0  HCT 40.9 35.5* 34.4*  MCV 89.7 89.9 89.1  PLT 313 280 324    Cardiac Enzymes: No results for input(s): CKTOTAL, CKMB, CKMBINDEX, TROPONINI in the last 168 hours.  BNP: Invalid input(s): POCBNP  CBG: No results for input(s): GLUCAP in the last 168 hours.  Microbiology: Results for orders placed or performed during the hospital encounter of 01/10/20  Respiratory Panel by RT PCR (Flu A&B, Covid) - Nasopharyngeal Swab     Status: None   Collection Time: 01/10/20  5:34 PM   Specimen: Nasopharyngeal Swab  Result Value Ref Range Status   SARS Coronavirus 2 by RT PCR NEGATIVE NEGATIVE Final    Comment: (NOTE) SARS-CoV-2 target nucleic acids are NOT DETECTED. The SARS-CoV-2 RNA is generally detectable in  upper respiratoy specimens during the acute phase of infection. The lowest concentration of SARS-CoV-2 viral copies this assay can detect is 131 copies/mL. A negative result does not preclude SARS-Cov-2 infection and should not be used as the sole basis for treatment or other patient management decisions. A negative result may occur with  improper specimen collection/handling, submission of specimen other than nasopharyngeal swab, presence of viral mutation(s) within the areas targeted by this assay, and inadequate number of viral copies (<131 copies/mL). A negative result must be combined with clinical observations, patient history, and epidemiological information. The expected result is Negative. Fact Sheet for Patients:  PinkCheek.be Fact Sheet for Healthcare Providers:  GravelBags.it This test is not yet ap proved or cleared by the [place]  FDA and  has been authorized for detection and/or diagnosis of SARS-CoV-2 by FDA under an Emergency Use Authorization (EUA). This EUA will remain  in effect (meaning this test can be used) for the duration of the COVID-19 declaration under Section 564(b)(1) of the Act, 21 U.S.C. section 360bbb-3(b)(1), unless the authorization is terminated or revoked sooner.    Influenza A by PCR NEGATIVE NEGATIVE Final   Influenza B by PCR NEGATIVE NEGATIVE Final    Comment: (NOTE) The Xpert Xpress SARS-CoV-2/FLU/RSV assay is intended as an aid in  the diagnosis of influenza from Nasopharyngeal swab specimens and  should not be used as a sole basis for treatment. Nasal washings and  aspirates are unacceptable for Xpert Xpress SARS-CoV-2/FLU/RSV  testing. Fact Sheet for Patients: PinkCheek.be Fact Sheet for Healthcare Providers: GravelBags.it This test is not yet approved or cleared by the [place] FDA and  has been authorized for detection and/or diagnosis of SARS-CoV-2 by  FDA under an Emergency Use Authorization (EUA). This EUA will remain  in effect (meaning this test can be used) for the duration of the  Covid-19 declaration under Section 564(b)(1) of the Act, 21  U.S.C. section 360bbb-3(b)(1), unless the authorization is  terminated or revoked. Performed at [facility], [address] [place], [place] [address]     Coagulation Studies: No results for input(s): LABPROT, INR in the last 72 hours.  Urinalysis: No results for input(s): COLORURINE, LABSPEC, PHURINE, GLUCOSEU, HGBUR, BILIRUBINUR, KETONESUR, PROTEINUR, UROBILINOGEN, NITRITE, LEUKOCYTESUR in the last 72 hours.  Invalid input(s): APPERANCEUR    Imaging: No results found.   Medications:   . albumin human 25 g (01/14/20 0547)   . furosemide  80 mg Intravenous BID  . heparin  5,000 Units Subcutaneous Q12H  . methylPREDNISolone (SOLU-MEDROL)  injection  60 mg Intravenous Daily  . multivitamin with minerals  1 tablet Oral Daily  . pantoprazole (PROTONIX) IV  40 mg Intravenous Q24H  . sulfamethoxazole-trimethoprim  1 tablet Oral Once per day on Mon Wed Fri   ALPRAZolam, alum & mag hydroxide-simeth, bisacodyl, calcium carbonate, hydrALAZINE, labetalol, ondansetron **OR** ondansetron (ZOFRAN) IV, phenol, polyethylene glycol, promethazine, traMADol, traZODone  Assessment/ Plan:  Ms. Christine Rice is a 39 y.o. white female with IBS, ADHD, depression with new onset nephrotic syndrome. 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  1. Acute renal failure 2. Anasarca/hypoalbuminemia 3. Nephrotic range proteinuria 4. Hematuria  5. Hypertension 6. Headache 7. Nausea/vomiting   Suspect headache, nausea and vomiting are secondary to hypertensive urgency. Have improved with blood pressure control   Plan  - IV furosemide 80mg  bid.  - Stress dose steroids: solumedrol 60mg  daily since 1/29. Transition to PO 60mg  daily on discharge - IV albumin - TMP/SMX for  infection prophylaxis.    LOS: 4 Brodric Schauer 2/2/20219:42 AM

## 2020-01-14 NOTE — Progress Notes (Signed)
PROGRESS NOTE    Christine Rice  [id number] DOB: [birth year] DOA: 01/10/2020  PCP: Abner Greenspan, MD    LOS - 4   Brief Narrative:  [name] a 39 y.o.femalewith medical history significant ofrecently diagnosed minimal change disease (biopsied last weekand just started steroid), anxiety who presented to the ED1/29with intractable vomiting and severe headache.Recent admission for samefrom 1/22 to 1/26. BP also uncontrolled on presentation, as patient unable to take her PRN oral clonidine. In the ED, Patient admitted with nephrology consulted. hypertensive 150/104 ->165/113 otherwise stable vitals. Labs notable for Na 130, CO2 21, BUN 61, Cr 3.68 (from 2.35 yesterday and 1.57 on 1/25), lipase 107, albumin 2.0, WBC 25k. Flu A/B and Covid-19 negative. Nephrology was consulted by ED physician, recommended admission for IV steroids, BP control, and gentle fluids for AKI.  Subjective 2/2: Patient seen at bedside this morning.  No acute events reported.  States she feels good today.  Has not had headaches unless she gets her self worked up or upset with stress.  Has not had nausea or vomiting.  Eating a little bit.  Swelling improves.  Reports good urine output with double dose of Lasix yesterday along with albumin.  She does report IV Lasix makes her stomach burn, but it does go away.  Assessment & Plan:   Principal Problem:   Acute kidney injury ([place]) Active Problems:   Intractable vomiting with nausea   Hypertensive urgency   Headache   Hyponatremia   Nephrotic syndrome, minimal change   Leukocytosis   Hypoalbuminemia   Anxiety disorder   Acute Kidney Injury-Present on admission, superimposed on nephrotic syndrome.  Most likely pre-renal given vomiting and secondary dehydration. Minimal Change Disease / Nephrotic Syndrome --Nephrology following --Lasix per nephro --IV albumin 25 g every 8 hours --IV steroids per nephro --IV PPI for GI protection -  increased to BID given ongoing heartburn not relieved with tums or maalox --Bactrim for infection prevention while on steroids --daily BMP's   Hypertensive Urgency - present on admission, improved. Had been discharged with PRN oral clonidine for uncontrolled BP, but unable to tolerate PO intake.  Suspect this may be causing her headache.Use of clonidine also has issue of rebound hypertension. --Hydralazine and/or Labetalol IV PRN --may need to be started on scheduled med, will monitor  Heartburn-new onset evening of 1/30 --As needed Mylanta ordered --Is on IV PPI for GI prophylaxis  Intractable nausea/vomiting- resolved Unclear cause at this point,possibly uremia or hypertensive urgency. ?New medications but doubtful as onset of N/V was before starting Prednisone, and she has tolerated Demadex without issues before N/V started. Lipase mildly elevated and with epigastric tenderness, could have mild case of acute pancreatitis. --IV Zofran and/or Phenergan PRN --Diet as tolerated  Headache - resolved.  Suspect due to uncontrolled hypertension. Improved once BP better controlled. --acetaminophen IV PRN --cool compresses PRN, room dark, quiet environment --Neurology consulted and also attributes her headaches to hypertensive urgency. Recommend considering magnesium and B12 supplements. Will discuss with patient.  Hyponatremia, mild, Na 130 on admission.Improved.  Given anasarca due to MCD, suspect hypervolemic.Expect improvement diuretics.   --daily BMP's  Non-anion gap metabolic acidosis -resolved. Was most likely due to N/V --monitor   Hypoalbuminemia- secondary to MCD  Leukocytosis -Improved.WBC 25k on admission, no evidence of infection.  Suspect reactive in setting in N/V in addition to steroids. --Monitor CBC --Monitor for s/sx's of infection  Anxiety Disorder - continue home Xanax PRN   DVT prophylaxis:heparin Code Status: Full  Code Family Communication:none at bedside during encounter Disposition Plan:Expect d/c home pending improvement and cleared by nephrology.Expect to be discharged home without any anticipated barriers at this time.   Consultants:  Nephrology  Procedures:  none  Antimicrobials:  Bactrim for prophylaxis while on steroids   Objective: Vitals:   01/13/20 1204 01/13/20 1310 01/13/20 2053 01/14/20 0409  BP: (!) 157/91 (!) 150/89 (!) 155/93 (!) 145/96  Pulse: 67  74 72  Resp: 16  20 20   Temp: 97.8 F (36.6 C)  98.4 F (36.9 C) 98.4 F (36.9 C)  TempSrc: Oral  Oral Oral  SpO2: 96%  100% 99%  Weight:    83.6 kg  Height:        Intake/Output Summary (Last 24 hours) at 01/14/2020 0753 Last data filed at 01/14/2020 0525 Gross per 24 hour  Intake 360 ml  Output 1975 ml  Net -1615 ml   Filed Weights   01/10/20 1956 01/12/20 2245 01/14/20 0409  Weight: 85.1 kg 85.8 kg 83.6 kg    Examination:  General exam: awake, alert, no acute distress, pleasant conversational HEENT: moist mucus membranes, hearing grossly normal  Respiratory system: clear to auscultation bilaterally, no wheezes, rales or rhonchi, normal respiratory effort. Cardiovascular system: normal S1/S2, RRR, 1+ pitting edema of bilateral lower extremities.   Central nervous system: alert and oriented x4. no gross focal neurologic deficits, normal speech Extremities: moves all, no cyanosis, normal tone Skin: dry, intact, normal temperature, normal color Psychiatry: normal mood, congruent affect, judgement and insight appear normal    Data Reviewed: I have personally reviewed following labs and imaging studies  CBC: Recent Labs  Lab 01/10/20 1325 01/11/20 0455 01/14/20 0525  WBC 25.1* 10.9* 12.0*  HGB 14.3 12.2 12.0  HCT 40.9 35.5* 34.4*  MCV 89.7 89.9 89.1  PLT 313 280 0000000   Basic Metabolic Panel: Recent Labs  Lab 01/10/20 1325 01/11/20 0455 01/12/20 0517 01/13/20 0527 01/14/20 0525   NA 130* 131* 132* 130* 133*  K 4.7 4.7 5.0 5.0 5.1  CL 101 104 104 102 104  CO2 21* 24 22 22 25   GLUCOSE 116* 93 96 91 99  BUN 61* 63* 65* 70* 66*  CREATININE 3.68* 3.81* 2.80* 2.11* 1.79*  CALCIUM 7.9* 7.6* 7.7* 7.6* 7.9*  MG  --  2.4 2.7*  --   --    GFR: Estimated Creatinine Clearance: 46.4 mL/min (A) (by C-G formula based on SCr of 1.79 mg/dL (H)). Liver Function Tests: Recent Labs  Lab 01/10/20 1325 01/11/20 0455 01/12/20 0517 01/13/20 0527 01/14/20 0525  AST 19 13* 14* 13* 11*  ALT 18 13 12 13 11   ALKPHOS 61 41 52 51 44  BILITOT 0.6 0.4 0.3 0.4 0.4  PROT 5.5* 4.6* 4.5* 4.2* 4.4*  ALBUMIN 2.0* 1.7* 1.6* 1.5* 2.0*   Recent Labs  Lab 01/10/20 1325  LIPASE 107*   No results for input(s): AMMONIA in the last 168 hours. Coagulation Profile: No results for input(s): INR, PROTIME in the last 168 hours. Cardiac Enzymes: No results for input(s): CKTOTAL, CKMB, CKMBINDEX, TROPONINI in the last 168 hours. BNP (last 3 results) No results for input(s): PROBNP in the last 8760 hours. HbA1C: No results for input(s): HGBA1C in the last 72 hours. CBG: No results for input(s): GLUCAP in the last 168 hours. Lipid Profile: No results for input(s): CHOL, HDL, LDLCALC, TRIG, CHOLHDL, LDLDIRECT in the last 72 hours. Thyroid Function Tests: No results for input(s): TSH, T4TOTAL, FREET4, T3FREE, THYROIDAB in the last  72 hours. Anemia Panel: No results for input(s): VITAMINB12, FOLATE, FERRITIN, TIBC, IRON, RETICCTPCT in the last 72 hours. Sepsis Labs: No results for input(s): PROCALCITON, LATICACIDVEN in the last 168 hours.  Recent Results (from the past 240 hour(s))  Respiratory Panel by RT PCR (Flu A&B, Covid) - Nasopharyngeal Swab     Status: None   Collection Time: 01/10/20  5:34 PM   Specimen: Nasopharyngeal Swab  Result Value Ref Range Status   SARS Coronavirus 2 by RT PCR NEGATIVE NEGATIVE Final    Comment: (NOTE) SARS-CoV-2 target nucleic acids are NOT DETECTED. The  SARS-CoV-2 RNA is generally detectable in upper respiratoy specimens during the acute phase of infection. The lowest concentration of SARS-CoV-2 viral copies this assay can detect is 131 copies/mL. A negative result does not preclude SARS-Cov-2 infection and should not be used as the sole basis for treatment or other patient management decisions. A negative result may occur with  improper specimen collection/handling, submission of specimen other than nasopharyngeal swab, presence of viral mutation(s) within the areas targeted by this assay, and inadequate number of viral copies (<131 copies/mL). A negative result must be combined with clinical observations, patient history, and epidemiological information. The expected result is Negative. Fact Sheet for Patients:  PinkCheek.be Fact Sheet for Healthcare Providers:  GravelBags.it This test is not yet ap proved or cleared by the [place] FDA and  has been authorized for detection and/or diagnosis of SARS-CoV-2 by FDA under an Emergency Use Authorization (EUA). This EUA will remain  in effect (meaning this test can be used) for the duration of the COVID-19 declaration under Section 564(b)(1) of the Act, 21 U.S.C. section 360bbb-3(b)(1), unless the authorization is terminated or revoked sooner.    Influenza A by PCR NEGATIVE NEGATIVE Final   Influenza B by PCR NEGATIVE NEGATIVE Final    Comment: (NOTE) The Xpert Xpress SARS-CoV-2/FLU/RSV assay is intended as an aid in  the diagnosis of influenza from Nasopharyngeal swab specimens and  should not be used as a sole basis for treatment. Nasal washings and  aspirates are unacceptable for Xpert Xpress SARS-CoV-2/FLU/RSV  testing. Fact Sheet for Patients: PinkCheek.be Fact Sheet for Healthcare Providers: GravelBags.it This test is not yet approved or cleared by the [place] FDA and  has been authorized for detection and/or diagnosis of SARS-CoV-2 by  FDA under an Emergency Use Authorization (EUA). This EUA will remain  in effect (meaning this test can be used) for the duration of the  Covid-19 declaration under Section 564(b)(1) of the Act, 21  U.S.C. section 360bbb-3(b)(1), unless the authorization is  terminated or revoked. Performed at [facility], [address] [place], [place] [address]          Radiology Studies: No results found.      Scheduled Meds: . furosemide  80 mg Intravenous BID  . heparin  5,000 Units Subcutaneous Q12H  . methylPREDNISolone (SOLU-MEDROL) injection  60 mg Intravenous Daily  . multivitamin with minerals  1 tablet Oral Daily  . pantoprazole (PROTONIX) IV  40 mg Intravenous Q24H  . sulfamethoxazole-trimethoprim  1 tablet Oral Once per day on Mon Wed Fri   Continuous Infusions: . albumin human 25 g (01/14/20 0547)     LOS: 4 days    Time spent: 30 minutes    Ezekiel Slocumb, DO [facility]   If 7PM-7AM, please contact night-coverage [email or web address] 01/14/2020, 7:53 AM

## 2020-01-15 LAB — MAGNESIUM: Magnesium: 2.7 mg/dL — ABNORMAL HIGH (ref 1.7–2.4)

## 2020-01-15 LAB — COMPREHENSIVE METABOLIC PANEL
ALT: 9 U/L (ref 0–44)
AST: 10 U/L — ABNORMAL LOW (ref 15–41)
Albumin: 2.5 g/dL — ABNORMAL LOW (ref 3.5–5.0)
Alkaline Phosphatase: 37 U/L — ABNORMAL LOW (ref 38–126)
Anion gap: 10 (ref 5–15)
BUN: 58 mg/dL — ABNORMAL HIGH (ref 6–20)
CO2: 24 mmol/L (ref 22–32)
Calcium: 8.2 mg/dL — ABNORMAL LOW (ref 8.9–10.3)
Chloride: 105 mmol/L (ref 98–111)
Creatinine, Ser: 1.47 mg/dL — ABNORMAL HIGH (ref 0.44–1.00)
GFR calc Af Amer: 52 mL/min — ABNORMAL LOW (ref >60)
GFR calc non Af Amer: 45 mL/min — ABNORMAL LOW (ref >60)
Glucose, Bld: 79 mg/dL (ref 70–99)
Potassium: 4.6 mmol/L (ref 3.5–5.1)
Sodium: 139 mmol/L (ref 135–145)
Total Bilirubin: 0.5 mg/dL (ref 0.3–1.2)
Total Protein: 4.5 g/dL — ABNORMAL LOW (ref 6.5–8.1)

## 2020-01-15 LAB — CBC
HCT: 32.4 % — ABNORMAL LOW (ref 36.0–46.0)
Hemoglobin: 11 g/dL — ABNORMAL LOW (ref 12.0–15.0)
MCH: 30.8 pg (ref 26.0–34.0)
MCHC: 34 g/dL (ref 30.0–36.0)
MCV: 90.8 fL (ref 80.0–100.0)
Platelets: 319 10*3/uL (ref 150–400)
RBC: 3.57 MIL/uL — ABNORMAL LOW (ref 3.87–5.11)
RDW: 12 % (ref 11.5–15.5)
WBC: 11.2 10*3/uL — ABNORMAL HIGH (ref 4.0–10.5)
nRBC: 0 % (ref 0.0–0.2)

## 2020-01-15 MED ORDER — FUROSEMIDE 40 MG PO TABS
40.0000 mg | ORAL_TABLET | Freq: Two times a day (BID) | ORAL | Status: DC
  Administered 2020-01-15 – 2020-01-16 (×2): 40 mg via ORAL
  Filled 2020-01-15 (×2): qty 1

## 2020-01-15 MED ORDER — PREDNISONE 20 MG PO TABS
60.0000 mg | ORAL_TABLET | Freq: Every day | ORAL | Status: DC
  Administered 2020-01-16: 60 mg via ORAL
  Filled 2020-01-15: qty 3

## 2020-01-15 MED ORDER — PANTOPRAZOLE SODIUM 40 MG PO TBEC
40.0000 mg | DELAYED_RELEASE_TABLET | Freq: Every day | ORAL | Status: DC
  Administered 2020-01-16: 40 mg via ORAL
  Filled 2020-01-15: qty 1

## 2020-01-15 NOTE — Progress Notes (Signed)
[facility]  ROUNDING NOTE   Subjective:   Much improved. UOP recorded at 1516mL.   Walking around   Objective:  Vital signs in last 24 hours:  Temp:  [98.1 F (36.7 C)-98.7 F (37.1 C)] 98.7 F (37.1 C) (02/03 1245) Pulse Rate:  [68-78] 78 (02/03 1245) Resp:  [20] 20 (02/03 1245) BP: (137-167)/(83-92) 160/92 (02/03 1245) SpO2:  [98 %-100 %] 98 % (02/03 1245) Weight:  [85 kg] 85 kg (02/03 0428)  Weight change: 0.318 kg Filed Weights   01/14/20 0409 01/14/20 0800 01/15/20 0428  Weight: 83.6 kg 83.9 kg 85 kg    Intake/Output: I/O last 3 completed shifts: In: 362.2 [IV Piggyback:362.2] Out: 2300 [Urine:2300]   Intake/Output this shift:  Total I/O In: 440 [P.O.:240; IV Piggyback:200] Out: -   Physical Exam: General: NAD,   Head: Normocephalic, atraumatic. Moist oral mucosal membranes  Eyes: Anicteric, PERRL  Neck: Supple, trachea midline  Lungs:  Clear to auscultation  Heart: Regular rate and rhythm  Abdomen:  Soft, nontender  Extremities: + peripheral edema.  Neurologic: Nonfocal, moving all four extremities  Skin: No lesions        Basic Metabolic Panel: Recent Labs  Lab 01/11/20 0455 01/11/20 0455 01/12/20 0517 01/12/20 0517 01/13/20 0527 01/14/20 0525 01/15/20 0656  NA 131*  --  132*  --  130* 133* 139  K 4.7  --  5.0  --  5.0 5.1 4.6  CL 104  --  104  --  102 104 105  CO2 24  --  22  --  22 25 24   GLUCOSE 93  --  96  --  91 99 79  BUN 63*  --  65*  --  70* 66* 58*  CREATININE 3.81*  --  2.80*  --  2.11* 1.79* 1.47*  CALCIUM 7.6*   < > 7.7*   < > 7.6* 7.9* 8.2*  MG 2.4  --  2.7*  --   --   --  2.7*   < > = values in this interval not displayed.    Liver Function Tests: Recent Labs  Lab 01/11/20 0455 01/12/20 0517 01/13/20 0527 01/14/20 0525 01/15/20 0656  AST 13* 14* 13* 11* 10*  ALT 13 12 13 11 9   ALKPHOS 41 52 51 44 37*  BILITOT 0.4 0.3 0.4 0.4 0.5  PROT 4.6* 4.5* 4.2* 4.4* 4.5*  ALBUMIN 1.7* 1.6* 1.5* 2.0* 2.5*    Recent Labs  Lab 01/10/20 1325  LIPASE 107*   No results for input(s): AMMONIA in the last 168 hours.  CBC: Recent Labs  Lab 01/10/20 1325 01/11/20 0455 01/14/20 0525 01/15/20 0656  WBC 25.1* 10.9* 12.0* 11.2*  HGB 14.3 12.2 12.0 11.0*  HCT 40.9 35.5* 34.4* 32.4*  MCV 89.7 89.9 89.1 90.8  PLT 313 280 324 319    Cardiac Enzymes: No results for input(s): CKTOTAL, CKMB, CKMBINDEX, TROPONINI in the last 168 hours.  BNP: Invalid input(s): POCBNP  CBG: No results for input(s): GLUCAP in the last 168 hours.  Microbiology: Results for orders placed or performed during the hospital encounter of 01/10/20  Respiratory Panel by RT PCR (Flu A&B, Covid) - Nasopharyngeal Swab     Status: None   Collection Time: 01/10/20  5:34 PM   Specimen: Nasopharyngeal Swab  Result Value Ref Range Status   SARS Coronavirus 2 by RT PCR NEGATIVE NEGATIVE Final    Comment: (NOTE) SARS-CoV-2 target nucleic acids are NOT DETECTED. The SARS-CoV-2 RNA is generally detectable  in upper respiratoy specimens during the acute phase of infection. The lowest concentration of SARS-CoV-2 viral copies this assay can detect is 131 copies/mL. A negative result does not preclude SARS-Cov-2 infection and should not be used as the sole basis for treatment or other patient management decisions. A negative result may occur with  improper specimen collection/handling, submission of specimen other than nasopharyngeal swab, presence of viral mutation(s) within the areas targeted by this assay, and inadequate number of viral copies (<131 copies/mL). A negative result must be combined with clinical observations, patient history, and epidemiological information. The expected result is Negative. Fact Sheet for Patients:  PinkCheek.be Fact Sheet for Healthcare Providers:  GravelBags.it This test is not yet ap proved or cleared by the [place] FDA and  has  been authorized for detection and/or diagnosis of SARS-CoV-2 by FDA under an Emergency Use Authorization (EUA). This EUA will remain  in effect (meaning this test can be used) for the duration of the COVID-19 declaration under Section 564(b)(1) of the Act, 21 U.S.C. section 360bbb-3(b)(1), unless the authorization is terminated or revoked sooner.    Influenza A by PCR NEGATIVE NEGATIVE Final   Influenza B by PCR NEGATIVE NEGATIVE Final    Comment: (NOTE) The Xpert Xpress SARS-CoV-2/FLU/RSV assay is intended as an aid in  the diagnosis of influenza from Nasopharyngeal swab specimens and  should not be used as a sole basis for treatment. Nasal washings and  aspirates are unacceptable for Xpert Xpress SARS-CoV-2/FLU/RSV  testing. Fact Sheet for Patients: PinkCheek.be Fact Sheet for Healthcare Providers: GravelBags.it This test is not yet approved or cleared by the [place] FDA and  has been authorized for detection and/or diagnosis of SARS-CoV-2 by  FDA under an Emergency Use Authorization (EUA). This EUA will remain  in effect (meaning this test can be used) for the duration of the  Covid-19 declaration under Section 564(b)(1) of the Act, 21  U.S.C. section 360bbb-3(b)(1), unless the authorization is  terminated or revoked. Performed at [facility], [address] [place], [place] [address]     Coagulation Studies: No results for input(s): LABPROT, INR in the last 72 hours.  Urinalysis: No results for input(s): COLORURINE, LABSPEC, PHURINE, GLUCOSEU, HGBUR, BILIRUBINUR, KETONESUR, PROTEINUR, UROBILINOGEN, NITRITE, LEUKOCYTESUR in the last 72 hours.  Invalid input(s): APPERANCEUR    Imaging: No results found.   Medications:   . albumin human Stopped (01/15/20 0756)   . furosemide  40 mg Oral BID  . heparin  5,000 Units Subcutaneous Q12H  . multivitamin with minerals  1 tablet Oral Daily  . [START  ON 01/16/2020] pantoprazole  40 mg Oral Daily  . [START ON 01/16/2020] predniSONE  60 mg Oral Q breakfast  . sulfamethoxazole-trimethoprim  1 tablet Oral Once per day on Mon Wed Fri   ALPRAZolam, alum & mag hydroxide-simeth, bisacodyl, calcium carbonate, hydrALAZINE, labetalol, ondansetron **OR** ondansetron (ZOFRAN) IV, phenol, polyethylene glycol, promethazine, traMADol, traZODone  Assessment/ Plan:  Christine Rice is a 39 y.o. white female with IBS, ADHD, depression with new onset nephrotic syndrome. 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  1. Acute renal failure 2. Anasarca/hypoalbuminemia 3. Nephrotic range proteinuria 4. Hematuria  5. Hypertension 6. Headache 7. Nausea/vomiting   Suspect headache, nausea and vomiting are secondary to hypertensive urgency. Have improved with blood pressure control   Plan  - IV furosemide 80mg  bid. Transition to PO furosemide 40mg  bid.  - Stress dose steroids: solumedrol 60mg  daily since 1/29. Transition to PO 60mg   daily   - IV albumin - TMP/SMX for infection prophylaxis.    LOS: [place] 2/3/20211:54 PM

## 2020-01-15 NOTE — Progress Notes (Signed)
PROGRESS NOTE    Christine Rice  [id number] DOB: 1981/03/01 DOA: 01/10/2020  PCP: Abner Greenspan, MD    LOS - 5   Brief Narrative:  Christine Rice a 39 y.o.femalewith medical history significant ofrecently diagnosed minimal change disease (biopsied last weekand just started steroid), anxiety who presented to the ED1/29with intractable vomiting and severe headache.Recent admission for samefrom 1/22 to 1/26. BP also uncontrolled on presentation, as patient unable to take her PRN oral clonidine. In the ED, Patient admitted with nephrology consulted. hypertensive 150/104 ->165/113 otherwise stable vitals. Labs notable for Na 130, CO2 21, BUN 61, Cr 3.68 (from 2.35 yesterday and 1.57 on 1/25), lipase 107, albumin 2.0, WBC 25k. Flu A/B and Covid-19 negative. Nephrology was consulted by ED physician, recommended admission for IV steroids, BP control, and gentle fluids for AKI.  Subjective: Patient seen at bedside this morning.  No acute events reported.  Patient is feeling improvement, still has some swelling in the bilateral lower extremities.   Assessment & Plan:   Principal Problem:   Acute kidney injury ([place]) Active Problems:   Anxiety disorder   Intractable vomiting with nausea   Hypertensive urgency   Headache   Hyponatremia   Nephrotic syndrome, minimal change   Leukocytosis   Hypoalbuminemia   Minimal change disease   Acute Kidney Injury-Present on admission, superimposed on nephrotic syndrome.  Most likely pre-renal given vomiting and secondary dehydration. Minimal Change Disease / Nephrotic Syndrome --Nephrology following --Lasix per nephro --IV albumin 25 g every 8 hours --IV steroids per nephro --IV PPI for GI protection - increased to BID given ongoing heartburn not relieved with tums or maalox --Bactrim for infection prevention while on steroids --daily BMP's   Hypertensive Urgency - present on admission, improved. Had been discharged  with PRN oral clonidine for uncontrolled BP, but unable to tolerate PO intake.  Suspect this may be causing her headache.Use of clonidine also has issue of rebound hypertension. --Hydralazine and/or Labetalol IV PRN --may need to be started on scheduled med, will monitor  Heartburn-new onset evening of 1/30 --As needed Mylanta ordered --Is on IV PPI for GI prophylaxis  Intractable nausea/vomiting- resolved Unclear cause at this point,possibly uremia or hypertensive urgency. ?New medications but doubtful as onset of N/V was before starting Prednisone, and she has tolerated Demadex without issues before N/V started. Lipase mildly elevated and with epigastric tenderness, could have mild case of acute pancreatitis. --IV Zofran and/or Phenergan PRN --Diet as tolerated  Headache - resolved.  Suspect due to uncontrolled hypertension. Improved once BP better controlled. --acetaminophen IV PRN --cool compresses PRN, room dark, quiet environment --Neurology consulted and also attributes her headaches to hypertensive urgency. Recommend considering magnesium and B12 supplements. Will discuss with patient.  Hyponatremia, mild, Na 130 on admission.Improved.  Given anasarca due to MCD, suspect hypervolemic.Expect improvement diuretics.   --daily BMP's  Non-anion gap metabolic acidosis -resolved. Was most likely due to N/V --monitor   Hypoalbuminemia- secondary to MCD  Leukocytosis -Improved.WBC 25k on admission, no evidence of infection.  Suspect reactive in setting in N/V in addition to steroids. --Monitor CBC --Monitor for s/sx's of infection  Anxiety Disorder - continue home Xanax PRN   DVT prophylaxis:heparin Code Status: Full Code Family Communication:none at bedside during encounter Disposition Plan:Expect d/c home pending improvement and cleared by nephrology.Expect to be discharged home without any anticipated barriers at this  time.   Consultants:  Nephrology  Procedures:  none  Antimicrobials:  Bactrim for prophylaxis while on steroids  Objective: Vitals:   01/14/20 2048 01/15/20 0428 01/15/20 0800 01/15/20 1245  BP: 140/83 137/83 (!) 167/91 (!) 160/92  Pulse: 68 71 74 78  Resp: 20 20 20 20   Temp: 98.1 F (36.7 C) 98.1 F (36.7 C) 98.4 F (36.9 C) 98.7 F (37.1 C)  TempSrc: Oral Oral Oral Oral  SpO2: 98% 98% 100% 98%  Weight:  85 kg    Height:        Intake/Output Summary (Last 24 hours) at 01/15/2020 1712 Last data filed at 01/15/2020 1020 Gross per 24 hour  Intake 540 ml  Output --  Net 540 ml   Filed Weights   01/14/20 0409 01/14/20 0800 01/15/20 0428  Weight: 83.6 kg 83.9 kg 85 kg    Examination:  General exam: awake, alert, no acute distress, pleasant conversational HEENT: moist mucus membranes, hearing grossly normal  Respiratory system: clear to auscultation bilaterally, no wheezes, rales or rhonchi, normal respiratory effort. Cardiovascular system: normal S1/S2, RRR, 1+ pitting edema of bilateral lower extremities.   Central nervous system: alert and oriented x4. no gross focal neurologic deficits, normal speech Extremities: moves all, no cyanosis, normal tone Skin: dry, intact, normal temperature, normal color Psychiatry: normal mood, congruent affect, judgement and insight appear normal    Data Reviewed: I have personally reviewed following labs and imaging studies  CBC: Recent Labs  Lab 01/10/20 1325 01/11/20 0455 01/14/20 0525 01/15/20 0656  WBC 25.1* 10.9* 12.0* 11.2*  HGB 14.3 12.2 12.0 11.0*  HCT 40.9 35.5* 34.4* 32.4*  MCV 89.7 89.9 89.1 90.8  PLT 313 280 324 [id number]   Basic Metabolic Panel: Recent Labs  Lab 01/11/20 0455 01/12/20 0517 01/13/20 0527 01/14/20 0525 01/15/20 0656  NA 131* 132* 130* 133* 139  K 4.7 5.0 5.0 5.1 4.6  CL 104 104 102 104 105  CO2 24 22 22 25 24   GLUCOSE 93 96 91 99 79  BUN 63* 65* 70* 66* 58*  CREATININE 3.81*  2.80* 2.11* 1.79* 1.47*  CALCIUM 7.6* 7.7* 7.6* 7.9* 8.2*  MG 2.4 2.7*  --   --  2.7*   GFR: Estimated Creatinine Clearance: 57 mL/min (A) (by C-G formula based on SCr of 1.47 mg/dL (H)). Liver Function Tests: Recent Labs  Lab 01/11/20 0455 01/12/20 0517 01/13/20 0527 01/14/20 0525 01/15/20 0656  AST 13* 14* 13* 11* 10*  ALT 13 12 13 11 9   ALKPHOS 41 52 51 44 37*  BILITOT 0.4 0.3 0.4 0.4 0.5  PROT 4.6* 4.5* 4.2* 4.4* 4.5*  ALBUMIN 1.7* 1.6* 1.5* 2.0* 2.5*   Recent Labs  Lab 01/10/20 1325  LIPASE 107*   No results for input(s): AMMONIA in the last 168 hours. Coagulation Profile: No results for input(s): INR, PROTIME in the last 168 hours. Cardiac Enzymes: No results for input(s): CKTOTAL, CKMB, CKMBINDEX, TROPONINI in the last 168 hours. BNP (last 3 results) No results for input(s): PROBNP in the last 8760 hours. HbA1C: No results for input(s): HGBA1C in the last 72 hours. CBG: No results for input(s): GLUCAP in the last 168 hours. Lipid Profile: No results for input(s): CHOL, HDL, LDLCALC, TRIG, CHOLHDL, LDLDIRECT in the last 72 hours. Thyroid Function Tests: No results for input(s): TSH, T4TOTAL, FREET4, T3FREE, THYROIDAB in the last 72 hours. Anemia Panel: No results for input(s): VITAMINB12, FOLATE, FERRITIN, TIBC, IRON, RETICCTPCT in the last 72 hours. Sepsis Labs: No results for input(s): PROCALCITON, LATICACIDVEN in the last 168 hours.  Recent Results (from the past 240 hour(s))  Respiratory Panel  by RT PCR (Flu A&B, Covid) - Nasopharyngeal Swab     Status: None   Collection Time: 01/10/20  5:34 PM   Specimen: Nasopharyngeal Swab  Result Value Ref Range Status   SARS Coronavirus 2 by RT PCR NEGATIVE NEGATIVE Final    Comment: (NOTE) SARS-CoV-2 target nucleic acids are NOT DETECTED. The SARS-CoV-2 RNA is generally detectable in upper respiratoy specimens during the acute phase of infection. The lowest concentration of SARS-CoV-2 viral copies this assay can  detect is 131 copies/mL. A negative result does not preclude SARS-Cov-2 infection and should not be used as the sole basis for treatment or other patient management decisions. A negative result may occur with  improper specimen collection/handling, submission of specimen other than nasopharyngeal swab, presence of viral mutation(s) within the areas targeted by this assay, and inadequate number of viral copies (<131 copies/mL). A negative result must be combined with clinical observations, patient history, and epidemiological information. The expected result is Negative. Fact Sheet for Patients:  PinkCheek.be Fact Sheet for Healthcare Providers:  GravelBags.it This test is not yet ap proved or cleared by the [place] FDA and  has been authorized for detection and/or diagnosis of SARS-CoV-2 by FDA under an Emergency Use Authorization (EUA). This EUA will remain  in effect (meaning this test can be used) for the duration of the COVID-19 declaration under Section 564(b)(1) of the Act, 21 U.S.C. section 360bbb-3(b)(1), unless the authorization is terminated or revoked sooner.    Influenza A by PCR NEGATIVE NEGATIVE Final   Influenza B by PCR NEGATIVE NEGATIVE Final    Comment: (NOTE) The Xpert Xpress SARS-CoV-2/FLU/RSV assay is intended as an aid in  the diagnosis of influenza from Nasopharyngeal swab specimens and  should not be used as a sole basis for treatment. Nasal washings and  aspirates are unacceptable for Xpert Xpress SARS-CoV-2/FLU/RSV  testing. Fact Sheet for Patients: PinkCheek.be Fact Sheet for Healthcare Providers: GravelBags.it This test is not yet approved or cleared by the [place] FDA and  has been authorized for detection and/or diagnosis of SARS-CoV-2 by  FDA under an Emergency Use Authorization (EUA). This EUA will remain  in effect (meaning  this test can be used) for the duration of the  Covid-19 declaration under Section 564(b)(1) of the Act, 21  U.S.C. section 360bbb-3(b)(1), unless the authorization is  terminated or revoked. Performed at [facility], [address] [place], [place] [address]          Radiology Studies: No results found.      Scheduled Meds: . furosemide  40 mg Oral BID  . heparin  5,000 Units Subcutaneous Q12H  . multivitamin with minerals  1 tablet Oral Daily  . [START ON 01/16/2020] pantoprazole  40 mg Oral Daily  . [START ON 01/16/2020] predniSONE  60 mg Oral Q breakfast  . sulfamethoxazole-trimethoprim  1 tablet Oral Once per day on Mon Wed Fri   Continuous Infusions: . albumin human 25 g (01/15/20 1450)     LOS: 5 days    Time spent: 30 minutes    Val Riles, MD [facility]   If 7PM-7AM, please contact night-coverage [email or web address] 01/15/2020, 5:12 PM

## 2020-01-15 NOTE — Progress Notes (Signed)
Ch continue to follow up with the patient for spiritual support which is requested at each visit. Patient is scheduled to go hope perhaps Friday if her test continue to improve. Patient is excited about returning to family.

## 2020-01-16 LAB — BASIC METABOLIC PANEL
Anion gap: 4 — ABNORMAL LOW (ref 5–15)
BUN: 43 mg/dL — ABNORMAL HIGH (ref 6–20)
CO2: 28 mmol/L (ref 22–32)
Calcium: 8.3 mg/dL — ABNORMAL LOW (ref 8.9–10.3)
Chloride: 105 mmol/L (ref 98–111)
Creatinine, Ser: 1.13 mg/dL — ABNORMAL HIGH (ref 0.44–1.00)
GFR calc Af Amer: >60 mL/min (ref >60)
GFR calc non Af Amer: >60 mL/min (ref >60)
Glucose, Bld: 77 mg/dL (ref 70–99)
Potassium: 4.5 mmol/L (ref 3.5–5.1)
Sodium: 137 mmol/L (ref 135–145)

## 2020-01-16 LAB — CBC
HCT: 34.5 % — ABNORMAL LOW (ref 36.0–46.0)
Hemoglobin: 11.9 g/dL — ABNORMAL LOW (ref 12.0–15.0)
MCH: 31.1 pg (ref 26.0–34.0)
MCHC: 34.5 g/dL (ref 30.0–36.0)
MCV: 90.1 fL (ref 80.0–100.0)
Platelets: 346 10*3/uL (ref 150–400)
RBC: 3.83 MIL/uL — ABNORMAL LOW (ref 3.87–5.11)
RDW: 12 % (ref 11.5–15.5)
WBC: 15.8 10*3/uL — ABNORMAL HIGH (ref 4.0–10.5)
nRBC: 0 % (ref 0.0–0.2)

## 2020-01-16 LAB — LIPID PANEL
Cholesterol: 349 mg/dL — ABNORMAL HIGH (ref 0–200)
HDL: 49 mg/dL (ref >40)
LDL Cholesterol: 261 mg/dL — ABNORMAL HIGH (ref 0–99)
Total CHOL/HDL Ratio: 7.1 RATIO
Triglycerides: 194 mg/dL — ABNORMAL HIGH (ref <150)
VLDL: 39 mg/dL (ref 0–40)

## 2020-01-16 LAB — PHOSPHORUS: Phosphorus: 3.8 mg/dL (ref 2.5–4.6)

## 2020-01-16 LAB — MAGNESIUM: Magnesium: 2.5 mg/dL — ABNORMAL HIGH (ref 1.7–2.4)

## 2020-01-16 MED ORDER — PANTOPRAZOLE SODIUM 40 MG PO TBEC: 40.0000 mg | DELAYED_RELEASE_TABLET | Freq: Every day | ORAL | 2 refills | Status: DC

## 2020-01-16 MED ORDER — PREDNISONE 20 MG PO TABS: 60.0000 mg | ORAL_TABLET | Freq: Every day | ORAL | 0 refills | Status: AC

## 2020-01-16 MED ORDER — FUROSEMIDE 40 MG PO TABS: 40.0000 mg | ORAL_TABLET | Freq: Two times a day (BID) | ORAL | 0 refills | Status: DC

## 2020-01-16 MED ORDER — LOSARTAN POTASSIUM 50 MG PO TABS: 100.0000 mg | ORAL_TABLET | Freq: Every day | ORAL | Status: DC

## 2020-01-16 MED ORDER — ATORVASTATIN CALCIUM 20 MG PO TABS: 40.0000 mg | ORAL_TABLET | Freq: Every day | ORAL | Status: DC

## 2020-01-16 MED ORDER — ATORVASTATIN CALCIUM 40 MG PO TABS: 40.0000 mg | ORAL_TABLET | Freq: Every day | ORAL | 2 refills | Status: DC

## 2020-01-16 MED ORDER — SULFAMETHOXAZOLE-TRIMETHOPRIM 400-80 MG PO TABS: 1.0000 | ORAL_TABLET | ORAL | 2 refills | Status: DC

## 2020-01-16 MED ORDER — LOSARTAN POTASSIUM 100 MG PO TABS: 100.0000 mg | ORAL_TABLET | Freq: Every day | ORAL | 0 refills | Status: DC

## 2020-01-16 NOTE — TOC Initial Note (Signed)
Transition of Care [facility]) - Initial/Assessment Note    Patient Details  Name: Christine Rice MRN: [id number] Date of Birth: September 03, 1981  Transition of Care [facility]) CM/SW Contact:    Candie Chroman, LCSW Phone Number: 01/16/2020, 12:55 PM  Clinical Narrative:  Readmission prevention screen complete. CSW met with patient. No supports at bedside. CSW introduced role and explained that discharge planning would be discussed. Patient is independent from home with her children. She does not have insurance and is agreeable to her prescriptions being filled at [facility]. CSW working with MD regarding this. Patient drives. She said financial counseling called about a Medicaid application to assist with her hospital bills. No further concerns. She does have a ride home today and said she will go pick up the prescriptions when ready.                Expected Discharge Plan: Home/Self Care Barriers to Discharge: Barriers Resolved   Patient Goals and CMS Choice     Choice offered to / list presented to : NA  Expected Discharge Plan and Services Expected Discharge Plan: Home/Self Care   Discharge Planning Services: Medication Assistance Post Acute Care Choice: NA Living arrangements for the past 2 months: Single Family Home Expected Discharge Date: 01/16/20                                    Prior Living Arrangements/Services Living arrangements for the past 2 months: Single Family Home Lives with:: Minor Children Patient language and need for interpreter reviewed:: Yes Do you feel safe going back to the place where you live?: Yes      Need for Family Participation in Patient Care: Yes (Comment) Care giver support system in place?: Yes (comment)   Criminal Activity/Legal Involvement Pertinent to Current Situation/Hospitalization: No - Comment as needed  Activities of Daily Living Home Assistive Devices/Equipment: None ADL Screening (condition at time of  admission) Patient's cognitive ability adequate to safely complete daily activities?: Yes Is the patient deaf or have difficulty hearing?: No Does the patient have difficulty seeing, even when wearing glasses/contacts?: No Does the patient have difficulty concentrating, remembering, or making decisions?: No Patient able to express need for assistance with ADLs?: Yes Does the patient have difficulty dressing or bathing?: No Independently performs ADLs?: Yes (appropriate for developmental age) Does the patient have difficulty walking or climbing stairs?: No Weakness of Legs: None Weakness of Arms/Hands: None  Permission Sought/Granted                  Emotional Assessment Appearance:: Appears stated age Attitude/Demeanor/Rapport: Engaged, Gracious Affect (typically observed): Accepting, Appropriate, Calm, Pleasant Orientation: : Oriented to Self, Oriented to Place, Oriented to  Time, Oriented to Situation Alcohol / Substance Use: Not Applicable Psych Involvement: No (comment)  Admission diagnosis:  AKI (acute kidney injury) ([place]) [N17.9] Acute kidney injury ([place]) [N17.9] Nephrotic syndrome [N04.9] Patient Active Problem List   Diagnosis Date Noted  . 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 Acute kidney injury ([place]) 01/10/2020  . Hypertensive urgency 01/10/2020  . Headache 01/10/2020  . Hyponatremia 01/10/2020  . Nephrotic syndrome, minimal change 01/10/2020  . Leukocytosis 01/10/2020  . Hypoalbuminemia 01/10/2020  . AKI (acute kidney injury) ([place]) 01/03/2020  . Intractable vomiting with nausea 01/03/2020  . Pedal edema 12/31/2019  . Productive cough 12/31/2019  . Night sweats 06/10/2019  . Skin lesion 06/10/2019  . H/O  hernia repair 06/10/2019  . History of smoking 10-25 pack years 07/14/2014  . Routine general medical examination at a health care facility 07/07/2014  . Umbilical hernia 59/45/8592  . Cesarean delivery delivered 01/19/2012  . IBS 08/15/2007  . 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 Anxiety disorder 08/14/2007  . DEPRESSION 08/14/2007  . ADD 08/14/2007   PCP:  Abner Greenspan, MD Pharmacy:   [facility], [place] [address] Phone: [phone] Fax: [phone]     Social Determinants of Health (SDOH) Interventions    Readmission Risk Interventions Readmission Risk Prevention Plan 01/16/2020  Transportation Screening Complete  PCP or Specialist Appt within 3-5 Days Complete  HRI or [place] (No Data)  Social Work Consult for [place] Planning/Counseling [place] Not Applicable  Medication Review Press photographer) Complete  Some recent data might be hidden

## 2020-01-16 NOTE — Discharge Summary (Signed)
[facility] Discharge Summary   Patient: Christine Rice [id number]  PCP: Abner Greenspan, MD  Date of admission: 01/10/2020   Date of discharge:  01/16/2020     Discharge Diagnoses:  Principal diagnosis Minimal change disease/nephrotic syndrome  Principal Problem:   Acute kidney injury ([place]) Active Problems:   Anxiety disorder   Intractable vomiting with nausea   Hypertensive urgency   Headache   Hyponatremia   Nephrotic syndrome, minimal change   Leukocytosis   Hypoalbuminemia   Minimal change disease   Admitted From: Home Disposition:  Home   Recommendations for Outpatient Follow-up:  1. PCP: In 1 week, continue to monitor BP at home 2. Follow up with nephrologist in 1 week and repeat BMP   Diet recommendation: Cardiac diet  Activity: The patient is advised to gradually reintroduce usual activities, as tolerated  Discharge Condition: stable  Code Status: Full code   History of present illness: As per the H and P dictated on admission Christine Rice a 39 y.o.femalewith medical history significant ofrecently diagnosed minimal change disease (biopsied last weekand just started steroid), anxiety who presented to the ED1/29with intractable vomiting and severe headache.Recent admission for samefrom 1/22 to 1/26. BP also uncontrolled on presentation, as patient unable to take her PRN oral clonidine. In the ED, Patient admitted with nephrology consulted. hypertensive 150/104 ->165/113 otherwise stable vitals. Labs notable for Na 130, CO2 21, BUN 61, Cr 3.68 (from 2.35 yesterday and 1.57 on 1/25), lipase 107, albumin 2.0, WBC 25k. Flu A/B and Covid-19 negative. Nephrology was consulted by ED physician, recommended admission for IV steroids, BP control, and gentle fluids for AKI.  Hospital Course:  Principal Problem:   Acute kidney injury [facility]) Active Problems:   Anxiety disorder   Intractable vomiting with nausea   Hypertensive urgency  Headache   Hyponatremia   Nephrotic syndrome, minimal change   Leukocytosis   Hypoalbuminemia   Minimal change disease   Acute Kidney Injury-Present on admission, superimposed on nephrotic syndrome.  Most likely pre-renal given vomiting and secondary dehydration. Minimal Change Disease / Nephrotic Syndrome Patient was seen by nephrologist, received IV Lasix and IV albumin, stress dose steroid Solu-Medrol 60 daily since 1/29, switched to oral prednisone 60 mg pod. Nephrologist recommended to continue Lasix 40 mg p.o. twice daily, prednisone 60 mg p.o. daily, PPI for GI prophylaxis, Bactrim 3 times a week for prophylaxis and losartan 100 mg p.o. daily to control blood pressure and Lipitor 40 mg p.o. daily for hyperlipidemia.  Cleared by nephrologist to discharge home and follow-up next week, BMP will be done as an outpatient.  Hypertensive Urgency - present on admission, improved. Had been discharged with PRN oral clonidine for uncontrolled BP, but unable to tolerate PO intake. Suspect this may be causing her headache.Use of clonidine also has issue of rebound hypertension. Patient was given hydralazine and labetalol IV as needed during hospital stay.  Clonidine has been discontinued, started losartan 100 mg p.o. daily as per nephrologist.  Patient was advised to monitor BP at home and follow with PCP and nephrologist for further management.  Heartburn-new onset evening of 1/30, was likely secondary to steroids.  Continue pantoprazole 40 mg daily and follow with PCP.  Intractable nausea/vomiting-resolved Unclear cause at this point,possibly uremiaor hypertensive urgency. ?New medications but doubtful as onset of N/V was before starting Prednisone, and she has tolerated Demadex without issues before N/V started. Lipase mildly elevated and with epigastric tenderness, could have mild case of acute pancreatitis.  Although symptoms  has been resolved, patient is tolerating diet well.  So  plan is to discharge home today.  Headache -resolved. Suspect due to uncontrolled hypertension. Improved once BP better controlled, s/p acetaminophen IV PRN, cool compresses PRN, room dark, quiet environment. Neurology consulted and also attributes her headaches to hypertensive urgency. Recommend considering magnesium and B12 supplements.  Tinea multivitamin.   Hyponatremia, mild, Na 130 on admission.Improved, Na 137 today.Given anasarca due to MCD, suspect hypervolemic.Expect improvement with diuretics. BMP after 1wk  Non-anion gap metabolic acidosis -resolved. Was most likely due to N/V Hypoalbuminemia- secondary to MCD Leukocytosis -WBC 25k on admission, no evidence of infection. Suspect reactive in setting in N/V in addition to steroids. Anxiety Disorder -try relaxing techniques, and social support.  Follow with PCP Body mass index is 29.17 kg/m.  Nutrition Interventions:      No Pain control/narcotics were not prescribed - so [place] Controlled Substance Reporting System database was not reviewed. - Patient was instructed, not to drive, operate heavy machinery, perform activities at heights, swimming or participation in water activities or provide baby sitting services while on Pain, Sleep and Anxiety Medications; until her outpatient Physician has advised to do so again.  - Also recommended to not to take more than prescribed Pain, Sleep and Anxiety Medications.  Patient was ambulatory without any assistance. Patient was not seen by physical therapy, no need of PT. On the day of the discharge the patient's vitals were stable, and no other acute medical condition were reported by patient. the patient was felt safe to be discharge at Home with no therapy needed on discharge.  Consultants: Nephrologist Dr. Juleen China Neurologist Dr Irish Elders  Procedures: None  Discharge Exam: General: Appear in no distress, no Rash; Oral Mucosa Clear, moist. Cardiovascular: S1  and S2 Present, no Murmur, Respiratory: normal respiratory effort, Bilateral Air entry present and no Crackles, no wheezes Abdomen: Bowel Sound present, Soft and no tenderness, no hernia Extremities: 2-3+ Pedal edema, no calf tenderness Neurology: alert and oriented to time, place, and person affect appropriate.  Filed Weights   01/14/20 0800 01/15/20 0428 01/16/20 0500  Weight: 83.9 kg 85 kg 82 kg   Vitals:   01/16/20 0421 01/16/20 1150  BP: (!) 158/97 (!) 153/114  Pulse: 70 81  Resp: 20 18  Temp: 98.2 F (36.8 C) 98.6 F (37 C)  SpO2: 99% 98%    DISCHARGE MEDICATION: Allergies as of 01/16/2020      Reactions   Amoxicillin Hives   REACTION: rash   Penicillins Hives      Medication List    STOP taking these medications   cloNIDine 0.1 MG tablet Commonly known as: Catapres   torsemide 20 MG tablet Commonly known as: DEMADEX     TAKE these medications   atorvastatin 40 MG tablet Commonly known as: LIPITOR Take 1 tablet (40 mg total) by mouth daily at 6 PM.   furosemide 40 MG tablet Commonly known as: LASIX Take 1 tablet (40 mg total) by mouth 2 (two) times daily.   losartan 100 MG tablet Commonly known as: COZAAR Take 1 tablet (100 mg total) by mouth daily.   multivitamin tablet Take 1 tablet by mouth daily.   pantoprazole 40 MG tablet Commonly known as: PROTONIX Take 1 tablet (40 mg total) by mouth daily. Start taking on: January 17, 2020   predniSONE 20 MG tablet Commonly known as: DELTASONE Take 60 mg by mouth daily.   promethazine 12.5 MG tablet Commonly known as: PHENERGAN Take 12.5 mg  by mouth every 6 (six) hours as needed for nausea or vomiting.   sulfamethoxazole-trimethoprim 400-80 MG tablet Commonly known as: BACTRIM Take 1 tablet by mouth 3 (three) times a week. Start taking on: January 17, 2020      Allergies  Allergen Reactions  . Amoxicillin Hives    REACTION: rash  . Penicillins Hives   Discharge Instructions    Diet - low  sodium heart healthy   Complete by: As directed    Discharge instructions   Complete by: As directed    Follow-up with PCP in 1 week, continue to monitor BP at home and follow with PCP and nephrologist to titrate medications Follow-up with nephrologist next week and repeat BMP after 1 week   Increase activity slowly   Complete by: As directed       The results of significant diagnostics from this hospitalization (including imaging, microbiology, ancillary and laboratory) are listed below for reference.    Significant Diagnostic Studies: DG Chest 2 View  Result Date: 01/01/2020 CLINICAL DATA:  Lower extremity edema with weight gain EXAM: CHEST - 2 VIEW COMPARISON:  None. FINDINGS: There are small pleural effusions bilaterally. Lungs otherwise are clear. Heart size and pulmonary vascularity are normal. No adenopathy. No bone lesions. IMPRESSION: Small pleural effusions bilaterally. Lungs otherwise clear. Heart size normal. No adenopathy. Electronically Signed   By: Lowella Grip III M.D.   On: 01/01/2020 12:03   CT HEAD WO CONTRAST  Result Date: 01/07/2020 CLINICAL DATA:  Initial evaluation for acute severe headache. EXAM: CT HEAD WITHOUT CONTRAST TECHNIQUE: Contiguous axial images were obtained from the base of the skull through the vertex without intravenous contrast. COMPARISON:  None. FINDINGS: Brain: Cerebral volume within normal limits for patient age. No evidence for acute intracranial hemorrhage. No findings to suggest acute large vessel territory infarct. No mass lesion, midline shift, or mass effect. Ventricles are normal in size without evidence for hydrocephalus. No extra-axial fluid collection identified. Vascular: No hyperdense vessel identified. Skull: Scalp soft tissues demonstrate no acute abnormality. Calvarium intact. Sinuses/Orbits: Globes and orbital soft tissues within normal limits. Mild scattered mucosal thickening noted within the ethmoidal air cells. Paranasal sinuses  are otherwise clear. No mastoid effusion. IMPRESSION: Negative head CT.  No acute intracranial abnormality. Electronically Signed   By: Jeannine Boga M.D.   On: 01/07/2020 02:14   US RENAL  Result Date: 01/03/2020 CLINICAL DATA:  Proteinuria.  Acute renal failure. EXAM: RENAL / URINARY TRACT ULTRASOUND COMPLETE COMPARISON:  None. FINDINGS: Right Kidney: Renal measurements: 13.6 x 5.8 x 4.8 cm = volume: 201 mL. Minimally increased echogenicity of renal parenchyma is noted. No mass or hydronephrosis visualized. Left Kidney: Renal measurements: 12.3 x 5.6 x 5.4 cm = volume: 195 mL. Minimally increased echogenicity of renal parenchyma is noted. No mass or hydronephrosis visualized. Bladder: Appears normal for degree of bladder distention. Other: None. IMPRESSION: Minimally increased echogenicity of renal parenchyma is noted bilaterally suggesting medical renal disease. No hydronephrosis or renal obstruction is noted. Electronically Signed   By: Marijo Conception M.D.   On: 01/03/2020 10:59   DG Abd 2 Views  Result Date: 01/03/2020 CLINICAL DATA:  Nausea and vomiting EXAM: ABDOMEN - 2 VIEW COMPARISON:  None. FINDINGS: Tiny pleural effusions. Mild airspace disease at the left base. No free air beneath the diaphragm. Nonobstructed bowel-gas pattern. Few air-filled central small bowel loops with a few fluid levels but no distension. No radiopaque calculi. IUD in the pelvis. IMPRESSION: 1. Nonobstructed gas pattern 2.  Small pleural effusions and mild left basilar airspace disease, atelectasis versus pneumonia Electronically Signed   By: Donavan Foil M.D.   On: 01/03/2020 17:15   US BIOPSY (KIDNEY)  Result Date: 01/06/2020 Anthonette Legato, MD     01/06/2020 12:00 PM After obtaining informed consent, the patient was brought down to the ultrasound suite. Subsequently the left kidney was identified under ultrasound. The left flank was prepped and draped in standard sterile fashion. Local anesthesia was  achieved using 1% lidocaine. Subsequently using an 18-gauge biopsy device and ultraound guidance, a total of 3 passes were made into the left kidney. The specimens were submitted to pathology and deemed to be adequate for submission. No active bleeding noted on post-biopsy images. The patient tolerated the procedure very well. She will return to herroom for continued observation. Estimated blood loss: Minimal. Complications: No immediate complications.   Microbiology: Recent Results (from the past 240 hour(s))  Respiratory Panel by RT PCR (Flu A&B, Covid) - Nasopharyngeal Swab     Status: None   Collection Time: 01/10/20  5:34 PM   Specimen: Nasopharyngeal Swab  Result Value Ref Range Status   SARS Coronavirus 2 by RT PCR NEGATIVE NEGATIVE Final    Comment: (NOTE) SARS-CoV-2 target nucleic acids are NOT DETECTED. The SARS-CoV-2 RNA is generally detectable in upper respiratoy specimens during the acute phase of infection. The lowest concentration of SARS-CoV-2 viral copies this assay can detect is 131 copies/mL. A negative result does not preclude SARS-Cov-2 infection and should not be used as the sole basis for treatment or other patient management decisions. A negative result may occur with  improper specimen collection/handling, submission of specimen other than nasopharyngeal swab, presence of viral mutation(s) within the areas targeted by this assay, and inadequate number of viral copies (<131 copies/mL). A negative result must be combined with clinical observations, patient history, and epidemiological information. The expected result is Negative. Fact Sheet for Patients:  PinkCheek.be Fact Sheet for Healthcare Providers:  GravelBags.it This test is not yet ap proved or cleared by the [place] FDA and  has been authorized for detection and/or diagnosis of SARS-CoV-2 by FDA under an Emergency Use Authorization  (EUA). This EUA will remain  in effect (meaning this test can be used) for the duration of the COVID-19 declaration under Section 564(b)(1) of the Act, 21 U.S.C. section 360bbb-3(b)(1), unless the authorization is terminated or revoked sooner.    Influenza A by PCR NEGATIVE NEGATIVE Final   Influenza B by PCR NEGATIVE NEGATIVE Final    Comment: (NOTE) The Xpert Xpress SARS-CoV-2/FLU/RSV assay is intended as an aid in  the diagnosis of influenza from Nasopharyngeal swab specimens and  should not be used as a sole basis for treatment. Nasal washings and  aspirates are unacceptable for Xpert Xpress SARS-CoV-2/FLU/RSV  testing. Fact Sheet for Patients: PinkCheek.be Fact Sheet for Healthcare Providers: GravelBags.it This test is not yet approved or cleared by the [place] FDA and  has been authorized for detection and/or diagnosis of SARS-CoV-2 by  FDA under an Emergency Use Authorization (EUA). This EUA will remain  in effect (meaning this test can be used) for the duration of the  Covid-19 declaration under Section 564(b)(1) of the Act, 21  U.S.C. section 360bbb-3(b)(1), unless the authorization is  terminated or revoked. Performed at [facility], [address] [place], [place] [address]      Labs: CBC: Recent Labs  Lab 01/10/20 1325 01/11/20 0455 01/14/20 0525 01/15/20 0656 01/16/20 0429  WBC  25.1* 10.9* 12.0* 11.2* 15.8*  HGB 14.3 12.2 12.0 11.0* 11.9*  HCT 40.9 35.5* 34.4* 32.4* 34.5*  MCV 89.7 89.9 89.1 90.8 90.1  PLT 313 280 324 319 [id number]   Basic Metabolic Panel: Recent Labs  Lab 01/11/20 0455 01/11/20 0455 01/12/20 0517 01/13/20 0527 01/14/20 0525 01/15/20 0656 01/16/20 0429  NA 131*   < > 132* 130* 133* 139 137  K 4.7   < > 5.0 5.0 5.1 4.6 4.5  CL 104   < > 104 102 104 105 105  CO2 24   < > 22 22 25 24 28   GLUCOSE 93   < > 96 91 99 79 77  BUN 63*   < > 65* 70* 66* 58* 43*   CREATININE 3.81*   < > 2.80* 2.11* 1.79* 1.47* 1.13*  CALCIUM 7.6*   < > 7.7* 7.6* 7.9* 8.2* 8.3*  MG 2.4  --  2.7*  --   --  2.7* 2.5*  PHOS  --   --   --   --   --   --  3.8   < > = values in this interval not displayed.   Liver Function Tests: Recent Labs  Lab 01/11/20 0455 01/12/20 0517 01/13/20 0527 01/14/20 0525 01/15/20 0656  AST 13* 14* 13* 11* 10*  ALT 13 12 13 11 9   ALKPHOS 41 52 51 44 37*  BILITOT 0.4 0.3 0.4 0.4 0.5  PROT 4.6* 4.5* 4.2* 4.4* 4.5*  ALBUMIN 1.7* 1.6* 1.5* 2.0* 2.5*   Recent Labs  Lab 01/10/20 1325  LIPASE 107*   No results for input(s): AMMONIA in the last 168 hours. Cardiac Enzymes: No results for input(s): CKTOTAL, CKMB, CKMBINDEX, TROPONINI in the last 168 hours. BNP (last 3 results) Recent Labs    01/01/20 1141  BNP 173.0*   CBG: No results for input(s): GLUCAP in the last 168 hours.  Time spent: 35 minutes  Signed:  Val Riles MD [facility]  01/16/2020 12:01 PM

## 2020-01-16 NOTE — Progress Notes (Signed)
[facility]  ROUNDING NOTE   Subjective:   Creatinine 1.1 (1.47)  Objective:  Vital signs in last 24 hours:  Temp:  [98.2 F (36.8 C)-98.6 F (37 C)] 98.6 F (37 C) (02/04 1150) Pulse Rate:  [68-81] 81 (02/04 1150) Resp:  [18-20] 18 (02/04 1150) BP: (141-158)/(96-114) 153/114 (02/04 1150) SpO2:  [98 %-99 %] 98 % (02/04 1150) Weight:  [82 kg] 82 kg (02/04 0500)  Weight change: -1.905 kg Filed Weights   01/14/20 0800 01/15/20 0428 01/16/20 0500  Weight: 83.9 kg 85 kg 82 kg    Intake/Output: I/O last 3 completed shifts: In: 440 [P.O.:240; IV Piggyback:200] Out: 300 [Urine:300]   Intake/Output this shift:  Total I/O In: 240 [P.O.:240] Out: 900 [Urine:900]  Physical Exam: General: NAD,   Head: Normocephalic, atraumatic. Moist oral mucosal membranes  Eyes: Anicteric, PERRL  Neck: Supple, trachea midline  Lungs:  Clear to auscultation  Heart: Regular rate and rhythm  Abdomen:  Soft, nontender  Extremities: + peripheral edema.  Neurologic: Nonfocal, moving all four extremities  Skin: No lesions        Basic Metabolic Panel: Recent Labs  Lab 01/11/20 0455 01/11/20 0455 01/12/20 0517 01/12/20 0517 01/13/20 0527 01/13/20 0527 01/14/20 0525 01/15/20 0656 01/16/20 0429  NA 131*   < > 132*  --  130*  --  133* 139 137  K 4.7   < > 5.0  --  5.0  --  5.1 4.6 4.5  CL 104   < > 104  --  102  --  104 105 105  CO2 24   < > 22  --  22  --  25 24 28   GLUCOSE 93   < > 96  --  91  --  99 79 77  BUN 63*   < > 65*  --  70*  --  66* 58* 43*  CREATININE 3.81*   < > 2.80*  --  2.11*  --  1.79* 1.47* 1.13*  CALCIUM 7.6*   < > 7.7*   < > 7.6*   < > 7.9* 8.2* 8.3*  MG 2.4  --  2.7*  --   --   --   --  2.7* 2.5*  PHOS  --   --   --   --   --   --   --   --  3.8   < > = values in this interval not displayed.    Liver Function Tests: Recent Labs  Lab 01/11/20 0455 01/12/20 0517 01/13/20 0527 01/14/20 0525 01/15/20 0656  AST 13* 14* 13* 11* 10*  ALT 13 12  13 11 9   ALKPHOS 41 52 51 44 37*  BILITOT 0.4 0.3 0.4 0.4 0.5  PROT 4.6* 4.5* 4.2* 4.4* 4.5*  ALBUMIN 1.7* 1.6* 1.5* 2.0* 2.5*   Recent Labs  Lab 01/10/20 1325  LIPASE 107*   No results for input(s): AMMONIA in the last 168 hours.  CBC: Recent Labs  Lab 01/10/20 1325 01/11/20 0455 01/14/20 0525 01/15/20 0656 01/16/20 0429  WBC 25.1* 10.9* 12.0* 11.2* 15.8*  HGB 14.3 12.2 12.0 11.0* 11.9*  HCT 40.9 35.5* 34.4* 32.4* 34.5*  MCV 89.7 89.9 89.1 90.8 90.1  PLT 313 280 324 319 346    Cardiac Enzymes: No results for input(s): CKTOTAL, CKMB, CKMBINDEX, TROPONINI in the last 168 hours.  BNP: Invalid input(s): POCBNP  CBG: No results for input(s): GLUCAP in the last 168 hours.  Microbiology: Results for orders placed or  performed during the hospital encounter of 01/10/20  Respiratory Panel by RT PCR (Flu A&B, Covid) - Nasopharyngeal Swab     Status: None   Collection Time: 01/10/20  5:34 PM   Specimen: Nasopharyngeal Swab  Result Value Ref Range Status   SARS Coronavirus 2 by RT PCR NEGATIVE NEGATIVE Final    Comment: (NOTE) SARS-CoV-2 target nucleic acids are NOT DETECTED. The SARS-CoV-2 RNA is generally detectable in upper respiratoy specimens during the acute phase of infection. The lowest concentration of SARS-CoV-2 viral copies this assay can detect is 131 copies/mL. A negative result does not preclude SARS-Cov-2 infection and should not be used as the sole basis for treatment or other patient management decisions. A negative result may occur with  improper specimen collection/handling, submission of specimen other than nasopharyngeal swab, presence of viral mutation(s) within the areas targeted by this assay, and inadequate number of viral copies (<131 copies/mL). A negative result must be combined with clinical observations, patient history, and epidemiological information. The expected result is Negative. Fact Sheet for Patients:   PinkCheek.be Fact Sheet for Healthcare Providers:  GravelBags.it This test is not yet ap proved or cleared by the [place] FDA and  has been authorized for detection and/or diagnosis of SARS-CoV-2 by FDA under an Emergency Use Authorization (EUA). This EUA will remain  in effect (meaning this test can be used) for the duration of the COVID-19 declaration under Section 564(b)(1) of the Act, 21 U.S.C. section 360bbb-3(b)(1), unless the authorization is terminated or revoked sooner.    Influenza A by PCR NEGATIVE NEGATIVE Final   Influenza B by PCR NEGATIVE NEGATIVE Final    Comment: (NOTE) The Xpert Xpress SARS-CoV-2/FLU/RSV assay is intended as an aid in  the diagnosis of influenza from Nasopharyngeal swab specimens and  should not be used as a sole basis for treatment. Nasal washings and  aspirates are unacceptable for Xpert Xpress SARS-CoV-2/FLU/RSV  testing. Fact Sheet for Patients: PinkCheek.be Fact Sheet for Healthcare Providers: GravelBags.it This test is not yet approved or cleared by the [place] FDA and  has been authorized for detection and/or diagnosis of SARS-CoV-2 by  FDA under an Emergency Use Authorization (EUA). This EUA will remain  in effect (meaning this test can be used) for the duration of the  Covid-19 declaration under Section 564(b)(1) of the Act, 21  U.S.C. section 360bbb-3(b)(1), unless the authorization is  terminated or revoked. Performed at [facility], [address] [place], [place] [address]     Coagulation Studies: No results for input(s): LABPROT, INR in the last 72 hours.  Urinalysis: No results for input(s): COLORURINE, LABSPEC, PHURINE, GLUCOSEU, HGBUR, BILIRUBINUR, KETONESUR, PROTEINUR, UROBILINOGEN, NITRITE, LEUKOCYTESUR in the last 72 hours.  Invalid input(s): APPERANCEUR    Imaging: No results  found.   Medications:    . atorvastatin  40 mg Oral q1800  . furosemide  40 mg Oral BID  . heparin  5,000 Units Subcutaneous Q12H  . losartan  100 mg Oral Daily  . multivitamin with minerals  1 tablet Oral Daily  . pantoprazole  40 mg Oral Daily  . predniSONE  60 mg Oral Q breakfast  . sulfamethoxazole-trimethoprim  1 tablet Oral Once per day on Mon Wed Fri   ALPRAZolam, alum & mag hydroxide-simeth, bisacodyl, calcium carbonate, hydrALAZINE, labetalol, ondansetron **OR** ondansetron (ZOFRAN) IV, phenol, polyethylene glycol, promethazine, traMADol, traZODone  Assessment/ Plan:  Christine Rice is a 39 y.o. white female with IBS, ADHD, depression with new onset nephrotic  syndrome. 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  1. Acute renal failure 2. Anasarca/hypoalbuminemia 3. Nephrotic range proteinuria 4. Hematuria  5. Hypertension 6. Headache 7. Nausea/vomiting   Suspect headache, nausea and vomiting are secondary to hypertensive urgency. Have improved with blood pressure control   Plan  - PO furosemide 40mg  bid.  - Stress dose steroids: PO 60mg  daily   - ASA 81 - TMP/SMX for infection prophylaxis.  - Start losartan 100mg  daily - start atorvastatin 40mg  daily  Follow up with Nephrology next week.    LOS: 6 Christine Rice 2/4/20211:29 PM

## 2020-02-03 ENCOUNTER — Other Ambulatory Visit: Payer: Self-pay

## 2020-02-03 ENCOUNTER — Ambulatory Visit: Payer: Medicaid Other | Admitting: Pharmacy Technician

## 2020-02-03 DIAGNOSIS — Z79899 Other long term (current) drug therapy: Secondary | ICD-10-CM

## 2020-02-03 NOTE — Progress Notes (Signed)
Provided patient with financial assistance application for [place] due to recent hospital visit.  Patient agreed to be responsible for gathering financial information and forwarding to appropriate department in [facility].    Completed [facility] application and contract.  Patient agreed to all terms of the [facility] contract.    Patient still needs to provide [place] Tax Return.  Patient will need to provide this information by 04/11/20 in order to continue to receive medication assistance.   Provided patient with community resource material based on her particular needs.    Referred patient for MTM.  [facility]

## 2020-02-24 ENCOUNTER — Other Ambulatory Visit: Payer: Self-pay

## 2020-02-24 ENCOUNTER — Ambulatory Visit: Payer: Medicaid Other

## 2020-02-24 DIAGNOSIS — Z79899 Other long term (current) drug therapy: Secondary | ICD-10-CM

## 2020-02-24 NOTE — Progress Notes (Addendum)
Medication Management Clinic Visit Note  Patient: Christine Rice MRN: [id number] Date of Birth: January 09, 1981 PCP: Abner Greenspan, MD   Eileen Stanford 39 y.o. female presents for a telephone medication therapy management visit today. Patient identity verified using two patient identifiers.  There were no vitals taken for this visit.  Patient Information   Past Medical History:  Diagnosis Date  . Anxiety   . Nephrotic syndrome       Past Surgical History:  Procedure Laterality Date  . CESAREAN SECTION  01/06/05  . CESAREAN SECTION  01/19/2012   Procedure: CESAREAN SECTION;  Surgeon: Luz Lex, MD;  Location: [facility];  Service: Gynecology;  Laterality: N/A;  repeat  . HERNIA REPAIR    . INGUINAL HERNIA REPAIR  01/19/2012   Procedure: HERNIA REPAIR INGUINAL ADULT BILATERAL;  Surgeon: Adin Hector, MD;  Location: [facility];  Service: General;  Laterality: Bilateral;  . RENAL BIOPSY, PERCUTANEOUS  01/06/2020      . tummy tuck    . UMBILICAL HERNIA REPAIR  01/19/2012   Procedure: HERNIA REPAIR UMBILICAL ADULT;  Surgeon: Adin Hector, MD;  Location: [facility];  Service: General;  Laterality: N/A;  . UMBILICAL HERNIA REPAIR  11-19-14     Family History  Problem Relation Age of Onset  . Rashes / Skin problems Mother   . COPD Father   . Breast cancer Neg Hx   . Cancer Neg Hx   . Stroke Neg Hx     New Diagnoses (since last visit): Minimal change disease with nephrotic syndrome    Social History   Substance and Sexual Activity  Alcohol Use Yes  . Alcohol/week: 0.0 standard drinks   Comment: Once in a while.   Infrequently   Social History   Tobacco Use  Smoking Status Former Smoker  . Types: Cigarettes  . Quit date: 11/15/2019  . Years since quitting: 0.2  Smokeless Tobacco Never Used   Denies tobacco. Smokes marijuana daily.   Health Maintenance  Topic Date Due  . INFLUENZA VACCINE  07/13/2019  . PAP SMEAR-Modifier  07/24/2022  . TETANUS/TDAP  07/14/2024  . HIV  Screening  Completed   Outpatient Encounter Medications as of 02/24/2020  Medication Sig  . ALPRAZolam (XANAX) 0.25 MG tablet Take 0.25 mg by mouth daily as needed for anxiety. Patient reports she takes 1/2 tablet every 1-2 weeks  . aspirin 81 MG EC tablet Take 81 mg by mouth daily.  Marland Kitchen atorvastatin (LIPITOR) 40 MG tablet Take 1 tablet (40 mg total) by mouth daily at 6 PM.  . hydrOXYzine (VISTARIL) 25 MG capsule Take 25 mg by mouth every 6 (six) hours as needed for anxiety.  Marland Kitchen levonorgestrel (MIRENA) 20 MCG/24HR IUD 1 each by Intrauterine route once.  Marland Kitchen losartan (COZAAR) 100 MG tablet Take 100 mg by mouth daily.  . Multiple Vitamin (MULTIVITAMIN) tablet Take 1 tablet by mouth daily.  . pantoprazole (PROTONIX) 40 MG tablet Take 1 tablet (40 mg total) by mouth daily.  . predniSONE (DELTASONE) 20 MG tablet Take 40 mg by mouth daily.  Marland Kitchen sulfamethoxazole-trimethoprim (BACTRIM) 400-80 MG tablet Take 1 tablet by mouth 3 (three) times a week.  . torsemide (DEMADEX) 10 MG tablet Take 10 mg by mouth daily as needed (edema).  . [DISCONTINUED] furosemide (LASIX) 40 MG tablet Take 1 tablet (40 mg total) by mouth 2 (two) times daily.  . [DISCONTINUED] losartan (COZAAR) 100 MG tablet Take 1 tablet (100 mg total) by mouth daily.  . [  DISCONTINUED] promethazine (PHENERGAN) 12.5 MG tablet Take 12.5 mg by mouth every 6 (six) hours as needed for nausea or vomiting.   No facility-administered encounter medications on file as of 02/24/2020.    Health Maintenance/Date Completed  Last ED visit: Admitted at [facility] 01/10/2020 thru 01/16/2020 and 01/03/2020 thru 01/07/2020 for nephrotic syndrome, ED visits 12/31/2019 and 01/01/2020 for edema Last Visit to PCP: 12/31/2019 with Dr. Glori Bickers [facility]) Next Visit to PCP: Unknown Specialist Visit: Nephrology (Dr. Juleen China) last visit 02/19/2020 Pelvic/PAP Exam: Annual pap smears Mammogram: Not screening yet. No family hx of breast cancer Colonoscopy: Never Flu Vaccine:  Elects not to receive Pneumonia Vaccine: Never COVID-19 Vaccine: Never Shingrix Vaccine: <50 y/o   Assessment and Plan:  1. Minimal change disease with nephrotic syndrome -Recently hospitalized at [facility]  -Following with nephrology for management -Steroid taper with prednisone on 40 mg daily currently -Losartan 100 mg daily for renal protection/proteinuria -Torsemide 10 mg PRN edema (not requiring) -Bactrim 1 SS three times weekly for PJP prophylaxis while on steroids -Pantoprazole 40 mg daily for steroid associated dyspepsia -Hydroxyzine PRN steroid related anxiety -ASA 81 mg daily for VTE prophylaxis  2. Hyperlipidemia -Atorvastatin 40 mg daily -Lipid panel on 01/16/2020 with HDL 49, LDL 261, TG 194  3. Hypertension -Losartan 100 mg daily  4. Anxiety -Patient reports taking alprazolam 0.125 mg (1/2 tablet) every 1-2 weeks as needed  5. Contraception -Mirena IUD -Estrogen containing contraceptives should be avoided due to renal disease  6. Adherence -Patient reports adherence to prescribed medications -Utilizes a pillbox -Requesting losartan to be received thru [facility]. Will follow-up  RTC 1 year  [place] Resident 24 February 2020

## 2020-02-27 ENCOUNTER — Encounter (INDEPENDENT_AMBULATORY_CARE_PROVIDER_SITE_OTHER): Payer: Self-pay

## 2020-03-26 ENCOUNTER — Other Ambulatory Visit: Payer: Self-pay | Admitting: Family Medicine

## 2020-03-26 NOTE — Telephone Encounter (Signed)
Will route to PCP for review. Med was last filled by Dr. Glori Bickers on 02/15/19 #30 tabs 0 refills, then it was last prescribed by [facility] on 01/03/20, and 01/10/20, then it was added to med list as a historical entry by a pharmacist on 02/24/20,   Last OV with pt was a doxy for a cough but no recent or future f/u or CPE appts  [place]

## 2020-04-13 ENCOUNTER — Telehealth: Payer: Self-pay | Admitting: Family Medicine

## 2020-04-13 NOTE — Telephone Encounter (Signed)
I saw the flag from her nephrologist- she had some chest discomfort in their office (most likely muscular) Please schedule appt with me to eval and ekg  Thanks

## 2020-04-13 NOTE — Telephone Encounter (Signed)
Patient called She stated that [facility] sent over information that stated the patient is needing to have an EKG done. She wanted to know if you have received this from them and if she can schedule the appointment to have this done  Please advise

## 2020-04-13 NOTE — Telephone Encounter (Signed)
Pt notified of Dr. Tower's comments and appt scheduled  

## 2020-04-14 ENCOUNTER — Encounter: Payer: Self-pay | Admitting: Family Medicine

## 2020-04-14 ENCOUNTER — Other Ambulatory Visit: Payer: Self-pay

## 2020-04-14 ENCOUNTER — Ambulatory Visit (INDEPENDENT_AMBULATORY_CARE_PROVIDER_SITE_OTHER)
Admission: RE | Admit: 2020-04-14 | Discharge: 2020-04-14 | Disposition: A | Discharge status: Home / Self Care | Payer: Medicaid Other | Source: Ambulatory Visit | Attending: Family Medicine | Admitting: Family Medicine

## 2020-04-14 ENCOUNTER — Ambulatory Visit (INDEPENDENT_AMBULATORY_CARE_PROVIDER_SITE_OTHER): Payer: Medicaid Other | Admitting: Family Medicine

## 2020-04-14 VITALS — BP 118/74 | HR 75 | Temp 98.3°F | Ht 65.5 in | Wt 171.6 lb

## 2020-04-14 DIAGNOSIS — R0789 Other chest pain: Secondary | ICD-10-CM | POA: Diagnosis not present

## 2020-04-14 DIAGNOSIS — N050 Unspecified nephritic syndrome with minor glomerular abnormality: Secondary | ICD-10-CM | POA: Diagnosis not present

## 2020-04-14 DIAGNOSIS — L600 Ingrowing nail: Secondary | ICD-10-CM | POA: Insufficient documentation

## 2020-04-14 DIAGNOSIS — Z87891 Personal history of nicotine dependence: Secondary | ICD-10-CM | POA: Diagnosis not present

## 2020-04-14 NOTE — Assessment & Plan Note (Signed)
Commended on smoking cessation  Doing well!

## 2020-04-14 NOTE — Progress Notes (Signed)
Subjective:    Patient ID: Christine Rice, female    DOB: 08-21-1981, 39 y.o.   MRN: [id number]  This visit occurred during the SARS-CoV-2 public health emergency.  Safety protocols were in place, including screening questions prior to the visit, additional usage of staff PPE, and extensive cleaning of exam room while observing appropriate contact time as indicated for disinfecting solutions.    HPI Pt presents with chest discomfort (noted at her nephrology visit) likely msk in origin   Also ingrown toe nail   Wt Readings from Last 3 Encounters:  04/14/20 171 lb 9 oz (77.8 kg)  01/16/20 180 lb 11.2 oz (82 kg)  01/07/20 179 lb 1.6 oz (81.2 kg)   28.12 kg/m   She is being tx for minimal change dz/ renal nephrotic syndrome   BP Readings from Last 3 Encounters:  04/14/20 118/74  01/16/20 (!) 153/114  01/07/20 130/83   Pulse Readings from Last 3 Encounters:  04/14/20 75  01/16/20 81  01/07/20 60     EKG today NSR rate 71 Read as anteroseptal infarct ? Age- but not seen by me  No change from prior ekg-read as the same   Quit smoking 5 mo ago  She initially vaped -? If an allergy to this caused the kidney issue   Having cp for over a month   Had signed up for a ropes course -was doing some training but was too nervous to proceed past that   She has had chest pain and heart fluttering  Center or left to it  Stabbing/sharp -radiates to the shoulder blade  Episodes last up to 2 hours  - twice at night and twice in the am  bp is labile   (has been with kidney problems) - better now  Goes up to 145/90s at night occasionally  That scares her   Has fleeting episodes of sob - need to take deep breath occasionally   Takes xanax for anxiety- is helping with the changes in prednisone  Down to 10 mg prednisone daily    Patient Active Problem List   Diagnosis Date Noted  . Atypical chest pain 04/14/2020  . Ingrown nail of great toe of right foot 04/14/2020  . Minimal  change disease 01/14/2020  . Acute kidney injury ([place]) 01/10/2020  . Hypertensive urgency 01/10/2020  . Headache 01/10/2020  . Hyponatremia 01/10/2020  . Nephrotic syndrome, minimal change 01/10/2020  . Leukocytosis 01/10/2020  . Hypoalbuminemia 01/10/2020  . AKI (acute kidney injury) ([place]) 01/03/2020  . Intractable vomiting with nausea 01/03/2020  . Pedal edema 12/31/2019  . Night sweats 06/10/2019  . Skin lesion 06/10/2019  . H/O hernia repair 06/10/2019  . History of smoking 10-25 pack years 07/14/2014  . Routine general medical examination at a health care facility 07/07/2014  . Umbilical hernia 0000000  . Cesarean delivery delivered 01/19/2012  . IBS 08/15/2007  . 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 Anxiety disorder 08/14/2007  . DEPRESSION 08/14/2007  . ADD 08/14/2007   Past Medical History:  Diagnosis Date  . Anxiety   . Nephrotic syndrome    Past Surgical History:  Procedure Laterality Date  . CESAREAN SECTION  01/06/05  . CESAREAN SECTION  01/19/2012   Procedure: CESAREAN SECTION;  Surgeon: Luz Lex, MD;  Location: [facility];  Service: Gynecology;  Laterality: N/A;  repeat  . HERNIA REPAIR    . INGUINAL HERNIA REPAIR  01/19/2012   Procedure: HERNIA REPAIR INGUINAL ADULT BILATERAL;  Surgeon: Adin Hector,  MD;  Location: [facility];  Service: General;  Laterality: Bilateral;  . RENAL BIOPSY, PERCUTANEOUS  01/06/2020      . tummy tuck    . UMBILICAL HERNIA REPAIR  01/19/2012   Procedure: HERNIA REPAIR UMBILICAL ADULT;  Surgeon: Adin Hector, MD;  Location: [facility];  Service: General;  Laterality: N/A;  . UMBILICAL HERNIA REPAIR  11-19-14   Social History   Tobacco Use  . Smoking status: Former Smoker    Types: Cigarettes    Quit date: 11/15/2019    Years since quitting: 0.4  . Smokeless tobacco: Never Used  Substance Use Topics  . Alcohol use: Yes    Alcohol/week: 0.0 standard drinks    Comment: Once in a while.  . Drug use: Yes    Types: Marijuana    Comment: occ- marijuana     Family History  Problem Relation Age of Onset  . Rashes / Skin problems Mother   . COPD Father   . Breast cancer Neg Hx   . Cancer Neg Hx   . Stroke Neg Hx    Allergies  Allergen Reactions  . Amoxicillin Hives    REACTION: rash  No associated shortness of breath or throat swelling  . Penicillins Hives   Current Outpatient Medications on File Prior to Visit  Medication Sig Dispense Refill  . ALPRAZolam (XANAX) 0.25 MG tablet TAKE 1 TABLET BY MOUTH DAILY AS NEEDED FOR ANXIETY 30 tablet 0  . aspirin 81 MG EC tablet Take 81 mg by mouth daily.    Marland Kitchen atorvastatin (LIPITOR) 40 MG tablet Take 1 tablet (40 mg total) by mouth daily at 6 PM. 30 tablet 2  . hydrOXYzine (VISTARIL) 25 MG capsule Take 25 mg by mouth every 6 (six) hours as needed for anxiety.    Marland Kitchen levonorgestrel (MIRENA) 20 MCG/24HR IUD 1 each by Intrauterine route once.    . Multiple Vitamin (MULTIVITAMIN) tablet Take 1 tablet by mouth daily.    . pantoprazole (PROTONIX) 40 MG tablet Take 1 tablet (40 mg total) by mouth daily. 30 tablet 2  . predniSONE (DELTASONE) 20 MG tablet Take 40 mg by mouth daily.    Marland Kitchen losartan (COZAAR) 100 MG tablet Take 100 mg by mouth daily.     No current facility-administered medications on file prior to visit.     Review of Systems  Constitutional: Negative for activity change, appetite change, fatigue, fever and unexpected weight change.  HENT: Negative for congestion, ear pain, rhinorrhea, sinus pressure and sore throat.   Eyes: Negative for pain, redness and visual disturbance.  Respiratory: Negative for cough, chest tightness, shortness of breath and wheezing.   Cardiovascular: Positive for chest pain and palpitations. Negative for leg swelling.  Gastrointestinal: Negative for abdominal distention, abdominal pain, anal bleeding, blood in stool, constipation, diarrhea and nausea.  Endocrine: Negative for polydipsia and polyuria.  Genitourinary: Negative for dysuria, frequency and urgency.    Musculoskeletal: Negative for arthralgias, back pain and myalgias.  Skin: Negative for pallor and rash.       Ingrown toe nail  Allergic/Immunologic: Negative for environmental allergies.  Neurological: Negative for dizziness, syncope and headaches.  Hematological: Negative for adenopathy. Does not bruise/bleed easily.  Psychiatric/Behavioral: Negative for decreased concentration and dysphoric mood. The patient is nervous/anxious.        Objective:   Physical Exam Constitutional:      General: She is not in acute distress.    Appearance: Normal appearance. She is well-developed and normal weight. She  is not ill-appearing.  HENT:     Head: Normocephalic and atraumatic.     Mouth/Throat:     Mouth: Mucous membranes are moist.  Eyes:     General: No scleral icterus.    Conjunctiva/sclera: Conjunctivae normal.     Pupils: Pupils are equal, round, and reactive to light.  Neck:     Thyroid: No thyromegaly.     Vascular: No carotid bruit or JVD.  Cardiovascular:     Rate and Rhythm: Normal rate and regular rhythm.     Heart sounds: Normal heart sounds. No gallop.   Pulmonary:     Effort: Pulmonary effort is normal. No respiratory distress.     Breath sounds: Normal breath sounds. No wheezing, rhonchi or rales.     Comments: Mild L anterior chest wall tenderness  No crepitus of skin change  Chest:     Chest wall: Tenderness present.  Abdominal:     General: Bowel sounds are normal. There is no distension or abdominal bruit.     Palpations: Abdomen is soft. There is no mass.     Tenderness: There is no abdominal tenderness.  Musculoskeletal:     Cervical back: Normal range of motion and neck supple. No tenderness.  Lymphadenopathy:     Cervical: No cervical adenopathy.  Skin:    General: Skin is warm and dry.     Coloration: Skin is not pale.     Findings: No erythema or rash.     Comments: R great toe- nail is laterally ingrown with tenderness and mild swelling of adjacent  skin (no fluctuance)  No erythema  Nail is cut farther back on that side   Neurological:     Mental Status: She is alert.     Sensory: No sensory deficit.     Motor: No weakness.     Coordination: Coordination normal.     Deep Tendon Reflexes: Reflexes are normal and symmetric. Reflexes normal.  Psychiatric:        Mood and Affect: Mood normal.        Cognition and Memory: Cognition and memory normal.     Comments: Pt talks candidly about anxiety symptoms when she has them            Assessment & Plan:   Problem List Items Addressed This Visit      Musculoskeletal and Integument   Ingrown nail of great toe of right foot    R great toe-lateral   Painful but does not appear infected  Would like to have procedure  inst to keep clean with soap and water  Do not dig- cut nail straight across   Watch for redness or drainage Ref made to podiatry       Relevant Orders   Ambulatory referral to Podiatry     Genitourinary   Minimal change disease    Continues f/u with nephrology  Is down to 10 mg prednisone-will continue to taper Doing better  Asked her to check in re: getting a covid imm on prednisone        Other   History of smoking 10-25 pack years    Commended on smoking cessation  Doing well!      Atypical chest pain - Primary    Ant/L chest - fleeting and also with longer episodes sometimes at night  Not sob but occ feels need to take a deep breath (? Anxiety) Former smoker  No changes on EKG from last one done  Reassuring  exam-some tenderness over L chest  cxr today  Recommend cardiology ref in the future to see if stress testing is warranted Pt would like to obs first- she thinks it may be rel to prednisone she is weaning   inst to call when ready for ref  Also to call if worse/exertional or no improvement (go to ER if necessary)  Re assuring bp and pulse      Relevant Orders   DG Chest 2 View (Completed)    Other Visit Diagnoses    Chest  discomfort       Relevant Orders   EKG 12-Lead (Completed)

## 2020-04-14 NOTE — Patient Instructions (Addendum)
Our office will call you about the podiatry referral for the toe nail   No changes on your EKG  Your chest discomfort sounds atypical for heart   A cardiologist visit would not be a bad idea - let us know if/when you want to go   Chest xray on the way out   Great job with quitting smoking   Talk to your nephrologist about getting a covid vaccine

## 2020-04-14 NOTE — Assessment & Plan Note (Signed)
Continues f/u with nephrology  Is down to 10 mg prednisone-will continue to taper Doing better  Asked her to check in re: getting a covid imm on prednisone

## 2020-04-14 NOTE — Assessment & Plan Note (Signed)
Ant/L chest - fleeting and also with longer episodes sometimes at night  Not sob but occ feels need to take a deep breath (? Anxiety) Former smoker  No changes on EKG from last one done  Reassuring exam-some tenderness over L chest  cxr today  Recommend cardiology ref in the future to see if stress testing is warranted Pt would like to obs first- she thinks it may be rel to prednisone she is weaning   inst to call when ready for ref  Also to call if worse/exertional or no improvement (go to ER if necessary)  Re assuring bp and pulse

## 2020-04-14 NOTE — Assessment & Plan Note (Signed)
R great toe-lateral   Painful but does not appear infected  Would like to have procedure  inst to keep clean with soap and water  Do not dig- cut nail straight across   Watch for redness or drainage Ref made to podiatry

## 2020-04-15 ENCOUNTER — Ambulatory Visit: Payer: MEDICAID | Admitting: Podiatry

## 2020-04-20 ENCOUNTER — Ambulatory Visit: Payer: Self-pay | Admitting: Podiatry

## 2020-06-16 DIAGNOSIS — R319 Hematuria, unspecified: Secondary | ICD-10-CM | POA: Diagnosis not present

## 2020-06-16 DIAGNOSIS — N050 Unspecified nephritic syndrome with minor glomerular abnormality: Secondary | ICD-10-CM | POA: Diagnosis not present

## 2020-06-16 DIAGNOSIS — R601 Generalized edema: Secondary | ICD-10-CM | POA: Diagnosis not present

## 2020-06-16 DIAGNOSIS — R809 Proteinuria, unspecified: Secondary | ICD-10-CM | POA: Diagnosis not present

## 2020-08-13 ENCOUNTER — Other Ambulatory Visit: Payer: Self-pay

## 2020-08-13 ENCOUNTER — Other Ambulatory Visit (INDEPENDENT_AMBULATORY_CARE_PROVIDER_SITE_OTHER): Payer: Medicaid Other

## 2020-08-13 ENCOUNTER — Other Ambulatory Visit: Payer: Self-pay | Admitting: Family Medicine

## 2020-08-13 DIAGNOSIS — E785 Hyperlipidemia, unspecified: Secondary | ICD-10-CM | POA: Diagnosis not present

## 2020-08-13 LAB — CBC WITH DIFFERENTIAL/PLATELET
Basophils Absolute: 0 10*3/uL (ref 0.0–0.1)
Basophils Relative: 0.4 % (ref 0.0–3.0)
Eosinophils Absolute: 0.2 10*3/uL (ref 0.0–0.7)
Eosinophils Relative: 2.8 % (ref 0.0–5.0)
HCT: 40.5 % (ref 36.0–46.0)
Hemoglobin: 13.8 g/dL (ref 12.0–15.0)
Lymphocytes Relative: 32.5 % (ref 12.0–46.0)
Lymphs Abs: 2.4 10*3/uL (ref 0.7–4.0)
MCHC: 34.2 g/dL (ref 30.0–36.0)
MCV: 92 fl (ref 78.0–100.0)
Monocytes Absolute: 0.5 10*3/uL (ref 0.1–1.0)
Monocytes Relative: 6.9 % (ref 3.0–12.0)
Neutro Abs: 4.3 10*3/uL (ref 1.4–7.7)
Neutrophils Relative %: 57.4 % (ref 43.0–77.0)
Platelets: 199 10*3/uL (ref 150.0–400.0)
RBC: 4.41 Mil/uL (ref 3.87–5.11)
RDW: 13.4 % (ref 11.5–15.5)
WBC: 7.5 10*3/uL (ref 4.0–10.5)

## 2020-08-13 LAB — COMPREHENSIVE METABOLIC PANEL
ALT: 9 U/L (ref 0–35)
AST: 12 U/L (ref 0–37)
Albumin: 4.1 g/dL (ref 3.5–5.2)
Alkaline Phosphatase: 47 U/L (ref 39–117)
BUN: 9 mg/dL (ref 6–23)
CO2: 30 mEq/L (ref 19–32)
Calcium: 8.9 mg/dL (ref 8.4–10.5)
Chloride: 105 mEq/L (ref 96–112)
Creatinine, Ser: 0.8 mg/dL (ref 0.40–1.20)
GFR: 79.73 mL/min (ref >60.00)
Glucose, Bld: 79 mg/dL (ref 70–99)
Potassium: 4.4 mEq/L (ref 3.5–5.1)
Sodium: 139 mEq/L (ref 135–145)
Total Bilirubin: 0.6 mg/dL (ref 0.2–1.2)
Total Protein: 6.2 g/dL (ref 6.0–8.3)

## 2020-08-13 LAB — LIPID PANEL
Cholesterol: 154 mg/dL (ref 0–200)
HDL: 37 mg/dL — ABNORMAL LOW (ref >39.00)
LDL Cholesterol: 103 mg/dL — ABNORMAL HIGH (ref 0–99)
NonHDL: 116.56
Total CHOL/HDL Ratio: 4
Triglycerides: 66 mg/dL (ref 0.0–149.0)
VLDL: 13.2 mg/dL (ref 0.0–40.0)

## 2020-08-13 NOTE — Progress Notes (Signed)
Orders placed.  FYI to PCP.

## 2020-08-18 ENCOUNTER — Encounter: Payer: Self-pay | Admitting: Obstetrics and Gynecology

## 2020-08-18 ENCOUNTER — Ambulatory Visit (INDEPENDENT_AMBULATORY_CARE_PROVIDER_SITE_OTHER): Payer: Medicaid Other | Admitting: Obstetrics and Gynecology

## 2020-08-18 ENCOUNTER — Other Ambulatory Visit (HOSPITAL_COMMUNITY)
Admission: RE | Admit: 2020-08-18 | Discharge: 2020-08-18 | Disposition: A | Discharge status: Home / Self Care | Payer: Medicaid Other | Source: Ambulatory Visit | Attending: Obstetrics and Gynecology | Admitting: Obstetrics and Gynecology

## 2020-08-18 ENCOUNTER — Other Ambulatory Visit: Payer: Self-pay

## 2020-08-18 VITALS — BP 95/66 | HR 67 | Ht 65.5 in | Wt 171.6 lb

## 2020-08-18 DIAGNOSIS — Z01419 Encounter for gynecological examination (general) (routine) without abnormal findings: Secondary | ICD-10-CM | POA: Diagnosis not present

## 2020-08-18 DIAGNOSIS — Z8619 Personal history of other infectious and parasitic diseases: Secondary | ICD-10-CM | POA: Insufficient documentation

## 2020-08-18 DIAGNOSIS — F413 Other mixed anxiety disorders: Secondary | ICD-10-CM | POA: Diagnosis not present

## 2020-08-18 DIAGNOSIS — Z975 Presence of (intrauterine) contraceptive device: Secondary | ICD-10-CM | POA: Diagnosis not present

## 2020-08-18 NOTE — Progress Notes (Signed)
GYNECOLOGY ANNUAL PHYSICAL EXAM PROGRESS NOTE  Subjective:    Christine Rice is a 39 y.o. G51P2012 female who presents for an annual exam.  The patient is sexually active.  The patient wears seatbelts: yes. The patient participates in regular exercise: no. Has the patient ever been transfused or tattooed?: no. The patient reports that there is not domestic violence in her life.   The patient has the following complaints today.  1. Patient reports recent hospitalization for 2 weeks due to kidney failure (attributed to vaping). Doing well otherwise.   Gynecologic History No LMP recorded. (Menstrual status: IUD).  Menarche age: 12 Contraception: IUD History of STI's: Denies Last Pap: 07/25/2019: Results were normal. Had abnormal pap smear in 02/16/2018. Results were: abnormal - NILM bur HR HPV positive (type 16). Did not f/u with colposcopy due to loss of insurance.   OB History  Gravida Para Term Preterm AB Living  3 2 2  0 1 2  SAB TAB Ectopic Multiple Live Births  0 1 0 0 2    # Outcome Date GA Lbr Len/2nd Weight Sex Delivery Anes PTL Lv  3 Term 01/19/12 [redacted]w[redacted]d  6 lb 9.6 oz (2.995 kg) F CS-LTranv EPI  LIV     Name: Christine Rice     Apgar1: 9  Apgar5: 9  2 Term 01/06/05    F CS-Unspec   LIV  1 TAB             Past Medical History:  Diagnosis Date  . Anxiety   . Kidney failure   . Nephrotic syndrome     Past Surgical History:  Procedure Laterality Date  . CESAREAN SECTION  01/06/05  . CESAREAN SECTION  01/19/2012   Procedure: CESAREAN SECTION;  Surgeon: Luz Lex, MD;  Location: [facility];  Service: Gynecology;  Laterality: N/A;  repeat  . HERNIA REPAIR    . INGUINAL HERNIA REPAIR  01/19/2012   Procedure: HERNIA REPAIR INGUINAL ADULT BILATERAL;  Surgeon: Adin Hector, MD;  Location: [facility];  Service: General;  Laterality: Bilateral;  . RENAL BIOPSY, PERCUTANEOUS  01/06/2020      . tummy tuck    . UMBILICAL HERNIA REPAIR  01/19/2012   Procedure: HERNIA REPAIR  UMBILICAL ADULT;  Surgeon: Adin Hector, MD;  Location: [facility];  Service: General;  Laterality: N/A;  . UMBILICAL HERNIA REPAIR  11-19-14    Family History  Problem Relation Age of Onset  . Rashes / Skin problems Mother   . COPD Father   . Breast cancer Neg Hx   . Cancer Neg Hx   . Stroke Neg Hx     Social History   Socioeconomic History  . Marital status: Divorced    Spouse name: Not on file  . Number of children: 2  . Years of education: Not on file  . Highest education level: Not on file  Occupational History  . Occupation: Product manager: [employer]  . Occupation: Catering--part time  Tobacco Use  . Smoking status: Former Smoker    Types: Cigarettes    Quit date: 11/15/2019    Years since quitting: 0.7  . Smokeless tobacco: Never Used  Vaping Use  . Vaping Use: Former  Substance and Sexual Activity  . Alcohol use: Yes    Alcohol/week: 0.0 standard drinks    Comment: Once in a while.  . Drug use: Yes    Types: Marijuana    Comment: occ- marijuana   .  Sexual activity: Yes    Partners: Male    Birth control/protection: I.U.D.  Other Topics Concern  . Not on file  Social History Narrative  . Not on file   Social Determinants of Health   Financial Resource Strain:   . Difficulty of Paying Living Expenses: Not on file  Food Insecurity:   . Worried About Charity fundraiser in the Last Year: Not on file  . Ran Out of Food in the Last Year: Not on file  Transportation Needs:   . Lack of Transportation (Medical): Not on file  . Lack of Transportation (Non-Medical): Not on file  Physical Activity:   . Days of Exercise per Week: Not on file  . Minutes of Exercise per Session: Not on file  Stress:   . Feeling of Stress : Not on file  Social Connections:   . Frequency of Communication with Friends and Family: Not on file  . Frequency of Social Gatherings with Friends and Family: Not on file  . Attends Religious Services: Not on file  .  Active Member of Clubs or Organizations: Not on file  . Attends Archivist Meetings: Not on file  . Marital Status: Not on file  Intimate Partner Violence:   . Fear of Current or Ex-Partner: Not on file  . Emotionally Abused: Not on file  . Physically Abused: Not on file  . Sexually Abused: Not on file    Current Outpatient Medications on File Prior to Visit  Medication Sig Dispense Refill  . ALPRAZolam (XANAX) 0.25 MG tablet TAKE 1 TABLET BY MOUTH DAILY AS NEEDED FOR ANXIETY 30 tablet 0  . levonorgestrel (MIRENA) 20 MCG/24HR IUD 1 each by Intrauterine route once.    Marland Kitchen losartan (COZAAR) 100 MG tablet Take 100 mg by mouth daily.    . Multiple Vitamin (MULTIVITAMIN) tablet Take 1 tablet by mouth daily.    . hydrOXYzine (VISTARIL) 25 MG capsule Take 25 mg by mouth every 6 (six) hours as needed for anxiety. (Patient not taking: Reported on 08/18/2020)    . pantoprazole (PROTONIX) 40 MG tablet Take 1 tablet (40 mg total) by mouth daily. (Patient not taking: Reported on 08/18/2020) 30 tablet 2   No current facility-administered medications on file prior to visit.    Allergies  Allergen Reactions  . Amoxicillin Hives    REACTION: rash  No associated shortness of breath or throat swelling  . Penicillins Hives     Review of Systems Constitutional: negative for chills, fevers and sweats.   Eyes: negative for irritation, redness and visual disturbance Ears, nose, mouth, throat, and face: negative for hearing loss, nasal congestion, snoring and tinnitus Respiratory: negative for asthma, cough, sputum Cardiovascular: negative for chest pain, dyspnea, exertional chest pressure/discomfort, irregular heart beat, palpitations and syncope Gastrointestinal: negative for abdominal pain, change in bowel habits, nausea and vomiting Genitourinary: negative for abnormal menstrual periods, genital lesions, sexual problems and vaginal discharge, dysuria and urinary  incontinence. Integument/breast: negative for breast lump, breast tenderness and nipple discharge Hematologic/lymphatic: negative for bleeding and easy bruising Musculoskeletal:negative for back pain and muscle weakness Neurological: negative for dizziness, headaches, vertigo and weakness Endocrine: negative for diabetic symptoms including polydipsia, polyuria and skin dryness Allergic/Immunologic: negative for hay fever and urticaria        Objective:  Blood pressure 95/66, pulse 67, height 5' 5.5" (1.664 m), weight 171 lb 9.6 oz (77.8 kg). Body mass index is 28.12 kg/m.  General Appearance:    Alert, cooperative, no  distress, appears stated age  Head:    Normocephalic, without obvious abnormality, atraumatic  Eyes:    PERRL, conjunctiva/corneas clear, EOM's intact, both eyes  Ears:    Normal external ear canals, both ears  Nose:   Nares normal, septum midline, mucosa normal, no drainage or sinus tenderness  Throat:   Lips, mucosa, and tongue normal; teeth and gums normal  Neck:   Supple, symmetrical, trachea midline, no adenopathy; thyroid: no enlargement/tenderness/nodules; no carotid bruit or JVD  Back:     Symmetric, no curvature, ROM normal, no CVA tenderness  Lungs:     Clear to auscultation bilaterally, respirations unlabored  Chest Wall:    No tenderness or deformity   Heart:    Regular rate and rhythm, S1 and S2 normal, no murmur, rub or gallop  Breast Exam:    No tenderness, masses, or nipple abnormality  Abdomen:     Soft, non-tender, bowel sounds active all four quadrants, no masses, no organomegaly.  Lower transabdominal incision well healed.   Genitalia:    Pelvic:external genitalia normal, vagina without lesions, discharge, or tenderness, rectovaginal septum  normal. Cervix normal in appearance, no cervical motion tenderness.  IUD threads visible from cervical os, 3 cm in length.  No adnexal masses or tenderness.  Uterus normal size, shape, mobile, regular contours,  nontender.  Rectal:    Normal external sphincter.  No hemorrhoids appreciated. Small rectal skin tag present. Internal exam not done.   Extremities:   Extremities normal, atraumatic, no cyanosis or edema  Pulses:   2+ and symmetric all extremities  Skin:   Skin color, texture, turgor normal, no rashes or lesions  Lymph nodes:   Cervical, supraclavicular, and axillary nodes normal  Neurologic:   CNII-XII intact, normal strength, sensation and reflexes throughout   .  Labs:  Lab Results  Component Value Date   WBC 7.5 08/13/2020   HGB 13.8 08/13/2020   HCT 40.5 08/13/2020   MCV 92.0 08/13/2020   PLT 199.0 08/13/2020    Lab Results  Component Value Date   CREATININE 0.80 08/13/2020   BUN 9 08/13/2020   NA 139 08/13/2020   K 4.4 08/13/2020   CL 105 08/13/2020   CO2 30 08/13/2020    Lab Results  Component Value Date   ALT 9 08/13/2020   AST 12 08/13/2020   ALKPHOS 47 08/13/2020   BILITOT 0.6 08/13/2020    Lab Results  Component Value Date   CHOL 154 08/13/2020   HDL 37.00 (L) 08/13/2020   LDLCALC 103 (H) 08/13/2020   TRIG 66.0 08/13/2020   CHOLHDL 4 08/13/2020    Lab Results  Component Value Date   TSH 0.772 07/26/2019     Assessment:   Routine gynecologic exam HPV positive IUD in place History of anxiety  Plan:    - Blood tests: had labs recently while hospitalized.  Will also be following up with PCP within the next month.  - Breast self exam technique reviewed and patient encouraged to perform self-exam monthly. - Contraception: IUD in place (since 09/2015).  Discussed plans for removal. Will return in 2-3 months for removal and reinsertion.  - Discussed healthy lifestyle modifications. - Pap smear performed again today due to h/o HR HPV + testing in 2018.  If normal, can resume q 3 year screens. - COVID vaccination status: declines. Notes fear of vaccine as it is new, especially with recent hospitalization and kidney failure.   - H/o anxiety, overall  managing well, occasionally uses  Xanax. - Follow up in 1 year.     [facility] - 08/18/20 1418      Pregnancy Intention Screening   Does the patient want to become pregnant in the next year? No    Does the patient's partner want to become pregnant in the next year? No    Would the patient like to discuss contraceptive options today? No      Contraception Wrap Up   Current Method IUD or IUS            [facility] - 08/18/20 1418      Pregnancy Intention Screening   Does the patient want to become pregnant in the next year? No    Does the patient's partner want to become pregnant in the next year? No    Would the patient like to discuss contraceptive options today? No      Contraception Wrap Up   Current Method IUD or IUS          The pregnancy intention screening data noted above was reviewed. Potential methods of contraception were discussed. The patient elected to proceed with IUD or IUS.     Rubie Maid, MD [facility]

## 2020-08-18 NOTE — Addendum Note (Signed)
Addended by: Edwyna Shell on: 08/18/2020 03:05 PM   Modules accepted: Orders

## 2020-08-18 NOTE — Patient Instructions (Addendum)
Preventive Care 21-39 Years Old, Female Preventive care refers to visits with your health care provider and lifestyle choices that can promote health and wellness. This includes:  A yearly physical exam. This may also be called an annual well check.  Regular dental visits and eye exams.  Immunizations.  Screening for certain conditions.  Healthy lifestyle choices, such as eating a healthy diet, getting regular exercise, not using drugs or products that contain nicotine and tobacco, and limiting alcohol use. What can I expect for my preventive care visit? Physical exam Your health care provider will check your:  Height and weight. This may be used to calculate body mass index (BMI), which tells if you are at a healthy weight.  Heart rate and blood pressure.  Skin for abnormal spots. Counseling Your health care provider may ask you questions about your:  Alcohol, tobacco, and drug use.  Emotional well-being.  Home and relationship well-being.  Sexual activity.  Eating habits.  Work and work environment.  Method of birth control.  Menstrual cycle.  Pregnancy history. What immunizations do I need?  Influenza (flu) vaccine  This is recommended every year. Tetanus, diphtheria, and pertussis (Tdap) vaccine  You may need a Td booster every 10 years. Varicella (chickenpox) vaccine  You may need this if you have not been vaccinated. Human papillomavirus (HPV) vaccine  If recommended by your health care provider, you may need three doses over 6 months. Measles, mumps, and rubella (MMR) vaccine  You may need at least one dose of MMR. You may also need a second dose. Meningococcal conjugate (MenACWY) vaccine  One dose is recommended if you are age 19-21 years and a first-year college student living in a residence hall, or if you have one of several medical conditions. You may also need additional booster doses. Pneumococcal conjugate (PCV13) vaccine  You may need  this if you have certain conditions and were not previously vaccinated. Pneumococcal polysaccharide (PPSV23) vaccine  You may need one or two doses if you smoke cigarettes or if you have certain conditions. Hepatitis A vaccine  You may need this if you have certain conditions or if you travel or work in places where you may be exposed to hepatitis A. Hepatitis B vaccine  You may need this if you have certain conditions or if you travel or work in places where you may be exposed to hepatitis B. Haemophilus influenzae type b (Hib) vaccine  You may need this if you have certain conditions. You may receive vaccines as individual doses or as more than one vaccine together in one shot (combination vaccines). Talk with your health care provider about the risks and benefits of combination vaccines. What tests do I need?  Blood tests  Lipid and cholesterol levels. These may be checked every 5 years starting at age 20.  Hepatitis C test.  Hepatitis B test. Screening  Diabetes screening. This is done by checking your blood sugar (glucose) after you have not eaten for a while (fasting).  Sexually transmitted disease (STD) testing.  BRCA-related cancer screening. This may be done if you have a family history of breast, ovarian, tubal, or peritoneal cancers.  Pelvic exam and Pap test. This may be done every 3 years starting at age 21. Starting at age 30, this may be done every 5 years if you have a Pap test in combination with an HPV test. Talk with your health care provider about your test results, treatment options, and if necessary, the need for more tests.   Follow these instructions at home: Eating and drinking   Eat a diet that includes fresh fruits and vegetables, whole grains, lean protein, and low-fat dairy.  Take vitamin and mineral supplements as recommended by your health care provider.  Do not drink alcohol if: ? Your health care provider tells you not to drink. ? You are  pregnant, may be pregnant, or are planning to become pregnant.  If you drink alcohol: ? Limit how much you have to 0-1 drink a day. ? Be aware of how much alcohol is in your drink. In the [place] one drink equals one 12 oz bottle of beer (355 mL), one 5 oz glass of wine (148 mL), or one 1 oz glass of hard liquor (44 mL). Lifestyle  Take daily care of your teeth and gums.  Stay active. Exercise for at least 30 minutes on 5 or more days each week.  Do not use any products that contain nicotine or tobacco, such as cigarettes, e-cigarettes, and chewing tobacco. If you need help quitting, ask your health care provider.  If you are sexually active, practice safe sex. Use a condom or other form of birth control (contraception) in order to prevent pregnancy and STIs (sexually transmitted infections). If you plan to become pregnant, see your health care provider for a preconception visit. What's next?  Visit your health care provider once a year for a well check visit.  Ask your health care provider how often you should have your eyes and teeth checked.  Stay up to date on all vaccines. This information is not intended to replace advice given to you by your health care provider. Make sure you discuss any questions you have with your health care provider. Document Revised: 08/09/2018 Document Reviewed: 08/09/2018 Elsevier Patient Education  2020 [employer] Preventive Care 47-11 Years Old, Female Preventive care refers to visits with your health care provider and lifestyle choices that can promote health and wellness. This includes:  A yearly physical exam. This may also be called an annual well check.  Regular dental visits and eye exams.  Immunizations.  Screening for certain conditions.  Healthy lifestyle choices, such as eating a healthy diet, getting regular exercise, not using drugs or products that contain nicotine and tobacco, and limiting alcohol use. What can I expect for my  preventive care visit? Physical exam Your health care provider will check your:  Height and weight. This may be used to calculate body mass index (BMI), which tells if you are at a healthy weight.  Heart rate and blood pressure.  Skin for abnormal spots. Counseling Your health care provider may ask you questions about your:  Alcohol, tobacco, and drug use.  Emotional well-being.  Home and relationship well-being.  Sexual activity.  Eating habits.  Work and work Statistician.  Method of birth control.  Menstrual cycle.  Pregnancy history. What immunizations do I need?  Influenza (flu) vaccine  This is recommended every year. Tetanus, diphtheria, and pertussis (Tdap) vaccine  You may need a Td booster every 10 years. Varicella (chickenpox) vaccine  You may need this if you have not been vaccinated. Zoster (shingles) vaccine  You may need this after age 25. Measles, mumps, and rubella (MMR) vaccine  You may need at least one dose of MMR if you were born in 1957 or later. You may also need a second dose. Pneumococcal conjugate (PCV13) vaccine  You may need this if you have certain conditions and were not previously vaccinated. Pneumococcal polysaccharide (PPSV23) vaccine  You may need one or two doses if you smoke cigarettes or if you have certain conditions. Meningococcal conjugate (MenACWY) vaccine  You may need this if you have certain conditions. Hepatitis A vaccine  You may need this if you have certain conditions or if you travel or work in places where you may be exposed to hepatitis A. Hepatitis B vaccine  You may need this if you have certain conditions or if you travel or work in places where you may be exposed to hepatitis B. Haemophilus influenzae type b (Hib) vaccine  You may need this if you have certain conditions. Human papillomavirus (HPV) vaccine  If recommended by your health care provider, you may need three doses over 6 months. You  may receive vaccines as individual doses or as more than one vaccine together in one shot (combination vaccines). Talk with your health care provider about the risks and benefits of combination vaccines. What tests do I need? Blood tests  Lipid and cholesterol levels. These may be checked every 5 years, or more frequently if you are over 59 years old.  Hepatitis C test.  Hepatitis B test. Screening  Lung cancer screening. You may have this screening every year starting at age 25 if you have a 30-pack-year history of smoking and currently smoke or have quit within the past 15 years.  Colorectal cancer screening. All adults should have this screening starting at age 22 and continuing until age 28. Your health care provider may recommend screening at age 59 if you are at increased risk. You will have tests every 1-10 years, depending on your results and the type of screening test.  Diabetes screening. This is done by checking your blood sugar (glucose) after you have not eaten for a while (fasting). You may have this done every 1-3 years.  Mammogram. This may be done every 1-2 years. Talk with your health care provider about when you should start having regular mammograms. This may depend on whether you have a family history of breast cancer.  BRCA-related cancer screening. This may be done if you have a family history of breast, ovarian, tubal, or peritoneal cancers.  Pelvic exam and Pap test. This may be done every 3 years starting at age 73. Starting at age 37, this may be done every 5 years if you have a Pap test in combination with an HPV test. Other tests  Sexually transmitted disease (STD) testing.  Bone density scan. This is done to screen for osteoporosis. You may have this scan if you are at high risk for osteoporosis. Follow these instructions at home: Eating and drinking  Eat a diet that includes fresh fruits and vegetables, whole grains, lean protein, and low-fat  dairy.  Take vitamin and mineral supplements as recommended by your health care provider.  Do not drink alcohol if: ? Your health care provider tells you not to drink. ? You are pregnant, may be pregnant, or are planning to become pregnant.  If you drink alcohol: ? Limit how much you have to 0-1 drink a day. ? Be aware of how much alcohol is in your drink. In the [place] one drink equals one 12 oz bottle of beer (355 mL), one 5 oz glass of wine (148 mL), or one 1 oz glass of hard liquor (44 mL). Lifestyle  Take daily care of your teeth and gums.  Stay active. Exercise for at least 30 minutes on 5 or more days each week.  Do not use any products  that contain nicotine or tobacco, such as cigarettes, e-cigarettes, and chewing tobacco. If you need help quitting, ask your health care provider.  If you are sexually active, practice safe sex. Use a condom or other form of birth control (contraception) in order to prevent pregnancy and STIs (sexually transmitted infections).  If told by your health care provider, take low-dose aspirin daily starting at age 63. What's next?  Visit your health care provider once a year for a well check visit.  Ask your health care provider how often you should have your eyes and teeth checked.  Stay up to date on all vaccines. This information is not intended to replace advice given to you by your health care provider. Make sure you discuss any questions you have with your health care provider. Document Revised: 08/09/2018 Document Reviewed: 08/09/2018 Elsevier Patient Education  2020 [place] Breast self-awareness is knowing how your breasts look and feel. Doing breast self-awareness is important. It allows you to catch a breast problem early while it is still small and can be treated. All women should do breast self-awareness, including women who have had breast implants. Tell your doctor if you notice a change in your  breasts. What you need:  A mirror.  A well-lit room. How to do a breast self-exam A breast self-exam is one way to learn what is normal for your breasts and to check for changes. To do a breast self-exam: Look for changes  1. Take off all the clothes above your waist. 2. Stand in front of a mirror in a room with good lighting. 3. Put your hands on your hips. 4. Push your hands down. 5. Look at your breasts and nipples in the mirror to see if one breast or nipple looks different from the other. Check to see if: ? The shape of one breast is different. ? The size of one breast is different. ? There are wrinkles, dips, and bumps in one breast and not the other. 6. Look at each breast for changes in the skin, such as: ? Redness. ? Scaly areas. 7. Look for changes in your nipples, such as: ? Liquid around the nipples. ? Bleeding. ? Dimpling. ? Redness. ? A change in where the nipples are. Feel for changes  1. Lie on your back on the floor. 2. Feel each breast. To do this, follow these steps: ? Pick a breast to feel. ? Put the arm closest to that breast above your head. ? Use your other arm to feel the nipple area of your breast. Feel the area with the pads of your three middle fingers by making small circles with your fingers. For the first circle, press lightly. For the second circle, press harder. For the third circle, press even harder. ? Keep making circles with your fingers at the different pressures as you move down your breast. Stop when you feel your ribs. ? Move your fingers a little toward the center of your body. ? Start making circles with your fingers again, this time going up until you reach your collarbone. ? Keep making up-and-down circles until you reach your armpit. Remember to keep using the three pressures. ? Feel the other breast in the same way. 3. Sit or stand in the tub or shower. 4. With soapy water on your skin, feel each breast the same way you did in step  2 when you were lying on the floor. Write down what you find Writing down  what you find can help you remember what to tell your doctor. Write down:  What is normal for each breast.  Any changes you find in each breast, including: ? The kind of changes you find. ? Whether you have pain. ? Size and location of any lumps.  When you last had your menstrual period. General tips  Check your breasts every month.  If you are breastfeeding, the best time to check your breasts is after you feed your baby or after you use a breast pump.  If you get menstrual periods, the best time to check your breasts is 5-7 days after your menstrual period is over.  With time, you will become comfortable with the self-exam, and you will begin to know if there are changes in your breasts. Contact a doctor if you:  See a change in the shape or size of your breasts or nipples.  See a change in the skin of your breast or nipples, such as red or scaly skin.  Have fluid coming from your nipples that is not normal.  Find a lump or thick area that was not there before.  Have pain in your breasts.  Have any concerns about your breast health. Summary  Breast self-awareness includes looking for changes in your breasts, as well as feeling for changes within your breasts.  Breast self-awareness should be done in front of a mirror in a well-lit room.  You should check your breasts every month. If you get menstrual periods, the best time to check your breasts is 5-7 days after your menstrual period is over.  Let your doctor know of any changes you see in your breasts, including changes in size, changes on the skin, pain or tenderness, or fluid from your nipples that is not normal. This information is not intended to replace advice given to you by your health care provider. Make sure you discuss any questions you have with your health care provider. Document Revised: 07/17/2018 Document Reviewed:  07/17/2018 Elsevier Patient Education  [address] [address].

## 2020-08-18 NOTE — Progress Notes (Signed)
Pt present for annual exam. Pt stated that she was doing well no problems.  

## 2020-08-20 ENCOUNTER — Other Ambulatory Visit: Payer: Self-pay

## 2020-08-20 ENCOUNTER — Ambulatory Visit (INDEPENDENT_AMBULATORY_CARE_PROVIDER_SITE_OTHER): Payer: Medicaid Other | Admitting: Family Medicine

## 2020-08-20 ENCOUNTER — Encounter: Payer: Self-pay | Admitting: Family Medicine

## 2020-08-20 VITALS — BP 112/70 | HR 76 | Temp 97.1°F | Ht 65.75 in | Wt 170.4 lb

## 2020-08-20 DIAGNOSIS — N050 Unspecified nephritic syndrome with minor glomerular abnormality: Secondary | ICD-10-CM | POA: Diagnosis not present

## 2020-08-20 DIAGNOSIS — I160 Hypertensive urgency: Secondary | ICD-10-CM | POA: Diagnosis not present

## 2020-08-20 DIAGNOSIS — F413 Other mixed anxiety disorders: Secondary | ICD-10-CM

## 2020-08-20 DIAGNOSIS — Z0000 Encounter for general adult medical examination without abnormal findings: Secondary | ICD-10-CM | POA: Diagnosis not present

## 2020-08-20 NOTE — Progress Notes (Signed)
Subjective:    Patient ID: Christine Rice, female    DOB: [birth year], 39 y.o.   MRN: [id number]  This visit occurred during the SARS-CoV-2 public health emergency.  Safety protocols were in place, including screening questions prior to the visit, additional usage of staff PPE, and extensive cleaning of exam room while observing appropriate contact time as indicated for disinfecting solutions.    HPI Here for health maintenance exam and to review chronic medical problems    Wt Readings from Last 3 Encounters:  08/20/20 170 lb 6 oz (77.3 kg)  08/18/20 171 lb 9.6 oz (77.8 kg)  04/14/20 171 lb 9 oz (77.8 kg)   27.71 kg/m   Has been feeling fair   Found out her chest pain was from lipitor (from kidney doctor)  She is off of it now  Still achey  Waiting for it to get out of her system   Has never had an exercise regimen but very active lifestyle - outdoor activities and hiking/paddleboard and kayak   Is too fatigued lately  Sees renal on 10/6   Has medicare now for a year due to her renal issues     Flu vaccine - declines  covid status - declines (afraid of it)     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  Younger child is in school  Tdap 8/15 pna 23 8/15  Pap 8/20 -neg with neg HPV (Dr Marcelline Mates)  Remote h/o HPV type 16  Had most recent gyn appt 9/7 and pap was done-pending  Menses-has IUD-plans for replacement in 2-3 months  No menses at all (she does feel hormone changes)   Oct 5th getting wisdom teeth out   Hep C screen neg 8/20 HIV neg screen 1/21   No STD screening needed /in a monogamous relationship  No gyn problems   Self breast exam -no lumps /had exam yesterday  Turns 56 in may -will discuss mammogram with gyn at next yearly visit   H/o hypertensive urgency assoc with nephrotic syndrome (minimal change dz)  Taking losartan 100 mg daily  BP Readings from Last 3 Encounters:  08/20/20 112/70  08/18/20 95/66  04/14/20 118/74   Pulse Readings from Last 3  Encounters:  08/20/20 76  08/18/20 67  04/14/20 75   Lab Results  Component Value Date   CREATININE 0.80 08/13/2020   BUN 9 08/13/2020   NA 139 08/13/2020   K 4.4 08/13/2020   CL 105 08/13/2020   CO2 30 08/13/2020   Glucose is 79  Lab Results  Component Value Date   ALT 9 08/13/2020   AST 12 08/13/2020   ALKPHOS 47 08/13/2020   BILITOT 0.6 08/13/2020   total protein and albumin nl  GFR of 79.7  Anxiety disorder Xanax prn   Lab Results  Component Value Date   WBC 7.5 08/13/2020   HGB 13.8 08/13/2020   HCT 40.5 08/13/2020   MCV 92.0 08/13/2020   PLT 199.0 08/13/2020   Lab Results  Component Value Date   TSH 0.772 07/26/2019    Cholesterol Lab Results  Component Value Date   CHOL 154 08/13/2020   CHOL 349 (H) 01/16/2020   CHOL 166 07/26/2019   Lab Results  Component Value Date   HDL 37.00 (L) 08/13/2020   HDL 49 01/16/2020   HDL 46 07/26/2019   Lab Results  Component Value Date   LDLCALC 103 (H) 08/13/2020   LDLCALC 261 (H) 01/16/2020   LDLCALC 108 (H) 07/26/2019  Lab Results  Component Value Date   TRIG 66.0 08/13/2020   TRIG 194 (H) 01/16/2020   TRIG 60 07/26/2019   Lab Results  Component Value Date   CHOLHDL 4 08/13/2020   CHOLHDL 7.1 01/16/2020   CHOLHDL 3.6 07/26/2019   No results found for: LDLDIRECT  Last lipitor dose was a month ago  Cholesterol is much better (no longer on it)  No smoking or tobacco products at all   Patient Active Problem List   Diagnosis Date Noted  . Ingrown nail of great toe of right foot 04/14/2020  . 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 Hypertensive urgency 01/10/2020  . Nephrotic syndrome, minimal change 01/10/2020  . Skin lesion 06/10/2019  . H/O hernia repair 06/10/2019  . History of smoking 10-25 pack years 07/14/2014  . Routine general medical examination at a health care facility 07/07/2014  . Cesarean delivery delivered 01/19/2012  . IBS 08/15/2007  . 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  Anxiety disorder 08/14/2007  . DEPRESSION 08/14/2007  . ADD 08/14/2007   Past Medical History:  Diagnosis Date  . Anxiety   . Kidney failure   . Nephrotic syndrome    Past Surgical History:  Procedure Laterality Date  . CESAREAN SECTION  01/06/05  . CESAREAN SECTION  01/19/2012   Procedure: CESAREAN SECTION;  Surgeon: Luz Lex, MD;  Location: [facility];  Service: Gynecology;  Laterality: N/A;  repeat  . HERNIA REPAIR    . INGUINAL HERNIA REPAIR  01/19/2012   Procedure: HERNIA REPAIR INGUINAL ADULT BILATERAL;  Surgeon: Adin Hector, MD;  Location: [facility];  Service: General;  Laterality: Bilateral;  . RENAL BIOPSY, PERCUTANEOUS  01/06/2020      . tummy tuck    . UMBILICAL HERNIA REPAIR  01/19/2012   Procedure: HERNIA REPAIR UMBILICAL ADULT;  Surgeon: Adin Hector, MD;  Location: [facility];  Service: General;  Laterality: N/A;  . UMBILICAL HERNIA REPAIR  11-19-14   Social History   Tobacco Use  . Smoking status: Former Smoker    Types: Cigarettes    Quit date: 11/15/2019    Years since quitting: 0.7  . Smokeless tobacco: Never Used  Vaping Use  . Vaping Use: Former  Substance Use Topics  . Alcohol use: Yes    Alcohol/week: 0.0 standard drinks    Comment: Once in a while.  . Drug use: Yes    Types: Marijuana    Comment: occ- marijuana    Family History  Problem Relation Age of Onset  . Rashes / Skin problems Mother   . COPD Father   . Breast cancer Neg Hx   . Cancer Neg Hx   . Stroke Neg Hx    Allergies  Allergen Reactions  . Amoxicillin Hives    REACTION: rash  No associated shortness of breath or throat swelling  . Penicillins Hives   Current Outpatient Medications on File Prior to Visit  Medication Sig Dispense Refill  . ALPRAZolam (XANAX) 0.25 MG tablet TAKE 1 TABLET BY MOUTH DAILY AS NEEDED FOR ANXIETY 30 tablet 0  . levonorgestrel (MIRENA) 20 MCG/24HR IUD 1 each by Intrauterine route once.    Marland Kitchen losartan (COZAAR) 100 MG tablet Take 100 mg by mouth daily.      . Multiple Vitamin (MULTIVITAMIN) tablet Take 1 tablet by mouth daily.     No current facility-administered medications on file prior to visit.    Review of Systems  Constitutional: Positive for fatigue. Negative for activity  change, appetite change, fever and unexpected weight change.  HENT: Negative for congestion, ear pain, rhinorrhea, sinus pressure and sore throat.   Eyes: Negative for pain, redness and visual disturbance.  Respiratory: Negative for cough, shortness of breath and wheezing.   Cardiovascular: Negative for chest pain and palpitations.  Gastrointestinal: Negative for abdominal pain, blood in stool, constipation and diarrhea.  Endocrine: Negative for polydipsia and polyuria.  Genitourinary: Negative for dysuria, frequency and urgency.  Musculoskeletal: Negative for arthralgias, back pain and myalgias.       A lot of aches and pains since taking lipitor  Skin: Negative for pallor and rash.  Allergic/Immunologic: Negative for environmental allergies.  Neurological: Negative for dizziness, syncope and headaches.  Hematological: Negative for adenopathy. Does not bruise/bleed easily.  Psychiatric/Behavioral: Negative for decreased concentration and dysphoric mood. The patient is not nervous/anxious.        Objective:   Physical Exam Constitutional:      General: She is not in acute distress.    Appearance: Normal appearance. She is well-developed and normal weight. She is not ill-appearing or diaphoretic.  HENT:     Head: Normocephalic and atraumatic.     Right Ear: Tympanic membrane, ear canal and external ear normal.     Left Ear: Tympanic membrane, ear canal and external ear normal.     Nose: Nose normal. No congestion.     Mouth/Throat:     Mouth: Mucous membranes are moist.     Pharynx: Oropharynx is clear. No posterior oropharyngeal erythema.  Eyes:     General: No scleral icterus.    Extraocular Movements: Extraocular movements intact.      Conjunctiva/sclera: Conjunctivae normal.     Pupils: Pupils are equal, round, and reactive to light.  Neck:     Thyroid: No thyromegaly.     Vascular: No carotid bruit or JVD.  Cardiovascular:     Rate and Rhythm: Normal rate and regular rhythm.     Pulses: Normal pulses.     Heart sounds: Normal heart sounds. No gallop.   Pulmonary:     Effort: Pulmonary effort is normal. No respiratory distress.     Breath sounds: Normal breath sounds. No wheezing.     Comments: Good air exch Chest:     Chest wall: No tenderness.  Abdominal:     General: Bowel sounds are normal. There is no distension or abdominal bruit.     Palpations: Abdomen is soft. There is no mass.     Tenderness: There is no abdominal tenderness.     Hernia: No hernia is present.  Genitourinary:    Comments: Breast and pelvic exam done by gyn  Musculoskeletal:        General: No tenderness. Normal range of motion.     Cervical back: Normal range of motion and neck supple. No rigidity. No muscular tenderness.     Right lower leg: No edema.     Left lower leg: No edema.  Lymphadenopathy:     Cervical: No cervical adenopathy.  Skin:    General: Skin is warm and dry.     Coloration: Skin is not pale.     Findings: No erythema or rash.     Comments: Tanned Solar lentigines diffusely   Neurological:     Mental Status: She is alert. Mental status is at baseline.     Cranial Nerves: No cranial nerve deficit.     Motor: No abnormal muscle tone.     Coordination: Coordination normal.  Gait: Gait normal.     Deep Tendon Reflexes: Reflexes are normal and symmetric. Reflexes normal.  Psychiatric:        Mood and Affect: Mood normal.        Cognition and Memory: Cognition and memory normal.           Assessment & Plan:   Problem List Items Addressed This Visit      Cardiovascular and Mediastinum   Hypertensive urgency    BP is well controlled with losartan now  Seeing nephrology for minimal change disease         Genitourinary   Minimal change disease    Nephrotic syndrome is improved No longer on prednisone  Continues f/u with nephrology  Taking losartan with good blood pressure          Other   Anxiety disorder    Pt takes xanax minimally (needed more when she was on prednisone)  Declines ssri/other medication or need for counseling      Routine general medical examination at a health care facility - Primary    Reviewed health habits including diet and exercise and skin cancer prevention Reviewed appropriate screening tests for age  Also reviewed health mt list, fam hx and immunization status , as well as social and family history   See HPI Labs reviewed Gyn visit reviewed and pap report is pending Cholesterol is much improved Still fatigued s/p kidney issues  Declines flu vaccine and covid vaccine- questions answered  Declines STD screening

## 2020-08-20 NOTE — Assessment & Plan Note (Signed)
Reviewed health habits including diet and exercise and skin cancer prevention Reviewed appropriate screening tests for age  Also reviewed health mt list, fam hx and immunization status , as well as social and family history   See HPI Labs reviewed Gyn visit reviewed and pap report is pending Cholesterol is much improved Still fatigued s/p kidney issues  Declines flu vaccine and covid vaccine- questions answered  Declines STD screening

## 2020-08-20 NOTE — Patient Instructions (Addendum)
Eating fish or taking omega 3 supplement will help HDL along with exercise   Otherwise cholesterol looks better   Wear sunscreen to prevent burning and skin cancer and wrinkles   Labs look pretty good   Continue follow up with gyn and nephrology   Add a little exercise when you feel up to it   Let us know if you change your mind about a flu shot  I highly recommend covid vaccines

## 2020-08-20 NOTE — Assessment & Plan Note (Signed)
Nephrotic syndrome is improved No longer on prednisone  Continues f/u with nephrology  Taking losartan with good blood pressure

## 2020-08-20 NOTE — Assessment & Plan Note (Signed)
Pt takes xanax minimally (needed more when she was on prednisone)  Declines ssri/other medication or need for counseling

## 2020-08-20 NOTE — Assessment & Plan Note (Signed)
BP is well controlled with losartan now  Seeing nephrology for minimal change disease

## 2020-08-21 ENCOUNTER — Encounter: Payer: Self-pay | Admitting: Family Medicine

## 2020-08-23 MED ORDER — ALPRAZOLAM 0.25 MG PO TABS
0.2500 mg | ORAL_TABLET | Freq: Every day | ORAL | 0 refills | Status: DC | PRN
Start: 2020-08-23 — End: 2021-09-14

## 2020-08-24 LAB — CYTOLOGY - PAP
Comment: NEGATIVE
Diagnosis: NEGATIVE
High risk HPV: NEGATIVE

## 2020-09-16 DIAGNOSIS — R809 Proteinuria, unspecified: Secondary | ICD-10-CM | POA: Diagnosis not present

## 2020-09-16 DIAGNOSIS — R319 Hematuria, unspecified: Secondary | ICD-10-CM | POA: Diagnosis not present

## 2020-09-16 DIAGNOSIS — R601 Generalized edema: Secondary | ICD-10-CM | POA: Diagnosis not present

## 2020-09-16 DIAGNOSIS — N050 Unspecified nephritic syndrome with minor glomerular abnormality: Secondary | ICD-10-CM | POA: Diagnosis not present

## 2020-10-07 ENCOUNTER — Ambulatory Visit: Payer: Medicaid Other | Admitting: Family Medicine

## 2020-10-07 ENCOUNTER — Telehealth: Payer: Self-pay

## 2020-10-07 NOTE — Telephone Encounter (Signed)
Elevate and ice as much as possible  I will see her then

## 2020-10-07 NOTE — Telephone Encounter (Signed)
Pt called back and cancelled appt. 

## 2020-10-07 NOTE — Telephone Encounter (Signed)
Pt said 10/06/20 pt was pushing daughter on swing and pt felt something pop in rt calf; pt said if sitting no pain but if weight bearing on and off has sharp pain in rt calf with pain level of 8 when hurting. Pt has been trying to rest and use heat on area; no swelling, redness, bruising or knots noticed on rt calf; no CP or SOB; offered pt appt for this afternoon but pt has to work and pt scheduled appt with Dr Glori Bickers on 10/08/20 at 12 noon. UC & ED precautions given and pt voiced understanding.

## 2020-10-08 ENCOUNTER — Ambulatory Visit: Payer: Medicaid Other | Admitting: Family Medicine

## 2020-10-21 ENCOUNTER — Other Ambulatory Visit: Payer: Self-pay

## 2020-10-21 ENCOUNTER — Encounter: Payer: Self-pay | Admitting: Obstetrics and Gynecology

## 2020-10-21 ENCOUNTER — Ambulatory Visit (INDEPENDENT_AMBULATORY_CARE_PROVIDER_SITE_OTHER): Payer: Medicaid Other | Admitting: Obstetrics and Gynecology

## 2020-10-21 VITALS — BP 100/67 | HR 67 | Ht 65.75 in | Wt 162.4 lb

## 2020-10-21 DIAGNOSIS — R601 Generalized edema: Secondary | ICD-10-CM | POA: Diagnosis not present

## 2020-10-21 DIAGNOSIS — Z30433 Encounter for removal and reinsertion of intrauterine contraceptive device: Secondary | ICD-10-CM | POA: Diagnosis not present

## 2020-10-21 DIAGNOSIS — R809 Proteinuria, unspecified: Secondary | ICD-10-CM | POA: Diagnosis not present

## 2020-10-21 DIAGNOSIS — R319 Hematuria, unspecified: Secondary | ICD-10-CM | POA: Diagnosis not present

## 2020-10-21 DIAGNOSIS — N050 Unspecified nephritic syndrome with minor glomerular abnormality: Secondary | ICD-10-CM | POA: Diagnosis not present

## 2020-10-21 NOTE — Progress Notes (Signed)
     GYNECOLOGY OFFICE PROCEDURE NOTE  Christine Rice is a 39 y.o. [id number] here for Mirena IUD removal and reinsertion. No GYN concerns.  Last pap smear was on 08/18/2020 and was normal.  IUD Removal and Reinsertion  Patient identified, informed consent performed, consent signed.   Discussed risks of irregular bleeding, cramping, infection, malpositioning or misplacement of the IUD outside the uterus which may require further procedures. Also discussed >99% contraception efficacy, increased risk of ectopic pregnancy with failure of method.   Emphasized that this did not protect against STIs, condoms recommended during all sexual encounters.Advised to use backup contraception for one week as the risk of pregnancy is higher during the transition period of removing an IUD and replacing it with another one. Time out was performed. Speculum placed in the vagina. The strings of the IUD were grasped and pulled using ring forceps. The IUD was successfully removed in its entirety. The cervix was cleaned with Betadine x 2 and grasped anteriorly with a single tooth tenaculum.  The new Mirena IUD insertion apparatus was used to sound the uterus to 7 cm;  the IUD was then placed per manufacturer's recommendations. Strings trimmed to 3 cm. Tenaculum was removed, good hemostasis noted. Patient tolerated procedure well.   Patient was given post-procedure instructions.  She was reminded to have backup contraception for one week during this transition period between IUDs.  Patient was also asked to check IUD strings periodically and follow up in 4 weeks for IUD check.   Lot: [id number] Exp: 09/2022   Rubie Maid, MD [facility]

## 2020-10-21 NOTE — Progress Notes (Signed)
Pt present for IUD removal and reinsertion. Pt stated that she was doing well no problems.

## 2020-10-21 NOTE — Patient Instructions (Signed)
Intrauterine Device Insertion, Care After  This sheet gives you information about how to care for yourself after your procedure. Your health care provider may also give you more specific instructions. If you have problems or questions, contact your health care provider. What can I expect after the procedure? After the procedure, it is common to have:  Cramps and pain in the abdomen.  Light bleeding (spotting) or heavier bleeding that is like your menstrual period. This may last for up to a few days.  Lower back pain.  Dizziness.  Headaches.  Nausea. Follow these instructions at home:  Before resuming sexual activity, check to make sure that you can feel the IUD string(s). You should be able to feel the end of the string(s) below the opening of your cervix. If your IUD string is in place, you may resume sexual activity. ? If you had a hormonal IUD inserted more than 7 days after your most recent period started, you will need to use a backup method of birth control for 7 days after IUD insertion. Ask your health care provider whether this applies to you.  Continue to check that the IUD is still in place by feeling for the string(s) after every menstrual period, or once a month.  Take over-the-counter and prescription medicines only as told by your health care provider.  Do not drive or use heavy machinery while taking prescription pain medicine.  Keep all follow-up visits as told by your health care provider. This is important. Contact a health care provider if:  You have bleeding that is heavier or lasts longer than a normal menstrual cycle.  You have a fever.  You have cramps or abdominal pain that get worse or do not get better with medicine.  You develop abdominal pain that is new or is not in the same area of earlier cramping and pain.  You feel lightheaded or weak.  You have abnormal or bad-smelling discharge from your vagina.  You have pain during sexual  activity.  You have any of the following problems with your IUD string(s): ? The string bothers or hurts you or your sexual partner. ? You cannot feel the string. ? The string has gotten longer.  You can feel the IUD in your vagina.  You think you may be pregnant, or you miss your menstrual period.  You think you may have an STI (sexually transmitted infection). Get help right away if:  You have flu-like symptoms.  You have a fever and chills.  You can feel that your IUD has slipped out of place. Summary  After the procedure, it is common to have cramps and pain in the abdomen. It is also common to have light bleeding (spotting) or heavier bleeding that is like your menstrual period.  Continue to check that the IUD is still in place by feeling for the string(s) after every menstrual period, or once a month.  Keep all follow-up visits as told by your health care provider. This is important.  Contact your health care provider if you have problems with your IUD string(s), such as the string getting longer or bothering you or your sexual partner. This information is not intended to replace advice given to you by your health care provider. Make sure you discuss any questions you have with your health care provider. Document Revised: 11/10/2017 Document Reviewed: 10/19/2016 Elsevier Patient Education  2020 [employer]  

## 2020-10-28 ENCOUNTER — Other Ambulatory Visit: Payer: Self-pay | Admitting: Nephrology

## 2020-10-28 DIAGNOSIS — N050 Unspecified nephritic syndrome with minor glomerular abnormality: Secondary | ICD-10-CM | POA: Diagnosis not present

## 2020-10-28 DIAGNOSIS — R809 Proteinuria, unspecified: Secondary | ICD-10-CM | POA: Diagnosis not present

## 2020-10-28 DIAGNOSIS — R319 Hematuria, unspecified: Secondary | ICD-10-CM | POA: Diagnosis not present

## 2020-10-28 DIAGNOSIS — R601 Generalized edema: Secondary | ICD-10-CM | POA: Diagnosis not present

## 2020-11-17 ENCOUNTER — Ambulatory Visit
Admission: EM | Admit: 2020-11-17 | Discharge: 2020-11-17 | Disposition: A | Discharge status: Home / Self Care | Payer: Medicaid Other | Attending: Family Medicine | Admitting: Family Medicine

## 2020-11-17 ENCOUNTER — Encounter: Payer: Self-pay | Admitting: Emergency Medicine

## 2020-11-17 ENCOUNTER — Other Ambulatory Visit: Payer: Self-pay

## 2020-11-17 DIAGNOSIS — R079 Chest pain, unspecified: Secondary | ICD-10-CM | POA: Diagnosis not present

## 2020-11-17 NOTE — Discharge Instructions (Addendum)
EKG not concerning today.  I have referred you to cardiology.  Follow up as needed for continued or worsening symptoms

## 2020-11-17 NOTE — ED Triage Notes (Signed)
Patent c/o intermittent chest pain x 3 months.   Patient states "pain is LFT sided and sometimes radiates into arms".   Patient states pain subsides and is exacerbated by exertion or stress.   Patient has taken xanax and lavender for the chest pain.   History of HTN, Hyperlipidemia, and Kidney disease.   Patient was told to come to clinic by Nephrologist per patient statement.

## 2020-11-17 NOTE — ED Provider Notes (Signed)
With [facility]    CSN: [id number] Arrival date & time: 11/17/20  2993      History   Chief Complaint Chief Complaint  Patient presents with  . Chest Pain    HPI Christine Rice is a 39 y.o. female.   Patient is a 39 year old female past medical history of anxiety, kidney failure, nephrotic syndrome, hypertension.  She presents today with intermittent chest pain this been going on for approximately 3 months or more.  The pain is typically on the left side and radiates into her arms at times.  Sometimes she has some associated numbness and tingling in the hands.  Feels very fatigued after doing strenuous activity.  Pain exacerbated by exertion or stress.  Has been using Xanax and lavender randomly as needed without much relief.  Sent here for EKG.     Past Medical History:  Diagnosis Date  . Anxiety   . Kidney failure   . Nephrotic syndrome     Patient Active Problem List   Diagnosis Date Noted  . Ingrown nail of great toe of right foot 04/14/2020  . 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 Hypertensive urgency 01/10/2020  . Nephrotic syndrome, minimal change 01/10/2020  . Skin lesion 06/10/2019  . H/O hernia repair 06/10/2019  . History of smoking 10-25 pack years 07/14/2014  . Routine general medical examination at a health care facility 07/07/2014  . Cesarean delivery delivered 01/19/2012  . IBS 08/15/2007  . 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 Anxiety disorder 08/14/2007  . DEPRESSION 08/14/2007  . ADD 08/14/2007    Past Surgical History:  Procedure Laterality Date  . CESAREAN SECTION  01/06/05  . CESAREAN SECTION  01/19/2012   Procedure: CESAREAN SECTION;  Surgeon: Luz Lex, MD;  Location: [facility];  Service: Gynecology;  Laterality: N/A;  repeat  . HERNIA REPAIR    . INGUINAL HERNIA REPAIR  01/19/2012   Procedure: HERNIA REPAIR INGUINAL ADULT BILATERAL;  Surgeon: Adin Hector, MD;  Location: [facility];  Service: General;  Laterality: Bilateral;  .  RENAL BIOPSY, PERCUTANEOUS  01/06/2020      . tummy tuck    . UMBILICAL HERNIA REPAIR  01/19/2012   Procedure: HERNIA REPAIR UMBILICAL ADULT;  Surgeon: Adin Hector, MD;  Location: [facility];  Service: General;  Laterality: N/A;  . UMBILICAL HERNIA REPAIR  11-19-14    OB History    Gravida  3   Para  2   Term  2   Preterm      AB  1   Living  2     SAB      TAB  1   Ectopic      Multiple      Live Births  2            Home Medications    Prior to Admission medications   Medication Sig Start Date End Date Taking? Authorizing Provider  ALPRAZolam (XANAX) 0.25 MG tablet Take 1 tablet (0.25 mg total) by mouth daily as needed. for anxiety 08/23/20   Tower, Wynelle Fanny, MD  levonorgestrel (MIRENA) 20 MCG/24HR IUD 1 each by Intrauterine route once.    [provider]  losartan (COZAAR) 100 MG tablet Take 100 mg by mouth daily. 02/19/20 10/21/20  [provider]  Multiple Vitamin (MULTIVITAMIN) tablet Take 1 tablet by mouth daily. 01/07/20   Jennye Boroughs, MD    Family History Family History  Problem Relation Age of Onset  .  Rashes / Skin problems Mother   . COPD Father   . Breast cancer Neg Hx   . Cancer Neg Hx   . Stroke Neg Hx     Social History Social History   Tobacco Use  . Smoking status: Former Smoker    Types: Cigarettes    Quit date: 11/15/2019    Years since quitting: 1.0  . Smokeless tobacco: Never Used  Vaping Use  . Vaping Use: Former  Substance Use Topics  . Alcohol use: Yes    Alcohol/week: 0.0 standard drinks    Comment: Once in a while.  . Drug use: Yes    Types: Marijuana    Comment: occ- marijuana      Allergies   Amoxicillin, Penicillins, and Lipitor [atorvastatin]   Review of Systems Review of Systems   Physical Exam Triage Vital Signs ED Triage Vitals  Enc Vitals Group     BP 11/17/20 0915 110/78     Pulse Rate 11/17/20 0915 88     Resp 11/17/20 0915 16     Temp 11/17/20 0915 98.4 F (36.9 C)      Temp Source 11/17/20 0915 Oral     SpO2 11/17/20 0915 97 %     Weight 11/17/20 0914 162 lb 7.7 oz (73.7 kg)     Height --      Head Circumference --      Peak Flow --      Pain Score 11/17/20 0914 0     Pain Loc --      Pain Edu? --      Excl. in [place]? --    No data found.  Updated Vital Signs BP 110/78 (BP Location: Right Arm)   Pulse 88   Temp 98.4 F (36.9 C) (Oral)   Resp 16   Wt 162 lb 7.7 oz (73.7 kg)   SpO2 97%   BMI 26.42 kg/m   Visual Acuity Right Eye Distance:   Left Eye Distance:   Bilateral Distance:    Right Eye Near:   Left Eye Near:    Bilateral Near:     Physical Exam   UC Treatments / Results  Labs (all labs ordered are listed, but only abnormal results are displayed) Labs Reviewed - No data to display  EKG   Radiology No results found.  Procedures Procedures (including critical care time)  Medications Ordered in UC Medications - No data to display  Initial Impression / Assessment and Plan / UC Course  I have reviewed the triage vital signs and the nursing notes.  Pertinent labs & imaging results that were available during my care of the patient were reviewed by me and considered in my medical decision making (see chart for details).     Chest pain that has been an intermittent issue over the past couple months. Currently denying any symptoms. Sent here by nephrologist for EKG Chest pain, fatigue worsened by certain activities, exertion. Feels arrhythmias at times. Feels like all of her symptoms started after taking high-dose of prednisone previously. She is not currently take any prednisone. She is currently on losartan for blood pressure control and blood pressure is perfect today at 110/78. EKG here today not concerning. Normal sinus rhythm with rightward axis. No concern for ACS at this time. Lungs clear on exam We will refer her to cardiology for further work-up and possible Holter monitor. Patient understanding and agreed to plan.  Follow up as needed for continued or worsening symptoms  Final Clinical  Impressions(s) / UC Diagnoses   Final diagnoses:  Chest pain, unspecified type     Discharge Instructions     EKG not concerning today.  I have referred you to cardiology.  Follow up as needed for continued or worsening symptoms     ED Prescriptions    None     PDMP not reviewed this encounter.   Orvan July, NP 11/17/20 1349

## 2020-11-18 ENCOUNTER — Ambulatory Visit (INDEPENDENT_AMBULATORY_CARE_PROVIDER_SITE_OTHER): Payer: Medicaid Other | Admitting: Obstetrics and Gynecology

## 2020-11-18 ENCOUNTER — Encounter: Payer: Self-pay | Admitting: Obstetrics and Gynecology

## 2020-11-18 VITALS — BP 112/70 | HR 73 | Ht 63.0 in | Wt 166.5 lb

## 2020-11-18 DIAGNOSIS — Z30431 Encounter for routine checking of intrauterine contraceptive device: Secondary | ICD-10-CM

## 2020-11-18 DIAGNOSIS — N898 Other specified noninflammatory disorders of vagina: Secondary | ICD-10-CM

## 2020-11-18 NOTE — Progress Notes (Signed)
    GYNECOLOGY OFFICE ENCOUNTER NOTE  History:  39 y.o. Christine Rice here today for today for IUD string check; Mirena  IUD was placed  10/21/2020. No complaints about the IUD, no concerning side effects.  Does note that she has been noticing an increase in vaginal discharge lately mild odor since IUD insertion. Treated with a peroxide douche seveal weeks ago which helped. Denies itching or burning.   The following portions of the patient's history were reviewed and updated as appropriate: allergies, current medications, past family history, past medical history, past social history, past surgical history and problem list. Last pap smear on 08/18/2020 was normal, negative HRHPV.  Review of Systems:  Pertinent items are noted in HPI.   Objective:  Physical Exam Blood pressure 112/70, pulse 73, height 5\' 3"  (1.6 m), weight 166 lb 8 oz (75.5 kg). CONSTITUTIONAL: Well-developed, well-nourished female in no acute distress.  RESPIRATORY: Effort and breath sounds normal, no problems with respiration noted ABDOMEN: Soft, no distention noted.   PELVIC: Normal appearing external genitalia; normal appearing vaginal mucosa and cervix.  IUD strings visualized, about 1 cm in length outside cervix. Scant thin white discharge in vaginal vault, no odor.  Extremities: extremities normal, atraumatic, no cyanosis or edema Neurologic: Grossly normal  Assessment & Plan:  - Patient to keep IUD in place for up to six years; can come in for removal if she desires pregnancy earlier or for any concerning side effects. - Discussed homeopathic and OTC regimens for restoration of vaginal flora (patient thinks the use of betadine at time of IUD insertion may have caused a disruption in her vaginal flora).     Rubie Maid, MD [facility]

## 2020-11-18 NOTE — Progress Notes (Signed)
Pt present for IUD string check. Pt stated that she was doing well no problems.

## 2020-11-19 ENCOUNTER — Other Ambulatory Visit: Payer: Self-pay

## 2020-11-19 ENCOUNTER — Ambulatory Visit (INDEPENDENT_AMBULATORY_CARE_PROVIDER_SITE_OTHER): Payer: Medicaid Other | Admitting: Cardiology

## 2020-11-19 ENCOUNTER — Encounter: Payer: Self-pay | Admitting: Cardiology

## 2020-11-19 VITALS — BP 90/60 | HR 87 | Ht 66.0 in | Wt 161.0 lb

## 2020-11-19 DIAGNOSIS — I10 Essential (primary) hypertension: Secondary | ICD-10-CM | POA: Diagnosis not present

## 2020-11-19 DIAGNOSIS — R079 Chest pain, unspecified: Secondary | ICD-10-CM | POA: Diagnosis not present

## 2020-11-19 NOTE — Progress Notes (Signed)
Cardiology Office Note:    Date:  11/19/2020   ID:  Christine Rice, DOB 06-19-1981, MRN [id number]  PCP:  Abner Greenspan, MD  [facility] Cardiologist:  Kate Sable, MD  [place] Electrophysiologist:  None   Referring MD: Orvan July, NP   Chief Complaint  Patient presents with  . Other    NEW patient-chest pain/SOB; Meds verbally reviewed with patient.    History of Present Illness:    Christine Rice is a 39 y.o. female with a hx of anxiety, hypertension, former smoker x22 years who presents due to chest pain and shortness of breath.  Patient states being diagnosed with renal disease back in January of this year.  Was also vaping for about a year and a half prior to being diagnosed with renal disease.  Has been seeing nephrology and managed with steroids with improvement in renal function, last creatinine was normal.  Over the past year or so, she has felt more fatigued whenever she exerts herself.  Also notes chest tightness sometimes associated with exertion.  Denies any family history of heart disease.  Was diagnosed with hypertension and started on losartan after being diagnosed of renal disease.  She was seen in the ED 2 days ago on 12/7 due to chest pain.  Also endorsed having fatigue after strenuous activity.  EKG in the ED was unrevealing with no acute ischemic findings.  Past Medical History:  Diagnosis Date  . Anxiety   . Kidney failure   . Nephrotic syndrome     Past Surgical History:  Procedure Laterality Date  . CESAREAN SECTION  01/06/05  . CESAREAN SECTION  01/19/2012   Procedure: CESAREAN SECTION;  Surgeon: Luz Lex, MD;  Location: [facility];  Service: Gynecology;  Laterality: N/A;  repeat  . HERNIA REPAIR    . INGUINAL HERNIA REPAIR  01/19/2012   Procedure: HERNIA REPAIR INGUINAL ADULT BILATERAL;  Surgeon: Adin Hector, MD;  Location: [facility];  Service: General;  Laterality: Bilateral;  . RENAL BIOPSY, PERCUTANEOUS  01/06/2020      . tummy  tuck    . UMBILICAL HERNIA REPAIR  01/19/2012   Procedure: HERNIA REPAIR UMBILICAL ADULT;  Surgeon: Adin Hector, MD;  Location: [facility];  Service: General;  Laterality: N/A;  . UMBILICAL HERNIA REPAIR  11-19-14    Current Medications: Current Meds  Medication Sig  . ALPRAZolam (XANAX) 0.25 MG tablet Take 1 tablet (0.25 mg total) by mouth daily as needed. for anxiety  . levonorgestrel (MIRENA) 20 MCG/24HR IUD 1 each by Intrauterine route once.  Marland Kitchen losartan (COZAAR) 50 MG tablet Take 50 mg by mouth daily.  . Multiple Vitamin (MULTIVITAMIN) tablet Take 1 tablet by mouth daily.     Allergies:   Amoxicillin, Penicillins, and Lipitor [atorvastatin]   Social History   Socioeconomic History  . Marital status: Divorced    Spouse name: Not on file  . Number of children: 2  . Years of education: Not on file  . Highest education level: Not on file  Occupational History  . Occupation: Product manager: [employer]  . Occupation: Catering--part time  Tobacco Use  . Smoking status: Former Smoker    Types: Cigarettes    Quit date: 11/15/2019    Years since quitting: 1.0  . Smokeless tobacco: Never Used  Vaping Use  . Vaping Use: Former  Substance and Sexual Activity  . Alcohol use: Yes    Alcohol/week: 0.0 standard  drinks    Comment: Once in a while.  . Drug use: Yes    Types: Marijuana    Comment: occ- marijuana   . Sexual activity: Yes    Partners: Male    Birth control/protection: I.U.D.  Other Topics Concern  . Not on file  Social History Narrative  . Not on file   Social Determinants of Health   Financial Resource Strain: Not on file  Food Insecurity: Not on file  Transportation Needs: Not on file  Physical Activity: Not on file  Stress: Not on file  Social Connections: Not on file     Family History: The patient's family history includes COPD in her father; Rashes / Skin problems in her mother. There is no history of Breast cancer, Cancer, or  Stroke.  ROS:   Please see the history of present illness.     All other systems reviewed and are negative.  EKGs/Labs/Other Studies Reviewed:    The following studies were reviewed today:   EKG:  EKG is  ordered today.  The ekg ordered today demonstrates normal sinus rhythm, possible old septal infarct  Recent Labs: 01/01/2020: B Natriuretic Peptide 173.0 01/16/2020: Magnesium 2.5 08/13/2020: ALT 9; BUN 9; Creatinine, Ser 0.80; Hemoglobin 13.8; Platelets 199.0; Potassium 4.4; Sodium 139  Recent Lipid Panel    Component Value Date/Time   CHOL 154 08/13/2020 0904   CHOL 166 07/26/2019 1349   TRIG 66.0 08/13/2020 0904   HDL 37.00 (L) 08/13/2020 0904   HDL 46 07/26/2019 1349   CHOLHDL 4 08/13/2020 0904   VLDL 13.2 08/13/2020 0904   LDLCALC 103 (H) 08/13/2020 0904   LDLCALC 108 (H) 07/26/2019 1349     Risk Assessment/Calculations:      Physical Exam:    VS:  BP 90/60 (BP Location: Right Arm, Patient Position: Sitting, Cuff Size: Normal)   Pulse 87   Ht 5\' 6"  (1.676 m)   Wt 161 lb (73 kg)   SpO2 98%   BMI 25.99 kg/m     Wt Readings from Last 3 Encounters:  11/19/20 161 lb (73 kg)  11/18/20 166 lb 8 oz (75.5 kg)  11/17/20 162 lb 7.7 oz (73.7 kg)     GEN:  Well nourished, well developed in no acute distress HEENT: Normal NECK: No JVD; No carotid bruits LYMPHATICS: No lymphadenopathy CARDIAC: RRR, no murmurs, rubs, gallops RESPIRATORY:  Clear to auscultation without rales, wheezing or rhonchi  ABDOMEN: Soft, non-tender, non-distended MUSCULOSKELETAL:  No edema; No deformity  SKIN: Warm and dry NEUROLOGIC:  Alert and oriented x 3 PSYCHIATRIC:  Normal affect   ASSESSMENT:    1. Chest pain, unspecified type   2. Primary hypertension    PLAN:    In order of problems listed above:  1. Patient with chest pain, risk factors of hypertension, former smoker.  Get echo to evaluate cardiac function, get a Lexiscan Myoview to evaluate presence of ischemia.  Avoiding  coronary CTA due to recent renal dysfunction, just recovered.  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 2. Hypertension, BP low normal.  On losartan due to renal disease.  Recently down titrated.  Continue losartan as prescribed.  Follow-up after echo and Myoview.    Shared Decision Making/Informed Consent    The risks [chest pain, shortness of breath, cardiac arrhythmias, dizziness, blood pressure fluctuations, myocardial infarction, stroke/transient ischemic attack, nausea, vomiting, allergic reaction, radiation exposure, metallic taste sensation and life-threatening complications (estimated to be 1 in 10,000)], benefits (risk stratification, diagnosing coronary artery disease, treatment  guidance) and alternatives of a nuclear stress test were discussed in detail with Christine Rice and she agrees to proceed.       Medication Adjustments/Labs and Tests Ordered: Current medicines are reviewed at length with the patient today.  Concerns regarding medicines are outlined above.  Orders Placed This Encounter  Procedures  . NM Myocar Multi W/Spect W/Wall Motion / EF  . EKG 12-Lead  . ECHOCARDIOGRAM COMPLETE   No orders of the defined types were placed in this encounter.   Patient Instructions  Medication Instructions:  Your physician recommends that you continue on your current medications as directed. Please refer to the Current Medication list given to you today.  *If you need a refill on your cardiac medications before your next appointment, please call your pharmacy*  Lab Work: none If you have labs (blood work) drawn today and your tests are completely normal, you will receive your results only by: Marland Kitchen MyChart Message (if you have MyChart) OR . A paper copy in the mail If you have any lab test that is abnormal or we need to change your treatment, we will call you to review the results.  Testing/Procedures: Your physician has requested that you have an echocardiogram. Echocardiography is a  painless test that uses sound waves to create images of your heart. It provides your doctor with information about the size and shape of your heart and how well your heart's chambers and valves are working. This procedure takes approximately one hour. There are no restrictions for this procedure. There is a possibility that an IV may need to be started during your test to inject an image enhancing agent. This is done to obtain more optimal pictures of your heart. Therefore we ask that you do at least drink some water prior to coming in to hydrate your veins.    Your physician has requested that you have a lexiscan myoview.  [place]  Your caregiver has ordered a Stress Test with nuclear imaging. The purpose of this test is to evaluate the blood supply to your heart muscle. This procedure is referred to as a "Non-Invasive Stress Test." This is because other than having an IV started in your vein, nothing is inserted or "invades" your body. Cardiac stress tests are done to find areas of poor blood flow to the heart by determining the extent of coronary artery disease (CAD). Some patients exercise on a treadmill, which naturally increases the blood flow to your heart, while others who are  unable to walk on a treadmill due to physical limitations have a pharmacologic/chemical stress agent called Lexiscan . This medicine will mimic walking on a treadmill by temporarily increasing your coronary blood flow.   Please note: these test may take anywhere between 2-4 hours to complete  PLEASE REPORT TO [facility] AT THE FIRST DESK WILL DIRECT YOU WHERE TO GO  Date of Procedure:_____________________________________  Arrival Time for Procedure:______________________________   PLEASE NOTIFY THE OFFICE AT LEAST 24 HOURS IN ADVANCE IF YOU ARE UNABLE TO KEEP YOUR APPOINTMENT.  [phone] AND  PLEASE NOTIFY NUCLEAR MEDICINE AT [facility] AT LEAST 24 HOURS IN ADVANCE IF YOU ARE  UNABLE TO KEEP YOUR APPOINTMENT. [phone]  How to prepare for your Myoview test:  3. Do not eat or drink after midnight 4. No caffeine for 24 hours prior to test 5. No smoking 24 hours prior to test. 6. Your medication may be taken with water.  If your doctor stopped a  medication because of this test, do not take that medication. 7. Ladies, please do not wear dresses.  Skirts or pants are appropriate. Please wear a short sleeve shirt. 8. No perfume, cologne or lotion. 9. Wear comfortable walking shoes. No heels!   Follow-Up: At [facility], you and your health needs are our priority.  As part of our continuing mission to provide you with exceptional heart care, we have created designated Provider Care Teams.  These Care Teams include your primary Cardiologist (physician) and Advanced Practice Providers (APPs -  Physician Assistants and Nurse Practitioners) who all work together to provide you with the care you need, when you need it.  We recommend signing up for the patient portal called "MyChart".  Sign up information is provided on this After Visit Summary.  MyChart is used to connect with patients for Virtual Visits (Telemedicine).  Patients are able to view lab/test results, encounter notes, upcoming appointments, etc.  Non-urgent messages can be sent to your provider as well.   To learn more about what you can do with MyChart, go to [email or web address].    Your next appointment:   After testing.  The format for your next appointment:   In Person  Provider:   You may see Kate Sable, MD or one of the following Advanced Practice Providers on your designated Care Team:    Murray Hodgkins, NP  Christell Faith, PA-C  Marrianne Mood, PA-C  Cadence Kathlen Mody, [place]  Laurann Montana, NP    Cardiac Nuclear Scan A cardiac nuclear scan is a test that measures blood flow to the heart when a person is resting and when he or she is exercising. The test looks for problems such  as:  Not enough blood reaching a portion of the heart.  The heart muscle not working normally. You may need this test if:  You have heart disease.  You have had abnormal lab results.  You have had heart surgery or a balloon procedure to open up blocked arteries (angioplasty).  You have chest pain.  You have shortness of breath. In this test, a radioactive dye (tracer) is injected into your bloodstream. After the tracer has traveled to your heart, an imaging device is used to measure how much of the tracer is absorbed by or distributed to various areas of your heart. This procedure is usually done at a hospital and takes 2-4 hours. Tell a health care provider about:  Any allergies you have.  All medicines you are taking, including vitamins, herbs, eye drops, creams, and over-the-counter medicines.  Any problems you or family members have had with anesthetic medicines.  Any blood disorders you have.  Any surgeries you have had.  Any medical conditions you have.  Whether you are pregnant or may be pregnant. What are the risks? Generally, this is a safe procedure. However, problems may occur, including:  Serious chest pain and heart attack. This is only a risk if the stress portion of the test is done.  Rapid heartbeat.  Sensation of warmth in your chest. This usually passes quickly.  Allergic reaction to the tracer. What happens before the procedure?  Ask your health care provider about changing or stopping your regular medicines. This is especially important if you are taking diabetes medicines or blood thinners.  Follow instructions from your health care provider about eating or drinking restrictions.  Remove your jewelry on the day of the procedure. What happens during the procedure?  An IV will be inserted into one of your  veins.  Your health care provider will inject a small amount of radioactive tracer through the IV.  You will wait for 20-40 minutes while  the tracer travels through your bloodstream.  Your heart activity will be monitored with an electrocardiogram (ECG).  You will lie down on an exam table.  Images of your heart will be taken for about 15-20 minutes.  You may also have a stress test. For this test, one of the following may be done: ? You will exercise on a treadmill or stationary bike. While you exercise, your heart's activity will be monitored with an ECG, and your blood pressure will be checked. ? You will be given medicines that will increase blood flow to parts of your heart. This is done if you are unable to exercise.  When blood flow to your heart has peaked, a tracer will again be injected through the IV.  After 20-40 minutes, you will get back on the exam table and have more images taken of your heart.  Depending on the type of tracer used, scans may need to be repeated 3-4 hours later.  Your IV line will be removed when the procedure is over. The procedure may vary among health care providers and hospitals. What happens after the procedure?  Unless your health care provider tells you otherwise, you may return to your normal schedule, including diet, activities, and medicines.  Unless your health care provider tells you otherwise, you may increase your fluid intake. This will help to flush the contrast dye from your body. Drink enough fluid to keep your urine pale yellow.  Ask your health care provider, or the department that is doing the test: ? When will my results be ready? ? How will I get my results? Summary  A cardiac nuclear scan measures the blood flow to the heart when a person is resting and when he or she is exercising.  Tell your health care provider if you are pregnant.  Before the procedure, ask your health care provider about changing or stopping your regular medicines. This is especially important if you are taking diabetes medicines or blood thinners.  After the procedure, unless your  health care provider tells you otherwise, increase your fluid intake. This will help flush the contrast dye from your body.  After the procedure, unless your health care provider tells you otherwise, you may return to your normal schedule, including diet, activities, and medicines. This information is not intended to replace advice given to you by your health care provider. Make sure you discuss any questions you have with your health care provider. Document Revised: 05/14/2018 Document Reviewed: 05/14/2018 Elsevier Patient Education  [place].    Echocardiogram An echocardiogram is a procedure that uses painless sound waves (ultrasound) to produce an image of the heart. Images from an echocardiogram can provide important information about:  Signs of coronary artery disease (CAD).  Aneurysm detection. An aneurysm is a weak or damaged part of an artery wall that bulges out from the normal force of blood pumping through the body.  Heart size and shape. Changes in the size or shape of the heart can be associated with certain conditions, including heart failure, aneurysm, and CAD.  Heart muscle function.  Heart valve function.  Signs of a past heart attack.  Fluid buildup around the heart.  Thickening of the heart muscle.  A tumor or infectious growth around the heart valves. Tell a health care provider about:  Any allergies you have.  All medicines you are taking, including vitamins, herbs, eye drops, creams, and over-the-counter medicines.  Any blood disorders you have.  Any surgeries you have had.  Any medical conditions you have.  Whether you are pregnant or may be pregnant. What are the risks? Generally, this is a safe procedure. However, problems may occur, including:  Allergic reaction to dye (contrast) that may be used during the procedure. What happens before the procedure? No specific preparation is needed. You may eat and drink normally. What happens  during the procedure?   An IV tube may be inserted into one of your veins.  You may receive contrast through this tube. A contrast is an injection that improves the quality of the pictures from your heart.  A gel will be applied to your chest.  A wand-like tool (transducer) will be moved over your chest. The gel will help to transmit the sound waves from the transducer.  The sound waves will harmlessly bounce off of your heart to allow the heart images to be captured in real-time motion. The images will be recorded on a computer. The procedure may vary among health care providers and hospitals. What happens after the procedure?  You may return to your normal, everyday life, including diet, activities, and medicines, unless your health care provider tells you not to do that. Summary  An echocardiogram is a procedure that uses painless sound waves (ultrasound) to produce an image of the heart.  Images from an echocardiogram can provide important information about the size and shape of your heart, heart muscle function, heart valve function, and fluid buildup around your heart.  You do not need to do anything to prepare before this procedure. You may eat and drink normally.  After the echocardiogram is completed, you may return to your normal, everyday life, unless your health care provider tells you not to do that. This information is not intended to replace advice given to you by your health care provider. Make sure you discuss any questions you have with your health care provider. Document Revised: 03/21/2019 Document Reviewed: 12/31/2016 Elsevier Patient Education  2020 [place], Kate Sable, MD  11/19/2020 12:49 PM    [place]

## 2020-11-19 NOTE — Patient Instructions (Signed)
Medication Instructions:  Your physician recommends that you continue on your current medications as directed. Please refer to the Current Medication list given to you today.  *If you need a refill on your cardiac medications before your next appointment, please call your pharmacy*  Lab Work: none If you have labs (blood work) drawn today and your tests are completely normal, you will receive your results only by: Marland Kitchen MyChart Message (if you have MyChart) OR . A paper copy in the mail If you have any lab test that is abnormal or we need to change your treatment, we will call you to review the results.  Testing/Procedures: Your physician has requested that you have an echocardiogram. Echocardiography is a painless test that uses sound waves to create images of your heart. It provides your doctor with information about the size and shape of your heart and how well your heart's chambers and valves are working. This procedure takes approximately one hour. There are no restrictions for this procedure. There is a possibility that an IV may need to be started during your test to inject an image enhancing agent. This is done to obtain more optimal pictures of your heart. Therefore we ask that you do at least drink some water prior to coming in to hydrate your veins.    Your physician has requested that you have a lexiscan myoview.  [place]  Your caregiver has ordered a Stress Test with nuclear imaging. The purpose of this test is to evaluate the blood supply to your heart muscle. This procedure is referred to as a "Non-Invasive Stress Test." This is because other than having an IV started in your vein, nothing is inserted or "invades" your body. Cardiac stress tests are done to find areas of poor blood flow to the heart by determining the extent of coronary artery disease (CAD). Some patients exercise on a treadmill, which naturally increases the blood flow to your heart, while others who are  unable  to walk on a treadmill due to physical limitations have a pharmacologic/chemical stress agent called Lexiscan . This medicine will mimic walking on a treadmill by temporarily increasing your coronary blood flow.   Please note: these test may take anywhere between 2-4 hours to complete  PLEASE REPORT TO [place] AT THE FIRST DESK WILL DIRECT YOU WHERE TO GO  Date of Procedure:_____________________________________  Arrival Time for Procedure:______________________________   PLEASE NOTIFY THE OFFICE AT LEAST 24 HOURS IN ADVANCE IF YOU ARE UNABLE TO KEEP YOUR APPOINTMENT.  [phone] AND  PLEASE NOTIFY NUCLEAR MEDICINE AT [facility] AT LEAST 24 HOURS IN ADVANCE IF YOU ARE UNABLE TO KEEP YOUR APPOINTMENT. [phone]  How to prepare for your Myoview test:  1. Do not eat or drink after midnight 2. No caffeine for 24 hours prior to test 3. No smoking 24 hours prior to test. 4. Your medication may be taken with water.  If your doctor stopped a medication because of this test, do not take that medication. 5. Ladies, please do not wear dresses.  Skirts or pants are appropriate. Please wear a short sleeve shirt. 6. No perfume, cologne or lotion. 7. Wear comfortable walking shoes. No heels!   Follow-Up: At [facility], you and your health needs are our priority.  As part of our continuing mission to provide you with exceptional heart care, we have created designated Provider Care Teams.  These Care Teams include your primary Cardiologist (physician) and Advanced Practice Providers (APPs -  Physician Assistants and Nurse Practitioners) who all work together to provide you with the care you need, when you need it.  We recommend signing up for the patient portal called "MyChart".  Sign up information is provided on this After Visit Summary.  MyChart is used to connect with patients for Virtual Visits (Telemedicine).  Patients are able to view lab/test results,  encounter notes, upcoming appointments, etc.  Non-urgent messages can be sent to your provider as well.   To learn more about what you can do with MyChart, go to [email or web address].    Your next appointment:   After testing.  The format for your next appointment:   In Person  Provider:   You may see Kate Sable, MD or one of the following Advanced Practice Providers on your designated Care Team:    Murray Hodgkins, NP  Christell Faith, PA-C  Marrianne Mood, PA-C  Cadence Kathlen Mody, [place]  Laurann Montana, NP    Cardiac Nuclear Scan A cardiac nuclear scan is a test that measures blood flow to the heart when a person is resting and when he or she is exercising. The test looks for problems such as:  Not enough blood reaching a portion of the heart.  The heart muscle not working normally. You may need this test if:  You have heart disease.  You have had abnormal lab results.  You have had heart surgery or a balloon procedure to open up blocked arteries (angioplasty).  You have chest pain.  You have shortness of breath. In this test, a radioactive dye (tracer) is injected into your bloodstream. After the tracer has traveled to your heart, an imaging device is used to measure how much of the tracer is absorbed by or distributed to various areas of your heart. This procedure is usually done at a hospital and takes 2-4 hours. Tell a health care provider about:  Any allergies you have.  All medicines you are taking, including vitamins, herbs, eye drops, creams, and over-the-counter medicines.  Any problems you or family members have had with anesthetic medicines.  Any blood disorders you have.  Any surgeries you have had.  Any medical conditions you have.  Whether you are pregnant or may be pregnant. What are the risks? Generally, this is a safe procedure. However, problems may occur, including:  Serious chest pain and heart attack. This is only a risk if the  stress portion of the test is done.  Rapid heartbeat.  Sensation of warmth in your chest. This usually passes quickly.  Allergic reaction to the tracer. What happens before the procedure?  Ask your health care provider about changing or stopping your regular medicines. This is especially important if you are taking diabetes medicines or blood thinners.  Follow instructions from your health care provider about eating or drinking restrictions.  Remove your jewelry on the day of the procedure. What happens during the procedure?  An IV will be inserted into one of your veins.  Your health care provider will inject a small amount of radioactive tracer through the IV.  You will wait for 20-40 minutes while the tracer travels through your bloodstream.  Your heart activity will be monitored with an electrocardiogram (ECG).  You will lie down on an exam table.  Images of your heart will be taken for about 15-20 minutes.  You may also have a stress test. For this test, one of the following may be done: ? You will exercise on a treadmill or stationary bike. While  you exercise, your heart's activity will be monitored with an ECG, and your blood pressure will be checked. ? You will be given medicines that will increase blood flow to parts of your heart. This is done if you are unable to exercise.  When blood flow to your heart has peaked, a tracer will again be injected through the IV.  After 20-40 minutes, you will get back on the exam table and have more images taken of your heart.  Depending on the type of tracer used, scans may need to be repeated 3-4 hours later.  Your IV line will be removed when the procedure is over. The procedure may vary among health care providers and hospitals. What happens after the procedure?  Unless your health care provider tells you otherwise, you may return to your normal schedule, including diet, activities, and medicines.  Unless your health care  provider tells you otherwise, you may increase your fluid intake. This will help to flush the contrast dye from your body. Drink enough fluid to keep your urine pale yellow.  Ask your health care provider, or the department that is doing the test: ? When will my results be ready? ? How will I get my results? Summary  A cardiac nuclear scan measures the blood flow to the heart when a person is resting and when he or she is exercising.  Tell your health care provider if you are pregnant.  Before the procedure, ask your health care provider about changing or stopping your regular medicines. This is especially important if you are taking diabetes medicines or blood thinners.  After the procedure, unless your health care provider tells you otherwise, increase your fluid intake. This will help flush the contrast dye from your body.  After the procedure, unless your health care provider tells you otherwise, you may return to your normal schedule, including diet, activities, and medicines. This information is not intended to replace advice given to you by your health care provider. Make sure you discuss any questions you have with your health care provider. Document Revised: 05/14/2018 Document Reviewed: 05/14/2018 Elsevier Patient Education  [place].    Echocardiogram An echocardiogram is a procedure that uses painless sound waves (ultrasound) to produce an image of the heart. Images from an echocardiogram can provide important information about:  Signs of coronary artery disease (CAD).  Aneurysm detection. An aneurysm is a weak or damaged part of an artery wall that bulges out from the normal force of blood pumping through the body.  Heart size and shape. Changes in the size or shape of the heart can be associated with certain conditions, including heart failure, aneurysm, and CAD.  Heart muscle function.  Heart valve function.  Signs of a past heart attack.  Fluid buildup  around the heart.  Thickening of the heart muscle.  A tumor or infectious growth around the heart valves. Tell a health care provider about:  Any allergies you have.  All medicines you are taking, including vitamins, herbs, eye drops, creams, and over-the-counter medicines.  Any blood disorders you have.  Any surgeries you have had.  Any medical conditions you have.  Whether you are pregnant or may be pregnant. What are the risks? Generally, this is a safe procedure. However, problems may occur, including:  Allergic reaction to dye (contrast) that may be used during the procedure. What happens before the procedure? No specific preparation is needed. You may eat and drink normally. What happens during the procedure?  An IV tube may be inserted into one of your veins.  You may receive contrast through this tube. A contrast is an injection that improves the quality of the pictures from your heart.  A gel will be applied to your chest.  A wand-like tool (transducer) will be moved over your chest. The gel will help to transmit the sound waves from the transducer.  The sound waves will harmlessly bounce off of your heart to allow the heart images to be captured in real-time motion. The images will be recorded on a computer. The procedure may vary among health care providers and hospitals. What happens after the procedure?  You may return to your normal, everyday life, including diet, activities, and medicines, unless your health care provider tells you not to do that. Summary  An echocardiogram is a procedure that uses painless sound waves (ultrasound) to produce an image of the heart.  Images from an echocardiogram can provide important information about the size and shape of your heart, heart muscle function, heart valve function, and fluid buildup around your heart.  You do not need to do anything to prepare before this procedure. You may eat and drink normally.  After  the echocardiogram is completed, you may return to your normal, everyday life, unless your health care provider tells you not to do that. This information is not intended to replace advice given to you by your health care provider. Make sure you discuss any questions you have with your health care provider. Document Revised: 03/21/2019 Document Reviewed: 12/31/2016 Elsevier Patient Education  [place].

## 2020-11-20 ENCOUNTER — Encounter: Payer: Medicaid Other | Admitting: Obstetrics and Gynecology

## 2020-11-23 ENCOUNTER — Other Ambulatory Visit: Payer: Self-pay

## 2020-11-23 ENCOUNTER — Encounter
Admission: RE | Admit: 2020-11-23 | Discharge: 2020-11-23 | Disposition: A | Discharge status: Home / Self Care | Payer: Medicaid Other | Source: Ambulatory Visit | Attending: Cardiology | Admitting: Cardiology

## 2020-11-23 DIAGNOSIS — R079 Chest pain, unspecified: Secondary | ICD-10-CM | POA: Diagnosis not present

## 2020-11-23 LAB — NM MYOCAR MULTI W/SPECT W/WALL MOTION / EF
LV dias vol: 88 mL (ref 46–106)
LV sys vol: 25 mL
Peak HR: 118 {beats}/min
Percent HR: 65 %
Rest HR: 68 {beats}/min
SDS: 1
SRS: 3
SSS: 5
TID: 1.03

## 2020-11-23 MED ORDER — REGADENOSON 0.4 MG/5ML IV SOLN
0.4000 mg | Freq: Once | INTRAVENOUS | Status: AC
  Administered 2020-11-23: 0.4 mg via INTRAVENOUS
  Filled 2020-11-23: qty 5

## 2020-11-23 MED ORDER — TECHNETIUM TC 99M TETROFOSMIN IV KIT
30.0000 | PACK | Freq: Once | INTRAVENOUS | Status: AC | PRN
  Administered 2020-11-23: 32.388 via INTRAVENOUS

## 2020-11-23 MED ORDER — TECHNETIUM TC 99M TETROFOSMIN IV KIT
10.0000 | PACK | Freq: Once | INTRAVENOUS | Status: AC | PRN
  Administered 2020-11-23: 9.376 via INTRAVENOUS

## 2020-12-02 DIAGNOSIS — N179 Acute kidney failure, unspecified: Secondary | ICD-10-CM | POA: Diagnosis not present

## 2020-12-02 DIAGNOSIS — R809 Proteinuria, unspecified: Secondary | ICD-10-CM | POA: Diagnosis not present

## 2020-12-02 DIAGNOSIS — N050 Unspecified nephritic syndrome with minor glomerular abnormality: Secondary | ICD-10-CM | POA: Diagnosis not present

## 2020-12-02 DIAGNOSIS — R319 Hematuria, unspecified: Secondary | ICD-10-CM | POA: Diagnosis not present

## 2020-12-09 DIAGNOSIS — R319 Hematuria, unspecified: Secondary | ICD-10-CM | POA: Diagnosis not present

## 2020-12-09 DIAGNOSIS — R601 Generalized edema: Secondary | ICD-10-CM | POA: Diagnosis not present

## 2020-12-09 DIAGNOSIS — R809 Proteinuria, unspecified: Secondary | ICD-10-CM | POA: Diagnosis not present

## 2020-12-09 DIAGNOSIS — N050 Unspecified nephritic syndrome with minor glomerular abnormality: Secondary | ICD-10-CM | POA: Diagnosis not present

## 2020-12-22 ENCOUNTER — Other Ambulatory Visit: Payer: Self-pay

## 2020-12-22 ENCOUNTER — Ambulatory Visit (INDEPENDENT_AMBULATORY_CARE_PROVIDER_SITE_OTHER): Payer: Medicaid Other

## 2020-12-22 DIAGNOSIS — R079 Chest pain, unspecified: Secondary | ICD-10-CM | POA: Diagnosis not present

## 2020-12-22 LAB — ECHOCARDIOGRAM COMPLETE
AR max vel: 3.21 cm2
AV Peak grad: 4.8 mmHg
Ao pk vel: 1.1 m/s
Area-P 1/2: 3.77 cm2
Calc EF: 74.1 %
S' Lateral: 3.1 cm
Single Plane A2C EF: 75.8 %
Single Plane A4C EF: 71.9 %

## 2020-12-25 ENCOUNTER — Other Ambulatory Visit: Payer: Self-pay

## 2020-12-25 ENCOUNTER — Encounter: Payer: Self-pay | Admitting: Cardiology

## 2020-12-25 ENCOUNTER — Ambulatory Visit (INDEPENDENT_AMBULATORY_CARE_PROVIDER_SITE_OTHER): Payer: Medicaid Other | Admitting: Cardiology

## 2020-12-25 VITALS — BP 104/62 | HR 69 | Ht 66.0 in | Wt 159.0 lb

## 2020-12-25 DIAGNOSIS — R079 Chest pain, unspecified: Secondary | ICD-10-CM

## 2020-12-25 DIAGNOSIS — I10 Essential (primary) hypertension: Secondary | ICD-10-CM | POA: Diagnosis not present

## 2020-12-25 NOTE — Patient Instructions (Signed)
Medication Instructions:  Your physician recommends that you continue on your current medications as directed. Please refer to the Current Medication list given to you today.  *If you need a refill on your cardiac medications before your next appointment, please call your pharmacy*   Lab Work: None ordered If you have labs (blood work) drawn today and your tests are completely normal, you will receive your results only by: Marland Kitchen MyChart Message (if you have MyChart) OR . A paper copy in the mail If you have any lab test that is abnormal or we need to change your treatment, we will call you to review the results.   Testing/Procedures: None ordered   Follow-Up: At [facility], you and your health needs are our priority.  As part of our continuing mission to provide you with exceptional heart care, we have created designated Provider Care Teams.  These Care Teams include your primary Cardiologist (physician) and Advanced Practice Providers (APPs -  Physician Assistants and Nurse Practitioners) who all work together to provide you with the care you need, when you need it.  We recommend signing up for the patient portal called "MyChart".  Sign up information is provided on this After Visit Summary.  MyChart is used to connect with patients for Virtual Visits (Telemedicine).  Patients are able to view lab/test results, encounter notes, upcoming appointments, etc.  Non-urgent messages can be sent to your provider as well.   To learn more about what you can do with MyChart, go to [email or web address].    Your next appointment:   Your physician wants you to follow-up in: 6 months You will receive a reminder letter in the mail two months in advance. If you don't receive a letter, please call our office to schedule the follow-up appointment.   The format for your next appointment:   In Person  Provider:   Kate Sable, MD   Other Instructions N/A

## 2020-12-25 NOTE — Progress Notes (Signed)
Cardiology Office Note:    Date:  12/25/2020   ID:  Christine Rice, DOB 05/13/1981, MRN [id number]  PCP:  Abner Greenspan, MD  [facility] Cardiologist:  Kate Sable, MD  [place] Electrophysiologist:  None   Referring MD: Abner Greenspan, MD   Chief Complaint  Patient presents with  . Follow-up    S/p testing     History of Present Illness:    Christine Rice is a 40 y.o. female with a hx of anxiety, hypertension, former smoker x22 years, renal dysfunction who presents for follow up. She was last seen due to chest pain and shortness of breath.  Due to symptoms and risk factors, echo and myoview was ordered. She now presents for results.  Patient states her symptoms have been improving since the last visit, although still present.   Past Medical History:  Diagnosis Date  . Anxiety   . Kidney failure   . Nephrotic syndrome     Past Surgical History:  Procedure Laterality Date  . CESAREAN SECTION  01/06/05  . CESAREAN SECTION  01/19/2012   Procedure: CESAREAN SECTION;  Surgeon: Luz Lex, MD;  Location: [facility];  Service: Gynecology;  Laterality: N/A;  repeat  . HERNIA REPAIR    . INGUINAL HERNIA REPAIR  01/19/2012   Procedure: HERNIA REPAIR INGUINAL ADULT BILATERAL;  Surgeon: Adin Hector, MD;  Location: [facility];  Service: General;  Laterality: Bilateral;  . RENAL BIOPSY, PERCUTANEOUS  01/06/2020      . tummy tuck    . UMBILICAL HERNIA REPAIR  01/19/2012   Procedure: HERNIA REPAIR UMBILICAL ADULT;  Surgeon: Adin Hector, MD;  Location: [facility];  Service: General;  Laterality: N/A;  . UMBILICAL HERNIA REPAIR  11-19-14    Current Medications: Current Meds  Medication Sig  . ALPRAZolam (XANAX) 0.25 MG tablet Take 1 tablet (0.25 mg total) by mouth daily as needed. for anxiety  . levonorgestrel (MIRENA) 20 MCG/24HR IUD 1 each by Intrauterine route once.  Marland Kitchen losartan (COZAAR) 50 MG tablet Take 50 mg by mouth daily.  . Multiple Vitamin (MULTIVITAMIN) tablet Take 1  tablet by mouth daily.     Allergies:   Amoxicillin, Penicillins, and Lipitor [atorvastatin]   Social History   Socioeconomic History  . Marital status: Divorced    Spouse name: Not on file  . Number of children: 2  . Years of education: Not on file  . Highest education level: Not on file  Occupational History  . Occupation: Product manager: [employer]  . Occupation: Catering--part time  Tobacco Use  . Smoking status: Former Smoker    Types: Cigarettes    Quit date: 11/15/2019    Years since quitting: 1.1  . Smokeless tobacco: Never Used  Vaping Use  . Vaping Use: Former  Substance and Sexual Activity  . Alcohol use: Yes    Alcohol/week: 0.0 standard drinks    Comment: Once in a while.  . Drug use: Yes    Types: Marijuana    Comment: occ- marijuana   . Sexual activity: Yes    Partners: Male    Birth control/protection: I.U.D.  Other Topics Concern  . Not on file  Social History Narrative  . Not on file   Social Determinants of Health   Financial Resource Strain: Not on file  Food Insecurity: Not on file  Transportation Needs: Not on file  Physical Activity: Not on file  Stress: Not on file  Social Connections: Not on file     Family History: The patient's family history includes COPD in her father; Rashes / Skin problems in her mother. There is no history of Breast cancer, Cancer, or Stroke.  ROS:   Please see the history of present illness.     All other systems reviewed and are negative.  EKGs/Labs/Other Studies Reviewed:    The following studies were reviewed today:   EKG:  EKG is  ordered today.  The ekg ordered today demonstrates normal sinus rhythm, possible old septal infarct  Recent Labs: 01/01/2020: B Natriuretic Peptide 173.0 01/16/2020: Magnesium 2.5 08/13/2020: ALT 9; BUN 9; Creatinine, Ser 0.80; Hemoglobin 13.8; Platelets 199.0; Potassium 4.4; Sodium 139  Recent Lipid Panel    Component Value Date/Time   CHOL 154  08/13/2020 0904   CHOL 166 07/26/2019 1349   TRIG 66.0 08/13/2020 0904   HDL 37.00 (L) 08/13/2020 0904   HDL 46 07/26/2019 1349   CHOLHDL 4 08/13/2020 0904   VLDL 13.2 08/13/2020 0904   LDLCALC 103 (H) 08/13/2020 0904   LDLCALC 108 (H) 07/26/2019 1349     Risk Assessment/Calculations:      Physical Exam:    VS:  BP 104/62   Pulse 69   Ht 5\' 6"  (1.676 m)   Wt 159 lb (72.1 kg)   BMI 25.66 kg/m     Wt Readings from Last 3 Encounters:  12/25/20 159 lb (72.1 kg)  11/19/20 161 lb (73 kg)  11/18/20 166 lb 8 oz (75.5 kg)     GEN:  Well nourished, well developed in no acute distress HEENT: Normal NECK: No JVD; No carotid bruits LYMPHATICS: No lymphadenopathy CARDIAC: RRR, no murmurs, rubs, gallops RESPIRATORY:  Clear to auscultation without rales, wheezing or rhonchi  ABDOMEN: Soft, non-tender, non-distended MUSCULOSKELETAL:  No edema; No deformity  SKIN: Warm and dry NEUROLOGIC:  Alert and oriented x 3 PSYCHIATRIC:  Normal affect   ASSESSMENT:    1. Chest pain, unspecified type   2. Primary hypertension    PLAN:    In order of problems listed above:  1. Patient with chest pain, risk factors of hypertension, former smoker.  Echo showed normal systolic and diastolic function. myoview with no evidence for ischemia. Patient made aware of results and reassured.  Her symptoms seem to be improving.  Still has some shortness of breath with exertion.  If symptoms persist or become worse, may consider CCTA as her renal function is now normalized.  Due to history of smoking, recommend follow-up with PCP regarding pulmonary function tests or pulm referral. 2. Hypertension, BP controlled normal.  Continue losartan as prescribed.  Follow-up in 6 months.    Shared Decision Making/Informed Consent        Medication Adjustments/Labs and Tests Ordered: Current medicines are reviewed at length with the patient today.  Concerns regarding medicines are outlined above.  No orders  of the defined types were placed in this encounter.  No orders of the defined types were placed in this encounter.   Patient Instructions  Medication Instructions:  Your physician recommends that you continue on your current medications as directed. Please refer to the Current Medication list given to you today.  *If you need a refill on your cardiac medications before your next appointment, please call your pharmacy*   Lab Work: None ordered If you have labs (blood work) drawn today and your tests are completely normal, you will receive your results only by: Marland Kitchen MyChart Message (if you have  MyChart) OR . A paper copy in the mail If you have any lab test that is abnormal or we need to change your treatment, we will call you to review the results.   Testing/Procedures: None ordered   Follow-Up: At [facility], you and your health needs are our priority.  As part of our continuing mission to provide you with exceptional heart care, we have created designated Provider Care Teams.  These Care Teams include your primary Cardiologist (physician) and Advanced Practice Providers (APPs -  Physician Assistants and Nurse Practitioners) who all work together to provide you with the care you need, when you need it.  We recommend signing up for the patient portal called "MyChart".  Sign up information is provided on this After Visit Summary.  MyChart is used to connect with patients for Virtual Visits (Telemedicine).  Patients are able to view lab/test results, encounter notes, upcoming appointments, etc.  Non-urgent messages can be sent to your provider as well.   To learn more about what you can do with MyChart, go to [email or web address].    Your next appointment:   Your physician wants you to follow-up in: 6 months You will receive a reminder letter in the mail two months in advance. If you don't receive a letter, please call our office to schedule the follow-up appointment.   The format  for your next appointment:   In Person  Provider:   Kate Sable, MD   Other Instructions N/A     Signed, Kate Sable, MD  12/25/2020 10:53 AM    [place]

## 2021-02-17 ENCOUNTER — Telehealth: Payer: Self-pay | Admitting: Pharmacist

## 2021-02-17 NOTE — Telephone Encounter (Signed)
Patient failed to provide requested 2022 financial documentation. Unable to determine patient's eligibility status. No additional medication assistance will be provided by [facility] without the required proof of income documentation. Patient notified by letter.  [place]

## 2021-02-24 DIAGNOSIS — R601 Generalized edema: Secondary | ICD-10-CM | POA: Diagnosis not present

## 2021-02-24 DIAGNOSIS — R809 Proteinuria, unspecified: Secondary | ICD-10-CM | POA: Diagnosis not present

## 2021-02-24 DIAGNOSIS — R808 Other proteinuria: Secondary | ICD-10-CM | POA: Diagnosis not present

## 2021-02-24 DIAGNOSIS — N050 Unspecified nephritic syndrome with minor glomerular abnormality: Secondary | ICD-10-CM | POA: Diagnosis not present

## 2021-02-24 DIAGNOSIS — R319 Hematuria, unspecified: Secondary | ICD-10-CM | POA: Diagnosis not present

## 2021-03-01 ENCOUNTER — Other Ambulatory Visit: Payer: Medicaid Other | Admitting: Pharmacist

## 2021-03-03 DIAGNOSIS — N050 Unspecified nephritic syndrome with minor glomerular abnormality: Secondary | ICD-10-CM | POA: Diagnosis not present

## 2021-03-03 DIAGNOSIS — R809 Proteinuria, unspecified: Secondary | ICD-10-CM | POA: Diagnosis not present

## 2021-03-03 DIAGNOSIS — R601 Generalized edema: Secondary | ICD-10-CM | POA: Diagnosis not present

## 2021-03-03 DIAGNOSIS — R319 Hematuria, unspecified: Secondary | ICD-10-CM | POA: Diagnosis not present

## 2021-05-03 ENCOUNTER — Telehealth: Payer: Self-pay

## 2021-05-03 NOTE — Telephone Encounter (Signed)
Agree with UC recommendation The [employer] peanut butter was recalled for possible salmonella (which will usually resolve in a week or so)  That being said, her symptoms warrant attention   Keep up fluids - dry mouth can be a sign of dehydration along with dizziness and rapid HR

## 2021-05-03 NOTE — Telephone Encounter (Signed)
Pt notified of Dr. Marliss Coots comments and instructions and verbalized understanding. Pt is increasing her fluids, she is sticking to a bland diet. Pt thinks she will stay at home and keep an eye on her sxs. She has Zofran from when she was sick in the past and that's been helping and she has also been taking a probiotic as well. sxs of dehydration reviewed with pt again and she said if she develops any of those sxs she will go to UC but for now she is stable. Pt advised if she has any other questions or concerns to call us back pt verbalized understanding   FYI to PCP

## 2021-05-03 NOTE — Telephone Encounter (Signed)
Patient called stating that she has developed nausea, diarrhea and stomach cramps since last weekend a week ago. She eats peanut butter a lot but not sure the last time she had this but saw that it was recalled and her bottle of peanut butter is part of the recall numbers they said to check. She also drank alcohol a week ago and she usually does not drink and thought maybe it was related to that. Patient wanted to get some guidance if she needed to do anything in specific in case her symptoms are due to the recall peanut butter. No fever and no vomiting present.

## 2021-05-03 NOTE — Telephone Encounter (Signed)
Pt left /vm that she has a jar of jif peanut butter that was recalled. I spoke with pt; pt is not sure when she last ate jif peanut butter. Pt said she has not eaten any in the last wk but she does really like peanut butter and has eaten in the last few wks. Pt started with watery diarrhea multiple times a day since 04/25/21. Pt is nauseated but has not vomited. Pt said she has abd cramping in middle of lower abd. No fever or chills.No rash or h/a and no blood in stools or urine. Pt has been eating and drinking normally. Pepto helps with abd cramping but so far nothing helps with the watery diarrhea. No dizziness but pt has had dry mouth. Pt thinks she will go to [facility] [address] to be eval and possible testing. Sending note to Dr Glori Bickers and pt will cb in a day or so with update on how pt is feeling.

## 2021-06-21 ENCOUNTER — Ambulatory Visit (INDEPENDENT_AMBULATORY_CARE_PROVIDER_SITE_OTHER): Payer: Medicaid Other | Admitting: Cardiology

## 2021-06-21 ENCOUNTER — Other Ambulatory Visit: Payer: Self-pay

## 2021-06-21 ENCOUNTER — Encounter: Payer: Self-pay | Admitting: Cardiology

## 2021-06-21 VITALS — BP 140/66 | HR 68 | Ht 66.0 in | Wt 165.0 lb

## 2021-06-21 DIAGNOSIS — I10 Essential (primary) hypertension: Secondary | ICD-10-CM

## 2021-06-21 DIAGNOSIS — R002 Palpitations: Secondary | ICD-10-CM | POA: Diagnosis not present

## 2021-06-21 NOTE — Progress Notes (Signed)
Cardiology Office Note:    Date:  06/21/2021   ID:  Christine Rice, DOB Aug 08, 1981, MRN [id number]  PCP:  Abner Greenspan, MD  [facility] Cardiologist:  Kate Sable, MD  [place] Electrophysiologist:  None   Referring MD: Abner Greenspan, MD   Chief Complaint  Patient presents with   Other    6 month follow up. Patient c/o SOB. Meds reviewed verbally with patient.     History of Present Illness:    Christine Rice is a 40 y.o. female with a hx of anxiety, hypertension, former smoker x22 years,  who presents for follow up.  She was previously seen for chest pain and shortness of breath, work-up with echo and Myoview was unrevealing.  States feeling okay, has occasional flutters typically when she is out in the heat, also has some shortness of breath when she overexerts herself.  Wondering if anxiety could be contributing.  Happy about quitting smoking, kidney functions have improved,.  Checks blood pressure frequently at home, usually around 110s.   Prior notes Echocardiogram 03/980 normal systolic and diastolic function, EF 19% Lexiscan Myoview 11/2019 normal, no evidence for ischemia   Past Medical History:  Diagnosis Date   Anxiety    Kidney failure    Nephrotic syndrome     Past Surgical History:  Procedure Laterality Date   CESAREAN SECTION  01/06/05   CESAREAN SECTION  01/19/2012   Procedure: CESAREAN SECTION;  Surgeon: Luz Lex, MD;  Location: [facility];  Service: Gynecology;  Laterality: N/A;  repeat   HERNIA REPAIR     INGUINAL HERNIA REPAIR  01/19/2012   Procedure: HERNIA REPAIR INGUINAL ADULT BILATERAL;  Surgeon: Adin Hector, MD;  Location: [facility];  Service: General;  Laterality: Bilateral;   RENAL BIOPSY, PERCUTANEOUS  01/06/2020       tummy tuck     UMBILICAL HERNIA REPAIR  01/19/2012   Procedure: HERNIA REPAIR UMBILICAL ADULT;  Surgeon: Adin Hector, MD;  Location: [facility];  Service: General;  Laterality: N/A;   UMBILICAL HERNIA REPAIR   11-19-14    Current Medications: Current Meds  Medication Sig   ALPRAZolam (XANAX) 0.25 MG tablet Take 1 tablet (0.25 mg total) by mouth daily as needed. for anxiety   levonorgestrel (MIRENA) 20 MCG/24HR IUD 1 each by Intrauterine route once.   losartan (COZAAR) 50 MG tablet TAKE ONE TABLET BY MOUTH EVERY DAY   Multiple Vitamin (MULTIVITAMIN) tablet Take 1 tablet by mouth daily.     Allergies:   Amoxicillin, Penicillins, and Lipitor [atorvastatin]   Social History   Socioeconomic History   Marital status: Divorced    Spouse name: Not on file   Number of children: 2   Years of education: Not on file   Highest education level: Not on file  Occupational History   Occupation: Pharmacist, hospital    Employer: [employer]   Occupation: Catering--part time  Tobacco Use   Smoking status: Former    Pack years: 0.00    Types: Cigarettes    Quit date: 11/15/2019    Years since quitting: 1.6   Smokeless tobacco: Never  Vaping Use   Vaping Use: Former  Substance and Sexual Activity   Alcohol use: Yes    Alcohol/week: 0.0 standard drinks    Comment: Once in a while.   Drug use: Yes    Types: Marijuana    Comment: occ- marijuana    Sexual activity: Yes    Partners: Male  Birth control/protection: I.U.D.  Other Topics Concern   Not on file  Social History Narrative   Not on file   Social Determinants of Health   Financial Resource Strain: Not on file  Food Insecurity: Not on file  Transportation Needs: Not on file  Physical Activity: Not on file  Stress: Not on file  Social Connections: Not on file     Family History: The patient's family history includes COPD in her father; Rashes / Skin problems in her mother. There is no history of Breast cancer, Cancer, or Stroke.  ROS:   Please see the history of present illness.     All other systems reviewed and are negative.  EKGs/Labs/Other Studies Reviewed:    The following studies were reviewed today:   EKG:  EKG is   ordered today.  The ekg ordered today demonstrates normal sinus rhythm, possible old septal infarct  Recent Labs: 08/13/2020: ALT 9; BUN 9; Creatinine, Ser 0.80; Hemoglobin 13.8; Platelets 199.0; Potassium 4.4; Sodium 139  Recent Lipid Panel    Component Value Date/Time   CHOL 154 08/13/2020 0904   CHOL 166 07/26/2019 1349   TRIG 66.0 08/13/2020 0904   HDL 37.00 (L) 08/13/2020 0904   HDL 46 07/26/2019 1349   CHOLHDL 4 08/13/2020 0904   VLDL 13.2 08/13/2020 0904   LDLCALC 103 (H) 08/13/2020 0904   LDLCALC 108 (H) 07/26/2019 1349     Risk Assessment/Calculations:      Physical Exam:    VS:  BP 140/66 (BP Location: Left Arm, Patient Position: Sitting, Cuff Size: Normal)   Pulse 68   Ht 5\' 6"  (1.676 m)   Wt 165 lb (74.8 kg)   SpO2 98%   BMI 26.63 kg/m     Wt Readings from Last 3 Encounters:  06/21/21 165 lb (74.8 kg)  12/25/20 159 lb (72.1 kg)  11/19/20 161 lb (73 kg)     GEN:  Well nourished, well developed in no acute distress HEENT: Normal NECK: No JVD; No carotid bruits LYMPHATICS: No lymphadenopathy CARDIAC: RRR, no murmurs, rubs, gallops RESPIRATORY:  Clear to auscultation without rales, wheezing or rhonchi  ABDOMEN: Soft, non-tender, non-distended MUSCULOSKELETAL:  No edema; No deformity  SKIN: Warm and dry NEUROLOGIC:  Alert and oriented x 3 PSYCHIATRIC:  Normal affect   ASSESSMENT:    1. Primary hypertension   2. Fluttering sensation of heart     PLAN:    In order of problems listed above:  Hypertension, BP controlled normal.  Continue losartan as prescribed. Heart flutters, associated with being under the heat.  Not likely any significant arrhythmias.  Previous echo and Myoview were normal.  Patient advised to stay hydrated, stay away from the heat.  Anxiety could be contributing, plans to follow-up with PCP regarding anxiety management.  Follow-up as needed    Shared Decision Making/Informed Consent        Medication Adjustments/Labs and  Tests Ordered: Current medicines are reviewed at length with the patient today.  Concerns regarding medicines are outlined above.  Orders Placed This Encounter  Procedures   EKG 12-Lead    No orders of the defined types were placed in this encounter.   Patient Instructions  Medication Instructions:  Your physician recommends that you continue on your current medications as directed. Please refer to the Current Medication list given to you today.  *If you need a refill on your cardiac medications before your next appointment, please call your pharmacy*   Lab Work: None ordered If  you have labs (blood work) drawn today and your tests are completely normal, you will receive your results only by: [place] (if you have MyChart) OR A paper copy in the mail If you have any lab test that is abnormal or we need to change your treatment, we will call you to review the results.   Testing/Procedures: None ordered   Follow-Up: At [facility], you and your health needs are our priority.  As part of our continuing mission to provide you with exceptional heart care, we have created designated Provider Care Teams.  These Care Teams include your primary Cardiologist (physician) and Advanced Practice Providers (APPs -  Physician Assistants and Nurse Practitioners) who all work together to provide you with the care you need, when you need it.  We recommend signing up for the patient portal called "MyChart".  Sign up information is provided on this After Visit Summary.  MyChart is used to connect with patients for Virtual Visits (Telemedicine).  Patients are able to view lab/test results, encounter notes, upcoming appointments, etc.  Non-urgent messages can be sent to your provider as well.   To learn more about what you can do with MyChart, go to [email or web address].    Your next appointment:   Follow up as needed   The format for your next appointment:   In Person  Provider:    Kate Sable, MD   Other Instructions     Signed, Kate Sable, MD  06/21/2021 12:26 PM    [place]

## 2021-06-21 NOTE — Patient Instructions (Signed)

## 2021-07-01 ENCOUNTER — Ambulatory Visit (INDEPENDENT_AMBULATORY_CARE_PROVIDER_SITE_OTHER): Payer: Medicaid Other | Admitting: Family Medicine

## 2021-07-01 ENCOUNTER — Other Ambulatory Visit: Payer: Self-pay

## 2021-07-01 ENCOUNTER — Encounter: Payer: Self-pay | Admitting: Family Medicine

## 2021-07-01 VITALS — BP 92/70 | HR 81 | Temp 98.1°F | Ht 65.75 in | Wt 165.5 lb

## 2021-07-01 DIAGNOSIS — F411 Generalized anxiety disorder: Secondary | ICD-10-CM | POA: Diagnosis not present

## 2021-07-01 DIAGNOSIS — L299 Pruritus, unspecified: Secondary | ICD-10-CM | POA: Diagnosis not present

## 2021-07-01 LAB — COMPREHENSIVE METABOLIC PANEL
ALT: 12 U/L (ref 0–35)
AST: 15 U/L (ref 0–37)
Albumin: 4.4 g/dL (ref 3.5–5.2)
Alkaline Phosphatase: 52 U/L (ref 39–117)
BUN: 8 mg/dL (ref 6–23)
CO2: 26 mEq/L (ref 19–32)
Calcium: 9.1 mg/dL (ref 8.4–10.5)
Chloride: 104 mEq/L (ref 96–112)
Creatinine, Ser: 0.77 mg/dL (ref 0.40–1.20)
GFR: 96.66 mL/min (ref >60.00)
Glucose, Bld: 86 mg/dL (ref 70–99)
Potassium: 4.1 mEq/L (ref 3.5–5.1)
Sodium: 138 mEq/L (ref 135–145)
Total Bilirubin: 0.7 mg/dL (ref 0.2–1.2)
Total Protein: 6.9 g/dL (ref 6.0–8.3)

## 2021-07-01 LAB — CBC WITH DIFFERENTIAL/PLATELET
Basophils Absolute: 0.1 10*3/uL (ref 0.0–0.1)
Basophils Relative: 0.6 % (ref 0.0–3.0)
Eosinophils Absolute: 0.3 10*3/uL (ref 0.0–0.7)
Eosinophils Relative: 2.4 % (ref 0.0–5.0)
HCT: 43.5 % (ref 36.0–46.0)
Hemoglobin: 14.9 g/dL (ref 12.0–15.0)
Lymphocytes Relative: 21.9 % (ref 12.0–46.0)
Lymphs Abs: 2.6 10*3/uL (ref 0.7–4.0)
MCHC: 34.2 g/dL (ref 30.0–36.0)
MCV: 91.5 fl (ref 78.0–100.0)
Monocytes Absolute: 0.7 10*3/uL (ref 0.1–1.0)
Monocytes Relative: 5.5 % (ref 3.0–12.0)
Neutro Abs: 8.4 10*3/uL — ABNORMAL HIGH (ref 1.4–7.7)
Neutrophils Relative %: 69.6 % (ref 43.0–77.0)
Platelets: 205 10*3/uL (ref 150.0–400.0)
RBC: 4.75 Mil/uL (ref 3.87–5.11)
RDW: 13 % (ref 11.5–15.5)
WBC: 12.1 10*3/uL — ABNORMAL HIGH (ref 4.0–10.5)

## 2021-07-01 LAB — TSH: TSH: 1.51 u[IU]/mL (ref 0.35–5.50)

## 2021-07-01 LAB — T4, FREE: Free T4: 0.79 ng/dL (ref 0.60–1.60)

## 2021-07-01 LAB — T3, FREE: T3, Free: 3.8 pg/mL (ref 2.3–4.2)

## 2021-07-01 MED ORDER — HYDROXYZINE HCL 10 MG PO TABS: 10.0000 mg | ORAL_TABLET | Freq: Three times a day (TID) | ORAL | 0 refills | Status: DC | PRN

## 2021-07-01 NOTE — Assessment & Plan Note (Signed)
Inadequate control.   Discussed treatment options.  Will start with referral to counseling and follow up with PCP.  Can use hydroxyzine for  Panic attack as well.

## 2021-07-01 NOTE — Progress Notes (Signed)
Patient ID: Christine Rice, female    DOB: 06/14/1981, 40 y.o.   MRN: [id number]  This visit was conducted in person.  BP 92/70   Pulse 81   Temp 98.1 F (36.7 C) (Temporal)   Ht 5' 5.75" (1.67 m)   Wt 165 lb 8 oz (75.1 kg)   SpO2 98%   BMI 26.92 kg/m    CC: Chief Complaint  Patient presents with   Itchy Scalp    Now it is spreading to neck/shoulders   Anxiety    Subjective:   HPI: Christine Rice is a 40 y.o. female presenting on 07/01/2021 for Itchy Scalp (Now it is spreading to neck/shoulders) and Anxiety   She has noted 1-2 months.. started on scalp.. has now spread to upper back and shoulder. Also noted behind ears. No rash.. just excoriations where she scratched.  New exposures: She has been shampooing less ( 2 times a week).. now using out of shower conditioners.  No new meds. Has tried new pillows.   No  other family members with symptoms. No live exposure.   She has tried  selsyn blue... did not help...  She reports she continues to have anxiety despite being off prednisone..  She has eposide of chest squeezing, nervousness, panic.  Had a lot of stress. Has had cardiac work up. PHQ9: 7.5 mg, GAD7 12.5   Hx of depression and anxiety: Reviewed PCP's last OV note re anxiety 08/2020 Per note in past pt on alprazolam minimally and denied SSRI.  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  Relevant past medical, surgical, family and social history reviewed and updated as indicated. Interim medical history since our last visit reviewed. Allergies and medications reviewed and updated. Outpatient Medications Prior to Visit  Medication Sig Dispense Refill   ALPRAZolam (XANAX) 0.25 MG tablet Take 1 tablet (0.25 mg total) by mouth daily as needed. for anxiety 30 tablet 0   levonorgestrel (MIRENA) 20 MCG/24HR IUD 1 each by Intrauterine route once.     losartan (COZAAR) 50 MG tablet TAKE ONE TABLET BY MOUTH EVERY DAY 30 tablet 5   Multiple Vitamin (MULTIVITAMIN)  tablet Take 1 tablet by mouth daily.     No facility-administered medications prior to visit.     Per HPI unless specifically indicated in ROS section below Review of Systems  Constitutional:  Negative for fatigue and fever.  HENT:  Negative for congestion.   Eyes:  Negative for pain.  Respiratory:  Positive for chest tightness and shortness of breath. Negative for cough.   Cardiovascular:  Negative for chest pain, palpitations and leg swelling.  Gastrointestinal:  Negative for abdominal pain.  Genitourinary:  Negative for dysuria and vaginal bleeding.  Musculoskeletal:  Negative for back pain.  Neurological:  Negative for syncope, light-headedness and headaches.  Psychiatric/Behavioral:  Negative for dysphoric mood.   Objective:  BP 92/70   Pulse 81   Temp 98.1 F (36.7 C) (Temporal)   Ht 5' 5.75" (1.67 m)   Wt 165 lb 8 oz (75.1 kg)   SpO2 98%   BMI 26.92 kg/m   Wt Readings from Last 3 Encounters:  07/01/21 165 lb 8 oz (75.1 kg)  06/21/21 165 lb (74.8 kg)  12/25/20 159 lb (72.1 kg)      Physical Exam Constitutional:      General: She is not in acute distress.    Appearance: Normal appearance. She is well-developed. She is not ill-appearing or toxic-appearing.  HENT:  Head: Normocephalic.     Right Ear: Hearing, tympanic membrane, ear canal and external ear normal. Tympanic membrane is not erythematous, retracted or bulging.     Left Ear: Hearing, tympanic membrane, ear canal and external ear normal. Tympanic membrane is not erythematous, retracted or bulging.     Nose: No mucosal edema or rhinorrhea.     Right Sinus: No maxillary sinus tenderness or frontal sinus tenderness.     Left Sinus: No maxillary sinus tenderness or frontal sinus tenderness.     Mouth/Throat:     Pharynx: Uvula midline.  Eyes:     General: Lids are normal. Lids are everted, no foreign bodies appreciated.     Conjunctiva/sclera: Conjunctivae normal.     Pupils: Pupils are equal, round, and  reactive to light.  Neck:     Thyroid: No thyroid mass or thyromegaly.     Vascular: No carotid bruit.     Trachea: Trachea normal.  Cardiovascular:     Rate and Rhythm: Normal rate and regular rhythm.     Pulses: Normal pulses.     Heart sounds: Normal heart sounds, S1 normal and S2 normal. No murmur heard.   No friction rub. No gallop.  Pulmonary:     Effort: Pulmonary effort is normal. No tachypnea or respiratory distress.     Breath sounds: Normal breath sounds. No decreased breath sounds, wheezing, rhonchi or rales.  Abdominal:     General: Bowel sounds are normal.     Palpations: Abdomen is soft.     Tenderness: There is no abdominal tenderness.  Musculoskeletal:     Cervical back: Normal range of motion and neck supple.  Skin:    General: Skin is warm and dry.     Findings: No rash.     Comments: 1-2 red bumps on upper back, occ excoriations, no rash on scalp  Neurological:     Mental Status: She is alert.  Psychiatric:        Mood and Affect: Mood is not anxious or depressed.        Speech: Speech normal.        Behavior: Behavior normal. Behavior is cooperative.        Thought Content: Thought content normal.        Judgment: Judgment normal.      Results for orders placed or performed in visit on 12/22/20  ECHOCARDIOGRAM COMPLETE  Result Value Ref Range   AR max vel 3.21 cm2   AV Peak grad 4.8 mmHg   Ao pk vel 1.10 m/s   S' Lateral 3.10 cm   Area-P 1/2 3.77 cm2   Single Plane A4C EF 71.9 %   Single Plane A2C EF 75.8 %   Calc EF 74.1 %    This visit occurred during the SARS-CoV-2 public health emergency.  Safety protocols were in place, including screening questions prior to the visit, additional usage of staff PPE, and extensive cleaning of exam room while observing appropriate contact time as indicated for disinfecting solutions.   COVID 19 screen:  No recent travel or known exposure to COVID19 The patient denies respiratory symptoms of COVID 19 at this  time. The importance of social distancing was discussed today.   Assessment and Plan Problem List Items Addressed This Visit     Anxiety disorder - Primary    Inadequate control.   Discussed treatment options.  Will start with referral to counseling and follow up with PCP.  Can use hydroxyzine for  Panic  attack as well.       Relevant Medications   hydrOXYzine (ATARAX/VISTARIL) 10 MG tablet   Itching     No rash seen except 1-2 red bumps and occ excoriations/ brusing from scratching.   Will eval with labs to eval for secondary cause.   itching is in location where she can reach... ? psychogenic from poorly controlled anxiety.   Will send in hydroxyzine to use prn. She will follow up with PCP in 4 weeks.       Relevant Orders   Comprehensive metabolic panel   CBC with Differential/Platelet   TSH   T4, free   T3, free       Eliezer Lofts, MD

## 2021-07-01 NOTE — Assessment & Plan Note (Addendum)
No rash seen except 1-2 red bumps and occ excoriations/ brusing from scratching.   Will eval with labs to eval for secondary cause.   itching is in location where she can reach... ? psychogenic from poorly controlled anxiety.   Will send in hydroxyzine to use prn. She will follow up with PCP in 4 weeks.

## 2021-07-26 ENCOUNTER — Encounter: Payer: Self-pay | Admitting: Family Medicine

## 2021-07-26 ENCOUNTER — Other Ambulatory Visit: Payer: Self-pay

## 2021-07-26 ENCOUNTER — Ambulatory Visit (INDEPENDENT_AMBULATORY_CARE_PROVIDER_SITE_OTHER): Payer: Medicaid Other | Admitting: Family Medicine

## 2021-07-26 VITALS — BP 122/70 | HR 81 | Temp 98.3°F | Ht 65.75 in | Wt 166.1 lb

## 2021-07-26 DIAGNOSIS — F411 Generalized anxiety disorder: Secondary | ICD-10-CM

## 2021-07-26 DIAGNOSIS — R21 Rash and other nonspecific skin eruption: Secondary | ICD-10-CM | POA: Insufficient documentation

## 2021-07-26 DIAGNOSIS — L299 Pruritus, unspecified: Secondary | ICD-10-CM | POA: Diagnosis not present

## 2021-07-26 MED ORDER — HYDROXYZINE PAMOATE 25 MG PO CAPS: 25.0000 mg | ORAL_CAPSULE | Freq: Three times a day (TID) | ORAL | 0 refills | Status: DC | PRN

## 2021-07-26 MED ORDER — TRIAMCINOLONE ACETONIDE 0.5 % EX CREA: 1.0000 "application " | TOPICAL_CREAM | Freq: Three times a day (TID) | CUTANEOUS | 0 refills | Status: DC | PRN

## 2021-07-26 NOTE — Assessment & Plan Note (Addendum)
I do see some rash Disc poss of anx related Reviewed notes from Dr Diona Browner Hydroxyzine dose increased

## 2021-07-26 NOTE — Progress Notes (Signed)
Subjective:    Patient ID: Christine Rice, female    DOB: Jul 11, 1981, 40 y.o.   MRN: [id number]  This visit occurred during the SARS-CoV-2 public health emergency.  Safety protocols were in place, including screening questions prior to the visit, additional usage of staff PPE, and extensive cleaning of exam room while observing appropriate contact time as indicated for disinfecting solutions.   HPI Pt presents for f/u of anxiety   Wt Readings from Last 3 Encounters:  07/26/21 166 lb 1 oz (75.3 kg)  07/01/21 165 lb 8 oz (75.1 kg)  06/21/21 165 lb (74.8 kg)   27.01 kg/m  Pt saw Dr Diona Browner last month  Tx opt discussed  Ref made for counseling  Hydroxyzine px - 10 mg tid prn  Did not help itching (head and neck) -scratches  Thinks itching is due to anxiety as well  (chest pressure also)   Some irritability   She worries about things she cannot control  Thinks about people less fortunate  Intrusive thoughts - interfere with her day  Worse when fatigued  Also muscles will hurt/tense    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   Hypertensive urgency 01/10/2020   Nephrotic syndrome, minimal change 01/10/2020   H/O hernia repair 06/10/2019   History of smoking 10-25 pack years 07/14/2014    Routine general medical examination at a health care facility 07/07/2014   Cesarean delivery delivered 01/19/2012   IBS 08/15/2007   History of HPV infection 08/14/2007   Anxiety disorder 08/14/2007   DEPRESSION 08/14/2007   ADD 08/14/2007   Past Medical History:  Diagnosis Date   Anxiety    Kidney failure    Nephrotic syndrome    Past Surgical History:  Procedure Laterality Date   CESAREAN SECTION  01/06/05   CESAREAN SECTION  01/19/2012   Procedure: CESAREAN SECTION;  Surgeon: Luz Lex, MD;  Location: [facility];  Service: Gynecology;  Laterality: N/A;  repeat   HERNIA REPAIR     INGUINAL HERNIA REPAIR  01/19/2012   Procedure: HERNIA REPAIR INGUINAL ADULT BILATERAL;  Surgeon: Adin Hector, MD;  Location: [facility];  Service: General;  Laterality: Bilateral;   RENAL BIOPSY, PERCUTANEOUS  01/06/2020       tummy tuck     UMBILICAL HERNIA REPAIR  01/19/2012   Procedure: HERNIA REPAIR UMBILICAL ADULT;  Surgeon: Adin Hector, MD;  Location: [name];  Service: General;  Laterality: N/A;   UMBILICAL HERNIA REPAIR  11-19-14   Social History   Tobacco Use   Smoking status: Former    Types: Cigarettes  Quit date: 11/15/2019    Years since quitting: 1.6   Smokeless tobacco: Never  Vaping Use   Vaping Use: Former  Substance Use Topics   Alcohol use: Yes    Alcohol/week: 0.0 standard drinks    Comment: Once in a while.   Drug use: Yes    Types: Marijuana    Comment: occ- marijuana    Family History  Problem Relation Age of Onset   Rashes / Skin problems Mother    COPD Father    Breast cancer Neg Hx    Cancer Neg Hx    Stroke Neg Hx    Allergies  Allergen Reactions   Amoxicillin Hives    REACTION: rash  No associated shortness of breath or throat swelling   Penicillins Hives   Lipitor [Atorvastatin]     Aches and pains and fatigue   Current Outpatient Medications on File Prior to Visit  Medication Sig Dispense Refill   ALPRAZolam (XANAX) 0.25 MG tablet Take 1 tablet  (0.25 mg total) by mouth daily as needed. for anxiety 30 tablet 0   levonorgestrel (MIRENA) 20 MCG/24HR IUD 1 each by Intrauterine route once.     losartan (COZAAR) 50 MG tablet TAKE ONE TABLET BY MOUTH EVERY DAY 30 tablet 5   Multiple Vitamin (MULTIVITAMIN) tablet Take 1 tablet by mouth daily.     No current facility-administered medications on file prior to visit.     Review of Systems  Constitutional:  Negative for activity change, appetite change, fatigue, fever and unexpected weight change.  HENT:  Negative for congestion, ear pain, rhinorrhea, sinus pressure and sore throat.   Eyes:  Negative for pain, redness and visual disturbance.  Respiratory:  Positive for chest tightness. Negative for cough, shortness of breath and wheezing.        Gets chest tightness with panic attacks  Cardiovascular:  Negative for chest pain and palpitations.  Gastrointestinal:  Negative for abdominal pain, blood in stool, constipation and diarrhea.  Endocrine: Negative for polydipsia and polyuria.  Genitourinary:  Negative for dysuria, frequency and urgency.  Musculoskeletal:  Negative for arthralgias, back pain and myalgias.  Skin:  Negative for pallor and rash.  Allergic/Immunologic: Negative for environmental allergies.  Neurological:  Negative for dizziness, syncope and headaches.  Hematological:  Negative for adenopathy. Does not bruise/bleed easily.  Psychiatric/Behavioral:  Negative for decreased concentration and dysphoric mood. The patient is not nervous/anxious.       Objective:   Physical Exam Constitutional:      General: She is not in acute distress.    Appearance: Normal appearance. She is well-developed and normal weight. She is not ill-appearing or diaphoretic.  HENT:     Head: Normocephalic and atraumatic.     Mouth/Throat:     Pharynx: Oropharynx is clear.  Eyes:     General: No scleral icterus.       Right eye: No discharge.        Left eye: No discharge.      Conjunctiva/sclera: Conjunctivae normal.     Pupils: Pupils are equal, round, and reactive to light.  Neck:     Thyroid: No thyromegaly.     Vascular: No carotid bruit or JVD.  Cardiovascular:     Rate and Rhythm: Normal rate and regular rhythm.     Heart sounds: Normal heart sounds. No murmur heard.   No gallop.  Pulmonary:     Effort: Pulmonary effort is normal. No respiratory distress.     Breath sounds:  Normal breath sounds. No wheezing or rales.  Abdominal:     General: Bowel sounds are normal. There is no distension or abdominal bruit.     Palpations: Abdomen is soft. There is no mass.     Tenderness: There is no abdominal tenderness.  Musculoskeletal:     Cervical back: Normal range of motion and neck supple. No rigidity or tenderness.     Right lower leg: No edema.     Left lower leg: No edema.  Lymphadenopathy:     Cervical: No cervical adenopathy.  Skin:    General: Skin is warm and dry.     Coloration: Skin is not pale.     Findings: Rash present. No bruising or erythema.     Comments: Papules of different sizes on back of neck  Few with scabs/excoriations  No whelps No vesicles  Pink in color  Healthy appearing scalp posterior head and nl appearing hair/follicles   No other rash  Nl skin in axillae and web spaces of fingers    Neurological:     Mental Status: She is alert.     Cranial Nerves: No cranial nerve deficit.     Sensory: No sensory deficit.     Coordination: Coordination normal.     Deep Tendon Reflexes: Reflexes are normal and symmetric. Reflexes normal.  Psychiatric:        Attention and Perception: Attention normal.        Mood and Affect: Mood is anxious.        Speech: Speech normal.        Behavior: Behavior normal.        Thought Content: Thought content normal.        Cognition and Memory: Cognition and memory normal.     Comments: Mildly anxious  Pleasant  Talks candidly about symptoms and stressors  Good insight            Assessment & Plan:   Problem List Items Addressed This Visit       Musculoskeletal and Integument   Rash of neck    Pt c/o itching of base of skull and neck over trapezius area  Some papules on exam /few excoriations Nothing on scalp Nothing in axillae or web spaces and no known exposure to lice or insects/ticks Disc poss dermatitis Enc to get away from scented products and detergents and watch exposures Px triamcinolone 0.5% cream to use tid prn  Did inc her hydroxyzine for anxiety as well  Update if not starting to improve in a week or if worsening          Other   Anxiety disorder - Primary    Struggling lately in setting of death of her ex/ father of children  Otherwise stress is low  Used mirtazapine in the past- does not want this now due to wt gain potential  Inc hydroxyzine to 25 mg per dose prn /caution of sedation  Consider fluoxetine if not improved Counseling visit is upcoming  Overall good insight       Relevant Medications   hydrOXYzine (VISTARIL) 25 MG capsule   Itching    I do see some rash Disc poss of anx related Reviewed notes from Dr Diona Browner Hydroxyzine dose increased

## 2021-07-26 NOTE — Assessment & Plan Note (Signed)
Struggling lately in setting of death of her ex/ father of children  Otherwise stress is low  Used mirtazapine in the past- does not want this now due to wt gain potential  Inc hydroxyzine to 25 mg per dose prn /caution of sedation  Consider fluoxetine if not improved Counseling visit is upcoming  Overall good insight

## 2021-07-26 NOTE — Assessment & Plan Note (Signed)
Pt c/o itching of base of skull and neck over trapezius area  Some papules on exam /few excoriations Nothing on scalp Nothing in axillae or web spaces and no known exposure to lice or insects/ticks Disc poss dermatitis Enc to get away from scented products and detergents and watch exposures Px triamcinolone 0.5% cream to use tid prn  Did inc her hydroxyzine for anxiety as well  Update if not starting to improve in a week or if worsening

## 2021-07-26 NOTE — Patient Instructions (Addendum)
Avoid scented products  Use non scented cleansers and hair products and laundry products  Also do not use fabric softener  Talk to your nephrologist about losartan as well   Try the hydroxyzine at 25 mg (caution of sedation) for itching and anxiety  This should help sleep   Triamcinolone cream as needed   Go ahead with counseling  Take care of yourself

## 2021-08-11 ENCOUNTER — Ambulatory Visit (INDEPENDENT_AMBULATORY_CARE_PROVIDER_SITE_OTHER): Payer: Medicaid Other | Admitting: Psychology

## 2021-08-11 ENCOUNTER — Telehealth: Payer: Self-pay

## 2021-08-11 DIAGNOSIS — F411 Generalized anxiety disorder: Secondary | ICD-10-CM

## 2021-08-11 NOTE — Telephone Encounter (Signed)
Pt did covid home test that was positive 08/11/21. Covid symptoms started on 08/10/21 with fatigue,sweats, bodyaches and low grade fever. Today pt continues with body aches, chills, H/A with pain level 5 - 6. Vomited bile x 1 this AM. Pt has had runny nose. Pt denies cough, SOB, diarrhea, loss of taste or smell and no head congestion. Pt scheduled video visit with Dr.Bedsole on 08/12/21 at 12 noon. Self quarantine, drink plenty of fluids, rest, and take Tylenol for fever. UC & ED precautions given and pt voiced understanding. Pt also wanted to know if she would be able to go to an event this weekend; I advised I did not think so because that would not be 5 days and pt has had no vaccinations for covid so I am not sure if quarantine will be longer or not. Pt wanted to know if tamiflu which pt has would help covid. I advised pt to my knowledge tamiflu is given for flu but not covid. Pt said she would ck with her provider tomorrow. Sending note to DR Diona Browner and Juluis Rainier to PCP Dr Glori Bickers.

## 2021-08-12 ENCOUNTER — Other Ambulatory Visit: Payer: Self-pay

## 2021-08-12 ENCOUNTER — Telehealth (INDEPENDENT_AMBULATORY_CARE_PROVIDER_SITE_OTHER): Payer: Medicaid Other | Admitting: Family Medicine

## 2021-08-12 ENCOUNTER — Encounter: Payer: Self-pay | Admitting: Family Medicine

## 2021-08-12 VITALS — BP 134/91 | HR 99 | Temp 98.7°F | Ht 65.75 in | Wt 166.0 lb

## 2021-08-12 DIAGNOSIS — U071 COVID-19: Secondary | ICD-10-CM | POA: Insufficient documentation

## 2021-08-12 MED ORDER — MOLNUPIRAVIR EUA 200MG CAPSULE: 4.0000 | ORAL_CAPSULE | Freq: Two times a day (BID) | ORAL | 0 refills | Status: AC

## 2021-08-12 NOTE — Patient Instructions (Signed)
If your COVID test is POSITIVE you may return to work/school  if all of the following are true:  5 days since symptom onset or positive COVID-19 test, wear an mask upon return for 4 more days 2.    3 consecutive days without fever and without antipyretics 3.    You have mild symptoms. If your symptoms are moderate to severe remain out a full 10 days.    Person Under Monitoring Name: Christine Rice [facility]  Location: [address] [place] [address]   Infection Prevention Recommendations for Individuals Confirmed to have, or Being Evaluated for, 2019 Novel Coronavirus (COVID-19) Infection Who Receive Care at Home  Individuals who are confirmed to have, or are being evaluated for, COVID-19 should follow the prevention steps below until a healthcare provider or local or state health department says they can return to normal activities.  Stay home except to get medical care You should restrict activities outside your home, except for getting medical care. Do not go to work, school, or public areas, and do not use public transportation or taxis.  Call ahead before visiting your doctor Before your medical appointment, call the healthcare provider and tell them that you have, or are being evaluated for, COVID-19 infection. This will help the healthcare provider's office take steps to keep other people from getting infected. Ask your healthcare provider to call the local or state health department.  Monitor your symptoms Seek prompt medical attention if your illness is worsening (e.g., difficulty breathing). Before going to your medical appointment, call the healthcare provider and tell them that you have, or are being evaluated for, COVID-19 infection. Ask your healthcare provider to call the local or state health department.  Wear a facemask You should wear a facemask that covers your nose and mouth when you are in the same room with other people and when you visit a healthcare  provider. People who live with or visit you should also wear a facemask while they are in the same room with you.  Separate yourself from other people in your home As much as possible, you should stay in a different room from other people in your home. Also, you should use a separate bathroom, if available.  Avoid sharing household items You should not share dishes, drinking glasses, cups, eating utensils, towels, bedding, or other items with other people in your home. After using these items, you should wash them thoroughly with soap and water.  Cover your coughs and sneezes Cover your mouth and nose with a tissue when you cough or sneeze, or you can cough or sneeze into your sleeve. Throw used tissues in a lined trash can, and immediately wash your hands with soap and water for at least 20 seconds or use an alcohol-based hand rub.  Wash your [facility] your hands often and thoroughly with soap and water for at least 20 seconds. You can use an alcohol-based hand sanitizer if soap and water are not available and if your hands are not visibly dirty. Avoid touching your eyes, nose, and mouth with unwashed hands.   Prevention Steps for Caregivers and Household Members of Individuals Confirmed to have, or Being Evaluated for, COVID-19 Infection Being Cared for in the Home  If you live with, or provide care at home for, a person confirmed to have, or being evaluated for, COVID-19 infection please follow these guidelines to prevent infection:  Follow healthcare provider's instructions Make sure that you understand and can help the patient follow any  healthcare provider instructions for all care.  Provide for the patient's basic needs You should help the patient with basic needs in the home and provide support for getting groceries, prescriptions, and other personal needs.  Monitor the patient's symptoms If they are getting sicker, call his or her medical provider and tell them that the  patient has, or is being evaluated for, COVID-19 infection. This will help the healthcare provider's office take steps to keep other people from getting infected. Ask the healthcare provider to call the local or state health department.  Limit the number of people who have contact with the patient If possible, have only one caregiver for the patient. Other household members should stay in another home or place of residence. If this is not possible, they should stay in another room, or be separated from the patient as much as possible. Use a separate bathroom, if available. Restrict visitors who do not have an essential need to be in the home.  Keep older adults, very young children, and other sick people away from the patient Keep older adults, very young children, and those who have compromised immune systems or chronic health conditions away from the patient. This includes people with chronic heart, lung, or kidney conditions, diabetes, and cancer.  Ensure good ventilation Make sure that shared spaces in the home have good air flow, such as from an air conditioner or an opened window, weather permitting.  Wash your hands often Wash your hands often and thoroughly with soap and water for at least 20 seconds. You can use an alcohol based hand sanitizer if soap and water are not available and if your hands are not visibly dirty. Avoid touching your eyes, nose, and mouth with unwashed hands. Use disposable paper towels to dry your hands. If not available, use dedicated cloth towels and replace them when they become wet.  Wear a facemask and gloves Wear a disposable facemask at all times in the room and gloves when you touch or have contact with the patient's blood, body fluids, and/or secretions or excretions, such as sweat, saliva, sputum, nasal mucus, vomit, urine, or feces.  Ensure the mask fits over your nose and mouth tightly, and do not touch it during use. Throw out disposable facemasks  and gloves after using them. Do not reuse. Wash your hands immediately after removing your facemask and gloves. If your personal clothing becomes contaminated, carefully remove clothing and launder. Wash your hands after handling contaminated clothing. Place all used disposable facemasks, gloves, and other waste in a lined container before disposing them with other household waste. Remove gloves and wash your hands immediately after handling these items.  Do not share dishes, glasses, or other household items with the patient Avoid sharing household items. You should not share dishes, drinking glasses, cups, eating utensils, towels, bedding, or other items with a patient who is confirmed to have, or being evaluated for, COVID-19 infection. After the person uses these items, you should wash them thoroughly with soap and water.  Wash laundry thoroughly Immediately remove and wash clothes or bedding that have blood, body fluids, and/or secretions or excretions, such as sweat, saliva, sputum, nasal mucus, vomit, urine, or feces, on them. Wear gloves when handling laundry from the patient. Read and follow directions on labels of laundry or clothing items and detergent. In general, wash and dry with the warmest temperatures recommended on the label.  Clean all areas the individual has used often Clean all touchable surfaces, such as counters, tabletops,  doorknobs, bathroom fixtures, toilets, phones, keyboards, tablets, and bedside tables, every day. Also, clean any surfaces that may have blood, body fluids, and/or secretions or excretions on them. Wear gloves when cleaning surfaces the patient has come in contact with. Use a diluted bleach solution (e.g., dilute bleach with 1 part bleach and 10 parts water) or a household disinfectant with a label that says EPA-registered for coronaviruses. To make a bleach solution at home, add 1 tablespoon of bleach to 1 quart (4 cups) of water. For a larger supply,  add  cup of bleach to 1 gallon (16 cups) of water. Read labels of cleaning products and follow recommendations provided on product labels. Labels contain instructions for safe and effective use of the cleaning product including precautions you should take when applying the product, such as wearing gloves or eye protection and making sure you have good ventilation during use of the product. Remove gloves and wash hands immediately after cleaning.  Monitor yourself for signs and symptoms of illness Caregivers and household members are considered close contacts, should monitor their health, and will be asked to limit movement outside of the home to the extent possible. Follow the monitoring steps for close contacts listed on the symptom monitoring form.   ? If you have additional questions, contact your local health department or call the epidemiologist on call at [phone] (available 24/7). ? This guidance is subject to change. For the most up-to-date guidance from [facility], please refer to their website: [email or web address]

## 2021-08-12 NOTE — Assessment & Plan Note (Signed)
COVID19  infection. No clear sign of bacterial infection at this time. Mild to moderate symptoms on day 2-3 of illness. No SOB.  No red flags/need for ER visit or in-person exam at respiratory clinic at this time..    Pt high risk for COVID complications given   HTN and overweight.  hx of minimal change renal disease but GFR now normal. Discussed options to treat COVID including  Antivirals and MAB infusion.   Reviewed interactions of oral antivirals and her medicaitons with patient .   Will treat with 5 days of molnupiravir. If SOB begins symptoms worsening.. have low threshold for in-person exam, if severe shortness of breath ER visit recommended.   Can monitor Oxygen saturation at home with home monitor if able to obtain.  Go to ER if O2 sat < 90% on room air.  Reviewed home care and provided information through [place].  Recommended quarantine until test returns. If returns positive 5 days isolation recommended. Return to work day 6 and wear mask for 4 more days to complete 10 days. Provided info about prevention of spread of COVID 19.

## 2021-08-12 NOTE — Progress Notes (Signed)
VIRTUAL VISIT Due to national recommendations of social distancing due to [place] 19, a virtual visit is felt to be most appropriate for this patient at this time.   I connected with the patient on 08/12/21 at 12:00 PM EDT by virtual telehealth platform and verified that I am speaking with the correct person using two identifiers.   I discussed the limitations, risks, security and privacy concerns of performing an evaluation and management service by  virtual telehealth platform and the availability of in person appointments. I also discussed with the patient that there may be a patient responsible charge related to this service. The patient expressed understanding and agreed to proceed.  Patient location: Home Provider Location: [place] Participants: Eliezer Lofts and Eileen Stanford   Chief Complaint  Patient presents with   Covid Positive    History of Present Illness: 40 year old female patient of Dr. Marliss Coots with history of HTN, minimal change disease.GAD presents with COVID 19 infection.   She started having symptoms in last 2 days... noted weak legs, noted fatigue. Started have body aches, chills, sweats.  No measured fever other than 99.68F   She feels better today, no SOB, no cough, no wheeze, mild headache. Has slight loose stools.  She did take a tamiflu and a zinc.. this followed by emesis   No chronic lung disease.  Hx of kidney disease ( minimal change disease)... has had HTN with this.  Did not take BP med yesterday given she was feeling poorly.   She is drinking a lot of water.  COVID 19 screen COVID testing: home test 8/31 COVID vaccine:none COVID exposure: No recent travel or known exposure to [place]  The importance of social distancing was discussed today.    Review of Systems  Constitutional:  Negative for chills and fever.  HENT:  Negative for congestion and ear pain.   Eyes:  Negative for pain and redness.  Respiratory:  Negative for cough and  shortness of breath.   Cardiovascular:  Negative for chest pain, palpitations and leg swelling.  Gastrointestinal:  Negative for abdominal pain, blood in stool, constipation, diarrhea, nausea and vomiting.  Genitourinary:  Negative for dysuria.  Musculoskeletal:  Negative for falls and myalgias.  Skin:  Negative for rash.  Neurological:  Negative for dizziness.  Psychiatric/Behavioral:  Negative for depression. The patient is not nervous/anxious.      Past Medical History:  Diagnosis Date   Anxiety    Kidney failure    Nephrotic syndrome     reports that she quit smoking about 20 months ago. Her smoking use included cigarettes. She has never used smokeless tobacco. She reports current alcohol use. She reports current drug use. Drug: Marijuana.   Current Outpatient Medications:    ALPRAZolam (XANAX) 0.25 MG tablet, Take 1 tablet (0.25 mg total) by mouth daily as needed. for anxiety, Disp: 30 tablet, Rfl: 0   hydrOXYzine (VISTARIL) 25 MG capsule, Take 1 capsule (25 mg total) by mouth every 8 (eight) hours as needed., Disp: 30 capsule, Rfl: 0   levonorgestrel (MIRENA) 20 MCG/24HR IUD, 1 each by Intrauterine route once., Disp: , Rfl:    losartan (COZAAR) 50 MG tablet, TAKE ONE TABLET BY MOUTH EVERY DAY, Disp: 30 tablet, Rfl: 5   Multiple Vitamin (MULTIVITAMIN) tablet, Take 1 tablet by mouth daily., Disp: , Rfl:    triamcinolone cream (KENALOG) 0.5 %, Apply 1 application topically 3 (three) times daily as needed., Disp: 30 g, Rfl: 0   Observations/Objective:  Blood pressure (!) 134/91, pulse 99, temperature 98.7 F (37.1 C), temperature source Temporal, height 5' 5.75" (1.67 m), weight 166 lb (75.3 kg).  Physical Exam Constitutional:      General: The patient is not in acute distress. Pulmonary:     Effort: Pulmonary effort is normal. No respiratory distress.  Neurological:     Mental Status: The patient is alert and oriented to person, place, and time.  Psychiatric:        Mood and  Affect: Mood normal.        Behavior: Behavior normal.    Assessment and Plan Problem List Items Addressed This Visit     COVID-19 virus infection - Primary    COVID19  infection. No clear sign of bacterial infection at this time. Mild to moderate symptoms on day 2-3 of illness. No SOB.  No red flags/need for ER visit or in-person exam at respiratory clinic at this time..    Pt high risk for COVID complications given   HTN and overweight.  hx of minimal change renal disease but GFR now normal. Discussed options to treat COVID including  Antivirals and MAB infusion.   Reviewed interactions of oral antivirals and her medicaitons with patient .   Will treat with 5 days of molnupiravir. If SOB begins symptoms worsening.. have low threshold for in-person exam, if severe shortness of breath ER visit recommended.   Can monitor Oxygen saturation at home with home monitor if able to obtain.  Go to ER if O2 sat < 90% on room air.  Reviewed home care and provided information through [facility].  Recommended quarantine until test returns. If returns positive 5 days isolation recommended. Return to work day 6 and wear mask for 4 more days to complete 10 days. Provided info about prevention of spread of COVID 19.       Relevant Medications   molnupiravir EUA 200 mg CAPS   Meds ordered this encounter  Medications   molnupiravir EUA 200 mg CAPS    Sig: Take 4 capsules (800 mg total) by mouth 2 (two) times daily for 5 days.    Dispense:  40 capsule    Refill:  0       I discussed the assessment and treatment plan with the patient. The patient was provided an opportunity to ask questions and all were answered. The patient agreed with the plan and demonstrated an understanding of the instructions.   The patient was advised to call back or seek an in-person evaluation if the symptoms worsen or if the condition fails to improve as anticipated.     Eliezer Lofts, MD

## 2021-08-20 ENCOUNTER — Ambulatory Visit: Payer: Medicaid Other | Admitting: Psychology

## 2021-08-26 ENCOUNTER — Encounter: Payer: Self-pay | Admitting: Family Medicine

## 2021-08-26 ENCOUNTER — Encounter: Payer: Medicaid Other | Admitting: Obstetrics and Gynecology

## 2021-08-26 ENCOUNTER — Telehealth (INDEPENDENT_AMBULATORY_CARE_PROVIDER_SITE_OTHER): Payer: Medicaid Other | Admitting: Family Medicine

## 2021-08-26 ENCOUNTER — Other Ambulatory Visit: Payer: Self-pay

## 2021-08-26 VITALS — BP 109/73 | HR 81 | Temp 97.7°F | Ht 65.75 in | Wt 170.4 lb

## 2021-08-26 DIAGNOSIS — M2550 Pain in unspecified joint: Secondary | ICD-10-CM | POA: Diagnosis not present

## 2021-08-26 DIAGNOSIS — L299 Pruritus, unspecified: Secondary | ICD-10-CM | POA: Diagnosis not present

## 2021-08-26 DIAGNOSIS — N050 Unspecified nephritic syndrome with minor glomerular abnormality: Secondary | ICD-10-CM

## 2021-08-26 DIAGNOSIS — F411 Generalized anxiety disorder: Secondary | ICD-10-CM | POA: Diagnosis not present

## 2021-08-26 NOTE — Assessment & Plan Note (Signed)
Improved with hydroxyzine , no rash

## 2021-08-26 NOTE — Patient Instructions (Addendum)
If you can have labs at the nephrology office I will send in [place] me know Otherwise I will arrange them at [place] care of yourself  Do low impact exercise and stretching as tolerated and get rest when you can   You may have a post illness/stress syndrome causing pain also   If symptoms suddenly worsen please let me know

## 2021-08-26 NOTE — Assessment & Plan Note (Signed)
Pt continues nephrology f/u and is doing better Still concerned about weight gain  Taking losartan 50 mg daily

## 2021-08-26 NOTE — Assessment & Plan Note (Signed)
Joint and muscle /? Myofascial pain ever since treatment for minimal change dz but worse recently since covid  No joint redness or swelling and no rash or fever  No recent tick bite Plan to check labs for auto immune joint dz (RF and ANA , cbc, sed rate ) Pt would like to get this checked at upcoming nephrology visit (order done), if not -would set up at [facility] low impact exercise and stretching, heat as needed

## 2021-08-26 NOTE — Progress Notes (Signed)
Virtual Visit via Video Note  I connected with [name] on 08/26/21 at 12:00 PM EDT by a video enabled telemedicine application and verified that I am speaking with the correct person using two identifiers.  Location: Patient: home Provider: office   I discussed the limitations of evaluation and management by telemedicine and the availability of in person appointments. The patient expressed understanding and agreed to proceed.  Parties involved in encounter  Patient: Christine Rice  Provider:  Loura Pardon MD   History of Present Illness: Pt presents for f/u of itching and anxiety   Wt Readings from Last 3 Encounters:  08/26/21 170 lb 6 oz (77.3 kg)  08/12/21 166 lb (75.3 kg)  07/26/21 166 lb 1 oz (75.3 kg)   27.71 kg/m  She had covid early this month and she did not get the molnupiravir  Felt terrible for 36 hours and got better She still gets worn out quick since her kidney problems   Anxiety  Started seeing counselor /went twice  Set some goals with him   Hydroxyzine helps with the itching   Still has tightness in her chest  Has had a cardiac work up  It makes it hard to breathe during episodes  Some achiness- takes glucosamine (ongoing but worse now) ever since kidney episode Takes vitamins Protein shakes  Some routine exercise    Hurts from back of neck to feet  Back and legs after sitting , neck and back  Both joints and muscles  A little sensitive to the touch (for instance skin and hair legs)  Shaving her legs and pubic area hurts   No swollen joints  Is still gaining weight -she tracks it daily  Period-none due to IUD  No new rashes  No sun sensitivity  No fevers (even with covid)   She is stiff if she goes from still to moving  Loosens up after a while   Did some heavy lifting 2 days ago - that was worse   Has nephrology labs upcoming ([place] [facility] in [place])   Patient Active Problem List   Diagnosis Date Noted   Joint  pain 08/26/2021   COVID-19 virus infection 08/12/2021   Rash of neck 07/26/2021   Itching 07/01/2021   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   Hypertensive urgency 01/10/2020   Nephrotic syndrome, minimal change 01/10/2020   H/O hernia repair 06/10/2019   History of smoking 10-25 pack years 07/14/2014   Routine general medical examination at a health care facility 07/07/2014   Cesarean delivery delivered 01/19/2012   IBS 08/15/2007   History of HPV infection 08/14/2007   Anxiety disorder 08/14/2007   DEPRESSION 08/14/2007   ADD 08/14/2007   Past Medical History:  Diagnosis Date   Anxiety    Kidney failure    Nephrotic syndrome    Past Surgical History:  Procedure Laterality Date   CESAREAN SECTION  01/06/05   CESAREAN SECTION  01/19/2012   Procedure: CESAREAN SECTION;  Surgeon: Luz Lex, MD;  Location: [facility];  Service: Gynecology;  Laterality: N/A;  repeat   HERNIA REPAIR     INGUINAL HERNIA REPAIR  01/19/2012   Procedure: HERNIA REPAIR INGUINAL ADULT BILATERAL;  Surgeon: Adin Hector, MD;  Location: [facility];  Service: General;  Laterality: Bilateral;   RENAL BIOPSY, PERCUTANEOUS  01/06/2020       tummy tuck     UMBILICAL HERNIA REPAIR  01/19/2012   Procedure: HERNIA REPAIR UMBILICAL ADULT;  Surgeon: Remo Lipps C.  Gross, MD;  Location: [facility];  Service: General;  Laterality: N/A;   UMBILICAL HERNIA REPAIR  11-19-14   Social History   Tobacco Use   Smoking status: Former    Types: Cigarettes    Quit date: 11/15/2019    Years since quitting: 1.7   Smokeless tobacco: Never  Vaping Use   Vaping Use: Former  Substance Use Topics   Alcohol use: Yes    Alcohol/week: 0.0 standard drinks    Comment: Once in a while.   Drug use: Yes    Types: Marijuana    Comment: occ- marijuana    Family History  Problem Relation Age of Onset   Rashes / Skin problems Mother    COPD Father    Breast cancer Neg Hx    Cancer Neg Hx    Stroke Neg Hx    Allergies  Allergen Reactions    Amoxicillin Hives    REACTION: rash  No associated shortness of breath or throat swelling   Penicillins Hives   Lipitor [Atorvastatin]     Aches and pains and fatigue   Current Outpatient Medications on File Prior to Visit  Medication Sig Dispense Refill   ALPRAZolam (XANAX) 0.25 MG tablet Take 1 tablet (0.25 mg total) by mouth daily as needed. for anxiety 30 tablet 0   ASPIRIN 81 PO Take 81 mg by mouth daily.     Glucosamine 750 MG TABS Take 1 tablet by mouth daily.     hydrOXYzine (VISTARIL) 25 MG capsule Take 1 capsule (25 mg total) by mouth every 8 (eight) hours as needed. 30 capsule 0   levonorgestrel (MIRENA) 20 MCG/24HR IUD 1 each by Intrauterine route once.     losartan (COZAAR) 50 MG tablet TAKE ONE TABLET BY MOUTH EVERY DAY 30 tablet 5   Multiple Vitamin (MULTIVITAMIN) tablet Take 1 tablet by mouth daily.     triamcinolone cream (KENALOG) 0.5 % Apply 1 application topically 3 (three) times daily as needed. 30 g 0   No current facility-administered medications on file prior to visit.     Review of Systems  Constitutional:  Positive for malaise/fatigue. Negative for chills and fever.  HENT:  Negative for congestion, ear pain, sinus pain and sore throat.   Eyes:  Negative for blurred vision, discharge and redness.  Respiratory:  Negative for cough, shortness of breath and stridor.   Cardiovascular:  Negative for chest pain, palpitations and leg swelling.  Gastrointestinal:  Negative for abdominal pain, diarrhea, nausea and vomiting.  Musculoskeletal:  Positive for back pain, joint pain and myalgias.  Skin:  Negative for rash.  Neurological:  Negative for dizziness and headaches.   Observations/Objective: Patient appears well, in no distress Weight is baseline  No facial swelling or asymmetry Normal voice-not hoarse and no slurred speech No obvious tremor or mobility impairment Moving neck and UEs normally Able to hear the call well  No cough or shortness of breath during  interview  Talkative and mentally sharp with no cognitive changes No skin changes on face or neck , no rash or pallor Affect is normal     Assessment and Plan: Problem List Items Addressed This Visit       Genitourinary   Minimal change disease    Pt continues nephrology f/u and is doing better Still concerned about weight gain  Taking losartan 50 mg daily        Other   Anxiety disorder    Improved with counseling visits-plans to continue  Also hydroxyzine  Reviewed stressors/ coping techniques/symptoms/ support sources/ tx options and side effects in detail today Will continue to follow       Itching    Improved with hydroxyzine , no rash       Joint pain - Primary    Joint and muscle /? Myofascial pain ever since treatment for minimal change dz but worse recently since covid  No joint redness or swelling and no rash or fever  No recent tick bite Plan to check labs for auto immune joint dz (RF and ANA , cbc, sed rate ) Pt would like to get this checked at upcoming nephrology visit (order done), if not -would set up at [facility] low impact exercise and stretching, heat as needed         Follow Up Instructions: If you can have labs at the nephrology office I will send in [place] me know Otherwise I will arrange them at [place] care of yourself  Do low impact exercise and stretching as tolerated and get rest when you can   You may have a post illness/stress syndrome causing pain also   If symptoms suddenly worsen please let me know    I discussed the assessment and treatment plan with the patient. The patient was provided an opportunity to ask questions and all were answered. The patient agreed with the plan and demonstrated an understanding of the instructions.   The patient was advised to call back or seek an in-person evaluation if the symptoms worsen or if the condition fails to improve as anticipated.     Loura Pardon,  MD

## 2021-08-26 NOTE — Assessment & Plan Note (Signed)
Improved with counseling visits-plans to continue  Also hydroxyzine  Reviewed stressors/ coping techniques/symptoms/ support sources/ tx options and side effects in detail today Will continue to follow

## 2021-08-30 DIAGNOSIS — R809 Proteinuria, unspecified: Secondary | ICD-10-CM | POA: Diagnosis not present

## 2021-08-30 DIAGNOSIS — M2550 Pain in unspecified joint: Secondary | ICD-10-CM | POA: Diagnosis not present

## 2021-08-30 DIAGNOSIS — R808 Other proteinuria: Secondary | ICD-10-CM | POA: Diagnosis not present

## 2021-08-30 DIAGNOSIS — R319 Hematuria, unspecified: Secondary | ICD-10-CM | POA: Diagnosis not present

## 2021-08-30 DIAGNOSIS — N179 Acute kidney failure, unspecified: Secondary | ICD-10-CM | POA: Diagnosis not present

## 2021-08-30 DIAGNOSIS — R601 Generalized edema: Secondary | ICD-10-CM | POA: Diagnosis not present

## 2021-08-30 DIAGNOSIS — N050 Unspecified nephritic syndrome with minor glomerular abnormality: Secondary | ICD-10-CM | POA: Diagnosis not present

## 2021-09-06 DIAGNOSIS — N050 Unspecified nephritic syndrome with minor glomerular abnormality: Secondary | ICD-10-CM | POA: Diagnosis not present

## 2021-09-06 DIAGNOSIS — R319 Hematuria, unspecified: Secondary | ICD-10-CM | POA: Diagnosis not present

## 2021-09-06 DIAGNOSIS — R601 Generalized edema: Secondary | ICD-10-CM | POA: Diagnosis not present

## 2021-09-06 DIAGNOSIS — R809 Proteinuria, unspecified: Secondary | ICD-10-CM | POA: Diagnosis not present

## 2021-09-13 ENCOUNTER — Other Ambulatory Visit: Payer: Self-pay | Admitting: Family Medicine

## 2021-09-14 NOTE — Telephone Encounter (Signed)
Last OV - 07/26/2021 Next OV - N/A Last Filled - Xanax - 08/23/2020        - Vistaril - 07/26/2021

## 2021-09-16 ENCOUNTER — Ambulatory Visit (INDEPENDENT_AMBULATORY_CARE_PROVIDER_SITE_OTHER): Payer: Medicaid Other | Admitting: Psychology

## 2021-09-16 DIAGNOSIS — F411 Generalized anxiety disorder: Secondary | ICD-10-CM

## 2021-09-24 ENCOUNTER — Other Ambulatory Visit: Payer: Self-pay | Admitting: Family Medicine

## 2021-09-24 NOTE — Telephone Encounter (Signed)
Last OV was 08/26/21, last filled on 09/14/21 #30 tabs with 0 refills

## 2021-09-30 ENCOUNTER — Ambulatory Visit (INDEPENDENT_AMBULATORY_CARE_PROVIDER_SITE_OTHER): Payer: Medicaid Other | Admitting: Psychology

## 2021-09-30 DIAGNOSIS — F411 Generalized anxiety disorder: Secondary | ICD-10-CM

## 2021-10-15 ENCOUNTER — Other Ambulatory Visit: Payer: Self-pay

## 2021-10-15 ENCOUNTER — Telehealth (INDEPENDENT_AMBULATORY_CARE_PROVIDER_SITE_OTHER): Payer: Medicaid Other | Admitting: Family Medicine

## 2021-10-15 ENCOUNTER — Encounter: Payer: Self-pay | Admitting: Family Medicine

## 2021-10-15 ENCOUNTER — Encounter: Payer: Medicaid Other | Admitting: Obstetrics and Gynecology

## 2021-10-15 VITALS — BP 117/82 | HR 78 | Wt 168.1 lb

## 2021-10-15 DIAGNOSIS — M2550 Pain in unspecified joint: Secondary | ICD-10-CM

## 2021-10-15 DIAGNOSIS — R42 Dizziness and giddiness: Secondary | ICD-10-CM

## 2021-10-15 DIAGNOSIS — Z87891 Personal history of nicotine dependence: Secondary | ICD-10-CM

## 2021-10-15 DIAGNOSIS — F411 Generalized anxiety disorder: Secondary | ICD-10-CM | POA: Diagnosis not present

## 2021-10-15 MED ORDER — FLUTICASONE PROPIONATE 50 MCG/ACT NA SUSP: 2.0000 | Freq: Every day | NASAL | 6 refills | Status: DC

## 2021-10-15 NOTE — Progress Notes (Signed)
Virtual Visit via Video Note  I connected with Christine Rice on 10/15/21 at  9:30 AM EDT by a video enabled telemedicine application and verified that I am speaking with the correct person using two identifiers.  Location: Patient: home Provider: office    I discussed the limitations of evaluation and management by telemedicine and the availability of in person appointments. The patient expressed understanding and agreed to proceed.  Parties involved in encounter  Patient: Christine Rice  Provider:  Loura Pardon MD   History of Present Illness: Pt presents with sinus pressure and chest tightness   Symptoms are going on for a while   Neck tension/pain that shoots up to head and down to chest Head feels pressure and headache (back and sides)  Chest feels pressure   Bp has been fine   No eye symptoms  No vision change  No ST (just tonsil stones) Ringing in ears /no pain / no change in hearing  No runny or stuffy nose   May have some fall allergies?  Smells pungent smells occ No cough  Does not clear throat   No acid reflux symptoms, occ gas bubble  No n/v   Some stressors/ anxiety Fatigue has been a big stressor for her  Takes hydroxyzine at night  Sleep varies   Balanced diet/also protein shakes  Short work outs several times per week   A little wheezing occasionally - all the time  No colored mucous   Tired  Dizzy if she bends down and then gets up  Occ a spinning feeling   (does not drink etoh) and at other times light headed   Low back hurts in the am -until she has a bm  A lot of tonsil stones recent   Has appt with rheumatologist on the 15th for joint problems     Patient Active Problem List   Diagnosis Date Noted   Dizziness 10/15/2021   Joint pain 08/26/2021   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   Hypertensive urgency 01/10/2020   Nephrotic syndrome, minimal change 01/10/2020   H/O hernia repair 06/10/2019   History of smoking 10-25 pack years  07/14/2014   Routine general medical examination at a health care facility 07/07/2014   Cesarean delivery delivered 01/19/2012   IBS 08/15/2007   History of HPV infection 08/14/2007   Anxiety disorder 08/14/2007   DEPRESSION 08/14/2007   ADD 08/14/2007   Past Medical History:  Diagnosis Date   Anxiety    Kidney failure    Nephrotic syndrome    Past Surgical History:  Procedure Laterality Date   CESAREAN SECTION  01/06/05   CESAREAN SECTION  01/19/2012   Procedure: CESAREAN SECTION;  Surgeon: Luz Lex, MD;  Location: [facility];  Service: Gynecology;  Laterality: N/A;  repeat   HERNIA REPAIR     INGUINAL HERNIA REPAIR  01/19/2012   Procedure: HERNIA REPAIR INGUINAL ADULT BILATERAL;  Surgeon: Adin Hector, MD;  Location: [facility];  Service: General;  Laterality: Bilateral;   RENAL BIOPSY, PERCUTANEOUS  01/06/2020       tummy tuck     UMBILICAL HERNIA REPAIR  01/19/2012   Procedure: HERNIA REPAIR UMBILICAL ADULT;  Surgeon: Adin Hector, MD;  Location: [facility];  Service: General;  Laterality: N/A;   UMBILICAL HERNIA REPAIR  11-19-14   Social History   Tobacco Use   Smoking status: Former    Types: Cigarettes    Quit date: 11/15/2019    Years since quitting: 1.9  Smokeless tobacco: Never  Vaping Use   Vaping Use: Former  Substance Use Topics   Alcohol use: Yes    Alcohol/week: 0.0 standard drinks    Comment: Once in a while.   Drug use: Yes    Types: Marijuana    Comment: occ- marijuana    Family History  Problem Relation Age of Onset   Rashes / Skin problems Mother    COPD Father    Breast cancer Neg Hx    Cancer Neg Hx    Stroke Neg Hx    Allergies  Allergen Reactions   Amoxicillin Hives    REACTION: rash  No associated shortness of breath or throat swelling   Penicillins Hives   Lipitor [Atorvastatin]     Aches and pains and fatigue   Current Outpatient Medications on File Prior to Visit  Medication Sig Dispense Refill   ALPRAZolam (XANAX) 0.25 MG tablet  TAKE 1 TABLET BY MOUTH DAILY AS NEEDED FOR ANXIETY 30 tablet 0   ASPIRIN 81 PO Take 81 mg by mouth daily.     Glucosamine 750 MG TABS Take 1 tablet by mouth daily.     hydrOXYzine (VISTARIL) 25 MG capsule TAKE 1 CAPSULE BY MOUTH EVERY 8 HOURS ASNEEDED 90 capsule 0   levonorgestrel (MIRENA) 20 MCG/24HR IUD 1 each by Intrauterine route once.     losartan (COZAAR) 50 MG tablet TAKE ONE TABLET BY MOUTH EVERY DAY 30 tablet 5   Multiple Vitamin (MULTIVITAMIN) tablet Take 1 tablet by mouth daily.     No current facility-administered medications on file prior to visit.   Review of Systems  Constitutional:  Negative for chills, fever and malaise/fatigue.       Some fatigue   HENT:  Positive for congestion. Negative for ear pain, sinus pain and sore throat.   Eyes:  Negative for blurred vision, discharge and redness.  Respiratory:  Positive for wheezing. Negative for cough, shortness of breath and stridor.        Chest tightness Occ wheeze  Cardiovascular:  Negative for chest pain, palpitations and leg swelling.  Gastrointestinal:  Negative for abdominal pain, diarrhea, nausea and vomiting.  Musculoskeletal:  Positive for neck pain. Negative for myalgias.       Arthralgias  Skin:  Negative for rash.  Neurological:  Positive for headaches. Negative for dizziness.  Psychiatric/Behavioral:  The patient is nervous/anxious.    Observations/Objective: Patient appears well, in no distress Weight is baseline  No facial swelling or asymmetry Normal voice-not hoarse and no slurred speech No obvious tremor or mobility impairment Moving neck and UEs normally Able to hear the call well  No cough or shortness of breath during interview  Talkative and mentally sharp with no cognitive changes No skin changes on face or neck , no rash or pallor Affect is mildly anxious, pleasant and talkative   Assessment and Plan: Problem List Items Addressed This Visit       Other   Anxiety disorder    This may  be adding to her physical symptoms  Taking hydroxyzine which helps  Reviewed stressors/ coping techniques/symptoms/ support sources/ tx options and side effects in detail today Stressors are significant Enc good self care and continued exercise       History of smoking 10-25 pack years    Per pt , occ wheeze (mild) Will eval further at in person visit      Joint pain    Pt has a rheumatology appt upcoming  No changes  Dizziness - Primary    In the setting of head/sinus pressure and also stressors with anx reaction  This is positional and occ spinning feeling  No h/o low bp  She has had cardiology and renal w/u  Recommend she starts flonase daily to help sinus/ETD issues  Good fluid intake -continued renal f/u  Plan appt here in person for further eval          Follow Up Instructions: Try the flonase nasal spray for sinus problems to see if this helps head pressure and dizziness  Watch for congestion or facial pain   Drink fluids  Watch your blood pressure   See the rheumatologist as planned   Alert [place] in 2 weeks if not improved and we will arrange to see you and do exam in person   Of symptoms worsen or change before then let me know    I discussed the assessment and treatment plan with the patient. The patient was provided an opportunity to ask questions and all were answered. The patient agreed with the plan and demonstrated an understanding of the instructions.   The patient was advised to call back or seek an in-person evaluation if the symptoms worsen or if the condition fails to improve as anticipated.     Loura Pardon, MD

## 2021-10-15 NOTE — Patient Instructions (Signed)
Try the flonase nasal spray for sinus problems to see if this helps head pressure and dizziness  Watch for congestion or facial pain   Drink fluids  Watch your blood pressure   See the rheumatologist as planned   Alert [place] in 2 weeks if not improved and we will arrange to see you and do exam in person   Of symptoms worsen or change before then let me know

## 2021-10-17 NOTE — Assessment & Plan Note (Signed)
Per pt , occ wheeze (mild) Will eval further at in person visit

## 2021-10-17 NOTE — Assessment & Plan Note (Signed)
Pt has a rheumatology appt upcoming  No changes

## 2021-10-17 NOTE — Assessment & Plan Note (Signed)
This may be adding to her physical symptoms  Taking hydroxyzine which helps  Reviewed stressors/ coping techniques/symptoms/ support sources/ tx options and side effects in detail today Stressors are significant Enc good self care and continued exercise

## 2021-10-17 NOTE — Assessment & Plan Note (Signed)
In the setting of head/sinus pressure and also stressors with anx reaction  This is positional and occ spinning feeling  No h/o low bp  She has had cardiology and renal w/u  Recommend she starts flonase daily to help sinus/ETD issues  Good fluid intake -continued renal f/u  Plan appt here in person for further eval

## 2021-10-18 ENCOUNTER — Ambulatory Visit: Payer: Medicaid Other | Admitting: Psychology

## 2021-10-25 NOTE — Progress Notes (Signed)
Office Visit Note  Patient: Christine Rice             Date of Birth: 08-16-1981           MRN: [id number]             PCP: Abner Greenspan, MD Referring: Gena Fray, DO Visit Date: 10/26/2021 Occupation: Catering service  Subjective:   History of Present Illness: Christine Rice is a 40 y.o. 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  Symptoms really started since around the end of 2020 when she quit cigarette smoking and began vaping this subsequently developed anasarca with nephrotic range proteinuria.  She stopped vaping due to concern of triggering this inflammation.  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  She does continue to follow-up with [facility] with only trace proteinuria. However even since improved the swelling she is experienced persistent symptoms including joint and muscle pain in multiple areas, chronic fatigue, headaches, and episodes of chest pain.  Joint pains are often generalized not in 1 specific area a common problem is the low back awakening her from sleeping.  She does not take much medicine for this she tried meloxicam very infrequently and had a large improvement after 1 pill. She had cardiology evaluation for the recurrent chest pains with negative EKG and stress testing.  Her fatigue she describes generalized worsening exertional tolerance as each day goes on and she will often feel symptoms lasting 1 day afterwards with heavier exertion.  She was previously physically active self described "gym rat" but this has fallen off substantially as well.  She has noticed slow progressive weight gain of about one half pound per week even since off the prednisone.  He  feels episodes of lightheadedness type dizziness but no syncope or falls not associated with specific positional changes.  Headaches are new for her since in the past 2 years they usually present bilaterally feels a severe tightness or tension type of sensation. Lab testing checked recently was negative for elevated inflammatory markers, low complements, ANA, or rheumatoid factor.  Labs reviewed ANA neg RF neg  08/2021 CBC wnl BMP wnl  12/2019 ANA neg ANCAs neg Complement C3 C4 wnl  Activities of Daily Living:  Patient reports morning stiffness for 1-2 hours.   Patient Reports nocturnal pain.  Difficulty dressing/grooming: Denies Difficulty climbing stairs: Denies Difficulty getting out of chair: Denies Difficulty using hands for taps, buttons, cutlery, and/or writing: Denies  Review of Systems  Constitutional:  Positive for fatigue.  HENT:  Negative for mouth sores, mouth dryness and nose dryness.   Eyes:  Negative for pain, itching and dryness.  Respiratory:  Positive for shortness of breath and difficulty breathing.   Cardiovascular:  Positive for chest pain and palpitations.  Gastrointestinal:  Negative for blood in stool, constipation and diarrhea.  Endocrine: Negative for increased urination.  Genitourinary:  Negative for difficulty urinating.  Musculoskeletal:  Positive for joint pain, joint pain, joint swelling, myalgias, morning stiffness, muscle tenderness and myalgias.  Skin:  Negative for color change, rash and redness.  Allergic/Immunologic: Negative for susceptible to infections.  Neurological:  Positive for dizziness, headaches and memory loss. Negative for numbness.  Hematological:  Negative for bruising/bleeding tendency.  Psychiatric/Behavioral:  Negative for confusion.    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    Hypertensive urgency 01/10/2020   Chronic headaches 01/10/2020   H/O hernia repair 06/10/2019   History of smoking 10-25 pack years 07/14/2014   Routine general medical examination at a health care facility 07/07/2014   Cesarean delivery delivered 01/19/2012   IBS 08/15/2007   History of HPV infection 08/14/2007   Anxiety disorder 08/14/2007   DEPRESSION 08/14/2007   ADD 08/14/2007    Past Medical History:  Diagnosis Date   Anxiety    Kidney failure    Nephrotic syndrome     Family History  Problem Relation Age of Onset   Rashes / Skin problems Mother    COPD Father    Stroke Father    Healthy Daughter    Healthy Daughter    Breast cancer Neg Hx    Cancer Neg Hx    Past Surgical History:  Procedure Laterality Date   CESAREAN SECTION  01/06/2005   CESAREAN SECTION  01/19/2012   Procedure: CESAREAN SECTION;  Surgeon: Luz Lex, MD;  Location: [facility];  Service: Gynecology;  Laterality: N/A;  repeat   HERNIA REPAIR     INGUINAL HERNIA REPAIR  01/19/2012   Procedure: HERNIA REPAIR INGUINAL ADULT BILATERAL;  Surgeon: Adin Hector, MD;  Location: [facility];  Service: General;  Laterality: Bilateral;   RENAL BIOPSY, PERCUTANEOUS  01/06/2020       tummy tuck     UMBILICAL HERNIA REPAIR  01/19/2012   Procedure: HERNIA REPAIR UMBILICAL ADULT;  Surgeon: Adin Hector, MD;  Location: [facility];  Service: General;  Laterality: N/A;   UMBILICAL HERNIA REPAIR  11/19/2014   WISDOM TOOTH EXTRACTION     Social History   Social History Narrative   Not on file   Immunization History  Administered Date(s) Administered   Influenza Whole 11/03/2010   Influenza,inj,Quad PF,6+ Mos 12/02/2013   Pneumococcal Polysaccharide-23 07/14/2014   Td 03/22/1999   Tdap 07/14/2014     Objective: Vital Signs: BP 112/81 (BP Location: Right Arm, Patient Position: Sitting, Cuff Size: Normal)   Pulse 69   Ht 5\' 6"  (1.676 m)   Wt 170 lb (77.1 kg)   BMI 27.44 kg/m    Physical Exam HENT:      Right Ear: External ear normal.     Left Ear: External ear normal.     Mouth/Throat:     Mouth: Mucous membranes are moist.     Pharynx: Oropharynx is clear.     Comments: Several molars absent Eyes:     Conjunctiva/sclera: Conjunctivae normal.  Cardiovascular:     Rate and Rhythm: Normal rate and regular rhythm.  Pulmonary:     Effort: Pulmonary effort is normal.     Breath sounds: Normal breath sounds.  Musculoskeletal:     Right lower leg: No edema.     Left lower leg: No edema.  Skin:    General: Skin is warm and dry.     Findings: No rash.  Neurological:     General: No focal deficit present.     Mental Status: She is alert.     Deep Tendon Reflexes: Reflexes normal.  Psychiatric:        Mood and Affect: Mood normal.     Musculoskeletal Exam:  Neck full ROM no  tenderness Shoulders full ROM, tenderness to pressure over right posterior deltoid and trapezius muscles with palpable knot or tightness Elbows full ROM no tenderness or swelling Wrists full ROM no tenderness or swelling Fingers full ROM no tenderness or swelling No paraspinal tenderness to palpation over upper and lower back Hip normal internal and external rotation without pain, no tenderness to lateral hip palpation Knees full ROM no tenderness or swelling Ankles full ROM no tenderness or swelling MTPs full ROM no tenderness or swelling   Investigation: No additional findings.  Imaging: No results found.  Recent Labs: Lab Results  Component Value Date   WBC 12.1 (H) 07/01/2021   HGB 14.9 07/01/2021   PLT 205.0 07/01/2021   NA 138 07/01/2021   K 4.1 07/01/2021   CL 104 07/01/2021   CO2 26 07/01/2021   GLUCOSE 86 07/01/2021   BUN 8 07/01/2021   CREATININE 0.77 07/01/2021   BILITOT 0.7 07/01/2021   ALKPHOS 52 07/01/2021   AST 15 07/01/2021   ALT 12 07/01/2021   PROT 6.9 07/01/2021   ALBUMIN 4.4 07/01/2021   CALCIUM 9.1 07/01/2021   GFRAA >60 01/16/2020    Speciality Comments: No  specialty comments available.  Procedures:  No procedures performed Allergies: Amoxicillin, Penicillins, and Lipitor [atorvastatin]   Assessment / Plan:     Visit Diagnoses: Myofascial pain Arthralgia, unspecified joint  Joint pains and muscle pains pretty generalized and no appreciable physical changes. I believe fibromyalgia syndrome upper back myofascial pain is the main active problem currently. We discussed treatments for this. Recommended she review [place] fibromyalgia patient self management recommendations. Can continued as needed naproxen for joint pains as needed. Some patients may benefit with addition of SNRI medications or sometimes TCAs. She is interested in maximizing non-medication options at this time.  Irritable bowel syndrome, unspecified type Dizziness Chronic nonintractable headache, unspecified headache type  Symptoms symptoms present with generalized joint pains are proportion to observable changes, irritable bowel symptoms, intermittent nonspecific dizziness, and worsening headaches appear consistent with fibromyalgia syndrome features and may be increased due to this. Hopefully can have some improvement with working on sleep and symptom management.  Minimal change disease  Currently doing well no edema present no rashes recent labs doing well. I do not see evidence of systemic problem active at this time.  Orders: No orders of the defined types were placed in this encounter.  No orders of the defined types were placed in this encounter.   Follow-Up Instructions: Return if symptoms worsen or fail to improve.   Collier Salina, MD  Note - This record has been created using [employer].  Chart creation errors have been sought, but may not always  have been located. Such creation errors do not reflect on  the standard of medical care.

## 2021-10-26 ENCOUNTER — Encounter: Payer: Self-pay | Admitting: Internal Medicine

## 2021-10-26 ENCOUNTER — Other Ambulatory Visit: Payer: Self-pay

## 2021-10-26 ENCOUNTER — Ambulatory Visit: Payer: Medicaid Other | Admitting: Internal Medicine

## 2021-10-26 VITALS — BP 112/81 | HR 69 | Ht 66.0 in | Wt 170.0 lb

## 2021-10-26 DIAGNOSIS — M7918 Myalgia, other site: Secondary | ICD-10-CM | POA: Diagnosis not present

## 2021-10-26 DIAGNOSIS — G8929 Other chronic pain: Secondary | ICD-10-CM | POA: Diagnosis not present

## 2021-10-26 DIAGNOSIS — K589 Irritable bowel syndrome without diarrhea: Secondary | ICD-10-CM

## 2021-10-26 DIAGNOSIS — M2550 Pain in unspecified joint: Secondary | ICD-10-CM

## 2021-10-26 DIAGNOSIS — R519 Headache, unspecified: Secondary | ICD-10-CM

## 2021-10-26 DIAGNOSIS — N040 Nephrotic syndrome with minor glomerular abnormality: Secondary | ICD-10-CM

## 2021-10-26 DIAGNOSIS — N050 Unspecified nephritic syndrome with minor glomerular abnormality: Secondary | ICD-10-CM

## 2021-10-26 DIAGNOSIS — R42 Dizziness and giddiness: Secondary | ICD-10-CM | POA: Diagnosis not present

## 2021-10-26 NOTE — Patient Instructions (Addendum)
I do not see any concerning joint changes for degenerative or inflammatory arthritis at this time. I agree with PRN use of low dose NSAIDs such as naproxen can be helpful for this but would not take this type of medicine every day as it can affect kidney function   I recommend checking out the [facility] patient-centered guide for fibromyalgia and chronic pain management: https://www.[email or web address]-oconnell.com/ If symptoms are not improving or getting worse over time there are some medications that can be helpful or can often benefit with physical or integrative therapy approaches as well.  Myofascial Pain Syndrome and Fibromyalgia Myofascial pain syndrome and fibromyalgia are both pain disorders. This pain may be felt mainly in your muscles. Myofascial pain syndrome: Always has tender points in the muscle that will cause pain when pressed (trigger points). The pain may come and go. Usually affects your neck, upper back, and shoulder areas. The pain often radiates into your arms and hands. Fibromyalgia: Has muscle pains and tenderness that come and go. Is often associated with fatigue and sleep problems. Has trigger points. Tends to be long-lasting (chronic), but is not life-threatening. Fibromyalgia and myofascial pain syndrome are not the same. However, they often occur together. If you have both conditions, each can make the other worse. Both are common and can cause enough pain and fatigue to make day-to-day activities difficult. Both can be hard to diagnose because their symptoms are common in many other conditions. What are the causes? The exact causes of these conditions are not known. What increases the risk? You are more likely to develop this condition if: You have a family history of the condition. You have certain triggers, such as: Spine disorders. An injury (trauma) or other physical stressors. Being under a lot of stress. Medical conditions such as osteoarthritis, rheumatoid  arthritis, or lupus. What are the signs or symptoms? Fibromyalgia The main symptom of fibromyalgia is widespread pain and tenderness in your muscles. Pain is sometimes described as stabbing, shooting, or burning. You may also have: Tingling or numbness. Sleep problems and fatigue. Problems with attention and concentration (fibro fog). Other symptoms may include:  Bowel and bladder problems. Headaches. Visual problems. Problems with odors and noises. Depression or mood changes. Painful menstrual periods (dysmenorrhea). Dry skin or eyes. These symptoms can vary over time. Myofascial pain syndrome Symptoms of myofascial pain syndrome include: Tight, ropy bands of muscle. Uncomfortable sensations in muscle areas. These may include aching, cramping, burning, numbness, tingling, and weakness. Difficulty moving certain parts of the body freely (poor range of motion). How is this diagnosed? This condition may be diagnosed by your symptoms and medical history. You will also have a physical exam. In general: Fibromyalgia is diagnosed if you have pain, fatigue, and other symptoms for more than 3 months, and symptoms cannot be explained by another condition. Myofascial pain syndrome is diagnosed if you have trigger points in your muscles, and those trigger points are tender and cause pain elsewhere in your body (referred pain). How is this treated? Treatment for these conditions depends on the type that you have. For fibromyalgia: Pain medicines, such as NSAIDs. Medicines for treating depression. Medicines for treating seizures. Medicines that relax the muscles. For myofascial pain: Pain medicines, such as NSAIDs. Cooling and stretching of muscles. Trigger point injections. Sound wave (ultrasound) treatments to stimulate muscles. Treating these conditions often requires a team of health care providers. These may include: Your primary care provider. Physical therapist. Complementary  health care providers, such as massage therapists  or acupuncturists. Psychiatrist for cognitive behavioral therapy. Follow these instructions at home: Medicines Take over-the-counter and prescription medicines only as told by your health care provider. Do not drive or use heavy machinery while taking prescription pain medicine. If you are taking prescription pain medicine, take actions to prevent or treat constipation. Your health care provider may recommend that you: Drink enough fluid to keep your urine pale yellow. Eat foods that are high in fiber, such as fresh fruits and vegetables, whole grains, and beans. Limit foods that are high in fat and processed sugars, such as fried or sweet foods. Take an over-the-counter or prescription medicine for constipation. Lifestyle  Exercise as directed by your health care provider or physical therapist. Practice relaxation techniques to control your stress. You may want to try: Biofeedback. Visual imagery. Hypnosis. Muscle relaxation. Yoga. Meditation. Maintain a healthy lifestyle. This includes eating a healthy diet and getting enough sleep. Do not use any products that contain nicotine or tobacco, such as cigarettes and e-cigarettes. If you need help quitting, ask your health care provider. General instructions Talk to your health care provider about complementary treatments, such as acupuncture or massage. Consider joining a support group with others who are diagnosed with this condition. Do not do activities that stress or strain your muscles. This includes repetitive motions and heavy lifting. Keep all follow-up visits as told by your health care provider. This is important. Where to find more information National Fibromyalgia Association: www.fmaware.[place]: www.arthritis.org American Chronic Pain Association: www.theacpa.org Contact a health care provider if: You have new symptoms. Your symptoms get worse or your  pain is severe. You have side effects from your medicines. You have trouble sleeping. Your condition is causing depression or anxiety. Summary Myofascial pain syndrome and fibromyalgia are pain disorders. Myofascial pain syndrome has tender points in the muscle that will cause pain when pressed (trigger points). Fibromyalgia also has muscle pains and tenderness that come and go, but this condition is often associated with fatigue and sleep disturbances. Fibromyalgia and myofascial pain syndrome are not the same but often occur together, causing pain and fatigue that make day-to-day activities difficult. Treatment for fibromyalgia includes taking medicines to relax the muscles and medicines for pain, depression, or seizures. Treatment for myofascial pain syndrome includes taking medicines for pain, cooling and stretching of muscles, and injecting medicines into trigger points. Follow your health care provider's instructions for taking medicines and maintaining a healthy lifestyle.   Naproxen Immediate-Release Tablets What is this medication? NAPROXEN (na PROX en) treats mild to moderate pain, inflammation, and arthritis. It belongs to a group of medications called NSAIDS. This medicine may be used for other purposes; ask your health care provider or pharmacist if you have questions. COMMON BRAND NAME(S): Aflaxen, Aleve, Aleve Arthritis, All Day Pain Relief, All Day Relief, Anaprox, Anaprox DS, Naprosyn, [facility] Naproxen Sodium What should I tell my care team before I take this medication? They need to know if you have any of these conditions: Asthma (lung or breathing disease) Bleeding disorder Coronary artery bypass graft (CABG) within the past 2 weeks Heart attack Heart disease Heart failure High blood pressure High levels of potassium in the blood If you often drink alcohol Kidney disease Liver disease Low red blood cell counts Smoke tobacco cigarettes Stomach bleeding Stomach or  intestine problems Take medications that treat or prevent blood clots Taking steroids such as dexamethasone or prednisone An unusual or allergic reaction to naproxen, other medications, foods, dyes, or preservatives Pregnant or trying  to get pregnant Breast-feeding How should I use this medication? Take this medication by mouth with water. Take it as directed on the prescription label at the same time every day. Do not cut, crush or chew this medication. Swallow the tablets whole. You can take it with or without food. If it upsets your stomach, take it with food. Keep taking it unless your care team tells you to stop. A special MedGuide will be given to you by the pharmacist with each prescription and refill. Be sure to read this information carefully each time. Talk to your care team about the use of this medication in children. While it may be prescribed for to children for selected conditions, precautions do apply. Overdosage: If you think you have taken too much of this medicine contact a poison control center or emergency room at once. NOTE: This medicine is only for you. Do not share this medicine with others. What if I miss a dose? If you miss a dose, take it as soon as you can. If it is almost time for your next dose, take only that dose. Do not take double or extra doses. What may interact with this medication? Alcohol Aspirin Cidofovir Diuretics Lithium Methotrexate Other medications for inflammation like ketorolac or prednisone Pemetrexed Probenecid Warfarin This list may not describe all possible interactions. Give your health care provider a list of all the medicines, herbs, non-prescription drugs, or dietary supplements you use. Also tell them if you smoke, drink alcohol, or use illegal drugs. Some items may interact with your medicine. What should I watch for while using this medication? Visit your care team for regular checks on your progress. Tell your care team if your  symptoms do not start to get better or if they get worse. Do not take other medications that contain aspirin, ibuprofen, or naproxen with this medication. Side effects such as stomach upset, nausea, or ulcers may be more likely to occur. Many non-prescription medications contain aspirin, ibuprofen, or naproxen. Always read labels carefully. This medication can cause serious ulcers and bleeding in the stomach. It can happen with no warning. Smoking, drinking alcohol, older age, and poor health can also increase risks. Call your care team right away if you have stomach pain or blood in your vomit or stool. This medication does not prevent a heart attack or stroke. This medication may increase the chance of a heart attack or stroke. The chance may increase the longer you use this medication or if you have heart disease. If you take aspirin to prevent a heart attack or stroke, talk to your care team about using this medication. Alcohol may interfere with the effect of this medication. Avoid alcoholic drinks. This medication may cause serious skin reactions. They can happen weeks to months after starting the medication. Contact your care team right away if you notice fevers or flu-like symptoms with a rash. The rash may be red or purple and then turn into blisters or peeling of the skin. Or, you might notice a red rash with swelling of the face, lips or lymph nodes in your neck or under your arms. Talk to your care team if you are pregnant before taking this medication. Taking this medication between weeks 20 and 30 of pregnancy may harm your unborn baby. Your care team will monitor you closely if you need to take it. After 30 weeks of pregnancy, do not take this medication. You may get drowsy or dizzy. Do not drive, use machinery, or do anything  that needs mental alertness until you know how this medication affects you. Do not stand up or sit up quickly, especially if you are an older patient. This reduces the  risk of dizzy or fainting spells. Be careful brushing or flossing your teeth or using a toothpick because you may get an infection or bleed more easily. If you have any dental work done, tell your dentist you are receiving this medication. This medication may make it more difficult to get pregnant. Talk to your care team if you are concerned about your fertility. What side effects may I notice from receiving this medication? Side effects that you should report to your care team as soon as possible: Allergic reactions--skin rash, itching, hives, swelling of the face, lips, tongue, or throat Bleeding--bloody or [place], tar-like stools, vomiting blood or brown material that looks like coffee grounds, red or dark brown urine, small red or purple spots on skin, unusual bruising or bleeding Heart attack--pain or tightness in the chest, shoulders, arms, or jaw, nausea, shortness of breath, cold or clammy skin, feeling faint or lightheaded Heart failure--shortness of breath, swelling of the ankles, feet, or hands, sudden weight gain, unusual weakness or fatigue Increase in blood pressure Kidney injury--decrease in the amount of urine, swelling of the ankles, hands, or feet Liver injury--right upper belly pain, loss of appetite, nausea, light-colored stool, dark yellow or brown urine, yellowing skin or eyes, unusual weakness, fatigue Rash, fever, and swollen lymph nodes Redness, blistering, peeling, or loosening of the skin, including inside the mouth Stroke--sudden numbness or weakness of the face, arm, or leg, trouble speaking, confusion, trouble walking, loss of balance or coordination, dizziness, severe headache, change in vision Side effects that usually do not require medical attention (report to your care team if they continue or are bothersome): Headache Loss of appetite Nausea Upset stomach This list may not describe all possible side effects. Call your doctor for medical advice about side  effects. You may report side effects to FDA at 1-800-FDA-1088. Where should I keep my medication? Keep out of the reach of children and pets. Store at room temperature between 20 and 25 degrees C (68 and 77 degrees F). Protect from moisture. Keep the container tightly closed. Avoid exposure to extreme heat. Get rid of any unused medication after the expiration date. To get rid of medications that are no longer needed or have expired: Take the medication to a medication take-back program. Check with your pharmacy or law enforcement to find a location. If you cannot return the medication, check the label or package insert to see if the medication should be thrown out in the garbage or flushed down the toilet. If you are not sure, ask your care team. If it is safe to put it in the trash, empty the medication out of the container. Mix the medication with cat litter, dirt, coffee grounds, or other unwanted substance. Seal the mixture in a bag or container. Put it in the trash.

## 2021-10-28 ENCOUNTER — Ambulatory Visit (INDEPENDENT_AMBULATORY_CARE_PROVIDER_SITE_OTHER): Payer: Medicaid Other | Admitting: Psychology

## 2021-10-28 DIAGNOSIS — F411 Generalized anxiety disorder: Secondary | ICD-10-CM

## 2021-11-01 ENCOUNTER — Ambulatory Visit (INDEPENDENT_AMBULATORY_CARE_PROVIDER_SITE_OTHER): Payer: Medicaid Other | Admitting: Psychology

## 2021-11-01 DIAGNOSIS — F411 Generalized anxiety disorder: Secondary | ICD-10-CM | POA: Diagnosis not present

## 2021-11-10 ENCOUNTER — Ambulatory Visit: Payer: Medicaid Other | Admitting: Psychology

## 2021-11-15 ENCOUNTER — Ambulatory Visit: Payer: Medicaid Other | Admitting: Internal Medicine

## 2021-11-22 ENCOUNTER — Encounter: Payer: Self-pay | Admitting: Family Medicine

## 2021-11-30 ENCOUNTER — Other Ambulatory Visit: Payer: Self-pay | Admitting: Family Medicine

## 2021-12-01 NOTE — Telephone Encounter (Signed)
F/u for anxiety was on 07/26/21, last filled on 09/24/21 #90 caps with 0 refill

## 2021-12-15 ENCOUNTER — Ambulatory Visit (INDEPENDENT_AMBULATORY_CARE_PROVIDER_SITE_OTHER): Payer: Medicaid Other | Admitting: Psychology

## 2021-12-15 DIAGNOSIS — F411 Generalized anxiety disorder: Secondary | ICD-10-CM | POA: Diagnosis not present

## 2021-12-15 NOTE — Progress Notes (Signed)
[facility] Counselor/Therapist Progress Note  Patient ID: Christine Rice, MRN: [id number]    Date: 12/15/21  Time Spent: 10:04  am - 10:54 am : 50 Minutes  Treatment Type: Individual Therapy.  Reported Symptoms: anxiety  Mental Status Exam: Appearance:  Neat     Behavior: Appropriate  Motor: Normal  Speech/Language:  Clear and Coherent  Affect: Congruent  Mood: anxious  Thought process: normal  Thought content:   WNL  Sensory/Perceptual disturbances:   WNL  Orientation: oriented to person, place, time/date, and situation  Attention: Good  Concentration: Good  Memory: WNL  Fund of knowledge:  Good  Insight:   Good  Judgment:  Good  Impulse Control: Good   Risk Assessment: Danger to Self:  No Self-injurious Behavior: No Danger to Others: No Duty to Warn:no Physical Aggression / Violence:No  Access to Firearms a concern: No  Gang Involvement:No   Subjective:   Christine Rice participated from home, via video, and consented to treatment. Therapist participated from home office. We met online due to [place] pandemic. Christine Rice reviewed the events of the past week. Christine Rice noted significant anxiety and rumination regarding the prospect of the job in relation to expunged records. We explored this during the session and her ineffectual attempts to manage.  Therapist reviewed tenets of CBT including challenging negative self talk and negative automatic thoughts.  Therapist modeled this during session using evidence.  Therapist encouraged Christine Rice to identify worries and use journaling as a method, within time limits, to delineate her worry and identify areas of control to work through.  Therapist provided psychoeducation regarding ACT.  Therapist modeled this during the session.  Additionally, Christine Rice endorsed weight-gain, increased physiological tension, and difficulty with sleep due to back pain.  We worked on identifying possible behaviors that could address her  concerns including mindfulness, exercise, dynamic stretching, and a consult with her doctor regarding her pain levels.  Christine Rice was engaged and motivated during the session.  She expressed commitment towards her goals.  Therapist validated and normalized Arion's feelings and provided supportive therapy.    Interventions: Cognitive Behavioral Therapy  Diagnosis:  Generalized anxiety disorder  Treatment Plan:  Client Abilities/Strengths Genola is intelligent, forthcoming, and motivated for change.   Support System: Family and friends.   Client Treatment Preferences OPT  Client Statement of Needs She discussed her goals for treatment including managing her symptoms, manage her reactions to stress, advocate more for self, and processing past events.   Treatment Level Weekly  Symptoms   Anxiety: History of panic attacks (~1.5 years ago), muscle tension, feeling nervous and on-edge, difficulty managing worry, difficulty relaxing, restlessness, irritability, feeling something awful might happen.   (Status: maintained) Depression: feeling down, lethargy, difficulty concentrating, difficulty with sleep (infrequent rumination). (Status: maintained)  Goals:  Phylisha experiences symptoms of anxiety w/depression.    Target Date: 08/20/2022 Frequency: Weekly  Progress: 0 Modality: individual    Therapist will provide referrals for additional resources as appropriate.  Therapist will provide psycho-education regarding Catlin's diagnosis and corresponding treatment approaches and interventions. Licensed Clinical Social Worker, [place], LCSW will support the patient's ability to achieve the goals identified. will employ CBT, BA, Problem-solving, Solution Focused, Mindfulness,  coping skills, & other evidenced-based practices will be used to promote progress towards healthy functioning to help manage decrease symptoms associated with her diagnosis.   Reduce overall level, frequency,  and intensity of the feelings of depression, anxiety and panic evidenced by decreased overall symptoms from 6 to 7 days/week  to 0 to 1 days/week per client report for at least 3 consecutive months. Verbally express understanding of the relationship between feelings of depression, anxiety and their impact on thinking patterns and behaviors. Verbalize an understanding of the role that distorted thinking plays in creating fears, excessive worry, and ruminations.  Christine Rice participated in the creation of the treatment plan)   [name], LCSW

## 2021-12-17 ENCOUNTER — Encounter: Payer: Medicaid Other | Admitting: Obstetrics and Gynecology

## 2021-12-24 ENCOUNTER — Other Ambulatory Visit: Payer: Self-pay

## 2021-12-24 ENCOUNTER — Telehealth (INDEPENDENT_AMBULATORY_CARE_PROVIDER_SITE_OTHER): Payer: Medicaid Other | Admitting: Nurse Practitioner

## 2021-12-24 VITALS — Temp 97.2°F

## 2021-12-24 DIAGNOSIS — J019 Acute sinusitis, unspecified: Secondary | ICD-10-CM | POA: Insufficient documentation

## 2021-12-24 MED ORDER — DOXYCYCLINE HYCLATE 100 MG PO TABS: 100.0000 mg | ORAL_TABLET | Freq: Two times a day (BID) | ORAL | 0 refills | Status: AC

## 2021-12-24 NOTE — Assessment & Plan Note (Signed)
With length of symptoms and patient presentation clinic to treat for acute sinusitis.  Patient did test for COVID and it was negative.  Will write doxycycline 100 mg twice daily for 7 days.  Discussed with patient this is not safe during pregnancy although she has IUD if she is uncertain if she is taking pregnancy test before beginning medication.  Patient acknowledged.  Signs and symptoms reviewed as well as seek urgent or emergent health care.

## 2021-12-24 NOTE — Progress Notes (Signed)
Patient ID: Christine Rice, female    DOB: August 17, 1981, 41 y.o.   MRN: [id number]  Virtual visit completed through [employer], a video enabled telemedicine application. Due to national recommendations of social distancing due to COVID-19, a virtual visit is felt to be most appropriate for this patient at this time. Reviewed limitations, risks, security and privacy concerns of performing a virtual visit and the availability of in person appointments. I also reviewed that there may be a patient responsible charge related to this service. The patient agreed to proceed.   Patient location: home Provider location: [place] at [facility], office Persons participating in this virtual visit: patient, provider   If any vitals were documented, they were collected by patient at home unless specified below.    Temp (!) 97.2 F (36.2 C) Comment: per patient this morning   CC: Sinus issues Subjective:   HPI: Christine Rice is a 41 y.o. female presenting on 12/24/2021 for Sinus Problem (X 2 weeks, Soreness at her nose/between her eyes, runny nose, sore throat, headache. No fever. Covid test has not been done. )  Symptoms started approx 2 weeks ago No covid vaccines At home covid test today was negative Symptoms improved and then returned worse yesterday No sick contacts.  LMP: IUD, does not have period on IUD  Relevant past medical, surgical, family and social history reviewed and updated as indicated. Interim medical history since our last visit reviewed. Allergies and medications reviewed and updated. Outpatient Medications Prior to Visit  Medication Sig Dispense Refill   ALPRAZolam (XANAX) 0.25 MG tablet TAKE 1 TABLET BY MOUTH DAILY AS NEEDED FOR ANXIETY 30 tablet 0   ASPIRIN 81 PO Take 81 mg by mouth daily.     fluticasone (FLONASE) 50 MCG/ACT nasal spray Place 2 sprays into both nostrils daily. (Patient taking differently: Place 2 sprays into both nostrils as needed.) 16 g 6   Glucosamine  750 MG TABS Take 1 tablet by mouth daily.     hydrOXYzine (VISTARIL) 25 MG capsule TAKE 1 CAPSULE BY MOUTH EVERY 8 HOURS ASNEEDED 90 capsule 3   levonorgestrel (MIRENA) 20 MCG/24HR IUD 1 each by Intrauterine route once.     Multiple Vitamin (MULTIVITAMIN) tablet Take 1 tablet by mouth daily.     losartan (COZAAR) 50 MG tablet TAKE ONE TABLET BY MOUTH EVERY DAY 30 tablet 5   No facility-administered medications prior to visit.     Per HPI unless specifically indicated in ROS section below Review of Systems  Constitutional:  Positive for fatigue. Negative for chills and fever.  HENT:  Positive for ear pain (full and pressure) and sinus pain. Negative for congestion, postnasal drip and sore throat.   Respiratory:  Negative for cough and shortness of breath.   Cardiovascular:  Negative for chest pain.  Gastrointestinal:  Negative for abdominal pain, diarrhea, nausea and vomiting.  Musculoskeletal:  Negative for arthralgias and myalgias.  Neurological:  Positive for headaches.  Objective:  Temp (!) 97.2 F (36.2 C) Comment: per patient this morning  Wt Readings from Last 3 Encounters:  10/26/21 170 lb (77.1 kg)  10/15/21 168 lb 2 oz (76.3 kg)  08/26/21 170 lb 6 oz (77.3 kg)       Physical exam: Gen: alert, NAD, not ill appearing Pulm: speaks in complete sentences without increased work of breathing Psych: normal mood, normal thought content      Results for orders placed or performed in visit on 07/01/21  Comprehensive metabolic panel  Result Value Ref Range   Sodium 138 135 - 145 mEq/L   Potassium 4.1 3.5 - 5.1 mEq/L   Chloride 104 96 - 112 mEq/L   CO2 26 19 - 32 mEq/L   Glucose, Bld 86 70 - 99 mg/dL   BUN 8 6 - 23 mg/dL   Creatinine, Ser 0.77 0.40 - 1.20 mg/dL   Total Bilirubin 0.7 0.2 - 1.2 mg/dL   Alkaline Phosphatase 52 39 - 117 U/L   AST 15 0 - 37 U/L   ALT 12 0 - 35 U/L   Total Protein 6.9 6.0 - 8.3 g/dL   Albumin 4.4 3.5 - 5.2 g/dL   GFR 96.66 >60.00 mL/min    Calcium 9.1 8.4 - 10.5 mg/dL  CBC with Differential/Platelet  Result Value Ref Range   WBC 12.1 (H) 4.0 - 10.5 K/uL   RBC 4.75 3.87 - 5.11 Mil/uL   Hemoglobin 14.9 12.0 - 15.0 g/dL   HCT 43.5 36.0 - 46.0 %   MCV 91.5 78.0 - 100.0 fl   MCHC 34.2 30.0 - 36.0 g/dL   RDW 13.0 11.5 - 15.5 %   Platelets 205.0 150.0 - 400.0 K/uL   Neutrophils Relative % 69.6 43.0 - 77.0 %   Lymphocytes Relative 21.9 12.0 - 46.0 %   Monocytes Relative 5.5 3.0 - 12.0 %   Eosinophils Relative 2.4 0.0 - 5.0 %   Basophils Relative 0.6 0.0 - 3.0 %   Neutro Abs 8.4 (H) 1.4 - 7.7 K/uL   Lymphs Abs 2.6 0.7 - 4.0 K/uL   Monocytes Absolute 0.7 0.1 - 1.0 K/uL   Eosinophils Absolute 0.3 0.0 - 0.7 K/uL   Basophils Absolute 0.1 0.0 - 0.1 K/uL  TSH  Result Value Ref Range   TSH 1.51 0.35 - 5.50 uIU/mL  T4, free  Result Value Ref Range   Free T4 0.79 0.60 - 1.60 ng/dL  T3, free  Result Value Ref Range   T3, Free 3.8 2.3 - 4.2 pg/mL   Assessment & Plan:   Problem List Items Addressed This Visit       Respiratory   Acute non-recurrent sinusitis - Primary    With length of symptoms and patient presentation clinic to treat for acute sinusitis.  Patient did test for COVID and it was negative.  Will write doxycycline 100 mg twice daily for 7 days.  Discussed with patient this is not safe during pregnancy although she has IUD if she is uncertain if she is taking pregnancy test before beginning medication.  Patient acknowledged.  Signs and symptoms reviewed as well as seek urgent or emergent health care.      Relevant Medications   doxycycline (VIBRA-TABS) 100 MG tablet    No orders of the defined types were placed in this encounter.  No orders of the defined types were placed in this encounter.   I discussed the assessment and treatment plan with the patient. The patient was provided an opportunity to ask questions and all were answered. The patient agreed with the plan and demonstrated an understanding of the  instructions. The patient was advised to call back or seek an in-person evaluation if the symptoms worsen or if the condition fails to improve as anticipated.  Follow up plan: No follow-ups on file.  Romilda Garret, NP

## 2022-01-05 ENCOUNTER — Ambulatory Visit (INDEPENDENT_AMBULATORY_CARE_PROVIDER_SITE_OTHER): Payer: Medicaid Other | Admitting: Psychology

## 2022-01-05 DIAGNOSIS — F411 Generalized anxiety disorder: Secondary | ICD-10-CM | POA: Diagnosis not present

## 2022-01-05 NOTE — Progress Notes (Signed)
[facility] Counselor/Therapist Progress Note  Patient ID: Christine Rice, MRN: [id number]    Date: 01/05/22  Time Spent: 9:05  am -  9:56 am : 51 Minutes  Treatment Type: Individual Therapy.  Reported Symptoms: anxiety  Mental Status Exam: Appearance:  Neat     Behavior: Appropriate  Motor: Normal  Speech/Language:  Clear and Coherent  Affect: Congruent  Mood: anxious  Thought process: normal  Thought content:   WNL  Sensory/Perceptual disturbances:   WNL  Orientation: oriented to person, place, time/date, and situation  Attention: Good  Concentration: Good  Memory: WNL  Fund of knowledge:  Good  Insight:   Good  Judgment:  Good  Impulse Control: Good   Risk Assessment: Danger to Self:  No Self-injurious Behavior: No Danger to Others: No Duty to Warn:no Physical Aggression / Violence:No  Access to Firearms a concern: No  Gang Involvement:No   Subjective:   Christine Rice participated from home, via video, and consented to treatment. Therapist participated from home office. We met online due to [place] pandemic. Christine Rice reviewed the events of the past week. Christine Rice noted recent stress, with her mother, due to a conversation regarding previous Christmas. We explored this, during the session, and both her mother and her own behavior. We worked on processing past dynamics, during the session, and the effect of this on their dynamics.  We worked on processing her approach during most recent conversation identifying ways to address concerns going forward using rescripting and rehearsing.  Therapist reviewed direct, assertive, clear, and positive communication and modeled this during the session.  Christine Rice was engaged and motivated and expressed her commitment towards her goals.  Therapist praised Christine Rice for her flexibility and willingness to approach conversations with her mother with a different approach.  Therapist validated and normalized Christine Rice's feelings and  provided supportive therapy.  Interventions: Interpersonal  Diagnosis:  Generalized anxiety disorder  Treatment Plan:  Client Abilities/Strengths Christine Rice is intelligent, forthcoming, and motivated for change.   Support System: Family and friends.   Client Treatment Preferences OPT  Client Statement of Needs She discussed her goals for treatment including managing her symptoms, manage her reactions to stress, advocate more for self, and processing past events.   Treatment Level Weekly  Symptoms   Anxiety: History of panic attacks (~1.5 years ago), muscle tension, feeling nervous and on-edge, difficulty managing worry, difficulty relaxing, restlessness, irritability, feeling something awful might happen.   (Status: maintained) Depression: feeling down, lethargy, difficulty concentrating, difficulty with sleep (infrequent rumination). (Status: maintained)  Goals:  Christine Rice experiences symptoms of anxiety w/depression.    Target Date: 08/20/2022 Frequency: Weekly  Progress: 0 Modality: individual    Therapist will provide referrals for additional resources as appropriate.  Therapist will provide psycho-education regarding Christine Rice's diagnosis and corresponding treatment approaches and interventions. Licensed Clinical Social Worker, [place], LCSW will support the patient's ability to achieve the goals identified. will employ CBT, BA, Problem-solving, Solution Focused, Mindfulness,  coping skills, & other evidenced-based practices will be used to promote progress towards healthy functioning to help manage decrease symptoms associated with her diagnosis.   Reduce overall level, frequency, and intensity of the feelings of depression, anxiety and panic evidenced by decreased overall symptoms from 6 to 7 days/week to 0 to 1 days/week per client report for at least 3 consecutive months. Verbally express understanding of the relationship between feelings of depression, anxiety and  their impact on thinking patterns and behaviors. Verbalize an understanding of the role that distorted  thinking plays in creating fears, excessive worry, and ruminations.  Christine Rice participated in the creation of the treatment plan)   [name], LCSW                  [name], LCSW

## 2022-01-19 ENCOUNTER — Ambulatory Visit (INDEPENDENT_AMBULATORY_CARE_PROVIDER_SITE_OTHER): Payer: Medicaid Other | Admitting: Psychology

## 2022-01-19 DIAGNOSIS — F411 Generalized anxiety disorder: Secondary | ICD-10-CM | POA: Diagnosis not present

## 2022-01-19 NOTE — Progress Notes (Signed)
[facility] Counselor/Therapist Progress Note  Patient ID: Christine Rice, MRN: [id number]    Date: 01/19/22  Time Spent: 11:09 am -  12:02 pm : 53 Minutes  Treatment Type: Individual Therapy.  Reported Symptoms: anxiety  Mental Status Exam: Appearance:  Neat     Behavior: Appropriate  Motor: Normal  Speech/Language:  Clear and Coherent  Affect: Congruent  Mood: anxious  Thought process: normal  Thought content:   WNL  Sensory/Perceptual disturbances:   WNL  Orientation: oriented to person, place, time/date, and situation  Attention: Good  Concentration: Good  Memory: WNL  Fund of knowledge:  Good  Insight:   Good  Judgment:  Good  Impulse Control: Good   Risk Assessment: Danger to Self:  No Self-injurious Behavior: No Danger to Others: No Duty to Warn:no Physical Aggression / Violence:No  Access to Firearms a concern: No  Gang Involvement:No   Subjective:   NATASIA SANKO participated from home, via video, and consented to treatment. Therapist participated from home office. We met online due to [place] pandemic. Jourdin reviewed the events of the past week. Jeraldin noted rumination regarding minor negative interactions she experiences. "Why am I a magnet for it?" Savilla noted a fear of providing feedback due to "seeing red". She noted being limited by what-ifs. We explored her self-talk and the effect of this on her overall mood. We worked on delineating her concerns and processing her feelings. Therapist provided psycho-education regarding anxiety, depression, and the effects on mood, self-talk, and perception. Therapist discussed ways to manage negative self-talk and modeled this during the session. Rachyl noted her attempts to engagement in positive experiences. Psycho-education regarding anxiety, the effects of rumination, and ways to manage this via worry time. We worked on challenging distortions during the session. Adriella was engaged and motivated and  expressed her commitment towards her goals. Therapist validated and normalized Brylin's feelings and provided supportive therapy.  Interventions: Interpersonal  Diagnosis:  Generalized anxiety disorder  Treatment Plan:  Client Abilities/Strengths Jesiah is intelligent, forthcoming, and motivated for change.   Support System: Family and friends.   Client Treatment Preferences OPT  Client Statement of Needs She discussed her goals for treatment including managing her symptoms, manage her reactions to stress, advocate more for self, and processing past events.   Treatment Level Weekly  Symptoms   Anxiety: History of panic attacks (~1.5 years ago), muscle tension, feeling nervous and on-edge, difficulty managing worry, difficulty relaxing, restlessness, irritability, feeling something awful might happen.   (Status: maintained) Depression: feeling down, lethargy, difficulty concentrating, difficulty with sleep (infrequent rumination). (Status: maintained)  Goals:  Vietta experiences symptoms of anxiety w/depression.    Target Date: 08/20/2022 Frequency: Weekly  Progress: 0 Modality: individual    Therapist will provide referrals for additional resources as appropriate.  Therapist will provide psycho-education regarding Rochell's diagnosis and corresponding treatment approaches and interventions. Licensed Clinical Social Worker, [place], LCSW will support the patient's ability to achieve the goals identified. will employ CBT, BA, Problem-solving, Solution Focused, Mindfulness,  coping skills, & other evidenced-based practices will be used to promote progress towards healthy functioning to help manage decrease symptoms associated with her diagnosis.   Reduce overall level, frequency, and intensity of the feelings of depression, anxiety and panic evidenced by decreased overall symptoms from 6 to 7 days/week to 0 to 1 days/week per client report for at least 3 consecutive  months. Verbally express understanding of the relationship between feelings of depression, anxiety and their impact on thinking  patterns and behaviors. Verbalize an understanding of the role that distorted thinking plays in creating fears, excessive worry, and ruminations.  [name] participated in the creation of the treatment plan)   [name], LCSW

## 2022-01-21 ENCOUNTER — Encounter: Payer: Self-pay | Admitting: Obstetrics and Gynecology

## 2022-01-21 ENCOUNTER — Other Ambulatory Visit: Payer: Self-pay

## 2022-01-21 ENCOUNTER — Ambulatory Visit (INDEPENDENT_AMBULATORY_CARE_PROVIDER_SITE_OTHER): Payer: Medicaid Other | Admitting: Obstetrics and Gynecology

## 2022-01-21 VITALS — BP 108/86 | HR 89 | Ht 66.0 in | Wt 173.0 lb

## 2022-01-21 DIAGNOSIS — Z975 Presence of (intrauterine) contraceptive device: Secondary | ICD-10-CM | POA: Diagnosis not present

## 2022-01-21 DIAGNOSIS — F413 Other mixed anxiety disorders: Secondary | ICD-10-CM

## 2022-01-21 DIAGNOSIS — Z01419 Encounter for gynecological examination (general) (routine) without abnormal findings: Secondary | ICD-10-CM | POA: Diagnosis not present

## 2022-01-21 DIAGNOSIS — Z1231 Encounter for screening mammogram for malignant neoplasm of breast: Secondary | ICD-10-CM | POA: Diagnosis not present

## 2022-01-21 NOTE — Progress Notes (Addendum)
GYNECOLOGY ANNUAL PHYSICAL EXAM PROGRESS NOTE  Subjective:    Christine Rice is a 41 y.o. G29P2012 female who presents for an annual exam. The patient is sexually active. The patient participates in regular exercise: no. Has the patient ever been transfused or tattooed?: no. The patient reports that there is not domestic violence in her life.    The patient has the following concerns today:  Reports that she is still having some anxiety. Is seeing a Social worker.   Notes that she was diagnosed with fibromyalgia this past year  Menstrual History: Menarche age: 40 No LMP recorded. (Menstrual status: IUD).   Gynecologic History:  Contraception: IUD (inserted 02/24/2020) History of STI's: Denies Last Pap: 08/18/2020. Results were: normal. Notes h/o abnormal pap smears. NILM bur HR HPV positive (type 16). Did not f/u with colposcopy due to loss of insurance. Last mammogram: Ordered first mammogram today.     [facility] - 01/21/22 0936       Pregnancy Intention Screening   Does the patient want to become pregnant in the next year? No    Does the patient's partner want to become pregnant in the next year? No    Would the patient like to discuss contraceptive options today? No      Contraception Wrap Up   Current Method IUD or IUS    End Method IUD or IUS    Contraception Counseling Provided No            The pregnancy intention screening data noted above was reviewed. Potential methods of contraception were discussed. The patient elected to proceed with IUD or IUS.   OB History  Gravida Para Term Preterm AB Living  3 2 2  0 1 2  SAB IAB Ectopic Multiple Live Births  0 1 0 0 2    # Outcome Date GA Lbr Len/2nd Weight Sex Delivery Anes PTL Lv  3 Term 01/19/12 [redacted]w[redacted]d  6 lb 9.6 oz (2.995 kg) F CS-LTranv EPI  LIV     Name: Lesnick,GIRL Lerline     Apgar1: 9  Apgar5: 9  2 Term 01/06/05    F CS-Unspec   LIV  1 IAB             Past Medical History:  Diagnosis Date   Anxiety     Kidney failure    Nephrotic syndrome     Past Surgical History:  Procedure Laterality Date   CESAREAN SECTION  01/06/2005   CESAREAN SECTION  01/19/2012   Procedure: CESAREAN SECTION;  Surgeon: Luz Lex, MD;  Location: [facility];  Service: Gynecology;  Laterality: N/A;  repeat   HERNIA REPAIR     INGUINAL HERNIA REPAIR  01/19/2012   Procedure: HERNIA REPAIR INGUINAL ADULT BILATERAL;  Surgeon: Adin Hector, MD;  Location: [facility];  Service: General;  Laterality: Bilateral;   RENAL BIOPSY, PERCUTANEOUS  01/06/2020       tummy tuck     UMBILICAL HERNIA REPAIR  01/19/2012   Procedure: HERNIA REPAIR UMBILICAL ADULT;  Surgeon: Adin Hector, MD;  Location: [facility];  Service: General;  Laterality: N/A;   UMBILICAL HERNIA REPAIR  11/19/2014   WISDOM TOOTH EXTRACTION      Family History  Problem Relation Age of Onset   Rashes / Skin problems Mother    COPD Father    Stroke Father    Healthy Daughter    Healthy Daughter    Breast cancer Neg Hx    Cancer Neg  Hx     Social History   Socioeconomic History   Marital status: Divorced    Spouse name: Not on file   Number of children: 2   Years of education: Not on file   Highest education level: Not on file  Occupational History   Occupation: Product manager: [employer]   Occupation: Catering--part time  Tobacco Use   Smoking status: Former    Types: Cigarettes    Quit date: 11/15/2019    Years since quitting: 2.1   Smokeless tobacco: Never  Vaping Use   Vaping Use: Former  Substance and Sexual Activity   Alcohol use: Yes    Comment: rarely   Drug use: Yes    Types: Marijuana    Comment: occ- marijuana    Sexual activity: Yes    Partners: Male    Birth control/protection: I.U.D.  Other Topics Concern   Not on file  Social History Narrative   Not on file   Social Determinants of Health   Financial Resource Strain: Not on file  Food Insecurity: Not on file  Transportation Needs: Not on file   Physical Activity: Not on file  Stress: Not on file  Social Connections: Not on file  Intimate Partner Violence: Not on file    Current Outpatient Medications on File Prior to Visit  Medication Sig Dispense Refill   ALPRAZolam (XANAX) 0.25 MG tablet TAKE 1 TABLET BY MOUTH DAILY AS NEEDED FOR ANXIETY 30 tablet 0   ASPIRIN 81 PO Take 81 mg by mouth daily.     fluticasone (FLONASE) 50 MCG/ACT nasal spray Place 2 sprays into both nostrils daily. (Patient taking differently: Place 2 sprays into both nostrils as needed.) 16 g 6   Glucosamine 750 MG TABS Take 1 tablet by mouth daily.     hydrOXYzine (VISTARIL) 25 MG capsule TAKE 1 CAPSULE BY MOUTH EVERY 8 HOURS ASNEEDED 90 capsule 3   levonorgestrel (MIRENA) 20 MCG/24HR IUD 1 each by Intrauterine route once.     losartan (COZAAR) 50 MG tablet TAKE ONE TABLET BY MOUTH EVERY DAY 30 tablet 5   Multiple Vitamin (MULTIVITAMIN) tablet Take 1 tablet by mouth daily.     No current facility-administered medications on file prior to visit.    Allergies  Allergen Reactions   Amoxicillin Hives    REACTION: rash  No associated shortness of breath or throat swelling   Penicillins Hives   Lipitor [Atorvastatin]     Aches and pains and fatigue     Review of Systems Constitutional: negative for chills, fatigue, fevers and sweats Eyes: negative for irritation, redness and visual disturbance Ears, nose, mouth, throat, and face: negative for hearing loss, nasal congestion, snoring and tinnitus Respiratory: negative for asthma, cough, sputum Cardiovascular: negative for chest pain, dyspnea, exertional chest pressure/discomfort, irregular heart beat, palpitations and syncope Gastrointestinal: negative for abdominal pain, change in bowel habits, nausea and vomiting Genitourinary: negative for abnormal menstrual periods, genital lesions, sexual problems and vaginal discharge, dysuria and urinary incontinence Integument/breast: negative for breast lump,  breast tenderness and nipple discharge Hematologic/lymphatic: negative for bleeding and easy bruising Musculoskeletal:negative for back pain and muscle weakness Neurological: negative for dizziness, headaches, vertigo and weakness Endocrine: negative for diabetic symptoms including polydipsia, polyuria and skin dryness Allergic/Immunologic: negative for hay fever and urticaria      Objective:  Blood pressure 108/86, pulse 89, height 5\' 6"  (1.676 m), weight 173 lb (78.5 kg), SpO2 98 %. Body mass index is 27.92  kg/m.  General Appearance:    Alert, cooperative, no distress, appears stated age, overweight  Head:    Normocephalic, without obvious abnormality, atraumatic  Eyes:    PERRL, conjunctiva/corneas clear, EOM's intact, both eyes  Ears:    Normal external ear canals, both ears  Nose:   Nares normal, septum midline, mucosa normal, no drainage or sinus tenderness  Throat:   Lips, mucosa, and tongue normal; teeth and gums normal  Neck:   Supple, symmetrical, trachea midline, no adenopathy; thyroid: no enlargement/tenderness/nodules; no carotid bruit or JVD  Back:     Symmetric, no curvature, ROM normal, no CVA tenderness  Lungs:     Clear to auscultation bilaterally, respirations unlabored  Chest Wall:    No tenderness or deformity   Heart:    Regular rate and rhythm, S1 and S2 normal, no murmur, rub or gallop  Breast Exam:    No tenderness, masses, or nipple abnormality  Abdomen:     Soft, non-tender, bowel sounds active all four quadrants, no masses, no organomegaly.    Genitalia:    Pelvic:external genitalia normal, vagina without lesions, discharge, or tenderness, rectovaginal septum  normal. Cervix normal in appearance, no cervical motion tenderness, no adnexal masses or tenderness.  Uterus normal size, shape, mobile, regular contours, nontender.  Rectal:    Normal external sphincter.  No hemorrhoids appreciated. Internal exam not done.   Extremities:   Extremities normal, atraumatic,  no cyanosis or edema  Pulses:   2+ and symmetric all extremities  Skin:   Skin color, texture, turgor normal, no rashes or lesions  Lymph nodes:   Cervical, supraclavicular, and axillary nodes normal  Neurologic:   CNII-XII intact, normal strength, sensation and reflexes throughout   .  Labs:  Lab Results  Component Value Date   WBC 12.1 (H) 07/01/2021   HGB 14.9 07/01/2021   HCT 43.5 07/01/2021   MCV 91.5 07/01/2021   PLT 205.0 07/01/2021    Lab Results  Component Value Date   CREATININE 0.77 07/01/2021   BUN 8 07/01/2021   NA 138 07/01/2021   K 4.1 07/01/2021   CL 104 07/01/2021   CO2 26 07/01/2021    Lab Results  Component Value Date   ALT 12 07/01/2021   AST 15 07/01/2021   ALKPHOS 52 07/01/2021   BILITOT 0.7 07/01/2021    Lab Results  Component Value Date   TSH 1.51 07/01/2021     Assessment:   1. Encounter for well woman exam with routine gynecological exam   2. Encounter for screening mammogram for malignant neoplasm of breast   3. Other mixed anxiety disorders   4. IUD (intrauterine device) in place      Plan:  Blood tests: none ordered.  Had labs with PCP, up to date. Breast self exam technique reviewed and patient encouraged to perform self-exam monthly. Contraception: IUD. Discussed healthy lifestyle modifications. Mammogram ordered Pap smear  UTD . COVID vaccination status: Declines Flu vaccine: Declines Anxiety, currently managed with counseling, using herbal teas.  Also discussed use of Ashwaganda. Uses Xanax rarely. Follow up in 1 year for annual exam  Rubie Maid, MD [facility]

## 2022-01-21 NOTE — Patient Instructions (Addendum)
Breast Self-Awareness Preventive Care 36-41 Years Old, Female Preventive care refers to lifestyle choices and visits with your health care provider that can promote health and wellness. Preventive care visits are also called wellness exams. What can I expect for my preventive care visit? Counseling Your health care provider may ask you questions about your: Medical history, including: Past medical problems. Family medical history. Pregnancy history. Current health, including: Menstrual cycle. Method of birth control. Emotional well-being. Home life and relationship well-being. Sexual activity and sexual health. Lifestyle, including: Alcohol, nicotine or tobacco, and drug use. Access to firearms. Diet, exercise, and sleep habits. Work and work Statistician. Sunscreen use. Safety issues such as seatbelt and bike helmet use. Physical exam Your health care provider will check your: Height and weight. These may be used to calculate your BMI (body mass index). BMI is a measurement that tells if you are at a healthy weight. Waist circumference. This measures the distance around your waistline. This measurement also tells if you are at a healthy weight and may help predict your risk of certain diseases, such as type 2 diabetes and high blood pressure. Heart rate and blood pressure. Body temperature. Skin for abnormal spots. What immunizations do I need? Vaccines are usually given at various ages, according to a schedule. Your health care provider will recommend vaccines for you based on your age, medical history, and lifestyle or other factors, such as travel or where you work. What tests do I need? Screening Your health care provider may recommend screening tests for certain conditions. This may include: Lipid and cholesterol levels. Diabetes screening. This is done by checking your blood sugar (glucose) after you have not eaten for a while (fasting). Pelvic exam and Pap test. Hepatitis  B test. Hepatitis C test. HIV (human immunodeficiency virus) test. STI (sexually transmitted infection) testing, if you are at risk. Lung cancer screening. Colorectal cancer screening. Mammogram. Talk with your health care provider about when you should start having regular mammograms. This may depend on whether you have a family history of breast cancer. BRCA-related cancer screening. This may be done if you have a family history of breast, ovarian, tubal, or peritoneal cancers. Bone density scan. This is done to screen for osteoporosis. Talk with your health care provider about your test results, treatment options, and if necessary, the need for more tests. Follow these instructions at home: Eating and drinking  Eat a diet that includes fresh fruits and vegetables, whole grains, lean protein, and low-fat dairy products. Take vitamin and mineral supplements as recommended by your health care provider. Do not drink alcohol if: Your health care provider tells you not to drink. You are pregnant, may be pregnant, or are planning to become pregnant. If you drink alcohol: Limit how much you have to 0-1 drink a day. Know how much alcohol is in your drink. In the [place] one drink equals one 12 oz bottle of beer (355 mL), one 5 oz glass of wine (148 mL), or one 1 oz glass of hard liquor (44 mL). Lifestyle Brush your teeth every morning and night with fluoride toothpaste. Floss one time each day. Exercise for at least 30 minutes 5 or more days each week. Do not use any products that contain nicotine or tobacco. These products include cigarettes, chewing tobacco, and vaping devices, such as e-cigarettes. If you need help quitting, ask your health care provider. Do not use drugs. If you are sexually active, practice safe sex. Use a condom or other form of protection  to prevent STIs. If you do not wish to become pregnant, use a form of birth control. If you plan to become pregnant, see your health  care provider for a prepregnancy visit. Take aspirin only as told by your health care provider. Make sure that you understand how much to take and what form to take. Work with your health care provider to find out whether it is safe and beneficial for you to take aspirin daily. Find healthy ways to manage stress, such as: Meditation, yoga, or listening to music. Journaling. Talking to a trusted person. Spending time with friends and family. Minimize exposure to UV radiation to reduce your risk of skin cancer. Safety Always wear your seat belt while driving or riding in a vehicle. Do not drive: If you have been drinking alcohol. Do not ride with someone who has been drinking. When you are tired or distracted. While texting. If you have been using any mind-altering substances or drugs. Wear a helmet and other protective equipment during sports activities. If you have firearms in your house, make sure you follow all gun safety procedures. Seek help if you have been physically or sexually abused. What's next? Visit your health care provider once a year for an annual wellness visit. Ask your health care provider how often you should have your eyes and teeth checked. Stay up to date on all vaccines. This information is not intended to replace advice given to you by your health care provider. Make sure you discuss any questions you have with your health care provider. Document Revised: 05/26/2021 Document Reviewed: 05/26/2021 Elsevier Patient Education  Christine Rice.

## 2022-02-02 ENCOUNTER — Ambulatory Visit (INDEPENDENT_AMBULATORY_CARE_PROVIDER_SITE_OTHER): Payer: Medicaid Other | Admitting: Psychology

## 2022-02-02 DIAGNOSIS — F411 Generalized anxiety disorder: Secondary | ICD-10-CM

## 2022-02-02 NOTE — Progress Notes (Signed)
[facility] Counselor/Therapist Progress Note  Patient ID: Christine Rice, MRN: [id number]    Date: 02/02/22  Time Spent: 1:36 pm -  2:25 pm : 49 Minutes  Treatment Type: Individual Therapy.  Reported Symptoms: anxiety  Mental Status Exam: Appearance:  Neat     Behavior: Appropriate  Motor: Normal  Speech/Language:  Clear and Coherent  Affect: Congruent  Mood: anxious  Thought process: normal  Thought content:   WNL  Sensory/Perceptual disturbances:   WNL  Orientation: oriented to person, place, time/date, and situation  Attention: Good  Concentration: Good  Memory: WNL  Fund of knowledge:  Good  Insight:   Good  Judgment:  Good  Impulse Control: Good   Risk Assessment: Danger to Self:  No Self-injurious Behavior: No Danger to Others: No Duty to Warn:no Physical Aggression / Violence:No  Access to Firearms a concern: No  Gang Involvement:No   Subjective:   Christine Rice participated from home, via video, and consented to treatment. Therapist participated from home office. We met online due to [place] pandemic. Daven reviewed the events of the past week. Christine Rice noted staying busy and active, and noted some weight-loss as a result. We continued to explore her relationship with her mother and the current strain.  Christine Rice continues to ruminate regarding this recent argument with her mother and discussed the need to resolve this.  We explored this during the session and reviewed Christine Rice's attempts to manage the stressors and communicate needs to mother.  Therapist provided feedback regarding communication styles and approaches.  Therapist provided psychoeducation regarding assertive and positive communication which were modeled during the session.  Therapist encouraged Christine Rice to identify the main points discussed, to manage her frustration during the conversation, and to seek to understand her mother's stance and opinion despite how she might feel about it.   Therapist encouraged Christine Rice to manage stressors prior to this discussion and to manage her overall anxiety as well as that could complicate communication.  Numerous examples were provided during the session.  Additionally therapist encouraged Christine Rice to manage rumination between sessions and to set limits regarding rumination when it is not productive and creating additional stress.  Christine Rice engaged motivated in session and expressed commitment towards her goals.  Therapist praised Christine Rice provided supportive therapy. Christine Rice scheduled for numerous follow-ups and continues to benefit from treatment.   Interventions: Interpersonal  Diagnosis:  Generalized anxiety disorder  Treatment Plan:  Client Abilities/Strengths Christine Rice is intelligent, forthcoming, and motivated for change.   Support System: Family and friends.   Client Treatment Preferences OPT  Client Statement of Needs She discussed her goals for treatment including managing her symptoms, manage her reactions to stress, advocate more for self, and processing past events.   Treatment Level Weekly  Symptoms   Anxiety: History of panic attacks (~1.5 years ago), muscle tension, feeling nervous and on-edge, difficulty managing worry, difficulty relaxing, restlessness, irritability, feeling something awful might happen.   (Status: maintained) Depression: feeling down, lethargy, difficulty concentrating, difficulty with sleep (infrequent rumination). (Status: maintained)  Goals:  Christine Rice experiences symptoms of anxiety w/depression.    Target Date: 08/20/2022 Frequency: Weekly  Progress: 0 Modality: individual    Therapist will provide referrals for additional resources as appropriate.  Therapist will provide psycho-education regarding Christine Rice's diagnosis and corresponding treatment approaches and interventions. Licensed Clinical Social Worker, [place], LCSW will support the patient's ability to achieve the goals  identified. will employ CBT, BA, Problem-solving, Solution Focused, Mindfulness,  coping skills, & other evidenced-based practices  will be used to promote progress towards healthy functioning to help manage decrease symptoms associated with her diagnosis.   Reduce overall level, frequency, and intensity of the feelings of depression, anxiety and panic evidenced by decreased overall symptoms from 6 to 7 days/week to 0 to 1 days/week per client report for at least 3 consecutive months. Verbally express understanding of the relationship between feelings of depression, anxiety and their impact on thinking patterns and behaviors. Verbalize an understanding of the role that distorted thinking plays in creating fears, excessive worry, and ruminations.  Christine Rice participated in the creation of the treatment plan)   [name], LCSW

## 2022-02-08 ENCOUNTER — Ambulatory Visit (INDEPENDENT_AMBULATORY_CARE_PROVIDER_SITE_OTHER): Payer: Medicaid Other | Admitting: Family Medicine

## 2022-02-08 ENCOUNTER — Encounter: Payer: Self-pay | Admitting: Family Medicine

## 2022-02-08 ENCOUNTER — Other Ambulatory Visit: Payer: Self-pay | Admitting: Family Medicine

## 2022-02-08 ENCOUNTER — Other Ambulatory Visit: Payer: Self-pay

## 2022-02-08 VITALS — BP 118/70 | HR 71 | Temp 97.5°F | Ht 65.75 in | Wt 175.0 lb

## 2022-02-08 DIAGNOSIS — F411 Generalized anxiety disorder: Secondary | ICD-10-CM | POA: Diagnosis not present

## 2022-02-08 DIAGNOSIS — L989 Disorder of the skin and subcutaneous tissue, unspecified: Secondary | ICD-10-CM | POA: Diagnosis not present

## 2022-02-08 DIAGNOSIS — R2241 Localized swelling, mass and lump, right lower limb: Secondary | ICD-10-CM | POA: Diagnosis not present

## 2022-02-08 NOTE — Assessment & Plan Note (Signed)
More anxiety off of hydroxyzine  Also scalp itching-suspect this is from anxiety or poss allergies   Recommend re start hydroxyzine Do not think this adds to weight at all  Enc her to continue counseling as well

## 2022-02-08 NOTE — Patient Instructions (Signed)
Take care of yourself   Please wear sunscreen/use sun protection   I placed a referral for dermatology If you do not get a call in 2 weeks let us know   I think the lump on your leg may be a lipoma   Scalp looks ok Get back on the hydroxyzine

## 2022-02-08 NOTE — Progress Notes (Signed)
Subjective:    Patient ID: Christine Rice, female    DOB: 19-Mar-1981, 41 y.o.   MRN: [id number]  This visit occurred during the SARS-CoV-2 public health emergency.  Safety protocols were in place, including screening questions prior to the visit, additional usage of staff PPE, and extensive cleaning of exam room while observing appropriate contact time as indicated for disinfecting solutions.   HPI Pt presents for skin and scalp concerns and med refill  Wt Readings from Last 3 Encounters:  02/08/22 175 lb (79.4 kg)  01/21/22 173 lb (78.5 kg)  10/26/21 170 lb (77.1 kg)   28.46 kg/m  Spot on face Some of her sun spots seem to be turning into a scab Is not good about sun screen  Lots of fun exposure  Outdoors person   Intends to use sunscreen   Spot on R leg Knot  Lateral near knee    Itchy scalp ? 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   Hypertensive urgency 01/10/2020   Chronic headaches 01/10/2020   Skin lesion 06/10/2019   H/O hernia repair 06/10/2019   History of smoking 10-25 pack years 07/14/2014   Routine general medical examination at a health care facility 07/07/2014   Cesarean delivery delivered 01/19/2012   IBS 08/15/2007   History of HPV infection 08/14/2007   Anxiety disorder 08/14/2007   DEPRESSION 08/14/2007   ADD 08/14/2007   Past Medical History:  Diagnosis Date   Anxiety    Kidney failure    Nephrotic syndrome    Past Surgical  History:  Procedure Laterality Date   CESAREAN SECTION  01/06/2005   CESAREAN SECTION  01/19/2012   Procedure: CESAREAN SECTION;  Surgeon: Luz Lex, MD;  Location: [facility];  Service: Gynecology;  Laterality: N/A;  repeat   HERNIA REPAIR     INGUINAL HERNIA REPAIR  01/19/2012   Procedure: HERNIA REPAIR INGUINAL ADULT BILATERAL;  Surgeon: Adin Hector, MD;  Location: [facility];  Service: General;  Laterality: Bilateral;   RENAL BIOPSY, PERCUTANEOUS  01/06/2020       tummy tuck     UMBILICAL HERNIA REPAIR  01/19/2012   Procedure: HERNIA REPAIR UMBILICAL ADULT;  Surgeon: Adin Hector, MD;  Location: [facility];  Service: General;  Laterality: N/A;   UMBILICAL HERNIA REPAIR  11/19/2014   WISDOM TOOTH EXTRACTION     Social History   Tobacco Use   Smoking status: Former    Types: Cigarettes    Quit date: 11/15/2019    Years since quitting: 2.2   Smokeless tobacco: Never  Vaping Use   Vaping Use: Former  Substance Use Topics   Alcohol use: Not Currently   Drug use: Yes    Types: Marijuana    Comment: occ- marijuana    Family History  Problem Relation Age of Onset   Rashes / Skin problems Mother    COPD Father    Stroke Father  Healthy Daughter    Healthy Daughter    Breast cancer Neg Hx    Cancer Neg Hx    Allergies  Allergen Reactions   Amoxicillin Hives    REACTION: rash  No associated shortness of breath or throat swelling   Penicillins Hives   Lipitor [Atorvastatin]     Aches and pains and fatigue   Current Outpatient Medications on File Prior to Visit  Medication Sig Dispense Refill   ALPRAZolam (XANAX) 0.25 MG tablet TAKE 1 TABLET BY MOUTH DAILY AS NEEDED FOR ANXIETY 30 tablet 0   ASPIRIN 81 PO Take 81 mg by mouth daily.     fluticasone (FLONASE) 50 MCG/ACT nasal spray Place 2 sprays into both nostrils daily. 16 g 6   Glucosamine 750 MG TABS Take 1 tablet by mouth daily.     levonorgestrel (MIRENA) 20 MCG/24HR IUD 1 each by Intrauterine route once.      losartan (COZAAR) 50 MG tablet TAKE ONE TABLET BY MOUTH EVERY DAY 30 tablet 5   Multiple Vitamin (MULTIVITAMIN) tablet Take 1 tablet by mouth daily.     hydrOXYzine (VISTARIL) 25 MG capsule TAKE 1 CAPSULE BY MOUTH EVERY 8 HOURS ASNEEDED (Patient not taking: Reported on 02/08/2022) 90 capsule 3   No current facility-administered medications on file prior to visit.      Review of Systems  Constitutional:  Negative for activity change, appetite change, fatigue, fever and unexpected weight change.  HENT:  Negative for congestion, ear pain, rhinorrhea, sinus pressure and sore throat.   Eyes:  Negative for pain, redness and visual disturbance.  Respiratory:  Negative for cough, shortness of breath and wheezing.   Cardiovascular:  Negative for chest pain and palpitations.  Gastrointestinal:  Negative for abdominal pain, blood in stool, constipation and diarrhea.  Endocrine: Negative for polydipsia and polyuria.  Genitourinary:  Negative for dysuria, frequency and urgency.  Musculoskeletal:  Positive for back pain. Negative for arthralgias and myalgias.       Had her back go out/lock up  Low back left Better now  May need a muscle relaxer in the future   Skin:  Negative for pallor and rash.       Itchy scalp   Lump on leg  Allergic/Immunologic: Negative for environmental allergies.  Neurological:  Negative for dizziness, syncope and headaches.  Hematological:  Negative for adenopathy. Does not bruise/bleed easily.  Psychiatric/Behavioral:  Negative for decreased concentration, dysphoric mood and sleep disturbance. The patient is nervous/anxious.       Objective:   Physical Exam Constitutional:      General: She is not in acute distress.    Appearance: Normal appearance. She is normal weight. She is not ill-appearing.  HENT:     Mouth/Throat:     Mouth: Mucous membranes are moist.  Eyes:     General: No scleral icterus.    Conjunctiva/sclera: Conjunctivae normal.     Pupils: Pupils  are equal, round, and reactive to light.  Cardiovascular:     Rate and Rhythm: Normal rate and regular rhythm.     Heart sounds: Normal heart sounds.  Pulmonary:     Effort: Pulmonary effort is normal. No respiratory distress.     Breath sounds: Normal breath sounds. No wheezing.  Musculoskeletal:     Cervical back: Normal range of motion and neck supple. No tenderness.     Right lower leg: No edema.     Left lower leg: No edema.  Lymphadenopathy:     Cervical: No  cervical adenopathy.  Skin:    General: Skin is warm and dry.     Coloration: Skin is not pale.     Findings: No erythema or rash.     Comments: Some lentigines and solar aging  2-3 mm papule on R temple with small scab  No comedone   R lower leg 3-5 cm oval soft lump/rubbery and mobile consistent with lipoma  It dimples with pressure   Normal appearing scalp No evidence of lice    Neurological:     Mental Status: She is alert.     Cranial Nerves: No cranial nerve deficit.     Gait: Gait normal.     Comments: No tremor  Psychiatric:        Attention and Perception: Attention normal.        Mood and Affect: Mood is anxious.        Speech: Speech normal.        Cognition and Memory: Cognition and memory normal.     Comments: Mildly anxious  Pleasant Talkative           Assessment & Plan:   Problem List Items Addressed This Visit       Musculoskeletal and Integument   Skin lesion    Papule with slt scale on R temple Per pt does not heal  Adv not to pick it  Ref done to derm   Conversation re: use of sun protection to prevent skin cancer      Relevant Orders   Ambulatory referral to Dermatology     Other   Anxiety disorder - Primary    More anxiety off of hydroxyzine  Also scalp itching-suspect this is from anxiety or poss allergies   Recommend re start hydroxyzine Do not think this adds to weight at all  Enc her to continue counseling as well        Lump of skin of right lower  extremity    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

## 2022-02-08 NOTE — Assessment & Plan Note (Signed)
Papule with slt scale on R temple Per pt does not heal  Adv not to pick it  Ref done to derm   Conversation re: use of sun protection to prevent skin cancer

## 2022-02-08 NOTE — Assessment & Plan Note (Signed)
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

## 2022-02-09 NOTE — Telephone Encounter (Signed)
Name of Medication: xanax ?Name of Pharmacy: [facility]  ?Last Fill or Written Date and Quantity: 09/14/21 #30 tabs/ 0 refills ?Last Office Visit and Type: f/u on 02/08/22 ?Next Office Visit and Type: none scheduled  ? ?

## 2022-02-16 ENCOUNTER — Ambulatory Visit (INDEPENDENT_AMBULATORY_CARE_PROVIDER_SITE_OTHER): Payer: Medicaid Other | Admitting: Psychology

## 2022-02-16 DIAGNOSIS — F411 Generalized anxiety disorder: Secondary | ICD-10-CM
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
## 2022-03-02 ENCOUNTER — Ambulatory Visit (INDEPENDENT_AMBULATORY_CARE_PROVIDER_SITE_OTHER): Payer: Medicaid Other | Admitting: Psychology

## 2022-03-02 DIAGNOSIS — F411 Generalized anxiety disorder: Secondary | ICD-10-CM | POA: Diagnosis not present
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
## 2022-03-03 ENCOUNTER — Other Ambulatory Visit: Payer: Self-pay

## 2022-03-03 ENCOUNTER — Ambulatory Visit
Admission: RE | Admit: 2022-03-03 | Discharge: 2022-03-03 | Disposition: A | Discharge status: Home / Self Care | Payer: Medicaid Other | Source: Ambulatory Visit | Attending: Obstetrics and Gynecology | Admitting: Obstetrics and Gynecology

## 2022-03-03 ENCOUNTER — Other Ambulatory Visit: Payer: Self-pay | Admitting: Obstetrics and Gynecology

## 2022-03-03 DIAGNOSIS — R928 Other abnormal and inconclusive findings on diagnostic imaging of breast: Secondary | ICD-10-CM

## 2022-03-03 DIAGNOSIS — N63 Unspecified lump in unspecified breast: Secondary | ICD-10-CM

## 2022-03-03 DIAGNOSIS — Z1231 Encounter for screening mammogram for malignant neoplasm of breast: Secondary | ICD-10-CM | POA: Diagnosis not present

## 2022-03-03 DIAGNOSIS — N6489 Other specified disorders of breast: Secondary | ICD-10-CM

## 2022-03-07 DIAGNOSIS — R809 Proteinuria, unspecified: Secondary | ICD-10-CM | POA: Diagnosis not present

## 2022-03-07 DIAGNOSIS — R601 Generalized edema: Secondary | ICD-10-CM | POA: Diagnosis not present

## 2022-03-07 DIAGNOSIS — N050 Unspecified nephritic syndrome with minor glomerular abnormality: Secondary | ICD-10-CM | POA: Diagnosis not present

## 2022-03-07 DIAGNOSIS — R319 Hematuria, unspecified: Secondary | ICD-10-CM | POA: Diagnosis not present

## 2022-03-09 ENCOUNTER — Ambulatory Visit: Payer: Medicaid Other | Admitting: Family Medicine

## 2022-03-09 DIAGNOSIS — R809 Proteinuria, unspecified: Secondary | ICD-10-CM | POA: Diagnosis not present

## 2022-03-09 DIAGNOSIS — N050 Unspecified nephritic syndrome with minor glomerular abnormality: Secondary | ICD-10-CM | POA: Diagnosis not present

## 2022-03-16 ENCOUNTER — Ambulatory Visit
Admission: RE | Admit: 2022-03-16 | Discharge: 2022-03-16 | Disposition: A | Discharge status: Home / Self Care | Payer: Medicaid Other | Source: Ambulatory Visit | Attending: Obstetrics and Gynecology | Admitting: Obstetrics and Gynecology

## 2022-03-16 ENCOUNTER — Ambulatory Visit (INDEPENDENT_AMBULATORY_CARE_PROVIDER_SITE_OTHER): Payer: Medicaid Other | Admitting: Psychology

## 2022-03-16 DIAGNOSIS — N63 Unspecified lump in unspecified breast: Secondary | ICD-10-CM

## 2022-03-16 DIAGNOSIS — F411 Generalized anxiety disorder: Secondary | ICD-10-CM | POA: Diagnosis not present

## 2022-03-16 DIAGNOSIS — N6489 Other specified disorders of breast: Secondary | ICD-10-CM | POA: Diagnosis present

## 2022-03-16 DIAGNOSIS — R928 Other abnormal and inconclusive findings on diagnostic imaging of breast: Secondary | ICD-10-CM | POA: Diagnosis present

## 2022-03-16 DIAGNOSIS — R922 Inconclusive mammogram: Secondary | ICD-10-CM | POA: Diagnosis not present

## 2022-03-16 NOTE — Progress Notes (Signed)
[facility] Counselor/Therapist Progress Note ? ?Patient ID: Christine Rice, MRN: [id number]   ? ?Date: 03/16/22 ? ?Time Spent: 1:38 pm -  2:19 pm : 41 Minutes ? ?Treatment Type: Individual Therapy. ? ?Reported Symptoms: anxiety ? ?Mental Status Exam: ?Appearance:  Neat     ?Behavior: Appropriate  ?Motor: Normal  ?Speech/Language:  Clear and Coherent  ?Affect: Congruent  ?Mood: anxious  ?Thought process: normal  ?Thought content:   WNL  ?Sensory/Perceptual disturbances:   WNL  ?Orientation: oriented to person, place, time/date, and situation  ?Attention: Good  ?Concentration: Good  ?Memory: WNL  ?Fund of knowledge:  Good  ?Insight:   Good  ?Judgment:  Good  ?Impulse Control: Good  ? ?Risk Assessment: ?Danger to Self:  No ?Self-injurious Behavior: No ?Danger to Others: No ?Duty to Warn:no ?Physical Aggression / Violence:No  ?Access to Firearms a concern: No  ?Gang Involvement:No  ? ?Subjective:  ? ?Darl Householder Cesaro participated from home, via video, and consented to treatment. Therapist participated from home office. We met online due to [place] pandemic. Brandye reviewed the events of the past week. Briannah noted some strain between her teenage daughter and herself. She noted her attempts to set boundaries during the session and the stress she's experience when her daughter is argumentative. We discussed ways to communicate assertively, clearly, and positively. 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 We discussed the importance of managing worry. Therapist reintroduced worry-time and discussed ways to manage this worry via this tool. Maraya has been employing this tool, in different contexts, with success. Therapist praised Leonardo provided supportive therapy. Caiya scheduled for numerous follow-ups and continues to benefit from treatment.  ? ?Interventions: Cognitive Behavioral Therapy ? ?Diagnosis:  ?Generalized anxiety disorder ? ?Treatment Plan: ? ?Client  Abilities/Strengths ?Alenah is intelligent, forthcoming, and motivated for change.  ? ?Support System: ?Family and friends.  ? ?Client Treatment Preferences ?OPT ? ?Client Statement of Needs ?She discussed her goals for treatment including managing her symptoms, manage her reactions to stress, advocate more for self, and processing past events.  ? ?Treatment Level ?Weekly ? ?Symptoms ?  ?Anxiety: History of panic attacks (~1.5 years ago), muscle tension, feeling nervous and on-edge, difficulty managing worry, difficulty relaxing, restlessness, irritability, feeling something awful might happen.   (Status: maintained) ?Depression: feeling down, lethargy, difficulty concentrating, difficulty with sleep (infrequent rumination). (Status: maintained) ? ?Goals:  ?Cherrish experiences symptoms of anxiety w/depression.  ? ? ?Target Date: 08/20/2022 Frequency: Weekly  ?Progress: 0 Modality: individual  ? ? ?Therapist will provide referrals for additional resources as appropriate.  ?Therapist will provide psycho-education regarding Jacee's diagnosis and corresponding treatment approaches and interventions. ?Licensed Clinical Social Worker, [name], LCSW will support the patient's ability to achieve the goals identified. will employ CBT, BA, Problem-solving, Solution Focused, Mindfulness,  coping skills, & other evidenced-based practices will be used to promote progress towards healthy functioning to help manage decrease symptoms associated with her diagnosis.  ? Reduce overall level, frequency, and intensity of the feelings of depression, anxiety and panic evidenced by decreased overall symptoms from 6 to 7 days/week to 0 to 1 days/week per client report for at least 3 consecutive months. ?Verbally express understanding of the relationship between feelings of depression, anxiety and their impact on thinking patterns and behaviors. ?Verbalize an understanding of the role that distorted thinking plays in creating fears,  excessive worry, and ruminations. ? ?(Adilyn participated in the creation of the treatment plan) ? ?[name], LCSW ?

## 2022-03-17 ENCOUNTER — Other Ambulatory Visit: Payer: Self-pay | Admitting: Obstetrics and Gynecology

## 2022-03-17 DIAGNOSIS — N6489 Other specified disorders of breast: Secondary | ICD-10-CM

## 2022-04-01 ENCOUNTER — Ambulatory Visit (INDEPENDENT_AMBULATORY_CARE_PROVIDER_SITE_OTHER): Payer: Medicaid Other | Admitting: Psychology

## 2022-04-01 DIAGNOSIS — F411 Generalized anxiety disorder: Secondary | ICD-10-CM

## 2022-04-01 NOTE — Progress Notes (Signed)
[facility] Counselor/Therapist Progress Note ? ?Patient ID: Christine Rice, MRN: [id number]   ? ?Date: 04/01/22 ? ?Time Spent: 9:03 am - 9:54 am :51 Minutes ? ?Treatment Type: Individual Therapy. ? ?Reported Symptoms: anxiety ? ?Mental Status Exam: ?Appearance:  Neat     ?Behavior: Appropriate  ?Motor: Normal  ?Speech/Language:  Clear and Coherent  ?Affect: Congruent  ?Mood: anxious  ?Thought process: normal  ?Thought content:   WNL  ?Sensory/Perceptual disturbances:   WNL  ?Orientation: oriented to person, place, time/date, and situation  ?Attention: Good  ?Concentration: Good  ?Memory: WNL  ?Fund of knowledge:  Good  ?Insight:   Good  ?Judgment:  Good  ?Impulse Control: Good  ? ?Risk Assessment: ?Danger to Self:  No ?Self-injurious Behavior: No ?Danger to Others: No ?Duty to Warn:no ?Physical Aggression / Violence:No  ?Access to Firearms a concern: Yes  ?Gang Involvement:No  ? ?Subjective:  ? ?Christine Rice participated from home, via video, and consented to treatment. Therapist participated from home office. We met online due to Christine Rice. Christine Rice reviewed the events of the past week. She noted her increased anxiety regarding her youngest child's being bullied at school. She described the events of the past week with her daughter and her attempts to manage this, during the session. 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 Therapist provided psycho-education regarding ADHD and provided a book recommendation and a video link regarding unaddressed ADHD and the effects on health and life expectancy.  She discussed suspects her youngest daughter possibly having ADHD.  Therapist provided psychoeducation regarding ADHD as Delsy also has a diagnosis.  She noted being medicated in the past and having difficulty with the medication and young age.  We discussed the importance of ADHD treatment and effects on mood and functioning without set treatment.   Tionne discussed often assuming how situations will pan out and assuming that they will be stressful.  We worked on identifying contributing factors to this.  Therapist provided psychoeducation regarding CBT framework in regards to challenging negative thoughts and feelings.  And modeled this during the session.  Christine Rice was engaged and motivated during the session she expressed her commitment towards her goals including her recent goal of writing a letter to her brother regarding her feelings.  We scheduled numerous follow-ups and therapist provided supportive therapy.  Christine Rice would benefit from continued treatment. ? ? ?Interventions: Cognitive Behavioral Therapy and Interpersonal ? ?Diagnosis:  ?Generalized anxiety disorder ? ?Treatment Plan: ? ?Client Abilities/Strengths ?Christine Rice is intelligent, forthcoming, and motivated for change.  ? ?Support System: ?Family and friends.  ? ?Client Treatment Preferences ?OPT ? ?Client Statement of Needs ?She discussed her goals for treatment including managing her symptoms, manage her reactions to stress, advocate more for self, and processing past events.  ? ?Treatment Level ?Weekly ? ?Symptoms ?  ?Anxiety: History of panic attacks (~1.5 years ago), muscle tension, feeling nervous and on-edge, difficulty managing worry, difficulty relaxing, restlessness, irritability, feeling something awful might happen.   (Status: maintained) ?Depression: feeling down, lethargy, difficulty concentrating, difficulty with sleep (infrequent rumination). (Status: maintained) ? ?Goals:  ?Sury experiences symptoms of anxiety w/depression.  ? ? ?Target Date: 08/20/2022 Frequency: Weekly  ?Progress: 0 Modality: individual  ? ? ?Therapist will provide referrals for additional resources as appropriate.  ?Therapist will provide psycho-education regarding Lee's diagnosis and corresponding treatment approaches and interventions. ?Licensed Clinical Social Worker, [name], LCSW will  support the patient's ability to achieve the goals identified. will employ CBT,  BA, Problem-solving, Solution Focused, Mindfulness,  coping skills, & other evidenced-based practices will be used to promote progress towards healthy functioning to help manage decrease symptoms associated with her diagnosis.  ? Reduce overall level, frequency, and intensity of the feelings of depression, anxiety and panic evidenced by decreased overall symptoms from 6 to 7 days/week to 0 to 1 days/week per client report for at least 3 consecutive months. ?Verbally express understanding of the relationship between feelings of depression, anxiety and their impact on thinking patterns and behaviors. ?Verbalize an understanding of the role that distorted thinking plays in creating fears, excessive worry, and ruminations. ? ?(Lakeysha participated in the creation of the treatment plan) ? ?[name], LCSW ?

## 2022-04-06 ENCOUNTER — Other Ambulatory Visit: Payer: Self-pay

## 2022-04-06 NOTE — Progress Notes (Signed)
Presents to [facility] for on-site pre-employment drug screen.   ? ?Rec - PT Acupuncturist ? ?Bent #:  [id number] ?[facility] Specimen #:  [id number] ? ?Rapid Drug Screen Results = Negative ? ?AMD ?

## 2022-04-19 ENCOUNTER — Ambulatory Visit
Admission: EM | Admit: 2022-04-19 | Discharge: 2022-04-19 | Disposition: A | Discharge status: Home / Self Care | Payer: Medicaid Other | Attending: Student | Admitting: Student

## 2022-04-19 DIAGNOSIS — R0789 Other chest pain: Secondary | ICD-10-CM | POA: Diagnosis not present

## 2022-04-19 NOTE — ED Provider Notes (Signed)
?[facility] ? ? ? ?CSN: [id number] ?Arrival date & time: 04/19/22  1236 ? ? ?  ? ?History   ?Chief Complaint ?Chief Complaint  ?Patient presents with  ? Chest Pain  ? ? ?HPI ?Christine Rice is a 41 y.o. female presenting with upper back and left-sided chest pain for about 2 hours, which has largely resolved at the time of this visit.  History of nephrotic syndrome and kidney failure, and takes losartan for hypertension, but no diagnoses of heart disease.  Describes sharp intermittent upper back and chest pain that has improved throughout the course of the last 2 hours.  Patient states that there is no associated dizziness, shortness of breath.  The pain does not seem to get worse with movement.  She states she has a history of anxiety but this does not feel like a panic attack.  She states that the pain improved significantly when she burped in the car on the way to this visit.  She does not have a history of stomach issues, and does not take NSAIDs, eat spicy food, or drink alcohol frequently.  Denies nausea, abdominal pain, diarrhea at time of visit. ? ?HPI ? ?Past Medical History:  ?Diagnosis Date  ? Anxiety   ? Kidney failure   ? Nephrotic syndrome   ? ? ?Patient Active Problem List  ? Diagnosis Date Noted  ? Lump of skin of right lower extremity 02/08/2022  ? Acute non-recurrent sinusitis 12/24/2021  ? Myofascial pain 10/26/2021  ? Dizziness 10/15/2021  ? Joint pain 08/26/2021  ? 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 Hypertensive urgency 01/10/2020  ? Chronic headaches 01/10/2020  ? Skin lesion 06/10/2019  ? H/O hernia repair 06/10/2019  ? History of smoking 10-25 pack years 07/14/2014  ? Routine general medical examination at a health care facility 07/07/2014  ? Cesarean delivery delivered 01/19/2012  ? IBS 08/15/2007  ? 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 Anxiety disorder 08/14/2007  ? DEPRESSION 08/14/2007  ? ADD 08/14/2007  ? ? ?Past Surgical History:  ?Procedure Laterality Date  ? CESAREAN  SECTION  01/06/2005  ? CESAREAN SECTION  01/19/2012  ? Procedure: CESAREAN SECTION;  Surgeon: Luz Lex, MD;  Location: [facility];  Service: Gynecology;  Laterality: N/A;  repeat  ? HERNIA REPAIR    ? INGUINAL HERNIA REPAIR  01/19/2012  ? Procedure: HERNIA REPAIR INGUINAL ADULT BILATERAL;  Surgeon: Adin Hector, MD;  Location: [facility];  Service: General;  Laterality: Bilateral;  ? RENAL BIOPSY, PERCUTANEOUS  01/06/2020  ?    ? tummy tuck    ? UMBILICAL HERNIA REPAIR  01/19/2012  ? Procedure: HERNIA REPAIR UMBILICAL ADULT;  Surgeon: Adin Hector, MD;  Location: [facility];  Service: General;  Laterality: N/A;  ? UMBILICAL HERNIA REPAIR  11/19/2014  ? WISDOM TOOTH EXTRACTION    ? ? ?OB History   ? ? Gravida  ?3  ? Para  ?2  ? Term  ?2  ? Preterm  ?   ? AB  ?1  ? Living  ?2  ?  ? ? SAB  ?   ? IAB  ?1  ? Ectopic  ?   ? Multiple  ?   ? Live Births  ?2  ?   ?  ?  ? ? ? ?Home Medications   ? ?Prior to Admission medications   ?Medication Sig Start Date End Date Taking? Authorizing Provider  ?ALPRAZolam (XANAX) 0.25 MG tablet TAKE 1 TABLET BY MOUTH  DAILY AS NEEDED FOR ANXIETY 02/09/22   Tower, Wynelle Fanny, MD  ?ASPIRIN 81 PO Take 81 mg by mouth daily.    [provider]  ?fluticasone (FLONASE) 50 MCG/ACT nasal spray Place 2 sprays into both nostrils daily. 10/15/21   Tower, Wynelle Fanny, MD  ?Glucosamine 750 MG TABS Take 1 tablet by mouth daily.    [provider]  ?hydrOXYzine (VISTARIL) 25 MG capsule TAKE 1 CAPSULE BY MOUTH EVERY 8 HOURS ASNEEDED ?Patient not taking: Reported on 02/08/2022 12/01/21   Abner Greenspan, MD  ?levonorgestrel (MIRENA) 20 MCG/24HR IUD 1 each by Intrauterine route once.    [provider]  ?losartan (COZAAR) 50 MG tablet TAKE ONE TABLET BY MOUTH EVERY DAY 10/28/20 02/09/24  Kolluru, Lurena Nida, MD  ?Multiple Vitamin (MULTIVITAMIN) tablet Take 1 tablet by mouth daily. 01/07/20   Jennye Boroughs, MD  ? ? ?Family History ?Family History  ?Problem Relation Age of Onset  ? Rashes / Skin problems  Mother   ? COPD Father   ? Stroke Father   ? Healthy Daughter   ? Healthy Daughter   ? Breast cancer Neg Hx   ? Cancer Neg Hx   ? ? ?Social History ?Social History  ? ?Tobacco Use  ? Smoking status: Former  ?  Types: Cigarettes  ?  Quit date: 11/15/2019  ?  Years since quitting: 2.4  ? Smokeless tobacco: Never  ?Vaping Use  ? Vaping Use: Former  ?Substance Use Topics  ? Alcohol use: Not Currently  ? Drug use: Yes  ?  Types: Marijuana  ?  Comment: occ- marijuana   ? ? ? ?Allergies   ?Amoxicillin, Penicillins, and Lipitor [atorvastatin] ? ? ?Review of Systems ?Review of Systems  ?Cardiovascular:  Positive for chest pain.  ?All other systems reviewed and are negative. ? ? ?Physical Exam ?Triage Vital Signs ?ED Triage Vitals [04/19/22 1331]  ?Enc Vitals Group  ?   BP 127/76  ?   Pulse Rate 60  ?   Resp 16  ?   Temp 98.1 ?F (36.7 ?C)  ?   Temp Source Temporal  ?   SpO2 98 %  ?   Weight   ?   Height   ?   Head Circumference   ?   Peak Flow   ?   Pain Score   ?   Pain Loc   ?   Pain Edu?   ?   Excl. in [place]?   ? ?No data found. ? ?Updated Vital Signs ?BP 127/76 (BP Location: Left Arm)   Pulse 60   Temp 98.1 ?F (36.7 ?C) (Temporal)   Resp 16   SpO2 98%  ? ?Visual Acuity ?Right Eye Distance:   ?Left Eye Distance:   ?Bilateral Distance:   ? ?Right Eye Near:   ?Left Eye Near:    ?Bilateral Near:    ? ?Physical Exam ?Vitals reviewed.  ?Constitutional:   ?   Appearance: Normal appearance. She is not diaphoretic.  ?HENT:  ?   Head: Normocephalic and atraumatic.  ?   Mouth/Throat:  ?   Mouth: Mucous membranes are moist.  ?Eyes:  ?   Extraocular Movements: Extraocular movements intact.  ?   Pupils: Pupils are equal, round, and reactive to light.  ?Cardiovascular:  ?   Rate and Rhythm: Normal rate and regular rhythm.  ?   Pulses:     ?     Radial pulses are 2+ on the right side and 2+  on the left side.  ?   Heart sounds: Normal heart sounds.  ?Pulmonary:  ?   Effort: Pulmonary effort is normal.  ?   Breath sounds: Normal breath  sounds.  ?Chest:  ?   Comments: Pain is not reproducible  ?Abdominal:  ?   Palpations: Abdomen is soft.  ?   Tenderness: There is no abdominal tenderness. There is no guarding or rebound.  ?Musculoskeletal:  ?   Right lower leg: No edema.  ?   Left lower leg: No edema.  ?Skin: ?   General: Skin is warm.  ?   Capillary Refill: Capillary refill takes less than 2 seconds.  ?Neurological:  ?   General: No focal deficit present.  ?   Mental Status: She is alert and oriented to person, place, and time.  ?Psychiatric:     ?   Mood and Affect: Mood normal.     ?   Behavior: Behavior normal.     ?   Thought Content: Thought content normal.     ?   Judgment: Judgment normal.  ? ? ? ?UC Treatments / Results  ?Labs ?(all labs ordered are listed, but only abnormal results are displayed) ?Labs Reviewed - No data to display ? ?EKG ? ? ?Radiology ?No results found. ? ?Procedures ?Procedures (including critical care time) ? ?Medications Ordered in UC ?Medications - No data to display ? ?Initial Impression / Assessment and Plan / UC Course  ?I have reviewed the triage vital signs and the nursing notes. ? ?Pertinent labs & imaging results that were available during my care of the patient were reviewed by me and considered in my medical decision making (see chart for details). ? ?  ? ?This patient is a very pleasant 41 y.o. year old female presenting with left sided chest pain that has largely resolved after belching prior to visit.  ? ?Chest pain is not reproducible. EKG borderline bradycardic but otherwise unchanged from 06/21/21 EKG.  ? ?Discussed that while I cannot completely exclude cardiac pathology, it is a good sign that the pain is improving on its own after belching.  Head to the emergency department if the pain return or worsens, or new symptoms like dizziness and shortness of breath. ? ?Final Clinical Impressions(s) / UC Diagnoses  ? ?Final diagnoses:  ?Atypical chest pain  ? ? ? ?Discharge Instructions   ? ?  ?-If symptoms  return or worsen, head to the ED for additional cardiac workup we can't perform in urgent care. Your EKG looks normal today though this doesn't completely exclude cardiac pathology. ? ? ?ED Prescriptions   ?Non

## 2022-04-19 NOTE — Discharge Instructions (Addendum)
-  If symptoms return or worsen, head to the ED for additional cardiac workup we can't perform in urgent care. Your EKG looks normal today though this doesn't completely exclude cardiac pathology. ?

## 2022-04-19 NOTE — ED Triage Notes (Signed)
Patient presents to Urgent Care with complaints of upper back pain that radiates to chest area x 2 hrs ago. Pt states she immediately checked her BP and the readings were elevated based off her baseline. Last two readings she states were 145/83 and 130/95.  Pt states she has a hx of anxiety but this does not feel like a panic attack.  ? ?Denies SOB.  ?

## 2022-04-20 ENCOUNTER — Ambulatory Visit (INDEPENDENT_AMBULATORY_CARE_PROVIDER_SITE_OTHER): Payer: Medicaid Other | Admitting: Psychology

## 2022-04-20 DIAGNOSIS — F411 Generalized anxiety disorder: Secondary | ICD-10-CM | POA: Diagnosis not present

## 2022-04-20 NOTE — Progress Notes (Signed)
[facility] Counselor/Therapist Progress Note ? ?Patient ID: Christine Rice, MRN: [id number]   ? ?Date: 04/20/22 ? ?Time Spent: 9:04 am - 9:55 am :  51 Minutes ? ?Treatment Type: Individual Therapy. ? ?Reported Symptoms: anxiety ? ?Mental Status Exam: ?Appearance:  Neat     ?Behavior: Appropriate  ?Motor: Normal  ?Speech/Language:  Clear and Coherent  ?Affect: Congruent  ?Mood: anxious  ?Thought process: normal  ?Thought content:   WNL  ?Sensory/Perceptual disturbances:   WNL  ?Orientation: oriented to person, place, time/date, and situation  ?Attention: Good  ?Concentration: Good  ?Memory: WNL  ?Fund of knowledge:  Good  ?Insight:   Good  ?Judgment:  Good  ?Impulse Control: Good  ? ?Risk Assessment: ?Danger to Self:  No ?Self-injurious Behavior: No ?Danger to Others: No ?Duty to Warn:no ?Physical Aggression / Violence:No  ?Access to Firearms a concern: Yes  ?Gang Involvement:No  ? ?Subjective:  ? ?Christine Rice participated from home, via video, and consented to treatment. Therapist participated from home office. We met online due to [name] Christine Rice reviewed the events of the past week. Christine Rice noted feeling ill and sick and visiting the urgent care and noted getting a clear bill of health. She noted getting a new job and noted feeling excited about the experience. She noted her worry regarding her daughter's possible ADHD symptoms. We explored this during the session and discussed ways to manage her worry and investigate this further. Therapist provided book recommendation for ADHD children. Christine Rice wrote a letter to her brother regarding her feelings. We processed this during the session. We explored her expectations regarding his behavior and decision-making. Therapist encouraged Christine Rice to set reasonable expectations and challenged her assumptions regarding her brother's motivation for specific behavior. We will continue to explore this during the session.  Christine Rice was engaged and  motivated. She would benefit from continued treatment. Therapist validated Christine Rice's experience and provided supportive therapy. ? ?Interventions: Cognitive Behavioral Therapy and Interpersonal ? ?Diagnosis:  ?Generalized anxiety disorder ? ?Treatment Plan: ? ?Client Abilities/Strengths ?Christine Rice is intelligent, forthcoming, and motivated for change.  ? ?Support System: ?Family and friends.  ? ?Client Treatment Preferences ?OPT ? ?Client Statement of Needs ?She discussed her goals for treatment including managing her symptoms, manage her reactions to stress, advocate more for self, and processing past events.  ? ?Treatment Level ?Weekly ? ?Symptoms ?  ?Anxiety: History of panic attacks (~1.5 years ago), muscle tension, feeling nervous and on-edge, difficulty managing worry, difficulty relaxing, restlessness, irritability, feeling something awful might happen.   (Status: maintained) ?Depression: feeling down, lethargy, difficulty concentrating, difficulty with sleep (infrequent rumination). (Status: maintained) ? ?Goals:  ?Christine Rice experiences symptoms of anxiety w/depression.  ? ? ?Target Date: 08/20/2022 Frequency: Weekly  ?Progress: 5% Modality: individual  ? ? ?Therapist will provide referrals for additional resources as appropriate.  ?Therapist will provide psycho-education regarding Christine Rice's diagnosis and corresponding treatment approaches and interventions. ?Licensed Clinical Social Worker, Christine Irish, LCSW will support the patient's ability to achieve the goals identified. will employ CBT, BA, Problem-solving, Solution Focused, Mindfulness,  coping skills, & other evidenced-based practices will be used to promote progress towards healthy functioning to help manage decrease symptoms associated with her diagnosis.  ? Reduce overall level, frequency, and intensity of the feelings of depression, anxiety and panic evidenced by decreased overall symptoms from 6 to 7 days/week to 0 to 1 days/week per client report  for at least 3 consecutive months. ?Verbally express understanding of the relationship between feelings of depression, anxiety  and their impact on thinking patterns and behaviors. ?Verbalize an understanding of the role that distorted thinking plays in creating fears, excessive worry, and ruminations. ? ?(Christine Rice participated in the creation of the treatment plan) ? ?Christine Irish, LCSW ?

## 2022-05-04 ENCOUNTER — Ambulatory Visit (INDEPENDENT_AMBULATORY_CARE_PROVIDER_SITE_OTHER): Payer: Medicaid Other | Admitting: Psychology

## 2022-05-04 DIAGNOSIS — F411 Generalized anxiety disorder: Secondary | ICD-10-CM

## 2022-05-04 NOTE — Progress Notes (Signed)
[facility] Counselor/Therapist Progress Note  Patient ID: Christine Rice, MRN: [id number]    Date: 05/04/22  Time Spent: 12:35 pm - 1:22 pm :  47  Minutes  Treatment Type: Individual Therapy.  Reported Symptoms: anxiety  Mental Status Exam: Appearance:  Neat     Behavior: Appropriate  Motor: Normal  Speech/Language:  Clear and Coherent  Affect: Congruent  Mood: anxious  Thought process: normal  Thought content:   WNL  Sensory/Perceptual disturbances:   WNL  Orientation: oriented to person, place, time/date, and situation  Attention: Good  Concentration: Good  Memory: WNL  Fund of knowledge:  Good  Insight:   Good  Judgment:  Good  Impulse Control: Good   Risk Assessment: Danger to Self:  No Self-injurious Behavior: No Danger to Others: No Duty to Warn:no Physical Aggression / Violence:No  Access to Firearms a concern: Yes  Gang Involvement:No   Subjective:   Christine Rice participated from home, via video, and consented to treatment. Therapist participated from home office. We met online due to [place] pandemic. Christine Rice reviewed the events of the past week. [name] noted her mother crossing a boundary, discussing an adult topic with Christine Rice's eldest daughter, Christine Rice, discussing Caleb and her brother's relationship strain. Christine Rice noted her hurt feelings and frustration regarding her mother's behavior. We explored this, during the session, and her worry that this situations will affect their relationship going forward. She noted not feeling valuable to her family much like her ex-boyfriend. We processed her feelings, worked on identifying what she would like to do, going forward. Therapist encouraged Christine Rice to identify her short-term and long-term goals for her relationship with her mother. Christine Rice was engaged and motivated. She would benefit from continued treatment. Therapist validated Christine Rice's experience and provided supportive therapy.  Interventions:  Cognitive Behavioral Therapy and Interpersonal  Diagnosis:  Generalized anxiety disorder  Treatment Plan:  Client Abilities/Strengths Christine Rice is intelligent, forthcoming, and motivated for change.   Support System: Family and friends.   Client Treatment Preferences OPT  Client Statement of Needs She discussed her goals for treatment including managing her symptoms, manage her reactions to stress, advocate more for self, and processing past events.   Treatment Level Weekly  Symptoms   Anxiety: History of panic attacks (~1.5 years ago), muscle tension, feeling nervous and on-edge, difficulty managing worry, difficulty relaxing, restlessness, irritability, feeling something awful might happen.   (Status: maintained) Depression: feeling down, lethargy, difficulty concentrating, difficulty with sleep (infrequent rumination). (Status: maintained)  Goals:  Christine Rice experiences symptoms of anxiety w/depression.    Target Date: 08/20/2022 Frequency: Weekly  Progress: 5% Modality: individual    Therapist will provide referrals for additional resources as appropriate.  Therapist will provide psycho-education regarding Christine Rice's diagnosis and corresponding treatment approaches and interventions. Licensed Clinical Social Worker, [place], LCSW will support the patient's ability to achieve the goals identified. will employ CBT, BA, Problem-solving, Solution Focused, Mindfulness,  coping skills, & other evidenced-based practices will be used to promote progress towards healthy functioning to help manage decrease symptoms associated with her diagnosis.   Reduce overall level, frequency, and intensity of the feelings of depression, anxiety and panic evidenced by decreased overall symptoms from 6 to 7 days/week to 0 to 1 days/week per client report for at least 3 consecutive months. Verbally express understanding of the relationship between feelings of depression, anxiety and their impact on  thinking patterns and behaviors. Verbalize an understanding of the role that distorted thinking plays in creating fears, excessive worry,  and ruminations.  Christine Rice participated in the creation of the treatment plan)  [name], LCSW

## 2022-05-18 ENCOUNTER — Ambulatory Visit (INDEPENDENT_AMBULATORY_CARE_PROVIDER_SITE_OTHER): Payer: Medicaid Other | Admitting: Psychology

## 2022-05-18 DIAGNOSIS — F411 Generalized anxiety disorder: Secondary | ICD-10-CM | POA: Diagnosis not present

## 2022-05-18 NOTE — Progress Notes (Signed)
[facility] Counselor/Therapist Progress Note  Patient ID: Christine Rice, MRN: [id number]    Date: 05/18/22  Time Spent: 1:02 pm - 1:47 pm :  45 Minutes  Treatment Type: Individual Therapy.  Reported Symptoms: Anxiety.  Mental Status Exam: Appearance:  Neat     Behavior: Appropriate  Motor: Normal  Speech/Language:  Clear and Coherent  Affect: Congruent  Mood: anxious  Thought process: normal  Thought content:   WNL  Sensory/Perceptual disturbances:   WNL  Orientation: oriented to person, place, time/date, and situation  Attention: Good  Concentration: Good  Memory: WNL  Fund of knowledge:  Good  Insight:   Good  Judgment:  Good  Impulse Control: Good   Risk Assessment: Danger to Self:  No Self-injurious Behavior: No Danger to Others: No Duty to Warn:no Physical Aggression / Violence:No  Access to Firearms a concern: Yes  Gang Involvement:No   Subjective:   Christine Rice participated from home, via video, and consented to treatment. Therapist participated from home office. We met online due to [place] pandemic. Ariza reviewed the events of the past week. Christine Rice noted feeling down, at times, and noted this primarily due interpersonal/familial stressors in the home.  She noted continued frustration with her mother. We worked on identifying her avoidance regarding discussing her concerns with her mother due to the fear of her reaction. Christine Rice discussed her avoidance to address interpersonal issues. We dicussed the pros and cons of this approach and the cost to [facility] in regards to social support. Therapist encouraged Christine Rice to write a letter to her mother, an unsent letter, to process feelings, identify and communicate boundaries, and discuss her feelings. Therapist modeled this during the session. Christine Rice was engaged and motivated. She would benefit from continued treatment. We will process this letter going forward. Therapist validated Christine Rice's  experience and provided supportive therapy.  Interventions: Cognitive Behavioral Therapy and Interpersonal  Diagnosis:  Generalized anxiety disorder  Treatment Plan:  Client Abilities/Strengths Christine Rice is intelligent, forthcoming, and motivated for change.   Support System: Family and friends.   Client Treatment Preferences OPT  Client Statement of Needs She discussed her goals for treatment including managing her symptoms, manage her reactions to stress, advocate more for self, and processing past events.   Treatment Level Weekly  Symptoms   Anxiety: History of panic attacks (~1.5 years ago), muscle tension, feeling nervous and on-edge, difficulty managing worry, difficulty relaxing, restlessness, irritability, feeling something awful might happen.   (Status: maintained) Depression: feeling down, lethargy, difficulty concentrating, difficulty with sleep (infrequent rumination). (Status: declined)  Goals:  Christine Rice experiences symptoms of anxiety w/depression.    Target Date: 08/20/2022 Frequency: Weekly  Progress: 5% Modality: individual    Therapist will provide referrals for additional resources as appropriate.  Therapist will provide psycho-education regarding Christine Rice's diagnosis and corresponding treatment approaches and interventions. Licensed Clinical Social Worker, [place], LCSW will support the patient's ability to achieve the goals identified. will employ CBT, BA, Problem-solving, Solution Focused, Mindfulness,  coping skills, & other evidenced-based practices will be used to promote progress towards healthy functioning to help manage decrease symptoms associated with her diagnosis.   Reduce overall level, frequency, and intensity of the feelings of depression, anxiety and panic evidenced by decreased overall symptoms from 6 to 7 days/week to 0 to 1 days/week per client report for at least 3 consecutive months. Verbally express understanding of the relationship  between feelings of depression, anxiety and their impact on thinking patterns and behaviors. Verbalize an understanding  of the role that distorted thinking plays in creating fears, excessive worry, and ruminations.  Christine Rice participated in the creation of the treatment plan)  [name], LCSW

## 2022-06-01 ENCOUNTER — Ambulatory Visit (INDEPENDENT_AMBULATORY_CARE_PROVIDER_SITE_OTHER): Payer: Medicaid Other | Admitting: Psychology

## 2022-06-01 DIAGNOSIS — F411 Generalized anxiety disorder: Secondary | ICD-10-CM

## 2022-06-01 NOTE — Progress Notes (Signed)
[facility] Counselor/Therapist Progress Note  Patient ID: Christine Rice, MRN: [id number]    Date: 06/01/22  Time Spent: 9:05 am - 9:59 am :  54 Minutes  Treatment Type: Individual Therapy.  Reported Symptoms: Anxiety.  Mental Status Exam: Appearance:  Neat     Behavior: Appropriate  Motor: Normal  Speech/Language:  Clear and Coherent  Affect: Congruent  Mood: anxious  Thought process: normal  Thought content:   WNL  Sensory/Perceptual disturbances:   WNL  Orientation: oriented to person, place, time/date, and situation  Attention: Good  Concentration: Good  Memory: WNL  Fund of knowledge:  Good  Insight:   Good  Judgment:  Good  Impulse Control: Good   Risk Assessment: Danger to Self:  No Self-injurious Behavior: No Danger to Others: No Duty to Warn:no Physical Aggression / Violence:No  Access to Firearms a concern: Yes  Gang Involvement:No   Subjective:   Christine Rice participated from home, via video, and consented to treatment. Therapist participated from home office. We met online due to [place] pandemic. [name] reviewed the events of the past week. Therma noted drafting an email to her mother but has not sent the email to her mother. She noted her 62 year old daughter taking a vape pen to church camp and having to be let go from her volunteer position. Shan noted having to set discipline as a result. Delesa noted tossing and turning, sick on stomach, tearfulness, feeling sad, disappointment, catastrophizing. She noted her thoughts about specific stressors are intrusive. She noted difficulty managing her stressors and noted that her reaction "isn't normal".   We explored this during the session. [name] shared the letter to her mother and discussed her increasing anxiety regarding her mother's response to the letter and boundaries. WE explored this during the session. Therapist encouraged Christine Rice to rewrite the letter focusing on the current  issues and making it concise and direct with clear boundaries. Therapist encouraged Christine Rice to contact her pcp regarding a daily anti-depressant, should this be clinically appropriate due to frequency, severity, and duration of symptoms.  Christine Rice was engaged and motivated. She would benefit from continued treatment. We will process this letter going forward. Therapist validated Christine Rice's experience and provided supportive therapy.  Interventions: Cognitive Behavioral Therapy and Interpersonal  Diagnosis:  Generalized anxiety disorder  Treatment Plan:  Client Abilities/Strengths Jessa is intelligent, forthcoming, and motivated for change.   Support System: Family and friends.   Client Treatment Preferences OPT  Client Statement of Needs She discussed her goals for treatment including managing her symptoms, manage her reactions to stress, advocate more for self, and processing past events.   Treatment Level Weekly  Symptoms   Anxiety: History of panic attacks (~1.5 years ago), muscle tension, feeling nervous and on-edge, difficulty managing worry, difficulty relaxing, restlessness, irritability, feeling something awful might happen.   (Status: maintained) Depression: feeling down, lethargy, difficulty concentrating, difficulty with sleep (infrequent rumination). (Status: declined)  Goals:  [place] experiences symptoms of anxiety w/depression.    Target Date: 08/20/2022 Frequency: Weekly  Progress: 5% Modality: individual    Therapist will provide referrals for additional resources as appropriate.  Therapist will provide psycho-education regarding Christine Rice's diagnosis and corresponding treatment approaches and interventions. Licensed Clinical Social Worker, [place], LCSW will support the patient's ability to achieve the goals identified. will employ CBT, BA, Problem-solving, Solution Focused, Mindfulness,  coping skills, & other evidenced-based practices will be used to  promote progress towards healthy functioning to help manage decrease symptoms associated  with her diagnosis.   Reduce overall level, frequency, and intensity of the feelings of depression, anxiety and panic evidenced by decreased overall symptoms from 6 to 7 days/week to 0 to 1 days/week per client report for at least 3 consecutive months. Verbally express understanding of the relationship between feelings of depression, anxiety and their impact on thinking patterns and behaviors. Verbalize an understanding of the role that distorted thinking plays in creating fears, excessive worry, and ruminations.  Christine Rice participated in the creation of the treatment plan)  [name], LCSW

## 2022-06-08 ENCOUNTER — Ambulatory Visit (INDEPENDENT_AMBULATORY_CARE_PROVIDER_SITE_OTHER): Payer: Medicaid Other | Admitting: Family Medicine

## 2022-06-08 ENCOUNTER — Encounter: Payer: Self-pay | Admitting: Family Medicine

## 2022-06-08 VITALS — BP 108/64 | HR 90 | Ht 65.75 in | Wt 176.4 lb

## 2022-06-08 DIAGNOSIS — M79605 Pain in left leg: Secondary | ICD-10-CM | POA: Diagnosis not present

## 2022-06-08 DIAGNOSIS — M7918 Myalgia, other site: Secondary | ICD-10-CM

## 2022-06-08 DIAGNOSIS — M79606 Pain in leg, unspecified: Secondary | ICD-10-CM | POA: Insufficient documentation

## 2022-06-08 DIAGNOSIS — F411 Generalized anxiety disorder: Secondary | ICD-10-CM | POA: Diagnosis not present

## 2022-06-08 DIAGNOSIS — M79604 Pain in right leg: Secondary | ICD-10-CM | POA: Diagnosis not present

## 2022-06-08 MED ORDER — DULOXETINE HCL 30 MG PO CPEP: 30.0000 mg | ORAL_CAPSULE | Freq: Every day | ORAL | 3 refills | Status: DC

## 2022-06-08 NOTE — Patient Instructions (Addendum)
I would like to try cymbalta for mood/anxiety and your leg pain   It takes a while to work - up to a month  If you feel worse or have intolerable side effects please stop it and let me know   Hydroxyzine is still ok to use as needed   Follow up in 1-2 months   Take care of yourself  Stay as active as you can be - you are doing great with that

## 2022-06-08 NOTE — Assessment & Plan Note (Signed)
Ongoing/worse with mood change and anxiety   Disc tx options  Enc her to continue exercise (doing well with that)  Trial of cymbalta 30 mg daily with follow up  Discussed expectations of SNRI medication including time to effectiveness and mechanism of action, also poss of side effects (early and late)- including mental fuzziness, weight or appetite change, nausea and poss of worse dep or anxiety (even suicidal thoughts)  Pt voiced understanding and will stop med and update if this occurs   Enc her to continue counseling /this is helpful and continue good self care

## 2022-06-08 NOTE — Progress Notes (Signed)
Subjective:    Patient ID: Christine Rice, female    DOB: 1981-02-05, 41 y.o.   MRN: [id number]  HPI Pt presents for f/u of GAD and also leg pain   Wt Readings from Last 3 Encounters:  06/08/22 176 lb 6.4 oz (80 kg)  02/08/22 175 lb (79.4 kg)  01/21/22 173 lb (78.5 kg)   28.69 kg/m  Anxiety is bad again  She scratches when she is anxious  Not a picker  Thinks itching/ hard to tell -just scratches skin when anxious   Gets really upset for things that she shouldn't    Hydroxyzine no longer helping  Always worried about weight gain   She uses marijuana   Works for [employer]    Legs are achy and throbbing - wonders if this is from her mood also  Wants to get her life back   Low dose low dose naltrexone (was treated by Dr Jimmye Norman) - alternative ?  Tx for pain after covid and chronic pain  ? 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   Hypertensive urgency 01/10/2020   Chronic headaches 01/10/2020   Skin lesion 06/10/2019   H/O hernia repair 06/10/2019   History of smoking 10-25 pack years 07/14/2014   Routine general medical examination at a health care facility 07/07/2014   Cesarean delivery delivered 01/19/2012   IBS 08/15/2007   History of HPV infection 08/14/2007   Anxiety disorder 08/14/2007   DEPRESSION 08/14/2007   ADD 08/14/2007   Past Medical History:  Diagnosis Date   Anxiety    Kidney failure    Nephrotic syndrome     Past Surgical History:  Procedure Laterality Date   CESAREAN SECTION  01/06/2005   CESAREAN SECTION  01/19/2012   Procedure: CESAREAN SECTION;  Surgeon: Luz Lex, MD;  Location: [facility];  Service: Gynecology;  Laterality: N/A;  repeat   HERNIA REPAIR     INGUINAL HERNIA REPAIR  01/19/2012   Procedure: HERNIA REPAIR INGUINAL ADULT BILATERAL;  Surgeon: Adin Hector, MD;  Location: [facility];  Service: General;  Laterality: Bilateral;   RENAL BIOPSY, PERCUTANEOUS  01/06/2020       tummy tuck     UMBILICAL HERNIA REPAIR  01/19/2012   Procedure: HERNIA REPAIR UMBILICAL ADULT;  Surgeon: Adin Hector, MD;  Location: [facility];  Service: General;  Laterality: N/A;   UMBILICAL HERNIA REPAIR  11/19/2014   WISDOM TOOTH EXTRACTION     Social History   Tobacco Use   Smoking status: Former    Types: Cigarettes    Quit date: 11/15/2019    Years since quitting: 2.5   Smokeless tobacco: Never  Vaping Use   Vaping Use: Former  Substance Use Topics   Alcohol use: Not Currently   Drug use: Yes    Types: Marijuana    Comment: occ- marijuana    Family History  Problem Relation Age of  Onset   Rashes / Skin problems Mother    COPD Father    Stroke Father    Healthy Daughter    Healthy Daughter    Breast cancer Neg Hx    Cancer Neg Hx    Allergies  Allergen Reactions   Amoxicillin Hives    REACTION: rash  No associated shortness of breath or throat swelling   Penicillins Hives   Lipitor [Atorvastatin]     Aches and pains and fatigue   Current Outpatient Medications on File Prior to Visit  Medication Sig Dispense Refill   ALPRAZolam (XANAX) 0.25 MG tablet TAKE 1 TABLET BY MOUTH DAILY AS NEEDED FOR ANXIETY 30 tablet 0   ASPIRIN 81 PO Take 81 mg by mouth daily.     fluticasone (FLONASE) 50 MCG/ACT nasal spray Place 2 sprays into both nostrils daily. 16 g 6   Glucosamine 750 MG TABS Take 1 tablet by mouth daily.     hydrOXYzine (VISTARIL) 25 MG capsule TAKE 1 CAPSULE BY MOUTH EVERY  8 HOURS ASNEEDED 90 capsule 3   levonorgestrel (MIRENA) 20 MCG/24HR IUD 1 each by Intrauterine route once.     losartan (COZAAR) 50 MG tablet TAKE ONE TABLET BY MOUTH EVERY DAY 30 tablet 5   Multiple Vitamin (MULTIVITAMIN) tablet Take 1 tablet by mouth daily.     naltrexone (DEPADE) 50 MG tablet Take by mouth daily. Pt is unsure of dose     No current facility-administered medications on file prior to visit.     Review of Systems  Constitutional:  Negative for activity change, appetite change, fatigue, fever and unexpected weight change.  HENT:  Negative for congestion, ear pain, rhinorrhea, sinus pressure and sore throat.   Eyes:  Negative for pain, redness and visual disturbance.  Respiratory:  Negative for cough, shortness of breath and wheezing.   Cardiovascular:  Negative for chest pain and palpitations.  Gastrointestinal:  Negative for abdominal pain, blood in stool, constipation and diarrhea.  Endocrine: Negative for polydipsia and polyuria.  Genitourinary:  Negative for dysuria, frequency and urgency.  Musculoskeletal:  Positive for back pain and myalgias. Negative for arthralgias.  Skin:  Negative for pallor and rash.  Allergic/Immunologic: Negative for environmental allergies.  Neurological:  Negative for dizziness, syncope and headaches.  Hematological:  Negative for adenopathy. Does not bruise/bleed easily.  Psychiatric/Behavioral:  Positive for dysphoric mood. Negative for decreased concentration. The patient is nervous/anxious.        Objective:   Physical Exam Constitutional:      General: She is not in acute distress.    Appearance: Normal appearance. She is well-developed and normal weight. She is not ill-appearing or diaphoretic.  HENT:     Head: Normocephalic and atraumatic.  Eyes:     Conjunctiva/sclera: Conjunctivae normal.     Pupils: Pupils are equal, round, and reactive to light.  Neck:     Thyroid: No thyromegaly.     Vascular: No carotid bruit or JVD.   Cardiovascular:     Rate and Rhythm: Normal rate and regular rhythm.     Heart sounds: Normal heart sounds.     No gallop.  Pulmonary:     Effort: Pulmonary effort is normal. No respiratory distress.     Breath sounds: Normal breath sounds. No wheezing or rales.  Abdominal:     General: There is no distension or abdominal bruit.     Palpations: Abdomen is soft.  Musculoskeletal:     Cervical back: Normal range of motion  and neck supple.     Right lower leg: No edema.     Left lower leg: No edema.     Comments: No acute joint changes Some crepitus in knees   Some pos myofascial trigger points   Lymphadenopathy:     Cervical: No cervical adenopathy.  Skin:    General: Skin is warm and dry.     Coloration: Skin is not pale.     Findings: No rash.  Neurological:     Mental Status: She is alert.     Coordination: Coordination normal.     Deep Tendon Reflexes: Reflexes are normal and symmetric. Reflexes normal.  Psychiatric:        Attention and Perception: Attention normal.        Mood and Affect: Mood is anxious.        Speech: Speech normal.        Behavior: Behavior normal.        Cognition and Memory: Cognition and memory normal.     Comments: .cndid            Assessment & Plan:   Problem List Items Addressed This Visit       Musculoskeletal and Integument   Myofascial pain dysfunction syndrome    Ongoing/worse with mood change and anxiety   Disc tx options  Enc her to continue exercise (doing well with that)  Trial of cymbalta 30 mg daily with follow up  Discussed expectations of SNRI medication including time to effectiveness and mechanism of action, also poss of side effects (early and late)- including mental fuzziness, weight or appetite change, nausea and poss of worse dep or anxiety (even suicidal thoughts)  Pt voiced understanding and will stop med and update if this occurs   Enc her to continue counseling /this is helpful and continue good self  care       Relevant Medications   DULoxetine (CYMBALTA) 30 MG capsule     Other   Anxiety disorder - Primary    Limited benefit of hydroxyzine Xanax for emergencies Seeing counselor  Reviewed stressors/ coping techniques/symptoms/ support sources/ tx options and side effects in detail today  Plan trial of cymbalta 30 mg for this and myofacial pain  F/u planned       Relevant Medications   DULoxetine (CYMBALTA) 30 MG capsule   Leg pain

## 2022-06-08 NOTE — Assessment & Plan Note (Signed)
Limited benefit of hydroxyzine Xanax for emergencies Seeing counselor  Reviewed stressors/ coping techniques/symptoms/ support sources/ tx options and side effects in detail today  Plan trial of cymbalta 30 mg for this and myofacial pain  F/u planned

## 2022-06-27 ENCOUNTER — Ambulatory Visit (INDEPENDENT_AMBULATORY_CARE_PROVIDER_SITE_OTHER): Payer: Medicaid Other | Admitting: Psychology

## 2022-06-27 DIAGNOSIS — F411 Generalized anxiety disorder: Secondary | ICD-10-CM

## 2022-06-27 NOTE — Progress Notes (Signed)
[facility] Counselor/Therapist Progress Note  Patient ID: Christine Rice, MRN: [id number]    Date: 06/27/22  Time Spent: 1:04 pm - 1:54 pm :  50 Minutes  Treatment Type: Individual Therapy.  Reported Symptoms: Anxiety.  Mental Status Exam: Appearance:  Neat     Behavior: Appropriate  Motor: Normal  Speech/Language:  Clear and Coherent  Affect: Congruent  Mood: anxious  Thought process: normal  Thought content:   WNL  Sensory/Perceptual disturbances:   WNL  Orientation: oriented to person, place, time/date, and situation  Attention: Good  Concentration: Good  Memory: WNL  Fund of knowledge:  Good  Insight:   Good  Judgment:  Good  Impulse Control: Good   Risk Assessment: Danger to Self:  No Self-injurious Behavior: No Danger to Others: No Duty to Warn:no Physical Aggression / Violence:No  Access to Firearms a concern: Yes  Gang Involvement:No   Subjective:   Christine Rice participated from home, via video, and consented to treatment. Therapist participated from home office. We met online due to [employer] pandemic. Trevor reviewed the events of the past week.  Christine Rice noted being prescribed Cymbalta 47m qd by her PCP and noted taking her medication consistently and without complaint. She noted taking her medication consistently and without complaint. She noted inching, prior to her medication, due to anxiety. She noted a lack of awareness of the behavior but noted this causing scabs. She noted her heighten anxiety due to her strain with her mother and, separately, her brother. We continued to explore this and her avoidance to address her concerns with her mother. Therapist provided psycho-education regarding anxiety. We explored her avoidance and anxiety and ways to management. She would benefit from continued treatment. Therapist validated Christine Rice's experience and provided supportive therapy.  Interventions: Cognitive Behavioral Therapy and  Interpersonal  Diagnosis:  Generalized anxiety disorder  Treatment Plan:  Client Abilities/Strengths JNoraleeis intelligent, forthcoming, and motivated for change.   Support System: Family and friends.   Client Treatment Preferences OPT  Client Statement of Needs She discussed her goals for treatment including managing her symptoms, manage her reactions to stress, advocate more for self, and processing past events.   Treatment Level Weekly  Symptoms   Anxiety: History of panic attacks (~1.5 years ago), muscle tension, feeling nervous and on-edge, difficulty managing worry, difficulty relaxing, restlessness, irritability, feeling something awful might happen.   (Status: maintained) Depression: feeling down, lethargy, difficulty concentrating, difficulty with sleep (infrequent rumination). (Status: declined)  Goals:  JKatjaexperiences symptoms of anxiety w/depression.    Target Date: 08/20/2022 Frequency: Weekly  Progress: 5% Modality: individual    Therapist will provide referrals for additional resources as appropriate.  Therapist will provide psycho-education regarding Christine Rice's diagnosis and corresponding treatment approaches and interventions. Licensed Clinical Social Worker, [facility] LCSW will support the patient's ability to achieve the goals identified. will employ CBT, BA, Problem-solving, Solution Focused, Mindfulness,  coping skills, & other evidenced-based practices will be used to promote progress towards healthy functioning to help manage decrease symptoms associated with her diagnosis.   Reduce overall level, frequency, and intensity of the feelings of depression, anxiety and panic evidenced by decreased overall symptoms from 6 to 7 days/week to 0 to 1 days/week per client report for at least 3 consecutive months. Verbally express understanding of the relationship between feelings of depression, anxiety and their impact on thinking patterns and  behaviors. Verbalize an understanding of the role that distorted thinking plays in creating fears, excessive worry, and ruminations.  (  Christine Rice participated in the creation of the treatment plan)  [name], LCSW

## 2022-07-05 ENCOUNTER — Ambulatory Visit (HOSPITAL_BASED_OUTPATIENT_CLINIC_OR_DEPARTMENT_OTHER): Payer: Self-pay

## 2022-07-05 NOTE — Telephone Encounter (Signed)
Jacqueline Fernandez from [facility] referral dept request to speak with financial counselor.  Per comment by front desk pt will need a referral to be see. Please see permanent comment pasted below:  Please advice         Comment     11/29/21 Patient has Jacqueline Fernandez Medi-cal assigned to [facility] : [facility]) Patient needs initial authorization from theirs assigned PCP [facility] . PCP needs to obtain initial authorization from [facility]. -dsrg

## 2022-07-08 ENCOUNTER — Ambulatory Visit (INDEPENDENT_AMBULATORY_CARE_PROVIDER_SITE_OTHER): Payer: Medicaid Other | Admitting: Family Medicine

## 2022-07-08 ENCOUNTER — Encounter: Payer: Self-pay | Admitting: Family Medicine

## 2022-07-08 VITALS — BP 114/68 | HR 89 | Ht 65.75 in | Wt 173.0 lb

## 2022-07-08 DIAGNOSIS — F411 Generalized anxiety disorder: Secondary | ICD-10-CM | POA: Diagnosis not present

## 2022-07-08 DIAGNOSIS — M7918 Myalgia, other site: Secondary | ICD-10-CM

## 2022-07-08 DIAGNOSIS — Z021 Encounter for pre-employment examination: Secondary | ICD-10-CM | POA: Diagnosis not present

## 2022-07-08 DIAGNOSIS — Z111 Encounter for screening for respiratory tuberculosis: Secondary | ICD-10-CM | POA: Diagnosis not present

## 2022-07-08 NOTE — Patient Instructions (Addendum)
Stay on this dose of cymbalta for another month  If you want to go up to 60 mg then please let me know   Lab today for TB screening    No restrictions for sub teaching

## 2022-07-08 NOTE — Progress Notes (Signed)
Subjective:    Patient ID: Christine Rice, female    DOB: 08/13/1981, 41 y.o.   MRN: [id number]  HPI Pt presents for follow up of mood/anxiety  Also form for sub teaching   Wt Readings from Last 3 Encounters:  07/08/22 173 lb (78.5 kg)  06/08/22 176 lb 6.4 oz (80 kg)  02/08/22 175 lb (79.4 kg)   28.14 kg/m Loosing a little weight- better   Last visit noted anxiety was worse  We started cymbalta 30 mg daily to see if this helps mood and myofascial pain   Doing much better  Her leg pain is much better  Even getting stronger  Feeling like herself   Anxiety is better  Less anxious/ does still worry but overall better  More active now   Sleeps less but more restful sleep    Less hungry when she is hot   Med gives her some bubble guts     Has used hydroxyzine and xanax  Sees counselor  Still a little scratching-but less    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  Tdap 07/2014  Needs TB test  No h/o exposure to TB   She did a liver cleanse Tried tumeric   Hearing Screening   '500Hz'$  '1000Hz'$  '2000Hz'$  '4000Hz'$   Right ear 40 40 40 40  Left ear 40 0 0 40   Vision Screening   Right eye Left eye Both eyes  Without correction '20/25 20/30 20/20 '$  With correction      Has not noticed any hearing loss at all  No wax   Had eyes checked as well and normal -has dry eyes   Immunization History  Administered Date(s) Administered   Influenza Whole 11/03/2010   Influenza,inj,Quad PF,6+ Mos 12/02/2013   Pneumococcal Polysaccharide-23 07/14/2014   Td 03/22/1999   Tdap 07/14/2014    Patient Active Problem List   Diagnosis Date Noted   Screening-pulmonary TB 07/08/2022   Pre-employment examination 07/08/2022   Leg pain 06/08/2022   Lump of skin of right lower extremity 02/08/2022   Myofascial pain dysfunction syndrome 10/26/2021   Dizziness 10/15/2021   Joint pain 08/26/2021   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   Chronic headaches 01/10/2020   Skin lesion  06/10/2019   H/O hernia repair 06/10/2019   History of smoking 10-25 pack years 07/14/2014   Routine general medical examination at a health care facility 07/07/2014   Cesarean delivery delivered 01/19/2012   IBS 08/15/2007   History of HPV infection 08/14/2007   Anxiety disorder 08/14/2007   DEPRESSION 08/14/2007   ADD 08/14/2007   Past Medical History:  Diagnosis Date   Anxiety    Kidney failure    Nephrotic syndrome    Past Surgical History:  Procedure Laterality Date   CESAREAN SECTION  01/06/2005   CESAREAN SECTION  01/19/2012   Procedure: CESAREAN SECTION;  Surgeon: Luz Lex, MD;  Location: [facility];  Service: Gynecology;  Laterality: N/A;  repeat   HERNIA REPAIR     INGUINAL HERNIA REPAIR  01/19/2012   Procedure: HERNIA REPAIR INGUINAL ADULT BILATERAL;  Surgeon: Adin Hector, MD;  Location: [facility];  Service: General;  Laterality: Bilateral;   RENAL BIOPSY, PERCUTANEOUS  01/06/2020       tummy tuck     UMBILICAL HERNIA REPAIR  01/19/2012   Procedure: HERNIA REPAIR UMBILICAL ADULT;  Surgeon: Adin Hector, MD;  Location: [facility];  Service: General;  Laterality: N/A;   UMBILICAL HERNIA REPAIR  11/19/2014   WISDOM TOOTH EXTRACTION     Social History   Tobacco Use   Smoking status: Former    Types: Cigarettes    Quit date: 11/15/2019    Years since quitting: 2.6   Smokeless tobacco: Never  Vaping Use   Vaping Use: Former  Substance Use Topics   Alcohol use: Not Currently   Drug use: Yes    Types: Marijuana    Comment: occ- marijuana    Family History  Problem Relation Age of Onset   Rashes / Skin problems Mother    COPD Father    Stroke Father    Healthy Daughter    Healthy Daughter    Breast cancer Neg Hx    Cancer Neg Hx    Allergies  Allergen Reactions   Amoxicillin Hives    REACTION: rash  No associated shortness of breath or throat swelling   Penicillins Hives   Lipitor [Atorvastatin]     Aches and pains and fatigue   Current Outpatient  Medications on File Prior to Visit  Medication Sig Dispense Refill   ALPRAZolam (XANAX) 0.25 MG tablet TAKE 1 TABLET BY MOUTH DAILY AS NEEDED FOR ANXIETY 30 tablet 0   ASPIRIN 81 PO Take 81 mg by mouth daily.     DULoxetine (CYMBALTA) 30 MG capsule Take 1 capsule (30 mg total) by mouth daily. 30 capsule 3   fluticasone (FLONASE) 50 MCG/ACT nasal spray Place 2 sprays into both nostrils daily. 16 g 6   Glucosamine 750 MG TABS Take 1 tablet by mouth daily.     hydrOXYzine (VISTARIL) 25 MG capsule TAKE 1 CAPSULE BY MOUTH EVERY 8 HOURS ASNEEDED 90 capsule 3   levonorgestrel (MIRENA) 20 MCG/24HR IUD 1 each by Intrauterine route once.     losartan (COZAAR) 50 MG tablet TAKE ONE TABLET BY MOUTH EVERY DAY 30 tablet 5   Multiple Vitamin (MULTIVITAMIN) tablet Take 1 tablet by mouth daily.     naltrexone (DEPADE) 50 MG tablet Take by mouth daily. Pt is unsure of dose     No current facility-administered medications on file prior to visit.    Review of Systems  Constitutional:  Negative for activity change, appetite change, fatigue, fever and unexpected weight change.  HENT:  Negative for congestion, ear pain, rhinorrhea, sinus pressure and sore throat.   Eyes:  Negative for pain, redness and visual disturbance.  Respiratory:  Negative for cough, shortness of breath and wheezing.   Cardiovascular:  Negative for chest pain and palpitations.  Gastrointestinal:  Negative for abdominal pain, blood in stool, constipation and diarrhea.  Endocrine: Negative for polydipsia and polyuria.  Genitourinary:  Negative for dysuria, frequency and urgency.  Musculoskeletal:  Negative for arthralgias, back pain and myalgias.       Myalgias are significantly better   Skin:  Negative for pallor and rash.  Allergic/Immunologic: Negative for environmental allergies.  Neurological:  Negative for dizziness, syncope and headaches.  Hematological:  Negative for adenopathy. Does not bruise/bleed easily.   Psychiatric/Behavioral:  Negative for decreased concentration and dysphoric mood. The patient is nervous/anxious.        Objective:   Physical Exam Constitutional:      General: She is not in acute distress.    Appearance: Normal appearance. She is well-developed and normal weight. She is not ill-appearing or diaphoretic.  HENT:     Head: Normocephalic and atraumatic.  Eyes:     Conjunctiva/sclera: Conjunctivae normal.     Pupils: Pupils are equal,  round, and reactive to light.  Neck:     Thyroid: No thyromegaly.     Vascular: No carotid bruit or JVD.  Cardiovascular:     Rate and Rhythm: Normal rate and regular rhythm.     Heart sounds: Normal heart sounds.     No gallop.  Pulmonary:     Effort: Pulmonary effort is normal. No respiratory distress.     Breath sounds: Normal breath sounds. No wheezing or rales.  Abdominal:     General: There is no distension or abdominal bruit.     Palpations: Abdomen is soft.  Musculoskeletal:     Cervical back: Normal range of motion and neck supple.     Right lower leg: No edema.     Left lower leg: No edema.  Lymphadenopathy:     Cervical: No cervical adenopathy.  Skin:    General: Skin is warm and dry.     Coloration: Skin is not pale.     Findings: No rash.     Comments: Tanned Solar lentigines diffusely   Neurological:     Mental Status: She is alert.     Cranial Nerves: No cranial nerve deficit.     Coordination: Coordination normal.     Deep Tendon Reflexes: Reflexes are normal and symmetric. Reflexes normal.  Psychiatric:        Attention and Perception: Attention normal.        Mood and Affect: Mood normal. Mood is not anxious or depressed.     Comments: Talkative and cheerful today             Assessment & Plan:   Problem List Items Addressed This Visit       Musculoskeletal and Integument   Myofascial pain dysfunction syndrome    cymbalta 30 mg heps She also takes naltrexone from a different provider  Is  active /exercising         Other   Anxiety disorder - Primary    Much improved with cymbalta 30 mg  Also helps her chronic pain  Reviewed stressors/ coping techniques/symptoms/ support sources/ tx options and side effects in detail today Will continue this  In 1 mo she will let us know if she wants to adv dose to 60 mg  Enc good self care  Doing well with that       Pre-employment examination    Forms done for sub teaching  No restrictions  Currently healthy   Hearing in L ear is less- she is not bothered by it  Offered audiology visit later if she wants   Recommend flu shot in the fall She declines-side eff in the past      Screening-pulmonary TB    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

## 2022-07-08 NOTE — Assessment & Plan Note (Signed)
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

## 2022-07-08 NOTE — Assessment & Plan Note (Addendum)
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

## 2022-07-08 NOTE — Assessment & Plan Note (Signed)
Much improved with cymbalta 30 mg  Also helps her chronic pain  Reviewed stressors/ coping techniques/symptoms/ support sources/ tx options and side effects in detail today Will continue this  In 1 mo she will let us know if she wants to adv dose to 60 mg  Enc good self care  Doing well with that

## 2022-07-08 NOTE — Assessment & Plan Note (Signed)
cymbalta 30 mg heps She also takes naltrexone from a different provider  Is active /exercising

## 2022-07-09 ENCOUNTER — Other Ambulatory Visit: Payer: Self-pay | Admitting: Family Medicine

## 2022-07-11 ENCOUNTER — Ambulatory Visit (INDEPENDENT_AMBULATORY_CARE_PROVIDER_SITE_OTHER): Payer: Medicaid Other | Admitting: Psychology

## 2022-07-11 DIAGNOSIS — F411 Generalized anxiety disorder: Secondary | ICD-10-CM | POA: Diagnosis not present

## 2022-07-11 NOTE — Telephone Encounter (Signed)
Last filled 02/09/22 Last ov07/28/23

## 2022-07-11 NOTE — Progress Notes (Signed)
[facility] Counselor/Therapist Progress Note  Patient ID: Christine Rice, MRN: [id number]    Date: 07/11/22  Time Spent: 1:36 pm - 2:36 pm : 60 Minutes  Treatment Type: Individual Therapy.  Reported Symptoms: Anxiety.  Mental Status Exam: Appearance:  Neat     Behavior: Appropriate  Motor: Normal  Speech/Language:  Clear and Coherent  Affect: Congruent  Mood: anxious  Thought process: normal  Thought content:   WNL  Sensory/Perceptual disturbances:   WNL  Orientation: oriented to person, place, time/date, and situation  Attention: Good  Concentration: Good  Memory: WNL  Fund of knowledge:  Good  Insight:   Good  Judgment:  Good  Impulse Control: Good   Risk Assessment: Danger to Self:  No Self-injurious Behavior: No Danger to Others: No Duty to Warn:no Physical Aggression / Violence:No  Access to Firearms a concern: Yes  Gang Involvement:No   Subjective:   Christine Rice participated from home, via video, and consented to treatment. Therapist participated from office. We met online due to [place] pandemic. Bodhi reviewed the events of the past week. She noted her medication, Cymbalta, working well to manage her pain. She noted improvement in energy and mood, as well. She noted continued stressors with her mother and brother. She noted some anxiety regarding her daughter's. We processed and explored this, during the session. Additionally, we explored her avoidance to discuss concerns with mother. We roleplayed ways to communicate positively and assertively. Celicia was engaged and motivated during the session. Therapist validated Kathi's experience and provided supportive therapy.  Interventions: Cognitive Behavioral Therapy and Interpersonal  Diagnosis:  Generalized anxiety disorder  Treatment Plan:  Client Abilities/Strengths Harvey is intelligent, forthcoming, and motivated for change.   Support System: Family and friends.   Client  Treatment Preferences OPT  Client Statement of Needs She discussed her goals for treatment including managing her symptoms, manage her reactions to stress, advocate more for self, and processing past events.   Treatment Level Weekly  Symptoms   Anxiety: History of panic attacks (~1.5 years ago), muscle tension, feeling nervous and on-edge, difficulty managing worry, difficulty relaxing, restlessness, irritability, feeling something awful might happen.   (Status: declined) Depression: feeling down, lethargy, difficulty concentrating, difficulty with sleep (infrequent rumination). (Status: improved)  Goals:  [name] experiences symptoms of anxiety w/depression.    Target Date: 08/20/2022 Frequency: Weekly  Progress: 5% Modality: individual    Therapist will provide referrals for additional resources as appropriate.  Therapist will provide psycho-education regarding Rosilyn's diagnosis and corresponding treatment approaches and interventions. Licensed Clinical Social Worker, [place], LCSW will support the patient's ability to achieve the goals identified. will employ CBT, BA, Problem-solving, Solution Focused, Mindfulness,  coping skills, & other evidenced-based practices will be used to promote progress towards healthy functioning to help manage decrease symptoms associated with her diagnosis.   Reduce overall level, frequency, and intensity of the feelings of depression, anxiety and panic evidenced by decreased overall symptoms from 6 to 7 days/week to 0 to 1 days/week per client report for at least 3 consecutive months. Verbally express understanding of the relationship between feelings of depression, anxiety and their impact on thinking patterns and behaviors. Verbalize an understanding of the role that distorted thinking plays in creating fears, excessive worry, and ruminations.  [name] participated in the creation of the treatment plan)  [name], LCSW

## 2022-07-13 ENCOUNTER — Telehealth: Payer: Self-pay | Admitting: Family Medicine

## 2022-07-13 NOTE — Telephone Encounter (Signed)
Patient called and said she had a TB test done last week and was curious if it had come back yet. Please advise. Call back is [phone]

## 2022-07-14 LAB — QUANTIFERON-TB GOLD PLUS
Mitogen-NIL: 7.43 IU/mL
NIL: 0.04 IU/mL
QuantiFERON-TB Gold Plus: NEGATIVE
TB1-NIL: 0.01 IU/mL
TB2-NIL: 0.02 IU/mL

## 2022-07-14 NOTE — Telephone Encounter (Signed)
Results are back pt was to know the results

## 2022-07-14 NOTE — Telephone Encounter (Signed)
Just came back negative (that took a while!)  I will finish her paperwork from the visit

## 2022-07-14 NOTE — Telephone Encounter (Signed)
Okay; thanks.

## 2022-07-25 ENCOUNTER — Ambulatory Visit (INDEPENDENT_AMBULATORY_CARE_PROVIDER_SITE_OTHER): Payer: Medicaid Other | Admitting: Psychology

## 2022-07-25 DIAGNOSIS — F411 Generalized anxiety disorder: Secondary | ICD-10-CM

## 2022-07-25 NOTE — Progress Notes (Signed)
[facility] Counselor/Therapist Progress Note  Patient ID: Christine Rice, MRN: [id number]    Date: 07/25/22  Time Spent: 12:40 pm - 1:30 pm : 50 Minutes  Treatment Type: Individual Therapy.  Reported Symptoms: Anxiety.  Mental Status Exam: Appearance:  Neat     Behavior: Appropriate  Motor: Normal  Speech/Language:  Clear and Coherent  Affect: Congruent  Mood: anxious  Thought process: normal  Thought content:   WNL  Sensory/Perceptual disturbances:   WNL  Orientation: oriented to person, place, time/date, and situation  Attention: Good  Concentration: Good  Memory: WNL  Fund of knowledge:  Good  Insight:   Good  Judgment:  Good  Impulse Control: Good   Risk Assessment: Danger to Self:  No Self-injurious Behavior: No Danger to Others: No Duty to Warn:no Physical Aggression / Violence:No  Access to Firearms a concern: Yes  Gang Involvement:No   Subjective:   Christine Rice participated from car, via video, and consented to treatment. Therapist participated from home office. We met online due to [place] pandemic. Christine Rice reviewed the events of the past week. She noted reaching out to her mother, communicating feelings, and setting boundaries. She noted frustration regarding her daughter's behavior and noted feeling some regret regarding her approach. We processed this during the session. She noted feeling unprioritized in varying areas in her life. We explored this during the session and discussed ways to manage her overall frustration while addressing individual stressors with varying people. We discussed ways to be mindful of her mood and frustrations as she interacts with others. We discussed her trepidation to discuss her needs with her significant other and the effect this has on her mood. Christine Rice discussed her relationship, at times, being surface level. We will work on exploring this during the  follow-up sessions. Christine Rice was engaged and motivated during  the session. Therapist validated Christine Rice's experience and provided supportive therapy.  Interventions: Cognitive Behavioral Therapy and Interpersonal  Diagnosis:  Generalized anxiety disorder  Treatment Plan:  Client Abilities/Strengths Christine Rice is intelligent, forthcoming, and motivated for change.   Support System: Family and friends.   Client Treatment Preferences OPT  Client Statement of Needs She discussed her goals for treatment including managing her symptoms, manage her reactions to stress, advocate more for self, and processing past events.   Treatment Level Weekly  Symptoms   Anxiety: History of panic attacks (~1.5 years ago), muscle tension, feeling nervous and on-edge, difficulty managing worry, difficulty relaxing, restlessness, irritability, feeling something awful might happen.   (Status: declined) Depression: feeling down, lethargy, difficulty concentrating, difficulty with sleep (infrequent rumination). (Status: improved)  Goals:  Christine Rice experiences symptoms of anxiety w/depression.    Target Date: 08/20/2022 Frequency: Weekly  Progress: 5% Modality: individual    Therapist will provide referrals for additional resources as appropriate.  Therapist will provide psycho-education regarding Christine Rice's diagnosis and corresponding treatment approaches and interventions. Licensed Clinical Social Worker, [place], LCSW will support the patient's ability to achieve the goals identified. will employ CBT, BA, Problem-solving, Solution Focused, Mindfulness,  coping skills, & other evidenced-based practices will be used to promote progress towards healthy functioning to help manage decrease symptoms associated with her diagnosis.   Reduce overall level, frequency, and intensity of the feelings of depression, anxiety and panic evidenced by decreased overall symptoms from 6 to 7 days/week to 0 to 1 days/week per client report for at least 3 consecutive months. Verbally  express understanding of the relationship between feelings of depression, anxiety and their impact  on thinking patterns and behaviors. Verbalize an understanding of the role that distorted thinking plays in creating fears, excessive worry, and ruminations.  Christine Rice participated in the creation of the treatment plan)  [name], LCSW

## 2022-08-02 IMAGING — MG MM DIGITAL SCREENING BILAT W/ TOMO AND CAD
8 series · 8 of 24 positions shown · non-contrast
Comparison: None.

CLINICAL DATA: Screening.

EXAM:
DIGITAL SCREENING BILATERAL MAMMOGRAM WITH TOMOSYNTHESIS AND CAD
TECHNIQUE: Bilateral screening digital craniocaudal and mediolateral oblique
mammograms were obtained. Bilateral screening digital breast
tomosynthesis was performed. The images were evaluated with
computer-aided detection.

[R CC synth-2D]
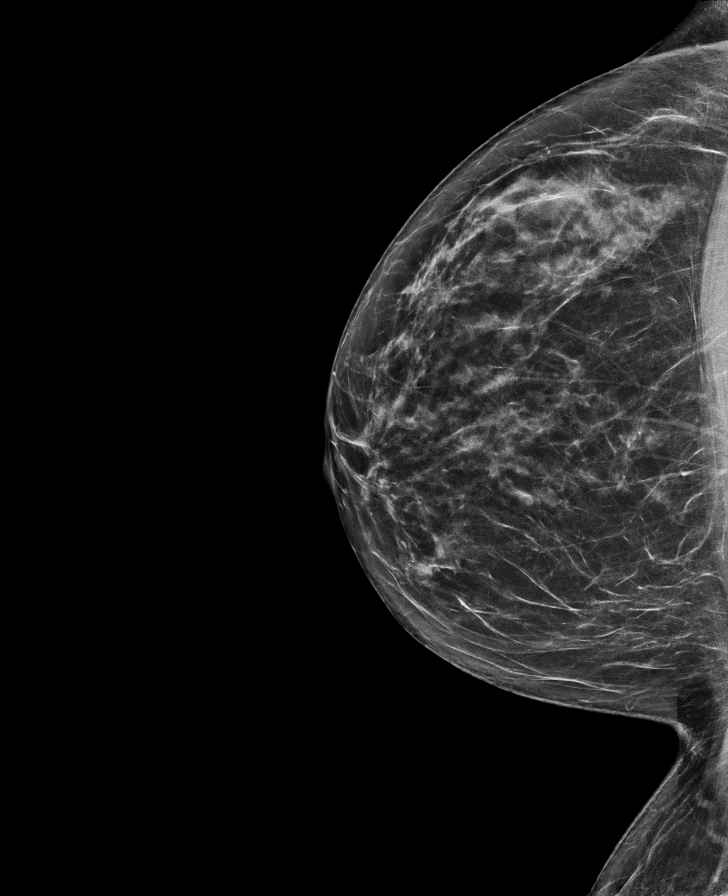

[R MLO synth-2D]
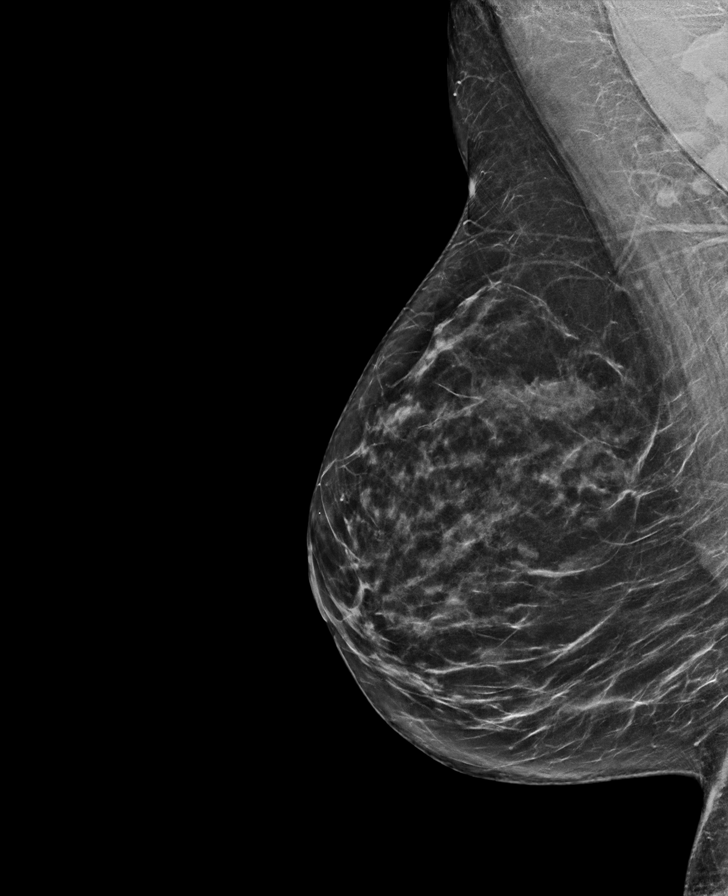

[L CC synth-2D]
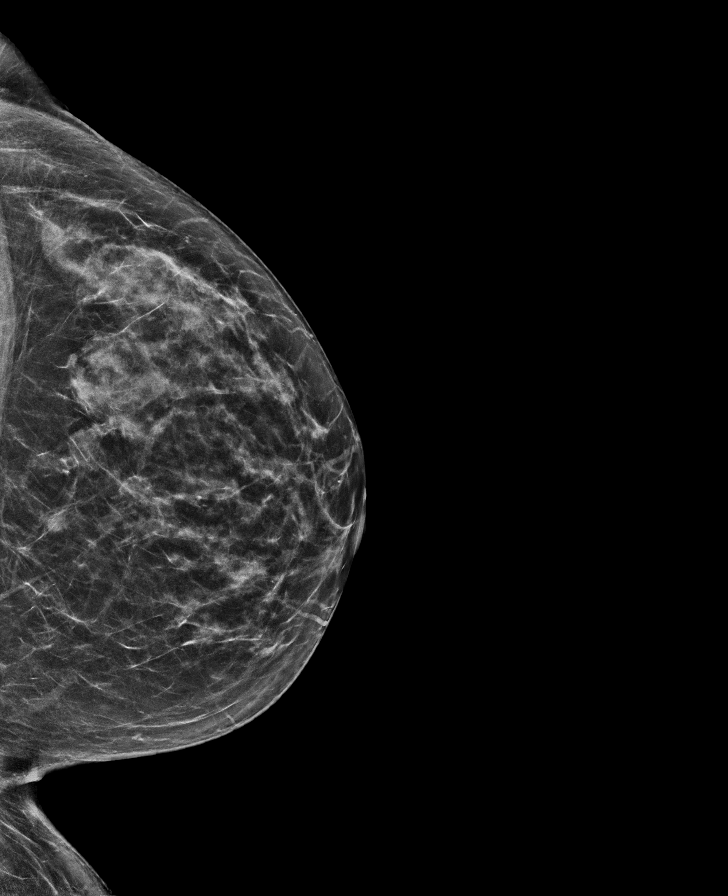

[L MLO synth-2D]
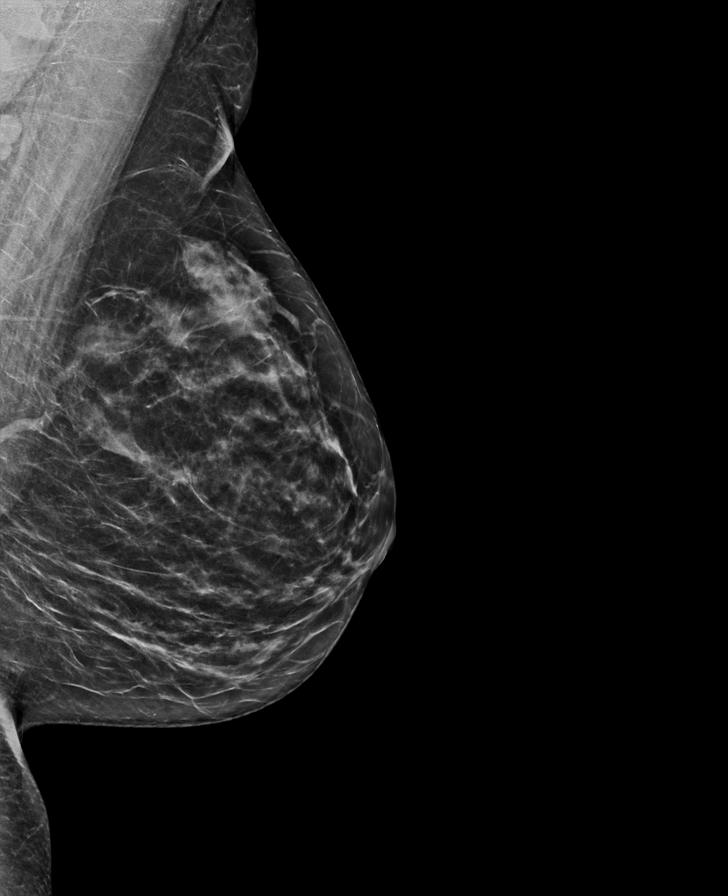

[R MLO tomo · tomo slice 35/69.0]
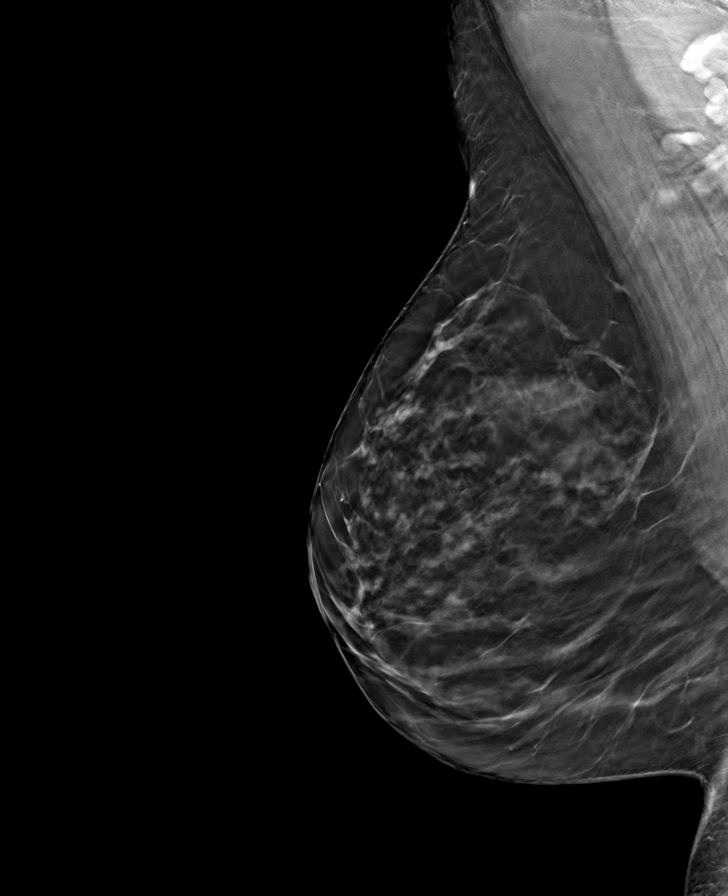

[R CC tomo · tomo slice 34/67.0]
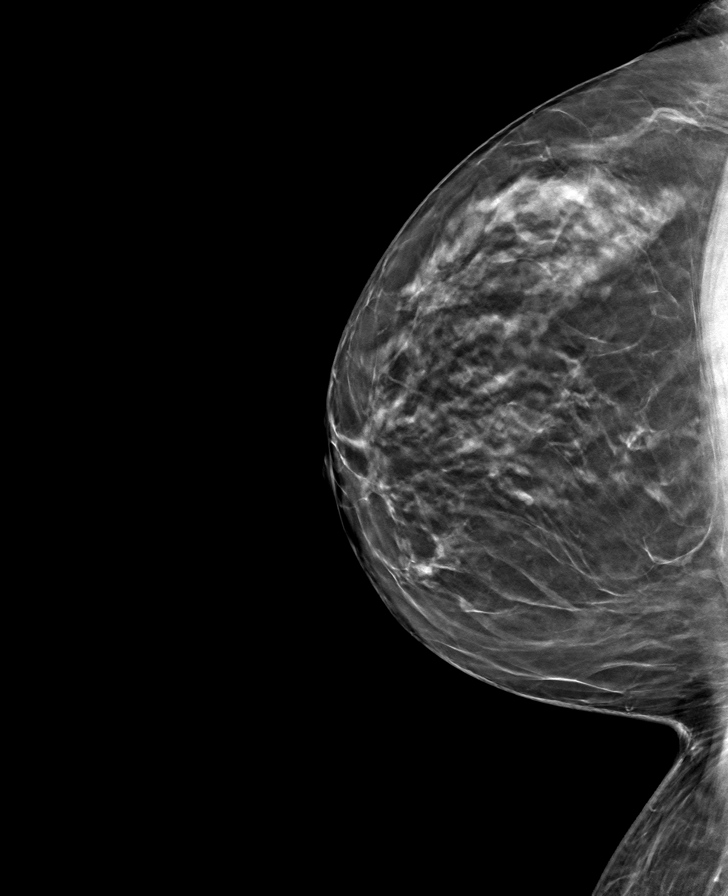

[L CC tomo · tomo slice 31/62.0]
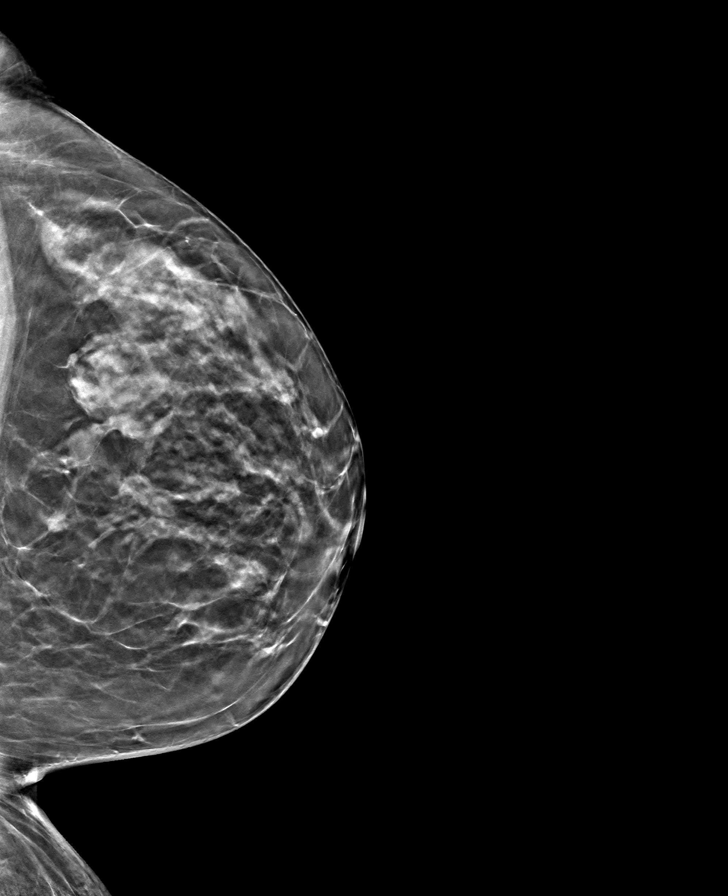

[L MLO tomo · tomo slice 34/67.0]
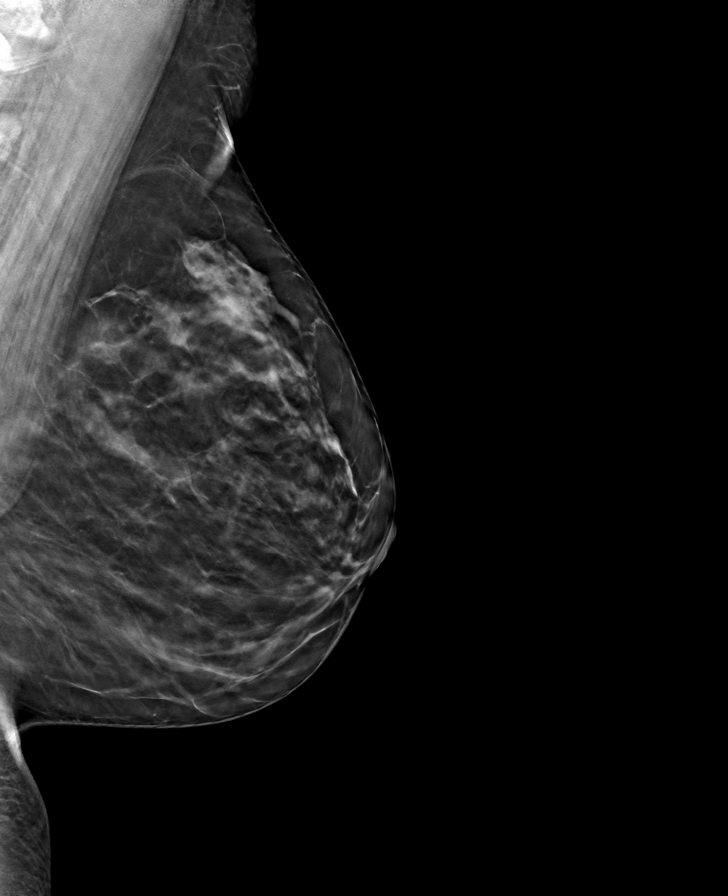

[8 of 24 positions shown; findings below may reference images not displayed]

ACR Breast Density Category c: The breast tissue is heterogeneously
dense, which may obscure small masses.
FINDINGS: In the left breast, a possible asymmetry and mass warrants further
evaluation. In the right breast, no findings suspicious for
malignancy.
IMPRESSION: Further evaluation is suggested for possible asymmetry and in the
left breast.

RECOMMENDATION:
Diagnostic mammogram and possibly ultrasound of the left breast.
(Code:AS-B-WWX)

The patient will be contacted regarding the findings, and additional
imaging will be scheduled.

BI-RADS CATEGORY  0: Incomplete. Need additional imaging evaluation
and/or prior mammograms for comparison.

## 2022-08-08 ENCOUNTER — Ambulatory Visit (INDEPENDENT_AMBULATORY_CARE_PROVIDER_SITE_OTHER): Payer: Medicaid Other | Admitting: Psychology

## 2022-08-08 DIAGNOSIS — F411 Generalized anxiety disorder: Secondary | ICD-10-CM

## 2022-08-08 NOTE — Progress Notes (Signed)
[facility] Counselor/Therapist Progress Note  Patient ID: Christine Rice, MRN: [id number]    Date: 08/08/22  Time Spent: 3:04 pm - 3:59pm : 59 Minutes  Treatment Type: Individual Therapy.  Reported Symptoms: Anxiety.  Mental Status Exam: Appearance:  Neat     Behavior: Appropriate  Motor: Normal  Speech/Language:  Clear and Coherent  Affect: Congruent  Mood: anxious  Thought process: normal  Thought content:   WNL  Sensory/Perceptual disturbances:   WNL  Orientation: oriented to person, place, time/date, and situation  Attention: Good  Concentration: Good  Memory: WNL  Fund of knowledge:  Good  Insight:   Good  Judgment:  Good  Impulse Control: Good   Risk Assessment: Danger to Self:  No Self-injurious Behavior: No Danger to Others: No Duty to Warn:no Physical Aggression / Violence:No  Access to Firearms a concern: Yes  Gang Involvement:No   Subjective:   Christine Rice participated from home, via video, and consented to treatment. Therapist participated from home office. We met online due to [place] pandemic. Christine Rice reviewed the events of the past week. Christine Rice noted some interpersonal stressors with her boyfriend, while purchasing a car. She noted having disturbed dreams about her ex-boyfriend. She noted having "normal" conversation with her mother after her previous strain. She noted a history of "eliminating" people from her circle when she is mistreated. She noted feeling as though people do not "value" relationships and we explored this. Therapist highlighted Christine Rice's own approach, which communicates similarly, and explored this dissonance. We worked on exploring this during the session. She noted not expressing her self positively during times of stress. We rescripted this past experience and discussed alternative ways to interact. We discussed ways to engage more positively and consistently.  Therapist encouraged Christine Rice to identify more adaptive  boundaries going forward to be processed, going forward. Therapist encouraged Christine Rice to identify the pros and cons of her current approach. Therapist praised Christine Rice for her effort during the session and provided supportive therapy. Christine Rice continues to benefit from counseling on a consistent basis.   Interventions: Cognitive Behavioral Therapy and Interpersonal  Diagnosis:  Generalized anxiety disorder  Treatment Plan:  Client Abilities/Strengths Christine Rice is intelligent, forthcoming, and motivated for change.   Support System: Family and friends.   Client Treatment Preferences OPT  Client Statement of Needs She discussed her goals for treatment including managing her symptoms, manage her reactions to stress, advocate more for self, and processing past events.   Treatment Level Weekly  Symptoms   Anxiety: History of panic attacks (~1.5 years ago), muscle tension, feeling nervous and on-edge, difficulty managing worry, difficulty relaxing, restlessness, irritability, feeling something awful might happen.   (Status: declined) Depression: feeling down, lethargy, difficulty concentrating, difficulty with sleep (infrequent rumination). (Status: improved)  Goals:  Phylicia experiences symptoms of anxiety w/depression.    Target Date: 08/20/2022 Frequency: Weekly  Progress: 5% Modality: individual    Therapist will provide referrals for additional resources as appropriate.  Therapist will provide psycho-education regarding Danyka's diagnosis and corresponding treatment approaches and interventions. Licensed Clinical Social Worker, [place], LCSW will support the patient's ability to achieve the goals identified. will employ CBT, BA, Problem-solving, Solution Focused, Mindfulness,  coping skills, & other evidenced-based practices will be used to promote progress towards healthy functioning to help manage decrease symptoms associated with her diagnosis.   Reduce overall level,  frequency, and intensity of the feelings of depression, anxiety and panic evidenced by decreased overall symptoms from 6 to 7 days/week to  0 to 1 days/week per client report for at least 3 consecutive months. Verbally express understanding of the relationship between feelings of depression, anxiety and their impact on thinking patterns and behaviors. Verbalize an understanding of the role that distorted thinking plays in creating fears, excessive worry, and ruminations.  Christine Rice participated in the creation of the treatment plan)  [name], LCSW

## 2022-08-18 ENCOUNTER — Ambulatory Visit (INDEPENDENT_AMBULATORY_CARE_PROVIDER_SITE_OTHER): Payer: Medicaid Other | Admitting: Dermatology

## 2022-08-18 DIAGNOSIS — L578 Other skin changes due to chronic exposure to nonionizing radiation: Secondary | ICD-10-CM

## 2022-08-18 DIAGNOSIS — L820 Inflamed seborrheic keratosis: Secondary | ICD-10-CM | POA: Diagnosis not present

## 2022-08-18 DIAGNOSIS — L821 Other seborrheic keratosis: Secondary | ICD-10-CM

## 2022-08-18 DIAGNOSIS — D1723 Benign lipomatous neoplasm of skin and subcutaneous tissue of right leg: Secondary | ICD-10-CM

## 2022-08-18 NOTE — Patient Instructions (Addendum)
Pre-Operative Instructions  You are scheduled for a surgical procedure at [facility]. We recommend you read the following instructions. If you have any questions or concerns, please call the office at [phone].  Shower and wash the entire body with soap and water the day of your surgery paying special attention to cleansing at and around the planned surgery site.  Avoid aspirin or aspirin containing products at least fourteen (14) days prior to your surgical procedure and for at least one week (7 Days) after your surgical procedure. If you take aspirin on a regular basis for heart disease or history of stroke or for any other reason, we may recommend you continue taking aspirin but please notify us if you take this on a regular basis. Aspirin can cause more bleeding to occur during surgery as well as prolonged bleeding and bruising after surgery.   Avoid other nonsteroidal pain medications at least one week prior to surgery and at least one week prior to your surgery. These include medications such as Ibuprofen (Motrin, Advil and Nuprin), Naprosyn, Voltaren, Relafen, etc. If medications are used for therapeutic reasons, please inform us as they can cause increased bleeding or prolonged bleeding during and bruising after surgical procedures.   Please advise [place] if you are taking any "blood thinner" medications such as Coumadin or Dipyridamole or Plavix or similar medications. These cause increased bleeding and prolonged bleeding during procedures and bruising after surgical procedures. We may have to consider discontinuing these medications briefly prior to and shortly after your surgery if safe to do so.   Please inform us of all medications you are currently taking. All medications that are taken regularly should be taken the day of surgery as you always do. Nevertheless, we need to be informed of what medications you are taking prior to surgery to know whether they will  affect the procedure or cause any complications.   Please inform us of any medication allergies. Also inform us of whether you have allergies to Latex or rubber products or whether you have had any adverse reaction to Lidocaine or Epinephrine.  Please inform us of any prosthetic or artificial body parts such as artificial heart valve, joint replacements, etc., or similar condition that might require preoperative antibiotics.   We recommend avoidance of alcohol at least two weeks prior to surgery and continued avoidance for at least two weeks after surgery.   We recommend discontinuation of tobacco smoking at least two weeks prior to surgery and continued abstinence for at least two weeks after surgery.  Do not plan strenuous exercise, strenuous work or strenuous lifting for approximately four weeks after your surgery.   We request if you are unable to make your scheduled surgical appointment, please call us at least a week in advance or as soon as you are aware of a problem so that we can cancel or reschedule the appointment.   You MAY TAKE TYLENOL (acetaminophen) for pain as it is not a blood thinner.   PLEASE PLAN TO BE IN TOWN FOR TWO WEEKS FOLLOWING SURGERY, THIS IS IMPORTANT SO YOU CAN BE CHECKED FOR DRESSING CHANGES, SUTURE REMOVAL AND TO MONITOR FOR POSSIBLE COMPLICATIONS.     Seborrheic Keratosis  What causes seborrheic keratoses? Seborrheic keratoses are harmless, common skin growths that first appear during adult life.  As time goes by, more growths appear.  Some people may develop a large number of them.  Seborrheic keratoses appear on both covered and uncovered  body parts.  They are not caused by sunlight.  The tendency to develop seborrheic keratoses can be inherited.  They vary in color from skin-colored to gray, brown, or even [place].  They can be either smooth or have a rough, warty surface.   Seborrheic keratoses are superficial and look as if they were stuck on the skin.   Under the microscope this type of keratosis looks like layers upon layers of skin.  That is why at times the top layer may seem to fall off, but the rest of the growth remains and re-grows.    Treatment Seborrheic keratoses do not need to be treated, but can easily be removed in the office.  Seborrheic keratoses often cause symptoms when they rub on clothing or jewelry.  Lesions can be in the way of shaving.  If they become inflamed, they can cause itching, soreness, or burning.  Removal of a seborrheic keratosis can be accomplished by freezing, burning, or surgery. If any spot bleeds, scabs, or grows rapidly, please return to have it checked, as these can be an indication of a skin cancer.  Cryotherapy Aftercare  Wash gently with soap and water everyday.   Apply Vaseline and Band-Aid daily until healed.      Melanoma ABCDEs  Melanoma is the most dangerous type of skin cancer, and is the leading cause of death from skin disease.  You are more likely to develop melanoma if you: Have light-colored skin, light-colored eyes, or red or blond hair Spend a lot of time in the sun Tan regularly, either outdoors or in a tanning bed Have had blistering sunburns, especially during childhood Have a close family member who has had a melanoma Have atypical moles or large birthmarks  Early detection of melanoma is key since treatment is typically straightforward and cure rates are extremely high if we catch it early.   The first sign of melanoma is often a change in a mole or a new dark spot.  The ABCDE system is a way of remembering the signs of melanoma.  A for asymmetry:  The two halves do not match. B for border:  The edges of the growth are irregular. C for color:  A mixture of colors are present instead of an even brown color. D for diameter:  Melanomas are usually (but not always) greater than 18m - the size of a pencil eraser. E for evolution:  The spot keeps changing in size, shape, and  color.  Please check your skin once per month between visits. You can use a small mirror in front and a large mirror behind you to keep an eye on the back side or your body.   If you see any new or changing lesions before your next follow-up, please call to schedule a visit.  Please continue daily skin protection including broad spectrum sunscreen SPF 30+ to sun-exposed areas, reapplying every 2 hours as needed when you're outdoors.   Staying in the shade or wearing long sleeves, sun glasses (UVA+UVB protection) and wide brim hats (4-inch brim around the entire circumference of the hat) are also recommended for sun protection.          Due to recent changes in healthcare laws, you may see results of your pathology and/or laboratory studies on MyChart before the doctors have had a chance to review them. We understand that in some cases there may be results that are confusing or concerning to you. Please understand that not all results are received  at the same time and often the doctors may need to interpret multiple results in order to provide you with the best plan of care or course of treatment. Therefore, we ask that you please give [place] 2 business days to thoroughly review all your results before contacting the office for clarification. Should we see a critical lab result, you will be contacted sooner.   If You Need Anything After Your Visit  If you have any questions or concerns for your doctor, please call our main line at [phone] and press option 4 to reach your doctor's medical assistant. If no one answers, please leave a voicemail as directed and we will return your call as soon as possible. Messages left after 4 pm will be answered the following business day.   You may also send [place] a message via [place]. We typically respond to MyChart messages within 1-2 business days.  For prescription refills, please ask your pharmacy to contact our office. Our fax number is  [phone].  If you have an urgent issue when the clinic is closed that cannot wait until the next business day, you can page your doctor at the number below.    Please note that while we do our best to be available for urgent issues outside of office hours, we are not available 24/7.   If you have an urgent issue and are unable to reach [place], you may choose to seek medical care at your doctor's office, retail clinic, urgent care center, or emergency room.  If you have a medical emergency, please immediately call 911 or go to the emergency department.  Pager Numbers  - Dr. Nehemiah Massed: [phone]  - Dr. Laurence Ferrari: [phone]  - Dr. [name] Kindred: [phone]  In the event of inclement weather, please call our main line at [phone] for an update on the status of any delays or closures.  Dermatology Medication Tips: Please keep the boxes that topical medications come in in order to help keep track of the instructions about where and how to use these. Pharmacies typically print the medication instructions only on the boxes and not directly on the medication tubes.   If your medication is too expensive, please contact our office at [phone] option 4 or send [place] a message through [place].   We are unable to tell what your co-pay for medications will be in advance as this is different depending on your insurance coverage. However, we may be able to find a substitute medication at lower cost or fill out paperwork to get insurance to cover a needed medication.   If a prior authorization is required to get your medication covered by your insurance company, please allow [place] 1-2 business days to complete this process.  Drug prices often vary depending on where the prescription is filled and some pharmacies may offer cheaper prices.  The website www.goodrx.com contains coupons for medications through different pharmacies. The prices here do not account for what the cost may be with help from  insurance (it may be cheaper with your insurance), but the website can give you the price if you did not use any insurance.  - You can print the associated coupon and take it with your prescription to the pharmacy.  - You may also stop by our office during regular business hours and pick up a GoodRx coupon card.  - If you need your prescription sent electronically to a different pharmacy, notify our office through [facility] or by phone at [phone] option 4.  Si Usted Necesita Algo Despus de Su Visita  Tambin puede enviarnos un mensaje a travs de Pharmacist, community. Por lo general respondemos a los mensajes de MyChart en el transcurso de 1 a 2 das hbiles.  Para renovar recetas, por favor pida a su farmacia que se ponga en contacto con nuestra oficina. Harland Dingwall de fax es [place] [phone].  Si tiene un asunto urgente cuando la clnica est cerrada y que no puede esperar hasta el siguiente da hbil, puede llamar/localizar a su doctor(a) al nmero que aparece a continuacin.   Por favor, tenga en cuenta que aunque hacemos todo lo posible para estar disponibles para asuntos urgentes fuera del horario de [place], no estamos disponibles las 24 horas del da, los 7 das de la [place].   Si tiene un problema urgente y no puede comunicarse con nosotros, puede optar por buscar atencin mdica  en el consultorio de su doctor(a), en una clnica privada, en un centro de atencin urgente o en una sala de emergencias.  Si tiene Engineering geologist, por favor llame inmediatamente al 911 o vaya a la sala de emergencias.  Nmeros de bper  - Dr. Nehemiah Massed: [phone]  - Dra. Moye: [phone]  - Dra. [name] Kindred: [phone]  En caso de inclemencias del [place], por favor llame a Johnsie Kindred principal al [phone] para una actualizacin sobre el [place] de cualquier retraso o cierre.  Consejos para la medicacin en dermatologa: Por favor, guarde las cajas en las que vienen los  medicamentos de uso tpico para ayudarle a seguir las instrucciones sobre dnde y cmo usarlos. Las farmacias generalmente imprimen las instrucciones del medicamento slo en las cajas y no directamente en los tubos del [place].   Si su medicamento es muy caro, por favor, pngase en contacto con Zigmund Daniel llamando al [phone] y presione la opcin 4 o envenos un mensaje a travs de Pharmacist, community.   No podemos decirle cul ser su copago por los medicamentos por adelantado ya que esto es diferente dependiendo de la cobertura de su seguro. Sin embargo, es posible que podamos encontrar un medicamento sustituto a Electrical engineer un formulario para que el seguro cubra el medicamento que se considera necesario.   Si se requiere una autorizacin previa para que su compaa de seguros [place] su medicamento, por favor permtanos de 1 a 2 das hbiles para completar este proceso.  Los precios de los medicamentos varan con frecuencia dependiendo del Environmental consultant de dnde se surte la receta y alguna farmacias pueden ofrecer precios ms baratos.  El sitio web www.goodrx.com tiene cupones para medicamentos de Airline pilot. Los precios aqu no tienen en cuenta lo que podra costar con la ayuda del seguro (puede ser ms barato con su seguro), pero el sitio web puede darle el precio si no utiliz Research scientist (physical sciences).  - Puede imprimir el cupn correspondiente y llevarlo con su receta a la farmacia.  - Tambin puede pasar por nuestra oficina durante el horario de atencin regular y Charity fundraiser una tarjeta de cupones de GoodRx.  - Si necesita que su receta se enve electrnicamente a una farmacia diferente, informe a nuestra oficina a travs de MyChart de [place] o por telfono llamando al [phone] y presione la opcin 4.

## 2022-08-18 NOTE — Progress Notes (Unsigned)
New Patient Visit  Subjective  Christine Rice is a 41 y.o. female who presents for the following: New Patient (Initial Visit) (Concerned about lump at right lower leg, spot at left lower leg, and spots at left side of forehead/temple. Denies any family or personal history of skin cancer). The patient has spots, moles and lesions to be evaluated, some may be new or changing and the patient has concerns that these could be cancer.  The following portions of the chart were reviewed this encounter and updated as appropriate:   Tobacco  Allergies  Meds  Problems  Med Hx  Surg Hx  Fam Hx     Review of Systems:  No other skin or systemic complaints except as noted in HPI or Assessment and Plan.  Objective  Well appearing patient in no apparent distress; mood and affect are within normal limits.  A focused examination was performed including face, left leg, right leg. Relevant physical exam findings are noted in the Assessment and Plan.  right proximal lateral calf 1.5 cm subcutaneous nodule.   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 Exam most consistent with an lipoma.  Discussed that a lipoma is a benign growth that can grow over time and sometimes get irritated or inflamed. Recommend observation if it is not bothersome. Discussed option of surgical excision to remove it if it is growing, symptomatic, or other changes noted. Please call for new or changing lesions so they can be evaluated.  Patient reports areas is growing   Lipoma with symptoms and/or recent change.  Discussed surgical excision to remove, including resulting scar and possible recurrence.  Patient will schedule for surgery. Pre-op information given.  Inflamed seborrheic keratosis (5) left zygoma x 1, right temple x 3, left proximal lateral  calf x 1 Symptomatic, irritating, patient would like treated.  Vs wart at left proximal lateral calf   Destruction of lesion - left zygoma x 1, right temple x 3, left proximal lateral calf x 1 Complexity: simple   Destruction method: cryotherapy   Informed consent: discussed and consent obtained   Timeout:  patient name, date of birth, surgical site, and procedure verified Lesion destroyed using liquid nitrogen: Yes   Region frozen until ice ball extended beyond lesion: Yes   Outcome: patient tolerated procedure well with no complications   Post-procedure details: wound care instructions given   Additional details:  Prior to procedure, discussed risks of blister formation, small wound, skin dyspigmentation, or rare scar following cryotherapy. Recommend Vaseline ointment to treated areas while healing.  Seborrheic Keratoses - Stuck-on, waxy, tan-brown papules and/or plaques  - Benign-appearing - Discussed benign etiology and prognosis. - Observe - Call for any changes  Actinic Damage - chronic, secondary to cumulative UV radiation exposure/sun exposure over time - diffuse scaly erythematous macules with underlying dyspigmentation - Recommend daily broad spectrum sunscreen SPF 30+ to sun-exposed areas, reapply every 2 hours as needed.  - Recommend staying in the shade or wearing long sleeves, sun glasses (UVA+UVB protection) and wide brim hats (4-inch brim around the entire circumference of the hat). - Call for new or changing lesions.  Return for surgery to removal lipoma at right lateral proximal calf .  Garry Heater, CMA, am acting as scribe for Sarina Ser, MD. Documentation: I have reviewed the above documentation  for accuracy and completeness, and I agree with the above.  Sarina Ser, MD

## 2022-08-19 ENCOUNTER — Encounter: Payer: Self-pay | Admitting: Family Medicine

## 2022-08-19 MED ORDER — DULOXETINE HCL 60 MG PO CPEP: 60.0000 mg | ORAL_CAPSULE | Freq: Every day | ORAL | 11 refills | Status: DC

## 2022-08-23 ENCOUNTER — Ambulatory Visit (INDEPENDENT_AMBULATORY_CARE_PROVIDER_SITE_OTHER): Payer: Medicaid Other | Admitting: Psychology

## 2022-08-23 ENCOUNTER — Encounter: Payer: Self-pay | Admitting: Dermatology

## 2022-08-23 DIAGNOSIS — F411 Generalized anxiety disorder: Secondary | ICD-10-CM

## 2022-08-23 DIAGNOSIS — N050 Unspecified nephritic syndrome with minor glomerular abnormality: Secondary | ICD-10-CM | POA: Diagnosis not present

## 2022-08-23 DIAGNOSIS — R809 Proteinuria, unspecified: Secondary | ICD-10-CM | POA: Diagnosis not present

## 2022-08-23 NOTE — Progress Notes (Addendum)
[facility] Counselor/Therapist Progress Note  Patient ID: Christine Rice, MRN: [id number]    Date: 08/23/22  Time Spent: 1:06 pm - 2:00 pm : 54 Minutes  Treatment Type: Individual Therapy.  Reported Symptoms: Anxiety.  Mental Status Exam: Appearance:  Neat     Behavior: Appropriate  Motor: Normal  Speech/Language:  Clear and Coherent  Affect: Congruent  Mood: anxious  Thought process: normal  Thought content:   WNL  Sensory/Perceptual disturbances:   WNL  Orientation: oriented to person, place, time/date, and situation  Attention: Good  Concentration: Good  Memory: WNL  Fund of knowledge:  Good  Insight:   Good  Judgment:  Good  Impulse Control: Good   Risk Assessment: Danger to Self:  No Self-injurious Behavior: No Danger to Others: No Duty to Warn:no Physical Aggression / Violence:No  Access to Firearms a concern: Yes  Gang Involvement:No   Subjective:   KEEGAN BENSCH participated from home, via video, and consented to treatment. Therapist participated from office. We met online due to [place] pandemic. Lanelle reviewed the events of the past week. She noted interpersonal stressors with a friend who she had a falling-out with. We explored this experience during the session and worked on identifying her own feelings. We delineated what she would like to say and practiced assertive and positive communication.  Therapist challenged assumptions regarding people in relation to people's thoughts and feelings which were largely uncommunicative.  We worked on ways to use empathy, asked questions, and reduce Subutex delusions.  Therapist modeled this during the session.  Rotha endorsed increased anxiety in relation to resolving conflict and waiting for people's responses.  We will focus on this going forward.  Therapist praised Walta for her effort and energy during the session and provided supportive therapy.   Interventions: Cognitive Behavioral Therapy and  Interpersonal  Diagnosis:  Generalized anxiety disorder  Treatment Plan:  Client Abilities/Strengths Anyssa is intelligent, forthcoming, and motivated for change.   Support System: Family and friends.   Client Treatment Preferences OPT  Client Statement of Needs She discussed her goals for treatment including managing her symptoms, manage her reactions to stress, advocate more for self, and processing past events.   Treatment Level Weekly  Symptoms   Anxiety: History of panic attacks (~1.5 years ago), muscle tension, feeling nervous and on-edge, difficulty managing worry, difficulty relaxing, restlessness, irritability, feeling something awful might happen.   (Status: declined) Depression: feeling down, lethargy, difficulty concentrating, difficulty with sleep (infrequent rumination). (Status: improved)  Goals:  Zelie experiences symptoms of anxiety w/depression.    Target Date: 09/19/2022 Frequency: Weekly  Progress: 5% Modality: individual    Therapist will provide referrals for additional resources as appropriate.  Therapist will provide psycho-education regarding Braylin's diagnosis and corresponding treatment approaches and interventions. Licensed Clinical Social Worker, [place], LCSW will support the patient's ability to achieve the goals identified. will employ CBT, BA, Problem-solving, Solution Focused, Mindfulness,  coping skills, & other evidenced-based practices will be used to promote progress towards healthy functioning to help manage decrease symptoms associated with her diagnosis.   Reduce overall level, frequency, and intensity of the feelings of depression, anxiety and panic evidenced by decreased overall symptoms from 6 to 7 days/week to 0 to 1 days/week per client report for at least 3 consecutive months. Verbally express understanding of the relationship between feelings of depression, anxiety and their impact on thinking patterns and  behaviors. Verbalize an understanding of the role that distorted thinking plays in creating fears,  excessive worry, and ruminations.  [name] participated in the creation of the treatment plan)  [name], LCSW

## 2022-08-31 ENCOUNTER — Ambulatory Visit (INDEPENDENT_AMBULATORY_CARE_PROVIDER_SITE_OTHER): Payer: Medicaid Other | Admitting: Psychology

## 2022-08-31 DIAGNOSIS — F411 Generalized anxiety disorder: Secondary | ICD-10-CM | POA: Diagnosis not present

## 2022-08-31 NOTE — Progress Notes (Signed)
[facility] Counselor/Therapist Progress Note  Patient ID: Christine Rice, MRN: [id number]    Date: 08/31/22  Time Spent: 1:37 pm - 2:29 pm : 52 Minutes  Treatment Type: Individual Therapy.  Reported Symptoms: Anxiety.  Mental Status Exam: Appearance:  Neat     Behavior: Appropriate  Motor: Normal  Speech/Language:  Clear and Coherent  Affect: Congruent  Mood: anxious  Thought process: normal  Thought content:   WNL  Sensory/Perceptual disturbances:   WNL  Orientation: oriented to person, place, time/date, and situation  Attention: Good  Concentration: Good  Memory: WNL  Fund of knowledge:  Good  Insight:   Good  Judgment:  Good  Impulse Control: Good   Risk Assessment: Danger to Self:  No Self-injurious Behavior: No Danger to Others: No Duty to Warn:no Physical Aggression / Violence:No  Access to Firearms a concern: Yes  Gang Involvement:No   Subjective:   Christine Rice participated from home, via video, and consented to treatment. Therapist participated from home office. We met online due to [place] pandemic. Christine Rice reviewed the events of the past week. She noted her attempts to resolve conflict with her friend and noted her friend not taking responsibility for her behavior in the past. She noted an increase in her Cymbalta to 60 mg from 30 mg. She noted feeling naggy towards her children. She noted often reacting poorly to having to give repeated feedback and her children not following through on commitment. We explored this during the session and her attempts to address these concerns.We worked on differentiating between stressors and discussed ways to manage stressors during that time. We began working on identifying ways to  communicate positively and assertively, to build a framework for expectations, to approach frustrations positively, and to manage frustrations should they arise. Therapist praised Christine Rice for her flexibility during the session and  provided supportive therapy.   Interventions: Cognitive Behavioral Therapy and Interpersonal  Diagnosis:  Generalized anxiety disorder  Treatment Plan:  Client Abilities/Strengths Christine Rice is intelligent, forthcoming, and motivated for change.   Support System: Family and friends.   Client Treatment Preferences OPT  Client Statement of Needs She discussed her goals for treatment including managing her symptoms, manage her reactions to stress, advocate more for self, and processing past events.   Treatment Level Weekly  Symptoms   Anxiety: History of panic attacks (~1.5 years ago), muscle tension, feeling nervous and on-edge, difficulty managing worry, difficulty relaxing, restlessness, irritability, feeling something awful might happen.   (Status: declined) Depression: feeling down, lethargy, difficulty concentrating, difficulty with sleep (infrequent rumination). (Status: improved)  Goals:  Javaya experiences symptoms of anxiety w/depression.    Target Date: 09/19/2022 Frequency: Weekly  Progress: 5% Modality: individual    Therapist will provide referrals for additional resources as appropriate.  Therapist will provide psycho-education regarding Christine Rice's diagnosis and corresponding treatment approaches and interventions. Licensed Clinical Social Worker, [place], LCSW will support the patient's ability to achieve the goals identified. will employ CBT, BA, Problem-solving, Solution Focused, Mindfulness,  coping skills, & other evidenced-based practices will be used to promote progress towards healthy functioning to help manage decrease symptoms associated with her diagnosis.   Reduce overall level, frequency, and intensity of the feelings of depression, anxiety and panic evidenced by decreased overall symptoms from 6 to 7 days/week to 0 to 1 days/week per client report for at least 3 consecutive months. Verbally express understanding of the relationship between  feelings of depression, anxiety and their impact on thinking patterns and  behaviors. Verbalize an understanding of the role that distorted thinking plays in creating fears, excessive worry, and ruminations.  Christine Rice participated in the creation of the treatment plan)  [name], LCSW

## 2022-09-06 ENCOUNTER — Ambulatory Visit: Payer: Medicaid Other | Admitting: Psychology

## 2022-09-13 ENCOUNTER — Telehealth: Payer: Self-pay

## 2022-09-13 ENCOUNTER — Ambulatory Visit (INDEPENDENT_AMBULATORY_CARE_PROVIDER_SITE_OTHER): Payer: Medicaid Other | Admitting: Dermatology

## 2022-09-13 DIAGNOSIS — L720 Epidermal cyst: Secondary | ICD-10-CM

## 2022-09-13 DIAGNOSIS — D485 Neoplasm of uncertain behavior of skin: Secondary | ICD-10-CM

## 2022-09-13 MED ORDER — MUPIROCIN 2 % EX OINT: 1.0000 | TOPICAL_OINTMENT | Freq: Every day | CUTANEOUS | 0 refills | Status: DC

## 2022-09-13 NOTE — Progress Notes (Unsigned)
   Follow-Up Visit   Subjective  Christine Rice is a 41 y.o. female who presents for the following: Procedure (Cyst vs other of right lat calf -Excise today).  The following portions of the chart were reviewed this encounter and updated as appropriate:   Tobacco  Allergies  Meds  Problems  Med Hx  Surg Hx  Fam Hx     Review of Systems:  No other skin or systemic complaints except as noted in HPI or Assessment and Plan.  Objective  Well appearing patient in no apparent distress; mood and affect are within normal limits.  A focused examination was performed including right leg. Relevant physical exam findings are noted in the Assessment and Plan.  Right lat calf Cystic pap 3.2 x 2.0 cm    Assessment & Plan  Neoplasm of uncertain behavior of skin - suspected cyst of calf Right lat calf  Skin excision  Lesion length (cm):  3.2 Lesion width (cm):  2 Margin per side (cm):  0 Total excision diameter (cm):  3.2 Informed consent: discussed and consent obtained   Timeout: patient name, date of birth, surgical site, and procedure verified   Procedure prep:  Patient was prepped and draped in usual sterile fashion Prep type:  Isopropyl alcohol and povidone-iodine Anesthesia: the lesion was anesthetized in a standard fashion   Anesthetic:  1% lidocaine w/ epinephrine 1-100,000 buffered w/ 8.4% NaHCO3 Instrument used: #15 blade   Hemostasis achieved with: pressure and electrodesiccation   Outcome: patient tolerated procedure well with no complications   Post-procedure details: sterile dressing applied and wound care instructions given   Dressing type: bandage and pressure dressing (mupirocin)    Skin repair Complexity:  Complex Final length (cm):  3.2 Informed consent: discussed and consent obtained   Timeout: patient name, date of birth, surgical site, and procedure verified   Procedure prep:  Patient was prepped and draped in usual sterile fashion Prep type:   Povidone-iodine (Alcohol) Anesthesia: the lesion was anesthetized in a standard fashion   Anesthetic:  1% lidocaine w/ epinephrine 1-100,000 buffered w/ 8.4% NaHCO3 Reason for type of repair: reduce tension to allow closure, reduce the risk of dehiscence, infection, and necrosis, reduce subcutaneous dead space and avoid a hematoma, allow closure of the large defect, preserve normal anatomy, preserve normal anatomical and functional relationships and enhance both functionality and cosmetic results   Undermining: area extensively undermined   Undermining comment:  Undermining: 2.0 cm Subcutaneous layers (deep stitches):  Suture size:  3-0 Suture type: Vicryl (polyglactin 910)   Subcutaneous suture technique: Inverted dermal. Fine/surface layer approximation (top stitches):  Suture size:  4-0 Suture type: nylon   Stitches: simple running   Stitches comment:  Nylon Suture removal (days):  7 Hemostasis achieved with: suture and pressure Hemostasis achieved with comment:  Electrocautery Outcome: patient tolerated procedure well with no complications   Post-procedure details: sterile dressing applied and wound care instructions given   Dressing type: pressure dressing and bandage (Mupirocin)    mupirocin ointment (BACTROBAN) 2 % Apply 1 Application topically daily. With dressing changes  Specimen 1 - Surgical pathology Differential Diagnosis: Cyst vs other  Check Margins: No  Return in about 1 week (around 09/20/2022) for Surgery.  I, Ashok Cordia, CMA, am acting as scribe for Sarina Ser, MD . Documentation: I have reviewed the above documentation for accuracy and completeness, and I agree with the above.  Sarina Ser, MD

## 2022-09-13 NOTE — Telephone Encounter (Signed)
Spoke with patient regarding surgery. She is doing fine/hd 

## 2022-09-13 NOTE — Patient Instructions (Signed)

## 2022-09-14 ENCOUNTER — Encounter: Payer: Self-pay | Admitting: Dermatology

## 2022-09-19 ENCOUNTER — Ambulatory Visit: Payer: Medicaid Other | Admitting: Dermatology

## 2022-09-19 DIAGNOSIS — L720 Epidermal cyst: Secondary | ICD-10-CM

## 2022-09-19 NOTE — Progress Notes (Signed)
   Follow-Up Visit   Subjective  Christine Rice is a 41 y.o. female who presents for the following: Follow-up (Cyst post op - excised 09/13/22).    The following portions of the chart were reviewed this encounter and updated as appropriate:       Review of Systems:  No other skin or systemic complaints except as noted in HPI or Assessment and Plan.  Objective  Well appearing patient in no apparent distress; mood and affect are within normal limits.  A focused examination was performed including right leg. Relevant physical exam findings are noted in the Assessment and Plan.  Right lat calf Healing excision site    Assessment & Plan  Epidermal inclusion cyst Right lat calf  Encounter for Removal of Sutures - Incision site at the right lat calf is clean, dry and intact - Wound cleansed, sutures removed, wound cleansed and steri strips applied.  - Discussed pathology results showing cyst  - Patient advised to keep steri-strips dry until they fall off. - Scars remodel for a full year. - Once steri-strips fall off, patient can apply over-the-counter silicone scar cream each night to help with scar remodeling if desired. - Patient advised to call with any concerns or if they notice any new or changing lesions.    Return if symptoms worsen or fail to improve.  I, Ashok Cordia, CMA, am acting as scribe for Sarina Ser, MD .

## 2022-09-19 NOTE — Patient Instructions (Signed)
Due to recent changes in healthcare laws, you may see results of your pathology and/or laboratory studies on MyChart before the doctors have had a chance to review them. We understand that in some cases there may be results that are confusing or concerning to you. Please understand that not all results are received at the same time and often the doctors may need to interpret multiple results in order to provide you with the best plan of care or course of treatment. Therefore, we ask that you please give us 2 business days to thoroughly review all your results before contacting the office for clarification. Should we see a critical lab result, you will be contacted sooner.   If You Need Anything After Your Visit  If you have any questions or concerns for your doctor, please call our main line at [phone] and press option 4 to reach your doctor's medical assistant. If no one answers, please leave a voicemail as directed and we will return your call as soon as possible. Messages left after 4 pm will be answered the following business day.   You may also send us a message via MyChart. We typically respond to MyChart messages within 1-2 business days.  For prescription refills, please ask your pharmacy to contact our office. Our fax number is [phone].  If you have an urgent issue when the clinic is closed that cannot wait until the next business day, you can page your doctor at the number below.    Please note that while we do our best to be available for urgent issues outside of office hours, we are not available 24/7.   If you have an urgent issue and are unable to reach us, you may choose to seek medical care at your doctor's office, retail clinic, urgent care center, or emergency room.  If you have a medical emergency, please immediately call 911 or go to the emergency department.  Pager Numbers  - Dr. Kowalski: [phone]  - Dr. Moye: [phone]  - Dr. Stewart:  [phone]  In the event of inclement weather, please call our main line at [phone] for an update on the status of any delays or closures.  Dermatology Medication Tips: Please keep the boxes that topical medications come in in order to help keep track of the instructions about where and how to use these. Pharmacies typically print the medication instructions only on the boxes and not directly on the medication tubes.   If your medication is too expensive, please contact our office at [phone] option 4 or send us a message through MyChart.   We are unable to tell what your co-pay for medications will be in advance as this is different depending on your insurance coverage. However, we may be able to find a substitute medication at lower cost or fill out paperwork to get insurance to cover a needed medication.   If a prior authorization is required to get your medication covered by your insurance company, please allow us 1-2 business days to complete this process.  Drug prices often vary depending on where the prescription is filled and some pharmacies may offer cheaper prices.  The website www.goodrx.com contains coupons for medications through different pharmacies. The prices here do not account for what the cost may be with help from insurance (it may be cheaper with your insurance), but the website can give you the price if you did not use any insurance.  - You can print the associated coupon and take it with   your prescription to the pharmacy.  - You may also stop by our office during regular business hours and pick up a GoodRx coupon card.  - If you need your prescription sent electronically to a different pharmacy, notify our office through [facility] MyChart or by phone at [phone] option 4.     Si Usted Necesita Algo Despus de Su Visita  Tambin puede enviarnos un mensaje a travs de MyChart. Por lo general respondemos a los mensajes de MyChart en el transcurso de 1 a 2  das hbiles.  Para renovar recetas, por favor pida a su farmacia que se ponga en contacto con nuestra oficina. Nuestro nmero de fax es el [phone].  Si tiene un asunto urgente cuando la clnica est cerrada y que no puede esperar hasta el siguiente da hbil, puede llamar/localizar a su doctor(a) al nmero que aparece a continuacin.   Por favor, tenga en cuenta que aunque hacemos todo lo posible para estar disponibles para asuntos urgentes fuera del horario de oficina, no estamos disponibles las 24 horas del da, los 7 das de la semana.   Si tiene un problema urgente y no puede comunicarse con nosotros, puede optar por buscar atencin mdica  en el consultorio de su doctor(a), en una clnica privada, en un centro de atencin urgente o en una sala de emergencias.  Si tiene una emergencia mdica, por favor llame inmediatamente al 911 o vaya a la sala de emergencias.  Nmeros de bper  - Dr. Kowalski: [phone]  - Dra. Moye: [phone]  - Dra. Stewart: [phone]  En caso de inclemencias del tiempo, por favor llame a nuestra lnea principal al [phone] para una actualizacin sobre el estado de cualquier retraso o cierre.  Consejos para la medicacin en dermatologa: Por favor, guarde las cajas en las que vienen los medicamentos de uso tpico para ayudarle a seguir las instrucciones sobre dnde y cmo usarlos. Las farmacias generalmente imprimen las instrucciones del medicamento slo en las cajas y no directamente en los tubos del medicamento.   Si su medicamento es muy caro, por favor, pngase en contacto con nuestra oficina llamando al [phone] y presione la opcin 4 o envenos un mensaje a travs de MyChart.   No podemos decirle cul ser su copago por los medicamentos por adelantado ya que esto es diferente dependiendo de la cobertura de su seguro. Sin embargo, es posible que podamos encontrar un medicamento sustituto a menor costo o llenar un formulario para que el  seguro cubra el medicamento que se considera necesario.   Si se requiere una autorizacin previa para que su compaa de seguros cubra su medicamento, por favor permtanos de 1 a 2 das hbiles para completar este proceso.  Los precios de los medicamentos varan con frecuencia dependiendo del lugar de dnde se surte la receta y alguna farmacias pueden ofrecer precios ms baratos.  El sitio web www.goodrx.com tiene cupones para medicamentos de diferentes farmacias. Los precios aqu no tienen en cuenta lo que podra costar con la ayuda del seguro (puede ser ms barato con su seguro), pero el sitio web puede darle el precio si no utiliz ningn seguro.  - Puede imprimir el cupn correspondiente y llevarlo con su receta a la farmacia.  - Tambin puede pasar por nuestra oficina durante el horario de atencin regular y recoger una tarjeta de cupones de GoodRx.  - Si necesita que su receta se enve electrnicamente a una farmacia diferente, informe a nuestra oficina a travs de MyChart de [facility]   o por telfono llamando al [phone] y presione la opcin 4.  

## 2022-09-20 ENCOUNTER — Ambulatory Visit
Admission: RE | Admit: 2022-09-20 | Discharge: 2022-09-20 | Disposition: A | Discharge status: Home / Self Care | Payer: Medicaid Other | Source: Ambulatory Visit | Attending: Obstetrics and Gynecology | Admitting: Obstetrics and Gynecology

## 2022-09-20 DIAGNOSIS — R922 Inconclusive mammogram: Secondary | ICD-10-CM | POA: Diagnosis not present

## 2022-09-20 DIAGNOSIS — N6489 Other specified disorders of breast: Secondary | ICD-10-CM | POA: Diagnosis present

## 2022-09-22 ENCOUNTER — Other Ambulatory Visit: Payer: Self-pay | Admitting: Obstetrics and Gynecology

## 2022-09-22 DIAGNOSIS — R928 Other abnormal and inconclusive findings on diagnostic imaging of breast: Secondary | ICD-10-CM

## 2022-09-27 ENCOUNTER — Encounter: Payer: Medicaid Other | Admitting: Psychology

## 2022-09-27 NOTE — Progress Notes (Signed)
This encounter was created in error - please disregard.

## 2022-10-11 ENCOUNTER — Ambulatory Visit: Payer: Medicaid Other | Admitting: Psychology

## 2022-10-18 ENCOUNTER — Telehealth: Payer: Self-pay

## 2022-10-18 DIAGNOSIS — Z87898 Personal history of other specified conditions: Secondary | ICD-10-CM

## 2022-10-18 NOTE — Telephone Encounter (Signed)
Order placed. Please let patient know.

## 2022-10-18 NOTE — Telephone Encounter (Signed)
Pt calling; [facility] [phone]

## 2022-10-19 NOTE — Telephone Encounter (Signed)
Pt aware.

## 2022-11-07 ENCOUNTER — Ambulatory Visit (INDEPENDENT_AMBULATORY_CARE_PROVIDER_SITE_OTHER): Payer: Medicaid Other | Admitting: Psychology

## 2022-11-07 DIAGNOSIS — F411 Generalized anxiety disorder: Secondary | ICD-10-CM | POA: Diagnosis not present

## 2022-11-07 NOTE — Progress Notes (Signed)
[facility] Counselor/Therapist Progress Note  Patient ID: Christine Rice, MRN: [id number]    Date: 11/07/22  Time Spent: 2:34 pm - 3:30 pm : 33 Minutes  Treatment Type: Individual Therapy.  Reported Symptoms: Anxiety.  Mental Status Exam: Appearance:  Neat     Behavior: Appropriate  Motor: Normal  Speech/Language:  Clear and Coherent  Affect: Congruent  Mood: anxious  Thought process: normal  Thought content:   WNL  Sensory/Perceptual disturbances:   WNL  Orientation: oriented to person, place, time/date, and situation  Attention: Good  Concentration: Good  Memory: WNL  Fund of knowledge:  Good  Insight:   Good  Judgment:  Good  Impulse Control: Good   Risk Assessment: Danger to Self:  No Self-injurious Behavior: No Danger to Others: No Duty to Warn:no Physical Aggression / Violence:No  Access to Firearms a concern: Yes  Gang Involvement:No   Subjective:   ADALAI PERL participated from home, via video, and consented to treatment. Therapist participated from home office. We met online due to [place] pandemic. Shermika reviewed the events of the past week. She noted "I always seem to find my way into it (drama)". She noted this in relation to her daughter's teacher who is now a Loss adjuster, chartered. Maleiyah noted receiving negative feedback from the same teacher regarding simple mistakes. She discussed her attempts to resolve this issue, to no avail. She noted her attempts to resolve this via the counselor, as well. She noted difficulty receiving consistent negative feedback. We explored this during the session and ways to delineate varying levels of stressors. We discussed ways to be assertive and direct communization. She noted worry this situation might escalate.  Therapist highlighted cognitive distortions, jumping to conclusions, and worked on identifying any positive evidence for this.  Therapist discussed the importance of being mindful of these  distortions and to challenge them via evidence and past experience.  Additionally therapist encouraged Avenly to adopt a more positive thought process or neutral process, going forward, due to lack of evidence.  We discussed comfort [place] and assertiveness.  Therapist modeled this during the session.  Shuntel discussed her rising anxiety and her fear that she might turn into his super protective mother during these conversations.  We began to delineate what this means and the positives and negatives in relation to the situation.  Bobbi was engaged and motivated during the session and expressed commitment towards her goals.  Therapist praised Tykera for her effort and energy and provided supportive therapy.  Sulema is due for her annual reevaluation and this will be completed during our follow-up.  Therapist provided supportive therapy.   Interventions: Cognitive Behavioral Therapy and Interpersonal  Diagnosis:  Generalized anxiety disorder  Treatment Plan:  Client Abilities/Strengths Kylar is intelligent, forthcoming, and motivated for change.   Support System: Family and friends.   Client Treatment Preferences OPT  Client Statement of Needs She discussed her goals for treatment including managing her symptoms, manage her reactions to stress, advocate more for self, and processing past events.   Treatment Level Weekly  Symptoms   Anxiety: History of panic attacks (~1.5 years ago), muscle tension, feeling nervous and on-edge, difficulty managing worry, difficulty relaxing, restlessness, irritability, feeling something awful might happen.   (Status: maintained) Depression: feeling down, lethargy, difficulty concentrating, difficulty with sleep (infrequent rumination). (Status: maintained)  Goals:  Elonna experiences symptoms of anxiety w/depression.    Target Date: 11/19/2022 Frequency: Weekly  Progress: 10% Modality: individual    Therapist will  provide referrals  for additional resources as appropriate.  Therapist will provide psycho-education regarding Krystle's diagnosis and corresponding treatment approaches and interventions. Licensed Clinical Social Worker, [place], LCSW will support the patient's ability to achieve the goals identified. will employ CBT, BA, Problem-solving, Solution Focused, Mindfulness,  coping skills, & other evidenced-based practices will be used to promote progress towards healthy functioning to help manage decrease symptoms associated with her diagnosis.   Reduce overall level, frequency, and intensity of the feelings of depression, anxiety and panic evidenced by decreased overall symptoms from 6 to 7 days/week to 0 to 1 days/week per client report for at least 3 consecutive months. Verbally express understanding of the relationship between feelings of depression, anxiety and their impact on thinking patterns and behaviors. Verbalize an understanding of the role that distorted thinking plays in creating fears, excessive worry, and ruminations.  [name] participated in the creation of the treatment plan)  [name], LCSW

## 2022-11-08 ENCOUNTER — Other Ambulatory Visit: Payer: Self-pay | Admitting: Family Medicine

## 2022-11-09 ENCOUNTER — Ambulatory Visit
Admission: EM | Admit: 2022-11-09 | Discharge: 2022-11-09 | Disposition: A | Discharge status: Home / Self Care | Payer: Medicaid Other | Attending: Urgent Care | Admitting: Urgent Care

## 2022-11-09 DIAGNOSIS — J029 Acute pharyngitis, unspecified: Secondary | ICD-10-CM | POA: Diagnosis not present

## 2022-11-09 NOTE — ED Provider Notes (Addendum)
Roderic Palau    CSN: [id number] Arrival date & time: 11/09/22  0932      History   Chief Complaint Chief Complaint  Patient presents with   Sore Throat    HPI Christine Rice is a 41 y.o. female.    Sore Throat    Presents with complaint of sore throat x 5 days.  She endorses positive exposure for strep.  Has body aches, no chills, no documented fever.  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   Chronic headaches 01/10/2020   Skin lesion 06/10/2019   H/O hernia repair 06/10/2019   History of smoking 10-25 pack years 07/14/2014   Routine general medical examination at a health care facility 07/07/2014   Cesarean delivery delivered 01/19/2012   IBS 08/15/2007   History of HPV infection 08/14/2007   Anxiety disorder 08/14/2007   DEPRESSION 08/14/2007   ADD 08/14/2007    Past Surgical History:  Procedure Laterality Date   CESAREAN SECTION  01/06/2005   CESAREAN SECTION  01/19/2012   Procedure: CESAREAN SECTION;  Surgeon: Luz Lex, MD;  Location: [facility];  Service: Gynecology;  Laterality: N/A;  repeat   HERNIA REPAIR     INGUINAL HERNIA REPAIR  01/19/2012   Procedure: HERNIA REPAIR INGUINAL ADULT BILATERAL;  Surgeon: Adin Hector, MD;  Location: [facility];  Service: General;  Laterality: Bilateral;   RENAL BIOPSY, PERCUTANEOUS  01/06/2020       tummy tuck     UMBILICAL HERNIA REPAIR  01/19/2012   Procedure: HERNIA REPAIR UMBILICAL ADULT;  Surgeon: Adin Hector, MD;  Location: [facility];  Service: General;  Laterality: N/A;   UMBILICAL HERNIA REPAIR  11/19/2014    WISDOM TOOTH EXTRACTION      OB History     Gravida  3   Para  2   Term  2   Preterm      AB  1   Living  2      SAB      IAB  1   Ectopic      Multiple      Live Births  2            Home Medications    Prior to Admission medications   Medication Sig Start Date End Date Taking? Authorizing Provider  ALPRAZolam (XANAX) 0.25 MG tablet TAKE 1 TABLET BY MOUTH DAILY AS NEEDED FOR ANXIETY 07/11/22   Tower, Marne A, MD  ASPIRIN 81 PO Take 81 mg by mouth daily.    [provider]  DULoxetine (CYMBALTA) 60 MG capsule Take 1 capsule (60 mg total) by mouth daily. 08/19/22   Tower, Wynelle Fanny, MD  fluticasone (FLONASE) 50 MCG/ACT nasal spray Place 2 sprays into both nostrils daily. 10/15/21   Tower, Wynelle Fanny, MD  Glucosamine 750 MG TABS Take 1 tablet by mouth daily.    [provider]  hydrOXYzine (VISTARIL) 25 MG capsule TAKE 1 CAPSULE BY MOUTH EVERY 8 HOURS ASNEEDED 12/01/21   Tower, Wynelle Fanny, MD  levonorgestrel (MIRENA) 20 MCG/24HR IUD 1  each by Intrauterine route once.    [provider]  losartan (COZAAR) 50 MG tablet TAKE ONE TABLET BY MOUTH EVERY DAY 10/28/20 02/09/24  Kolluru, Lurena Nida, MD  Multiple Vitamin (MULTIVITAMIN) tablet Take 1 tablet by mouth daily. 01/07/20   Jennye Boroughs, MD  mupirocin ointment (BACTROBAN) 2 % Apply 1 Application topically daily. With dressing changes 09/13/22   Ralene Bathe, MD    Family History Family History  Problem Relation Age of Onset   Rashes / Skin problems Mother    COPD Father    Stroke Father    Healthy Daughter    Healthy Daughter    Breast cancer Neg Hx    Cancer Neg Hx     Social History Social History   Tobacco Use   Smoking status: Former    Types: Cigarettes    Quit date: 11/15/2019    Years since quitting: 2.9   Smokeless tobacco: Never  Vaping Use   Vaping Use: Former  Substance Use Topics   Alcohol use: Not Currently   Drug use: Yes    Types: Marijuana    Comment: occ-  marijuana      Allergies   Amoxicillin, Penicillins, and Lipitor [atorvastatin]   Review of Systems Review of Systems   Physical Exam Triage Vital Signs ED Triage Vitals [11/09/22 1103]  Enc Vitals Group     BP      Pulse      Resp      Temp      Temp src      SpO2      Weight      Height      Head Circumference      Peak Flow      Pain Score 0     Pain Loc      Pain Edu?      Excl. in [place]?    No data found.  Updated Vital Signs There were no vitals taken for this visit.  Visual Acuity Right Eye Distance:   Left Eye Distance:   Bilateral Distance:    Right Eye Near:   Left Eye Near:    Bilateral Near:     Physical Exam Vitals reviewed.  Constitutional:      Appearance: She is well-developed.  HENT:     Mouth/Throat:     Mouth: Mucous membranes are moist.     Pharynx: Posterior oropharyngeal erythema present. No oropharyngeal exudate.     Tonsils: No tonsillar exudate.  Skin:    General: Skin is warm and dry.  Neurological:     General: No focal deficit present.     Mental Status: She is alert and oriented to person, place, and time.  Psychiatric:        Mood and Affect: Mood normal.        Behavior: Behavior normal.      UC Treatments / Results  Labs (all labs ordered are listed, but only abnormal results are displayed) Labs Reviewed  POCT RAPID STREP A (OFFICE)    EKG   Radiology No results found.  Procedures Procedures (including critical care time)  Medications Ordered in UC Medications - No data to display  Initial Impression / Assessment and Plan / UC Course  I have reviewed the triage vital signs and the nursing notes.  Pertinent labs & imaging results that were available during my care of the patient were reviewed by me and considered in my medical decision making (see chart for details).  Patient is afebrile here without recent antipyretics. Satting well on room air. Overall is well appearing, well hydrated, without  respiratory distress.  Mild pharyngeal erythema without peritonsillar exudates.  Rapid strep is negative.  Presume viral etiology for her symptoms and recommending OTC medication for symptom control as needed.    Final Clinical Impressions(s) / UC Diagnoses   Final diagnoses:  None   Discharge Instructions   None    ED Prescriptions   None    PDMP not reviewed this encounter.   Rose Phi, [place] 11/09/22 1132    LangelothAnnie Main, [place] 11/09/22 1134

## 2022-11-09 NOTE — Discharge Instructions (Signed)
Use over-the-counter medication for control of your symptoms.  Follow up here or with your primary care provider if your symptoms are worsening or not improving.

## 2022-11-09 NOTE — ED Triage Notes (Signed)
Pt. States she has had a sore throat for the past 5 days. Daughter tested positive for strep recently.

## 2022-11-10 ENCOUNTER — Encounter: Payer: Self-pay | Admitting: Family Medicine

## 2022-11-10 ENCOUNTER — Ambulatory Visit (INDEPENDENT_AMBULATORY_CARE_PROVIDER_SITE_OTHER): Payer: Medicaid Other | Admitting: Family Medicine

## 2022-11-10 ENCOUNTER — Ambulatory Visit: Payer: Medicaid Other | Admitting: Family Medicine

## 2022-11-10 VITALS — BP 90/60 | HR 76 | Temp 98.4°F | Ht 65.75 in | Wt 179.1 lb

## 2022-11-10 DIAGNOSIS — R197 Diarrhea, unspecified: Secondary | ICD-10-CM

## 2022-11-10 DIAGNOSIS — K529 Noninfective gastroenteritis and colitis, unspecified: Secondary | ICD-10-CM | POA: Diagnosis not present

## 2022-11-10 NOTE — Patient Instructions (Signed)
Gyne-lotrimin or similar like clotrimazole   Immodium A-D over the counter for diarrhea

## 2022-11-10 NOTE — Telephone Encounter (Signed)
Name of Medication: Xanax Name of Pharmacy: [facility] or Written Date and Quantity: 07/11/22 #30 tab/ 0 refills  Last Office Visit and Type: 07/08/22 f/u Next Office Visit and Type: acute with Dr. Lorelei Pont Today

## 2022-11-10 NOTE — Progress Notes (Signed)
[name] [name], MD, [place] [facility] [place], [address]  Phone: [phone]  FAX: [phone]  AMESHIA Rice - 41 y.o. female  MRN [id number]  Date of Birth: 1981/07/06  Date: 11/10/2022  PCP: Abner Greenspan, MD  Referral: Abner Greenspan, MD  Chief Complaint  Patient presents with   Diarrhea    Started on Wednesday Morning   Vaginal Odor   Subjective:   Christine Rice is a 41 y.o. very pleasant female patient with Body mass index is 29.13 kg/m. who presents with the following:  Pleasant patient presents with subacute onset of diarrhea, and she also has some vaginal odor/irritation.   Having a lot of diarrhea.   Always has some loose stools - water and all day.  Ongoing since wed morning.  This has been fairly profuse and more than normal.  Very watery.   No blood or mucous.    Going 5 or six times in 24 hours.   No STDs - many years monogamous.  No discharge.  External irritation.   Review of Systems is noted in the HPI, as appropriate  Objective:   BP 90/60   Pulse 76   Temp 98.4 F (36.9 C) (Oral)   Ht 5' 5.75" (1.67 m)   Wt 179 lb 2 oz (81.3 kg)   SpO2 98%   BMI 29.13 kg/m   GEN: No acute distress; alert,appropriate. PULM: Breathing comfortably in no respiratory distress PSYCH: Normally interactive.  Abdominal exam shows hyperactive bowel sounds.  There is no tenderness.  There is no distention.  Soft. No HSM.   Laboratory and Imaging Data:  Assessment and Plan:     ICD-10-CM   1. Gastroenteritis  K52.9     2. Acute diarrhea  R19.7      Viral gastroenteritis, we discussed as such.  She is going to continue to hydrate well, and she can use some Imodium as needed at home.    She is also going to use some Gyne-Lotrimin externally, just to be on the safe side for possible external yeast infection.  Medication Management during today's office visit: No orders of the  defined types were placed in this encounter.  Medications Discontinued During This Encounter  Medication Reason   losartan (COZAAR) 50 MG tablet Completed Course   Glucosamine 750 MG TABS Completed Course   fluticasone (FLONASE) 50 MCG/ACT nasal spray Completed Course   mupirocin ointment (BACTROBAN) 2 % Completed Course    Orders placed today for conditions managed today: No orders of the defined types were placed in this encounter.   Disposition: No follow-ups on file.  [employer] One speech-to-text software was used for transcription in this dictation.  Possible transcriptional errors can occur using Editor, commissioning.   Signed,  Maud Deed. Ascher Schroepfer, MD   Outpatient Encounter Medications as of 11/10/2022  Medication Sig   ALPRAZolam (XANAX) 0.25 MG tablet TAKE 1 TABLET BY MOUTH DAILY AS NEEDED FOR ANXIETY   ASPIRIN 81 PO Take 81 mg by mouth daily.   DULoxetine (CYMBALTA) 60 MG capsule Take 1 capsule (60 mg total) by mouth daily.   hydrOXYzine (VISTARIL) 25 MG capsule TAKE 1 CAPSULE BY MOUTH EVERY 8 HOURS ASNEEDED   levonorgestrel (MIRENA) 20 MCG/24HR IUD 1 each by Intrauterine route once.   losartan (COZAAR) 25 MG tablet Take 25 mg by mouth daily.   Multiple Vitamin (MULTIVITAMIN) tablet Take 1 tablet by mouth daily.   [  DISCONTINUED] fluticasone (FLONASE) 50 MCG/ACT nasal spray Place 2 sprays into both nostrils daily.   [DISCONTINUED] Glucosamine 750 MG TABS Take 1 tablet by mouth daily.   [DISCONTINUED] losartan (COZAAR) 50 MG tablet TAKE ONE TABLET BY MOUTH EVERY DAY   [DISCONTINUED] mupirocin ointment (BACTROBAN) 2 % Apply 1 Application topically daily. With dressing changes   No facility-administered encounter medications on file as of 11/10/2022.

## 2022-11-24 ENCOUNTER — Encounter: Payer: Self-pay | Admitting: Family Medicine

## 2022-11-24 DIAGNOSIS — R197 Diarrhea, unspecified: Secondary | ICD-10-CM

## 2022-11-27 ENCOUNTER — Telehealth: Payer: Self-pay | Admitting: Family Medicine

## 2022-11-27 DIAGNOSIS — R197 Diarrhea, unspecified: Secondary | ICD-10-CM | POA: Insufficient documentation

## 2022-11-27 NOTE — Telephone Encounter (Signed)
The labs are for stool tests I think Dr Lorelei Pont put them in already

## 2022-11-27 NOTE — Telephone Encounter (Signed)
-----   Message from Velna Hatchet, RT sent at 11/25/2022  4:30 PM EST ----- Regarding: Mon 12/18 lab Lab orders needed for labs only appt on 12/18, please.  Thanks, Anda Kraft

## 2022-11-28 ENCOUNTER — Other Ambulatory Visit: Payer: Medicaid Other

## 2022-11-28 ENCOUNTER — Other Ambulatory Visit: Payer: Self-pay

## 2022-11-28 DIAGNOSIS — R197 Diarrhea, unspecified: Secondary | ICD-10-CM

## 2022-11-29 DIAGNOSIS — R197 Diarrhea, unspecified: Secondary | ICD-10-CM | POA: Diagnosis not present

## 2022-11-30 LAB — OVA AND PARASITE EXAMINATION
CONCENTRATE RESULT:: NONE SEEN
MICRO NUMBER:: 14328562
SPECIMEN QUALITY:: ADEQUATE
TRICHROME RESULT:: NONE SEEN

## 2022-12-01 ENCOUNTER — Ambulatory Visit: Payer: Medicaid Other | Admitting: Psychology

## 2022-12-03 LAB — STOOL CULTURE: E coli, Shiga toxin Assay: NEGATIVE

## 2022-12-09 ENCOUNTER — Telehealth: Payer: Self-pay | Admitting: Family Medicine

## 2022-12-09 MED ORDER — DULOXETINE HCL 60 MG PO CPEP: 60.0000 mg | ORAL_CAPSULE | Freq: Every day | ORAL | 2 refills | Status: DC

## 2022-12-09 NOTE — Telephone Encounter (Signed)
Patient called in and stated that she takes DULoxetine (CYMBALTA) 60 MG capsule. She was wanting to know if she can get a 90 day supply instead of 30 day. She has 2 more lefts and will like to pick up them up today. She uses the [facility], [place]. Please advise. Thank you!

## 2022-12-09 NOTE — Telephone Encounter (Signed)
See prev note, last f/u was 07/08/22, last filled on 08/19/22 #30 caps w/ 11 refills, please advise

## 2022-12-23 ENCOUNTER — Other Ambulatory Visit: Payer: Self-pay | Admitting: Family Medicine

## 2022-12-23 NOTE — Telephone Encounter (Signed)
Last filled on 12/01/21 #90 caps/ 3 refills, pt had a recent acute appt with another provider. Last f/u was on 07/08/22

## 2022-12-26 ENCOUNTER — Ambulatory Visit (INDEPENDENT_AMBULATORY_CARE_PROVIDER_SITE_OTHER): Payer: Medicaid Other | Admitting: Psychology

## 2022-12-26 DIAGNOSIS — F411 Generalized anxiety disorder: Secondary | ICD-10-CM | POA: Diagnosis not present
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
## 2022-12-28 ENCOUNTER — Encounter: Payer: Self-pay | Admitting: Family Medicine

## 2023-01-19 ENCOUNTER — Ambulatory Visit (INDEPENDENT_AMBULATORY_CARE_PROVIDER_SITE_OTHER): Payer: Medicaid Other | Admitting: Psychology

## 2023-01-19 DIAGNOSIS — F411 Generalized anxiety disorder: Secondary | ICD-10-CM | POA: Diagnosis not present

## 2023-01-19 NOTE — Progress Notes (Signed)
[facility] Counselor/Therapist Progress Note  Patient ID: Christine Rice, MRN: [id number]   Date: 01/19/23  Time Spent: 3:07  pm - 4:00 pm : 53 Minutes  Treatment Type: Individual Therapy.  Reported Symptoms: depression and anxiety.   Mental Status Exam: Appearance:  NA     Behavior: Appropriate  Motor: na  Speech/Language:  Normal Rate  Affect: Appropriate  Mood: normal  Thought process: normal  Thought content:   WNL  Sensory/Perceptual disturbances:   WNL  Orientation: oriented to person, place, time/date, and situation  Attention: Good  Concentration: Good  Memory: WNL  Fund of knowledge:  Good  Insight:   Good  Judgment:  Good  Impulse Control: Good   Risk Assessment: Danger to Self:  No Self-injurious Behavior: No Danger to Others: No Duty to Warn:no Physical Aggression / Violence:No  Access to Firearms a concern: No  Gang Involvement:No   Subjective:   Christine Rice participated from car, via phone and consented to treatment. Therapist participated from home office. We met online due to [place] pandemic. Christine Rice reviewed the events of the past week. We reviewed numerous treatment approaches including CBT, BA, Problem Solving, and Solution focused therapy. Psych-education regarding the [name] diagnosis of Generalized anxiety disorder was provided during the session. We discussed Christine Rice goals treatment goals which include process parenting stressors, reduce negative self-talk,  self-care, managing mood proactively, increasing frustration tolerance, managing frustration, and engaging socially. Christine Rice provided verbal approval of the treatment plan. We discussed her concerns regarding her daughter and the person she is interested in. She noted a need to set boundaries but noted her daughter now being 60 and the difficulty with this transition. We explored this during the session.   Interventions: Psycho-education & Goal Setting.    Diagnosis:  Generalized anxiety disorder  Psychiatric Treatment: Yes , via PCP. See chart.    Treatment Plan:  Client Abilities/Strengths Christine Rice is forthcoming, self-aware, and motivated for change.   Support System: Boyfriend, friends.   Client Treatment Preferences Outpatient Therapy.   Client Statement of Needs Christine Rice would like to process parenting stressors, reduce negative self-talk,  self-care, managing mood proactively, increasing frustration tolerance, managing frustration, and engaging socially.   Treatment Level Weekly  Symptoms  Anxiety: anxious, irritability, difficulty managing worry, low frustration tolerance, difficulty leaving the home, social anxiety.    (Status: maintained) Depression: poor sleep, lethargy.    (Status: maintained)  Goals:   Christine Rice experiences symptoms of depression and anxiety.    Target Date: 01/20/24 Frequency: Weekly  Progress: 0 Modality: individual    Therapist will provide referrals for additional resources as appropriate.  Therapist will provide psycho-education regarding Christine Rice's diagnosis and corresponding treatment approaches and interventions. Licensed Clinical Social Worker, [place], LCSW will support the patient's ability to achieve the goals identified. will employ CBT, BA, Problem-solving, Solution Focused, Mindfulness,  coping skills, & other evidenced-based practices will be used to promote progress towards healthy functioning to help manage decrease symptoms associated with her diagnosis.   Reduce overall level, frequency, and intensity of the feelings of depression and anxiety as evidenced by decreased  from 6 to 7 days/week to 0 to 1 days/week per client report for at least 3 consecutive months. Verbally express understanding of the relationship between feelings of depression, anxiety and their impact on thinking patterns and behaviors. Verbalize an understanding of the role that distorted thinking plays in  creating fears, excessive worry, and ruminations.    Christine Rice participated  in the creation of the treatment plan)    [name], LCSW

## 2023-01-31 ENCOUNTER — Ambulatory Visit (INDEPENDENT_AMBULATORY_CARE_PROVIDER_SITE_OTHER): Payer: Medicaid Other | Admitting: Psychology

## 2023-01-31 DIAGNOSIS — F411 Generalized anxiety disorder: Secondary | ICD-10-CM

## 2023-01-31 NOTE — Progress Notes (Signed)
[facility] Counselor/Therapist Progress Note  Patient ID: Christine Rice, MRN: [id number]   Date: 01/31/23  Time Spent: 4:03 pm - 4:55 pm : 52 Minutes  Treatment Type: Individual Therapy.  Reported Symptoms: depression and anxiety.   Mental Status Exam: Appearance:  Well Groomed     Behavior: Appropriate  Motor: Normal  Speech/Language:  Normal Rate  Affect: Appropriate  Mood: normal  Thought process: normal  Thought content:   WNL  Sensory/Perceptual disturbances:   WNL  Orientation: oriented to person, place, time/date, and situation  Attention: Good  Concentration: Good  Memory: WNL  Fund of knowledge:  Good  Insight:   Good  Judgment:  Good  Impulse Control: Good   Risk Assessment: Danger to Self:  No Self-injurious Behavior: No Danger to Others: No Duty to Warn:no Physical Aggression / Violence:No  Access to Firearms a concern: No  Gang Involvement:No   Subjective:   Christine Rice participated from home, via video and consented to treatment. Therapist participated from home office. We met online due to [place] pandemic. Christine Rice reviewed the events of the past week. She noted the adjustment she is undergoing in regards to parenting an 42 year old and noted difficulties in that regard.  We worked on explored this during the session and worked on identifying positives during this transition. She noted worry about her youngest daughter, Christine Rice, who is possibly ADHD and would benefit from an assessment. Therapist discussed resources and benefits of assessment and treatment. Therapist provided reading material regarding ADHD and parenting fore reference and review. Therapist provided psycho-education regarding ADHD. We processed Christine Rice's frustration regarding her daughter's teacher's behavior. We worked on re-framing this and identifying areas of control and lack of control. Therapist validated Christine Rice's feelings and experience and provided supportive therapy.    Interventions: CBT and interpersonal.   Diagnosis:   Generalized anxiety disorder  Psychiatric Treatment: Yes , via PCP. See chart.    Treatment Plan:  Client Abilities/Strengths Christine Rice is forthcoming, self-aware, and motivated for change.   Support System: Boyfriend, friends.   Client Treatment Preferences Outpatient Therapy.   Client Statement of Needs Christine Rice would like to process parenting stressors, reduce negative self-talk,  self-care, managing mood proactively, increasing frustration tolerance, managing frustration, and engaging socially.   Treatment Level Weekly  Symptoms  Anxiety: anxious, irritability, difficulty managing worry, low frustration tolerance, difficulty leaving the home, social anxiety.    (Status: maintained) Depression: poor sleep, lethargy.    (Status: maintained)  Goals:   Christine Rice experiences symptoms of depression and anxiety.    Target Date: 01/20/24 Frequency: Weekly  Progress: 0 Modality: individual    Therapist will provide referrals for additional resources as appropriate.  Therapist will provide psycho-education regarding Christine Rice's diagnosis and corresponding treatment approaches and interventions. Licensed Clinical Social Worker, [place], LCSW will support the patient's ability to achieve the goals identified. will employ CBT, BA, Problem-solving, Solution Focused, Mindfulness,  coping skills, & other evidenced-based practices will be used to promote progress towards healthy functioning to help manage decrease symptoms associated with her diagnosis.   Reduce overall level, frequency, and intensity of the feelings of depression and anxiety as evidenced by decreased  from 6 to 7 days/week to 0 to 1 days/week per client report for at least 3 consecutive months. Verbally express understanding of the relationship between feelings of depression, anxiety and their impact on thinking patterns and behaviors. Verbalize an understanding  of the role that distorted thinking plays in creating fears, excessive worry,  and ruminations.    Christine Rice participated in the creation of the treatment plan)    [name], LCSW

## 2023-02-01 ENCOUNTER — Ambulatory Visit: Payer: Medicaid Other | Admitting: Psychology

## 2023-02-14 ENCOUNTER — Ambulatory Visit (INDEPENDENT_AMBULATORY_CARE_PROVIDER_SITE_OTHER): Payer: Medicaid Other | Admitting: Psychology

## 2023-02-14 DIAGNOSIS — F411 Generalized anxiety disorder: Secondary | ICD-10-CM

## 2023-02-14 NOTE — Progress Notes (Signed)
[place] Counselor/Therapist Progress Note  Patient ID: Christine Rice, MRN: [id number]   Date: 02/14/23  Time Spent: 9:08 am - 9:56 am : 48 Minutes  Treatment Type: Individual Therapy.  Reported Symptoms: depression and anxiety.   Mental Status Exam: Appearance:  Well Groomed     Behavior: Appropriate  Motor: Normal  Speech/Language:  Normal Rate  Affect: Appropriate  Mood: normal  Thought process: normal  Thought content:   WNL  Sensory/Perceptual disturbances:   WNL  Orientation: oriented to person, place, time/date, and situation  Attention: Good  Concentration: Good  Memory: WNL  Fund of knowledge:  Good  Insight:   Good  Judgment:  Good  Impulse Control: Good   Risk Assessment: Danger to Self:  No Self-injurious Behavior: No Danger to Others: No Duty to Warn:no Physical Aggression / Violence:No  Access to Firearms a concern: No  Gang Involvement:No   Subjective:   Christine Rice participated from home, via video and consented to treatment. Therapist participated from home office. We met online due to [place] pandemic. Christine Rice reviewed the events of the past week. Christine Rice noted her effort to develop a working document for household rules. She noted her continued efforts to adjust her parenting style. She noted a need to "let the little things go" and having difficulty with empathy, at times. She noted taking other people's behavior as personal. She noted her perception often dictating her response, which often exudes frustration. We worked on Designer, industrial/product, attempting to look  situations through outside perspective and employ empathy. She noted difficulty managing her distress. We worked on processing this during the session and her difficulty in this area. Therapist introduced distress tolerance and provided a handout, via email, for reference and review. We will work in this area going forward. Christine Rice was engaged and motivated during the  session. Therapist validated and normalized Christine Rice's feelings and experience and provided supportive therapy. A follow-up was scheduled for continued treatment.   Interventions: CBT,  interpersonal, DBT skills.   Diagnosis:   Generalized anxiety disorder  Psychiatric Treatment: Yes , via PCP. See chart.    Treatment Plan:  Client Abilities/Strengths Christine Rice is forthcoming, self-aware, and motivated for change.   Support System: Boyfriend, friends.   Client Treatment Preferences Outpatient Therapy.   Client Statement of Needs Christine Rice would like to process parenting stressors, reduce negative self-talk,  self-care, managing mood proactively, increasing frustration tolerance, managing frustration, and engaging socially.   Treatment Level Weekly  Symptoms  Anxiety: anxious, irritability, difficulty managing worry, low frustration tolerance, difficulty leaving the home, social anxiety.    (Status: maintained) Depression: poor sleep, lethargy.    (Status: maintained)  Goals:   Christine Rice experiences symptoms of depression and anxiety.    Target Date: 01/20/24 Frequency: Weekly  Progress: 0 Modality: individual    Therapist will provide referrals for additional resources as appropriate.  Therapist will provide psycho-education regarding Christine Rice's diagnosis and corresponding treatment approaches and interventions. Licensed Clinical Social Worker, [place], LCSW will support the patient's ability to achieve the goals identified. will employ CBT, BA, Problem-solving, Solution Focused, Mindfulness,  coping skills, & other evidenced-based practices will be used to promote progress towards healthy functioning to help manage decrease symptoms associated with her diagnosis.   Reduce overall level, frequency, and intensity of the feelings of depression and anxiety as evidenced by decreased  from 6 to 7 days/week to 0 to 1 days/week per client report for at least 3 consecutive  months. Verbally express understanding  of the relationship between feelings of depression, anxiety and their impact on thinking patterns and behaviors. Verbalize an understanding of the role that distorted thinking plays in creating fears, excessive worry, and ruminations.    Christine Rice participated in the creation of the treatment plan)    Christine Irish, LCSW

## 2023-02-27 DIAGNOSIS — R809 Proteinuria, unspecified: Secondary | ICD-10-CM | POA: Diagnosis not present

## 2023-02-27 DIAGNOSIS — N050 Unspecified nephritic syndrome with minor glomerular abnormality: Secondary | ICD-10-CM | POA: Diagnosis not present

## 2023-03-02 ENCOUNTER — Ambulatory Visit (INDEPENDENT_AMBULATORY_CARE_PROVIDER_SITE_OTHER): Payer: Medicaid Other | Admitting: Psychology

## 2023-03-02 DIAGNOSIS — F411 Generalized anxiety disorder: Secondary | ICD-10-CM | POA: Diagnosis not present

## 2023-03-02 NOTE — Progress Notes (Signed)
[facility] Counselor/Therapist Progress Note  Patient ID: KAILEAH SINKLER, MRN: [id number]   Date: 03/02/23  Time Spent: 1:06 pm - 1:31 pm :25 Minutes  Treatment Type: Individual Therapy.  Reported Symptoms: depression and anxiety.   Mental Status Exam: Appearance:  Well Groomed     Behavior: Appropriate  Motor: Normal  Speech/Language:  Normal Rate  Affect: Appropriate  Mood: normal  Thought process: normal  Thought content:   WNL  Sensory/Perceptual disturbances:   WNL  Orientation: oriented to person, place, time/date, and situation  Attention: Good  Concentration: Good  Memory: WNL  Fund of knowledge:  Good  Insight:   Good  Judgment:  Good  Impulse Control: Good   Risk Assessment: Danger to Self:  No Self-injurious Behavior: No Danger to Others: No Duty to Warn:no Physical Aggression / Violence:No  Access to Firearms a concern: No  Gang Involvement:No   Subjective:   SERIYA HARKNESS participated from home, via video and consented to treatment. Therapist participated from home office. We met online due to [place] pandemic. Alencia reviewed the events of the past week. She noted an ex-boyfriend reaching out, unexpectedly, to flirt with her and the realization from [place] that he was engaged. She noted frustration that her mother lied to her about this and that her brother continues to engage with her ex. We worked on processing this during the session and identifying why this announcement has affected her mood. She noted improvement with her eldest daughter regarding household rules and responsibilities. Therapist praised Sierria for her effort in this area. We worked on processing her current stressors. Samiyyah noted a need to end the session earlier. Therapist validated and normalized Debara's feelings and experience and provided supportive therapy. A follow-up was scheduled for continued treatment.   Interventions: interpersonal.   Diagnosis:    Generalized anxiety disorder  Psychiatric Treatment: Yes , via PCP. See chart.    Treatment Plan:  Client Abilities/Strengths Roma is forthcoming, self-aware, and motivated for change.   Support System: Boyfriend, friends.   Client Treatment Preferences Outpatient Therapy.   Client Statement of Needs Orielle would like to process parenting stressors, reduce negative self-talk,  self-care, managing mood proactively, increasing frustration tolerance, managing frustration, and engaging socially.   Treatment Level Weekly  Symptoms  Anxiety: anxious, irritability, difficulty managing worry, low frustration tolerance, difficulty leaving the home, social anxiety.    (Status: maintained) Depression: poor sleep, lethargy.    (Status: maintained)  Goals:   November experiences symptoms of depression and anxiety.    Target Date: 01/20/24 Frequency: Weekly  Progress: 0 Modality: individual    Therapist will provide referrals for additional resources as appropriate.  Therapist will provide psycho-education regarding [name]'s diagnosis and corresponding treatment approaches and interventions. Licensed Clinical Social Worker, [place], LCSW will support the patient's ability to achieve the goals identified. will employ CBT, BA, Problem-solving, Solution Focused, Mindfulness,  coping skills, & other evidenced-based practices will be used to promote progress towards healthy functioning to help manage decrease symptoms associated with her diagnosis.   Reduce overall level, frequency, and intensity of the feelings of depression and anxiety as evidenced by decreased  from 6 to 7 days/week to 0 to 1 days/week per client report for at least 3 consecutive months. Verbally express understanding of the relationship between feelings of depression, anxiety and their impact on thinking patterns and behaviors. Verbalize an understanding of the role that distorted thinking plays in creating  fears, excessive worry, and ruminations.    (  Akima participated in the creation of the treatment plan)    [name], LCSW

## 2023-03-09 ENCOUNTER — Ambulatory Visit
Admission: RE | Admit: 2023-03-09 | Discharge: 2023-03-09 | Disposition: A | Discharge status: Home / Self Care | Payer: Medicaid Other | Source: Ambulatory Visit | Attending: Obstetrics and Gynecology | Admitting: Obstetrics and Gynecology

## 2023-03-09 DIAGNOSIS — Z87898 Personal history of other specified conditions: Secondary | ICD-10-CM | POA: Diagnosis not present

## 2023-03-09 DIAGNOSIS — N6489 Other specified disorders of breast: Secondary | ICD-10-CM | POA: Diagnosis not present

## 2023-03-15 ENCOUNTER — Ambulatory Visit (INDEPENDENT_AMBULATORY_CARE_PROVIDER_SITE_OTHER): Payer: Medicaid Other | Admitting: Psychology

## 2023-03-15 DIAGNOSIS — F411 Generalized anxiety disorder: Secondary | ICD-10-CM

## 2023-03-15 NOTE — Progress Notes (Signed)
[facility] Counselor/Therapist Progress Note  Patient ID: Christine Rice, MRN: [id number]   Date: 03/15/23  Time Spent: 9:05 am - 9:50 am : 45 Minutes  Treatment Type: Individual Therapy.  Reported Symptoms: depression and anxiety.   Mental Status Exam: Appearance:  Well Groomed     Behavior: Appropriate  Motor: Normal  Speech/Language:  Normal Rate  Affect: Appropriate  Mood: normal  Thought process: normal  Thought content:   WNL  Sensory/Perceptual disturbances:   WNL  Orientation: oriented to person, place, time/date, and situation  Attention: Good  Concentration: Good  Memory: WNL  Fund of knowledge:  Good  Insight:   Good  Judgment:  Good  Impulse Control: Good   Risk Assessment: Danger to Self:  No Self-injurious Behavior: No Danger to Others: No Duty to Warn:no Physical Aggression / Violence:No  Access to Firearms a concern: No  Gang Involvement:No   Subjective:   Christine Rice participated from home, via video and consented to treatment. Therapist participated from home office. We met online due to [place] pandemic. Christine Rice reviewed the events of the past week. Christine Rice noted increasing stressors with her mother and noted advocating for self. She noted going on a "rant" regarding her brother and his lack of support for her and empathy of her experience. She noted her self-talk "I wasn't good enough to be treated better" in relation to a breakup in the past. She wondered if her childhood trauma locked her perception of her value. She noted the that not being approached by a certain type of people, in dating situations, is a reflection of her value. She noted her evidence of this via past relationship. We processed this and worked on identifying her thinking in relation to these relationships. Therapist challenged negative cognitions and provided evidence for this. Christine Rice noted a recent interaction with her boyfriend that upset her. We explored this  during the session and worked on identifying communication points, assertiveness, and further delineation of her goals. Therapist validated and normalized Christine Rice's feelings and experience and provided supportive therapy. Therapist encouraged continued efforts to be mindful and challenge negative self-talk. A follow-up was scheduled for continued treatment.   Interventions: interpersonal & CBT  Diagnosis:   Generalized anxiety disorder  Psychiatric Treatment: Yes , via PCP. See chart.    Treatment Plan:  Client Abilities/Strengths Christine Rice is forthcoming, self-aware, and motivated for change.   Support System: Boyfriend, friends.   Client Treatment Preferences Outpatient Therapy.   Client Statement of Needs Christine Rice would like to process parenting stressors, reduce negative self-talk,  self-care, managing mood proactively, increasing frustration tolerance, managing frustration, and engaging socially.   Treatment Level Weekly  Symptoms  Anxiety: anxious, irritability, difficulty managing worry, low frustration tolerance, difficulty leaving the home, social anxiety.    (Status: maintained) Depression: poor sleep, lethargy.    (Status: maintained)  Goals:   Christine Rice experiences symptoms of depression and anxiety.    Target Date: 01/20/24 Frequency: Weekly  Progress: 0 Modality: individual    Therapist will provide referrals for additional resources as appropriate.  Therapist will provide psycho-education regarding Christine Rice's diagnosis and corresponding treatment approaches and interventions. Licensed Clinical Social Worker, [place], LCSW will support the patient's ability to achieve the goals identified. will employ CBT, BA, Problem-solving, Solution Focused, Mindfulness,  coping skills, & other evidenced-based practices will be used to promote progress towards healthy functioning to help manage decrease symptoms associated with her diagnosis.   Reduce overall level,  frequency, and intensity of the  feelings of depression and anxiety as evidenced by decreased  from 6 to 7 days/week to 0 to 1 days/week per client report for at least 3 consecutive months. Verbally express understanding of the relationship between feelings of depression, anxiety and their impact on thinking patterns and behaviors. Verbalize an understanding of the role that distorted thinking plays in creating fears, excessive worry, and ruminations.    Christine Rice participated in the creation of the treatment plan)    [name], LCSW

## 2023-04-04 ENCOUNTER — Ambulatory Visit (INDEPENDENT_AMBULATORY_CARE_PROVIDER_SITE_OTHER): Payer: Medicaid Other | Admitting: Psychology

## 2023-04-04 DIAGNOSIS — F411 Generalized anxiety disorder: Secondary | ICD-10-CM | POA: Diagnosis not present

## 2023-04-04 NOTE — Progress Notes (Signed)
[facility] Counselor/Therapist Progress Note  Patient ID: Christine Rice, MRN: [id number]   Date: 04/04/23  Time Spent: 3:07 pm - 3:50 pm : 43 Minutes  Treatment Type: Individual Therapy.  Reported Symptoms: depression and anxiety.   Mental Status Exam: Appearance:  Well Groomed     Behavior: Appropriate  Motor: Normal  Speech/Language:  Normal Rate  Affect: Appropriate  Mood: normal  Thought process: normal  Thought content:   WNL  Sensory/Perceptual disturbances:   WNL  Orientation: oriented to person, place, time/date, and situation  Attention: Good  Concentration: Good  Memory: WNL  Fund of knowledge:  Good  Insight:   Good  Judgment:  Good  Impulse Control: Good   Risk Assessment: Danger to Self:  No Self-injurious Behavior: No Danger to Others: No Duty to Warn:no Physical Aggression / Violence:No  Access to Firearms a concern: No  Gang Involvement:No   Subjective:   [name] participated from home, via video and consented to treatment. Therapist participated from home office. We met online due to COVID pandemic. Lorain reviewed the events of the past week. She noted this day was a tense day in the home but could not identify why. She noted a need to not "fly red hot" on my mom and to begin managing this more proactively. She noted discord between her and her boyfriend and we explored this during the session. She noted not giving feedback and often feeling resentful. We explored this trepidation during the session and discussed the importance of communicating feelings, assertiveness, and setting boundaries.We discussed the importance of healthy relationship dynamics, communicating needs, and maintaining boundaries. Therapist modeled positive and assertive communication skills. therapist validated and normalized [name]'s feelings and experience and provided supportive therapy.  A follow-up was scheduled for continued treatment.   Interventions:  interpersonal & CBT  Diagnosis:   Generalized anxiety disorder  Psychiatric Treatment: Yes , via PCP. See chart.    Treatment Plan:  Client Abilities/Strengths Isa is forthcoming, self-aware, and motivated for change.   Support System: Boyfriend, friends.   Client Treatment Preferences Outpatient Therapy.   Client Statement of Needs [name] would like to process parenting stressors, reduce negative self-talk,  self-care, managing mood proactively, increasing frustration tolerance, managing frustration, and engaging socially.   Treatment Level Weekly  Symptoms  Anxiety: anxious, irritability, difficulty managing worry, low frustration tolerance, difficulty leaving the home, social anxiety.    (Status: maintained) Depression: poor sleep, lethargy.    (Status: maintained)  Goals:   [name] experiences symptoms of depression and anxiety.    Target Date: 01/20/24 Frequency: Weekly  Progress: 0 Modality: individual    Therapist will provide referrals for additional resources as appropriate.  Therapist will provide psycho-education regarding [name]'s diagnosis and corresponding treatment approaches and interventions. Licensed Clinical Social Worker, [place], LCSW will support the patient's ability to achieve the goals identified. will employ CBT, BA, Problem-solving, Solution Focused, Mindfulness,  coping skills, & other evidenced-based practices will be used to promote progress towards healthy functioning to help manage decrease symptoms associated with her diagnosis.   Reduce overall level, frequency, and intensity of the feelings of depression and anxiety as evidenced by decreased  from 6 to 7 days/week to 0 to 1 days/week per client report for at least 3 consecutive months. Verbally express understanding of the relationship between feelings of depression, anxiety and their impact on thinking patterns and behaviors. Verbalize an understanding of the role that  distorted thinking plays in creating fears, excessive worry,  and ruminations.    [name] participated in the creation of the treatment plan)    [name], LCSW

## 2023-04-21 ENCOUNTER — Other Ambulatory Visit: Payer: Self-pay | Admitting: Family Medicine

## 2023-04-21 MED ORDER — ALPRAZOLAM 0.25 MG PO TABS: 0.2500 mg | ORAL_TABLET | Freq: Every day | ORAL | 0 refills | Status: DC | PRN

## 2023-04-21 NOTE — Telephone Encounter (Signed)
Name of Medication: Xanax Name of Pharmacy: [facility] Date and Quantity: 11/10/22 #30 tabs/ 0 refills Last Office Visit and Type: 11/10/22 with Dr. Patsy Lager Next Office Visit and Type: none scheduled

## 2023-04-27 ENCOUNTER — Ambulatory Visit: Payer: Medicaid Other | Admitting: Psychology

## 2023-05-12 ENCOUNTER — Encounter: Payer: Self-pay | Admitting: Internal Medicine

## 2023-05-12 DIAGNOSIS — B182 Chronic viral hepatitis C: Secondary | ICD-10-CM

## 2023-05-16 ENCOUNTER — Telehealth (HOSPITAL_BASED_OUTPATIENT_CLINIC_OR_DEPARTMENT_OTHER): Payer: Self-pay

## 2023-05-16 NOTE — Telephone Encounter (Signed)
Jacqueline Fernandez from [facility] called to confirm if referral for HIV priam ry care services was received. Agent was unable to locate a referral received in May. Agent provided referrals fax [phone]. Please assist to upload referral into file. Jacqueline Fernandez requested patient was contacted once able to schedule.      Ph#: [phone]

## 2023-05-17 ENCOUNTER — Encounter (HOSPITAL_BASED_OUTPATIENT_CLINIC_OR_DEPARTMENT_OTHER): Payer: Self-pay

## 2023-05-17 ENCOUNTER — Ambulatory Visit (INDEPENDENT_AMBULATORY_CARE_PROVIDER_SITE_OTHER): Payer: Medicaid Other | Admitting: Psychology

## 2023-05-17 DIAGNOSIS — F411 Generalized anxiety disorder: Secondary | ICD-10-CM
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
## 2023-05-18 ENCOUNTER — Other Ambulatory Visit: Payer: Self-pay | Admitting: Family Medicine

## 2023-05-18 NOTE — Telephone Encounter (Signed)
Name of Medication: Xanax Name of Pharmacy: [facility] or [facility] Date and Quantity: 04/21/23 #30 tabs/ 0 refills Last Office Visit and Type: acute appt 11/10/22 with Dr. Patsy Lager Next Office Visit and Type: none scheduled

## 2023-05-19 NOTE — Telephone Encounter (Signed)
Estevan from [facility] is following up on message below. Stated they faxed referral to [place] 05-16-2023 to [phone].      Best call back # [phone].    Please assist. Thank you

## 2023-05-29 ENCOUNTER — Telehealth (HOSPITAL_BASED_OUTPATIENT_CLINIC_OR_DEPARTMENT_OTHER): Payer: Self-pay | Admitting: Internal Medicine

## 2023-05-29 ENCOUNTER — Encounter (HOSPITAL_BASED_OUTPATIENT_CLINIC_OR_DEPARTMENT_OTHER): Payer: Self-pay | Admitting: Internal Medicine

## 2023-05-29 DIAGNOSIS — B20 Human immunodeficiency virus [HIV] disease: Secondary | ICD-10-CM

## 2023-05-29 NOTE — Telephone Encounter (Signed)
New intake scheduled for 06-05-2023 at 1:00.   Thank you.

## 2023-06-05 ENCOUNTER — Telehealth (HOSPITAL_BASED_OUTPATIENT_CLINIC_OR_DEPARTMENT_OTHER): Payer: Self-pay

## 2023-06-05 ENCOUNTER — Ambulatory Visit: Payer: Medicaid Other | Admitting: Pharmacist Clinician (PhC)/ Clinical Pharmacy Specialist

## 2023-06-05 ENCOUNTER — Encounter (HOSPITAL_BASED_OUTPATIENT_CLINIC_OR_DEPARTMENT_OTHER): Payer: Self-pay

## 2023-06-05 VITALS — BP 120/85 | HR 101 | Temp 96.8°F | Ht 62.0 in | Wt 153.4 lb

## 2023-06-05 DIAGNOSIS — B20 Human immunodeficiency virus [HIV] disease: Secondary | ICD-10-CM

## 2023-06-05 MED ORDER — BICTEGRAVIR-EMTRICITAB-TENOFOV 50-200-25 MG PO TABS
1.0000 | ORAL_TABLET | Freq: Every day | ORAL | 0 refills | Status: DC
Start: 2023-06-05 — End: 2023-09-25

## 2023-06-05 NOTE — Progress Notes (Signed)
[facility] ARV Therapy progress note    S: Jacqueline Fernandez is a 42 year old female living with HIV who is here to re-establish care. Patient had been part of [facility] program, but moved to [place] where her mother lives with her children. Was there for some time, but moved back about 1 year ago.  She comes today with a CM from [facility].     Is on biktarvy.     Angellina is currently living between two homes. Living situation is unstable and she is looking for assistance.     Had some left lower edema that resolved that she mentions, but none now. No active issues. Wants to get her health back on track.     Name patient would like to be called: Jacqueline Fernandez      HIV History  Date of Diagnosis: 09/2014  Date of Prior HIV Negative test: first  How do you think you acquired HIV (Risk Factors): IVDU/heterosexual  CD4 at Diagnosis: 251   CD4 Nadir: 251  H/O AIDS defining illnesses/HIV complications: done  Date of most recent labs: 04/2023  HIV pathophysiology review: u=u  Ever taken PEP/PrEP: no    Sexual Identity/History  Sex at Birth: female  Gender identity: female  Pronouns: she/her/hers  Do you choose to describe yourself as (straight/gay/lesbian/bisexual/transgender/transexual/gender nonconforming) no label  Sexually active: yes   HIV Status of Partners: no   Condom use: no, partner tested  H/O Syphilis: no    Social History:  Tobacco use: smoke cigs, 1/2 pack/day; since age 17  EtoH Use: first drink today but had skipped a few days.   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  [employer]   Clinic limitations (days) and provider preference (M/F): none    Medical History  Medical history:   Hep c  Cervical dysplasia, needs f/u  History of hepatitis: no  Current symptoms (HA, photophobia, stiff, neck, fever, night sweats, cough, weight  loss): none  IF CHILD BEARING: kids with mom (17,16,15 ). Has had tubal ligation.  Family Medical history:   Mom: alive, around 95 [employer] tumor/cyst (hysterectomy)  Birth dad alive, 62;   Allergies: nkda  Preferred [facility]  Do you need refills? yes    Patient has a lot of social needs. She does have a CM thru [facility] that she plans to keep. Her CM is [place] and pt seems to be connected to her so we did not do an assessment today.     O:               Latest Ref Rng & Units 10/05/2017     1:20 PM 09/29/2016    12:37 PM 01/19/2016     1:46 PM 06/23/2015     5:07 PM 04/21/2015     3:17 PM 12/30/2014     2:10 PM 12/16/2014     3:20 PM   HIV Serology   CD4 Count 250 - 1,200 cells/uL 818  621  515  -- 468  251  --   CD4 % 29 - 61 % 44  41  41  -- 36  29  --   CD8 Count 100 - 800 cells/uL 702  529  504  -- 539  446  --   CD8 % 11 - 38 % 37  35  40  -- 41  51  --   CD4:CD8 Ratio 0.70 - 4.00 1.16  1.17  1.02  -- 0.87  0.56  --   RNA-PCR Ultra See Comment copies/mL 62  Not Detected  Not Detected  Not Detected  Not Detected  -- 1,756       Lab Results   Component Value Date    CREAT 0.65 10/05/2017    GFRNON >60 10/05/2017      Lab Results   Component Value Date    HGB 14.0 10/05/2017    HCT 41.4 10/05/2017    MCV 87.7 10/05/2017    PLT 183 10/05/2017    WBC 6.0 10/05/2017    ABSNEUTRO 3.5 10/05/2017      Lab Results   Component Value Date    AST 15 10/05/2017    ALT 13 10/05/2017    ALK 52 10/05/2017    TP 7.3 10/05/2017    ALB 4.4 10/05/2017    TBILI 0.45 10/05/2017    DBILI <0.2 04/21/2015              A:    1. ARV   Current regimen of biktarvy since around 2018.   Adherence: takes most days  Tolerance: good, no complaints.   Response:  reports suppression. Will need labs to reassess  ARV history: low confidence.   12/2014-02/2015  stribild  02/2015 -04/2015 Complera  04/21/2015-09/2017 Odefsey  09/2017  Biktarvy    Baseline Genotype completed on 12/16/2014.  Results were NRTI: none, NNRTI: none, PI: none.   11/28/2019  genosure prime: NRTI: none, NNRTI: I135T, I178L, INI: none, PI: I62V  Antiretroviral Therapy assessment: Will continue biktarvy and check labs    2. OI prophylaxis:likely none needed    3. Substance use disorder (alcohol/meth)- is aware of recovery team at [facility]. Declines services at this time as she is working on this on her own.   4. Reports of cervical dyplasia. Was due for a procedure (colposcopy vs cryo, LEEP, other?) but says she didn't make it to the appointment. Is worried about that and would like follow up  5. Housing: gave [facility] for [facility] and they are aware of neighborhood housing. CM is going to help patient reach out. Patient was connected to fem in [place] in past and wants to come back. She is going to keep her current CM. Will schedule her with blanchard   6. Swabs: engages in oral, rectal sex so we were planning on doing swabs but forgot and pt left w/out being done. Can do when she comes back.   7. Insurance: has [facility]. She can come here but [facility]. Alternatively if she changes to [facility], then she will not be eligible for enhanced case management thru [facility]. CM says that she can assist patient with [facility].        P:    1. ARVs: continue biktarvy 1 tab daily  2. Lab orders: Lab   Orders Placed This Encounter   Procedures    Hepatitis C Genotyping, Blood Yellow serum separator tube    HIV-1 Quantitative Viral Load Assay, Plasma - See Instructions    Hepatitis B Surface Antibody Quantitative, Liver Transplant - [facility]    Hepatitis B Surface Antibody, Quant    Hepatitis A IgM Antibody, Blood    Hepatitis B Surface Ab, Blood    Measles IgG Antibody, Blood Yellow serum separator tube    Comprehensive Metabolic Panel -[facility]    Quantiferon-TB,  Blood Quantiferon Tube Set     3. Oral/rectal swabs with next visit  4. F/u as scheduled for new gen. Will need f/u on cervical dysplasia.     Carmelina Dane, PharmD

## 2023-06-05 NOTE — Telephone Encounter (Signed)
Pn intake appt    PN-CONTACT    L/VM REMINDER      This is a reminder of your [facility] appt:    06/05/2023 at [facility]   [address]  [address] [place] [address]    [facility]: [phone]    Please call if you are not able to make this appt so we can reschedule      Thank You,    Toribio Harbour  Patient Navigator lll  [facility]  [address]  [place], [place] [address]  [place]: [phone]  Email: [email or web address]  Fax: [phone]

## 2023-06-13 ENCOUNTER — Ambulatory Visit: Payer: Medicaid Other | Admitting: Psychology

## 2023-06-13 DIAGNOSIS — F411 Generalized anxiety disorder: Secondary | ICD-10-CM

## 2023-06-13 NOTE — Progress Notes (Signed)
[facility] Counselor/Therapist Progress Note  Patient ID: Christine Rice, MRN: [id number]   Date: 06/13/23  Time Spent: 4:26 pm - 4:44 pm : 18 Minutes  Treatment Type: Individual Therapy.  Reported Symptoms: depression and anxiety.   Mental Status Exam: Appearance:  Well Groomed     Behavior: Appropriate  Motor: Normal  Speech/Language:  Normal Rate  Affect: Appropriate  Mood: anxious  Thought process: normal  Thought content:   WNL  Sensory/Perceptual disturbances:   WNL  Orientation: oriented to person, place, time/date, and situation  Attention: Good  Concentration: Good  Memory: WNL  Fund of knowledge:  Good  Insight:   Good  Judgment:  Good  Impulse Control: Good   Risk Assessment: Danger to Self:  No Self-injurious Behavior: No Danger to Others: No Duty to Warn:no Physical Aggression / Violence:No  Access to Firearms a concern: No  Gang Involvement:No   Subjective:   TOIYA NACHTMAN participated from home, via video, is aware of the limitations of tele-sessions, and consented to treatment. Therapist participated from office. We met online due to COVID pandemic. Shylo reviewed the events of the past week. We discussed the importance of starting the session on time and worked on identifying a timeline that can support starting on time. She noted things being busy do starting a new part-time job. She noted feeling insecure in her relationship with her partner. We explored this during the session. Therapist highlighted a lack of communication and resolution on issues that she brings up and we worked on identifying barriers. Therapist encouraged Zetta to identify time periods, for sessions, that will allow for [place] to start on time going forward. Sway ended the session early due to a sudden lack of privacy. A follow-up was scheduled for continued treatment.   Interventions: interpersonal & CBT  Diagnosis:   Generalized anxiety  disorder  Psychiatric Treatment: Yes , via PCP. See chart.    Treatment Plan:  Client Abilities/Strengths Elliahna is forthcoming, self-aware, and motivated for change.   Support System: Boyfriend, friends.   Client Treatment Preferences Outpatient Therapy.   Client Statement of Needs Joleena would like to process parenting stressors, reduce negative self-talk,  self-care, managing mood proactively, increasing frustration tolerance, managing frustration, and engaging socially.   Treatment Level Weekly  Symptoms  Anxiety: anxious, irritability, difficulty managing worry, low frustration tolerance, difficulty leaving the home, social anxiety.    (Status: maintained) Depression: poor sleep, lethargy.    (Status: maintained)  Goals:   Avarae experiences symptoms of depression and anxiety.    Target Date: 01/20/24 Frequency: Weekly  Progress: 0 Modality: individual    Therapist will provide referrals for additional resources as appropriate.  Therapist will provide psycho-education regarding Avilene's diagnosis and corresponding treatment approaches and interventions. Licensed Clinical Social Worker, [place], LCSW will support the patient's ability to achieve the goals identified. will employ CBT, BA, Problem-solving, Solution Focused, Mindfulness,  coping skills, & other evidenced-based practices will be used to promote progress towards healthy functioning to help manage decrease symptoms associated with her diagnosis.   Reduce overall level, frequency, and intensity of the feelings of depression and anxiety as evidenced by decreased  from 6 to 7 days/week to 0 to 1 days/week per client report for at least 3 consecutive months. Verbally express understanding of the relationship between feelings of depression, anxiety and their impact on thinking patterns and behaviors. Verbalize an understanding of the role that distorted thinking plays in creating fears, excessive worry, and  ruminations.    (  Akima participated in the creation of the treatment plan)    [name], LCSW

## 2023-06-14 ENCOUNTER — Telehealth (HOSPITAL_BASED_OUTPATIENT_CLINIC_OR_DEPARTMENT_OTHER): Payer: Self-pay

## 2023-06-14 NOTE — Telephone Encounter (Signed)
Pn intake appt    Client Screening Tool for Intake Visits:    Is the client able to access and stay in HIV Medical Care? Mark "X" for Yes or No.    -Enrollment in HIV Care:     yes  -Following Medical Plan:              yes  -Keeping Medical Appointments:  yes  -Taking Medications as Prescribed:  yes  -Pt recently released from jail:                 no     If any of the other above questions have an answer of "NO," then please complete acuity scale at time of intake.     Also, please complete acuity scale if patient is newly diagnosed with HIV.     -Newly diagnosed with HIV:                   NO    SW Services Needed:  pt declined at this time.        Thank You,    Monica Jones  Patient Navigator lll  [facility]  [address]  [place], [place] [address]  [place]: [phone]  Email: [email or web address]  Fax: [phone]

## 2023-06-16 ENCOUNTER — Other Ambulatory Visit: Payer: Self-pay | Admitting: Family Medicine

## 2023-06-16 NOTE — Telephone Encounter (Signed)
Name of Medication: Xanax Name of Pharmacy: [facility] Date and Quantity: 05/18/23 #30 tab/ 0 refill Last Office Visit and Type: 07/08/22 f/u with PCP Next Office Visit and Type: 07/12/23 CPE

## 2023-06-16 NOTE — Telephone Encounter (Signed)
Patient scheduled.

## 2023-06-16 NOTE — Telephone Encounter (Signed)
Last OV was PCP was 07/08/22, please schedule a CPE (labs prior) or at least a med refill f/u appt for later this month or beginning of Aug., and then route back to me. thanks

## 2023-07-05 ENCOUNTER — Other Ambulatory Visit: Payer: Medicaid Other

## 2023-07-05 ENCOUNTER — Telehealth: Payer: Self-pay | Admitting: Family Medicine

## 2023-07-05 DIAGNOSIS — Z0000 Encounter for general adult medical examination without abnormal findings: Secondary | ICD-10-CM

## 2023-07-05 NOTE — Telephone Encounter (Signed)
-----   Message from Lovena Neighbours sent at 06/20/2023  2:04 PM EDT ----- Regarding: Fasting labs for 7.25.24 Please put physical lab orders in future. Thank you, Denny Peon

## 2023-07-06 ENCOUNTER — Other Ambulatory Visit: Payer: Medicaid Other

## 2023-07-07 ENCOUNTER — Ambulatory Visit: Payer: Medicaid Other | Admitting: Psychology

## 2023-07-07 DIAGNOSIS — F411 Generalized anxiety disorder: Secondary | ICD-10-CM | POA: Diagnosis not present

## 2023-07-07 NOTE — Progress Notes (Signed)
[facility] Counselor/Therapist Progress Note  Patient ID: SAMMY DARRAS, MRN: [id number]   Date: 07/07/23  Time Spent: 11:04 am - 11:59 am : 55 Minutes  Treatment Type: Individual Therapy.  Reported Symptoms: depression and anxiety.   Mental Status Exam: Appearance:  Well Groomed     Behavior: Appropriate  Motor: Normal  Speech/Language:  Normal Rate  Affect: Appropriate  Mood: anxious  Thought process: normal  Thought content:   WNL  Sensory/Perceptual disturbances:   WNL  Orientation: oriented to person, place, time/date, and situation  Attention: Good  Concentration: Good  Memory: WNL  Fund of knowledge:  Good  Insight:   Good  Judgment:  Good  Impulse Control: Good   Risk Assessment: Danger to Self:  No Self-injurious Behavior: No Danger to Others: No Duty to Warn:no Physical Aggression / Violence:No  Access to Firearms a concern: No  Gang Involvement:No   Subjective:   RINNA AMODEI participated from home, via video, is aware of the limitations of tele-sessions, and consented to treatment. Therapist participated from home office. Margerie reviewed the events of the past week. She noted the recent recognition that she and her superior have different perspectives on various issues. We worked on identifying boundaries going forward and therapist modeled this during the session. She noted experiencing some relationship stressors in relation to her partner being more pessimistic. She noted her efforts to communicate her concerns but noted this being ineffective. She noted that the relationship has changed and noted being treated differently than when she started dating. We explored this during the session. We worked on identifying her needs in a relationship and therapist encouraged Jahnyla to create a list of said needs. We worked on communicating needs, using empathy, and being assertive. We will work on processing her needs, during the follow-up, and  identify ways to communicate needs. Laquandria was engaged and motivated during the session and expressed commitment towards our goals. Therapist praised Tomiye and provided supportive therapy. A follow-up was scheduled for continued treatment.   Interventions: interpersonal & CBT  Diagnosis:   Generalized anxiety disorder  Psychiatric Treatment: Yes , via PCP. See chart.    Treatment Plan:  Client Abilities/Strengths Betzaira is forthcoming, self-aware, and motivated for change.   Support System: Boyfriend, friends.   Client Treatment Preferences Outpatient Therapy.   Client Statement of Needs Kerensa would like to process parenting stressors, reduce negative self-talk,  self-care, managing mood proactively, increasing frustration tolerance, managing frustration, and engaging socially.   Treatment Level Weekly  Symptoms  Anxiety: anxious, irritability, difficulty managing worry, low frustration tolerance, difficulty leaving the home, social anxiety.    (Status: maintained) Depression: poor sleep, lethargy.    (Status: maintained)  Goals:   Breajah experiences symptoms of depression and anxiety.    Target Date: 01/20/24 Frequency: Weekly  Progress: 0 Modality: individual    Therapist will provide referrals for additional resources as appropriate.  Therapist will provide psycho-education regarding Willa's diagnosis and corresponding treatment approaches and interventions. Licensed Clinical Social Worker, [place], LCSW will support the patient's ability to achieve the goals identified. will employ CBT, BA, Problem-solving, Solution Focused, Mindfulness,  coping skills, & other evidenced-based practices will be used to promote progress towards healthy functioning to help manage decrease symptoms associated with her diagnosis.   Reduce overall level, frequency, and intensity of the feelings of depression and anxiety as evidenced by decreased  from 6 to 7 days/week to 0 to  1 days/week per client report for at  least 3 consecutive months. Verbally express understanding of the relationship between feelings of depression, anxiety and their impact on thinking patterns and behaviors. Verbalize an understanding of the role that distorted thinking plays in creating fears, excessive worry, and ruminations.    [name] participated in the creation of the treatment plan)    [name], LCSW

## 2023-07-09 NOTE — Progress Notes (Signed)
[facility]    NURSE PRACTITIONER: Yancey Flemings, MSN, FNP-BC        Date / Time: Monday July 10, 2023      Referring provider: Fredrich Romans     Please send a copy of the consult to the referring physician      Reason for visit: Hep C eval//treat    Type of visit: New patient    Patient Active Problem List   Diagnosis    Human immunodeficiency virus (HIV) disease (CMS-HCC)    History of methamphetamine abuse (CMS-HCC)    History of alcohol abuse    LGSIL of cervix of undetermined significance       History of present illness:   Jacqueline Fernandez is a 42 year old female HIV on ART, and recent dx of chronic HCV, presents for eval, treatment.     Pt with  HIV + on Bictarvy, managed by [facility]. No prior HCV treatment. Hx of IV /IN drug use, likely how infected. Denies current use. Reports heavy ETOH use, drinks most days, 5-12 drinks: up to a bottle; denies with drawl symptoms/tremor or withdrawal seizure. She has been in rehab programs in the past, last 2016 was abstinent from ETOH x 3 yrs with a brief relapse, then abstinent x 3 more years till relapsed again.     No h/o GIB/EGD, ascites or paracentesis, HE or jaundice episodes.    Outside labs (in [place])  AST 61  ALT 133  Tbili 0.8  Cr 0.79    Reports had labs last week at [facility],  ordered by her Cornelius Moras MD        Hepatitis C History:  Genotype: unknown Baseline VL 258,000  IU/mL  Prior HCV Treatment: none  Prior treatment response: n/a  Baseline polymorphisms:n/a  Fibrosis: pending    Potential DDIs: Bictarvy    Past medical history:  Past Medical History:   Diagnosis Date    HIV disease (CMS-HCC)      Psychiatric Illness: none    Past surgical history:  No past surgical history on file.    Current Medications:  bictegravir-emtricitabine-tenofovir (BIKTARVY) 50-200-25 MG tablet, Take 1 tablet by mouth daily.  cholecalciferol (VITAMIN D) 400 units TABS tablet, Take 1 tablet (400 Units) by mouth daily.        Allergy:  No Known  Allergies    Family history: No family history of liver disease or cancer  Family History   Problem Relation Name Age of Onset    No Known Problems Mother      No Known Problems Father         Social history:  Social History     Socioeconomic History    Marital status: Single   Tobacco Use    Smoking status: Every Day     Current packs/day: 1.00     Types: Cigarettes    Smokeless tobacco: Never   Substance and Sexual Activity    Alcohol use: No     Alcohol/week: 0.0 standard drinks of alcohol    Sexual activity: Yes     Partners: Male     Birth control/protection: Surgical     Current Living Situation:  Employment:      Substance Abuse History:  H/o IV/IN drug use; h/o meth use; unclear last use    Alcohol Use History:  Drinks 5-12 drinks daily most days; up to a bottle/d    Dietary supplement use: no      Review  of Systems:  Weight loss:no  Trouble breathing: no  Abdominal pain: no  Bowel movement frequency: daily  Nausea / vomitting: no  Thyroid problems: no  Skin rash: no  Ascites:no  Confusion: no  Other:   Rest of the review of system was negative    No ascites, edema, no hematemesis, hematochezia, melena, no jaundice, pruritis or acholic stools, no hepatic encephalopathy (no overt encephalopathy, no reversal of sleep/wake cycles, no memory/attention issues).     Physical examination:   There were no vitals taken for this visit.   Wt Readings from Last 2 Encounters:   07/10/23 67.6 kg (149 lb)   06/05/23 69.6 kg (153 lb 6.4 oz)      Blood Pressure   07/10/23 114/74   06/05/23 120/85      Pain Score:    General:  Alert and oriented x 3  Affect:  Normal  HEENT:  No scleral icterus, no conjunctival pallor, mucus membranes moist, neck supple without lymphadenopathy, no thryroid abnormalities appreciated  Lungs:  Clear to auscultation bilaterally  Cardiovascular:  No carotid bruit, regular rate and rhythm without murmurs, rubs or gallops.  Abdomen:  Bowel sounds present, liver nonpalable, spleen nonpalpable, no  ascites, no tenderness and no guarding  Extremities:  No peripheral edema  Skin:  Non-icteric, no palmar erythema, no spider angiomas, and no visible rash  Neuro:  No asterixis    Studies:  Labs: Following labs reviewed  Lab Results   Component Value Date    AST 15 10/05/2017    ALT 13 10/05/2017    ALK 52 10/05/2017    TP 7.3 10/05/2017    ALB 4.4 10/05/2017    TBILI 0.45 10/05/2017    DBILI <0.2 04/21/2015      Lab Results   Component Value Date    WBC 6.0 10/05/2017    RBC 4.72 10/05/2017    HGB 14.0 10/05/2017    HCT 41.4 10/05/2017    MCV 87.7 10/05/2017    MCHC 33.8 10/05/2017    RDW 12.5 10/05/2017    PLT 183 10/05/2017    MPV 11.4 10/05/2017      Lab Results   Component Value Date    NA 139 10/05/2017    K 4.3 10/05/2017    CL 103 10/05/2017    BICARB 22 10/05/2017    BUN 9 10/05/2017    CREAT 0.65 10/05/2017    GLU 83 10/05/2017    [place] 9.0 10/05/2017      Lab Results   Component Value Date    BUN 9 10/05/2017    CREAT 0.65 10/05/2017    CL 103 10/05/2017    NA 139 10/05/2017    K 4.3 10/05/2017    [place] 9.0 10/05/2017    TBILI 0.45 10/05/2017    ALB 4.4 10/05/2017    TP 7.3 10/05/2017    AST 15 10/05/2017    ALK 52 10/05/2017    BICARB 22 10/05/2017    ALT 13 10/05/2017    GLU 83 10/05/2017        Viral serologies/labs:  Lab Results   Component Value Date    HEPATITISCAB Nonreactive 12/16/2014      No results found for: "HEPBSURFACAB"  No results found for: "HEPCRNA"  No results found for: "HEPBDNAQT"     Imaging:  none    Procedures:  none    IMPRESSIONS: Jacqueline Fernandez is a 42 year old female     Plan:I have examined the patient  and reviewed all available pertinent labs, imaging, procedures and documents in EPIC and media.     Chronic HCV  GTpending, Treatment naive, staging information pending  - I discussed the natural history of Hepatitis C infection including risk of progression to cirrhosis.   - recommend complete alcohol abstinence    - for staging fibroscan ordered  -reviewed DAA therapy including  options, duration, side effects, efficacy and overall process to initiate therapy  -medications reviewed for pot. interaction with DAA: concerns Bictarvy  - Will obtain genotype, viral load, HBV  if not obtained prior  - will check Hepatitis A and B serologies; he will need vaccination against Hepatitis A and B if he is not immune.   - return to clinic at week 4 of DAA tx  - pt has a life expectancy of greater than 12 months referring to non-liver related conditions and is a good candidate for treatment  - pt is motivated to start treatment and has been counseled on the importance of adherence to HCV treatment   - pt understands that non-adherence to treatment may result in virologic relapse or resistance to HCV medications   -msg to pharmacy to assist with obtaining HCV med    Hepatitis C counseling: Patient counseled on transmission reduction by not sharing personal items (e.g., razor, toothbrush, needle/syringe) that might have blood on them.  The prevalence of HCV infection is higher in persons who have multiple sexual partners. However, the risk of transmission between monogamous partners is low (1% - 5%). Experts generally do not recommend the use of barrier protection for monogamous couples. Others may wish to use barrier precautions to lower the limited risk of HCV transmission. Sexual partners may benefit from counseling and testing.     AUD  -discussed risk of progression of liver dz w/ETOH (+HCV)  -enc quit/cut back, enc recovery program  -discussed MAT: not interested at this time: consider for future    HIV  -controlled on ART, followed by [facility]      HCM  -immunizations: up to date/recommend routine immunizations for age from PCP  -avoid alcohol    Follow up:  Review [facility] labs: ensure have labs needed: Hep B Ag, core, Ab, HAV Ag, HCV genotype; CBC, CMP, INR  Fibroscan ordered-should be done prior to tx  msg to pharmacy to assist with obtaining HCV med      RTC:6 weeks (~week 4 of tx)    I  personally spent total 45 minutes in face-to-face and non-face-to-face activities related to the patient's visit today, excluding any separately reportable services/procedures       Dina Rich, NP  Hepatology Nurse Practitioner

## 2023-07-10 ENCOUNTER — Ambulatory Visit: Payer: Medicaid Other | Attending: Internal Medicine | Admitting: Family

## 2023-07-10 VITALS — BP 114/74 | HR 86 | Temp 96.5°F | Resp 18 | Ht 62.0 in | Wt 149.0 lb

## 2023-07-10 DIAGNOSIS — B182 Chronic viral hepatitis C: Secondary | ICD-10-CM | POA: Insufficient documentation

## 2023-07-10 DIAGNOSIS — B1920 Unspecified viral hepatitis C without hepatic coma: Secondary | ICD-10-CM | POA: Insufficient documentation

## 2023-07-10 MED ORDER — CEPHALEXIN 125 MG/5ML OR SUSR: Freq: Four times a day (QID) | ORAL | Status: AC

## 2023-07-10 NOTE — Patient Instructions (Addendum)
Follow up items:   .1. Fibroscan ordered   2. I will verify labs needed have been ordered/done  3. [facility] pharmacist will follow up to assist with getting the Hep C med     Return 6 weeks    As a reminder, please do not go to the lab without confirming you have lab orders in the computer.  If the labs were to be done at [facility], you may call the lab at [phone] to confirm. If they were to be done elsewhere like [facility] or [facility], you may call them directly.      If you have any questions or concerns, please call us at [phone] or send a MyChart message (but do not use that for anything that requires immediate response/emergencies).     Thank you for allowing [place] to participate in your care today.        Regards,     Hassan Rowan, MSN, FNP-BC   Hepatology Nurse Practitioner  Ph. [phone]  Fx: [phone]        Please do not hesitate to call your [facility] Team at [phone] if you have any questions or concerns regarding appointments, procedures, medications, refills or insurance issues.  Fax Number: [phone]  Research Questions: please contact our NAFLD [facility] at [phone]    ______________  General Care for liver Cirrhosis   Avoid all alcohol   If you are struggling to abstain from alcohol, we recommend engaging in AA or Smart Recovery meetings or attending outpatient rehab programs.   Please limit sodium/salt to no more then 2,000mg  daily. Many foods are high in salt/sodium such as soups, canned foods, hot dogs, ramen noodles, tomato juice such as V8, gatorade, pickled foods, lunch meats, etc. Pre-made foods/restaurant/fast food are usually very high in sodium.  Please increase your protein intake in your diet- you can supplement your protein with drinks such as glucerna (if diabetic), Boost, Ensure or whey protein powder. Other sources of protein include fresh or frozen fish, chicken or [place] breast (without skin or marinades), lean cuts of beef or pork, greek yogurt, milk,  unsalted nuts or seeds, dried beans or peas like kidney beans, pinto beans, lentils and split peas, tofu and eggs.   Avoid all NSAIDs for pain (ibuprofen, naproxen, Aleve, Advil, etc). If you have pain or fever you can take up to 2,000mg  of Tylenol (acetaminophen) in 24 hours. Do not take Tylenol if you are drinking alcohol.     _______________  If we have ordered imaging (ultrasound, CT scan or MRI):   - Call radiology scheduling at [phone] to schedule the ordered imaging.   - Your provider will evaluate the imaging within 7 business days after it is completed.   - You can call the liver center to request results if you have not seen results on your MyChart or received a call to review.     ___________________     If we ordered a paracentesis: (a procedure to remove fluid from you abdomen)  - Please call Interventional Radiology (IR) at [phone] to schedule  _________________    If we have ordered an Endoscopy (a procedure to evaluate the health of your esophagus and stomach):   - The clinic will usually contact you to schedule this. If you have not heard from the clinic within 1 week, please call [phone].   - A letter will be sent to your address after you are scheduled with pre-procedure instructions.  __________________  If we ordered a fibroscan (a test to evaluate for any scarring (fibrosis) in the liver, to determine the stage of your liver disease):  - The clinic will usually contact you to schedule this. If you have not heard from the clinic within 2 weeks, please call [phone].  - You must be fasting for 3 hours before fibroscan, otherwise the results will be inaccurate.   - You provider will receive a notice of the interpreted result within 7 business days.

## 2023-07-11 ENCOUNTER — Ambulatory Visit: Payer: Medicaid Other | Attending: Internal Medicine | Admitting: Internal Medicine

## 2023-07-11 ENCOUNTER — Encounter (HOSPITAL_BASED_OUTPATIENT_CLINIC_OR_DEPARTMENT_OTHER): Payer: Self-pay | Admitting: Internal Medicine

## 2023-07-11 ENCOUNTER — Encounter (HOSPITAL_BASED_OUTPATIENT_CLINIC_OR_DEPARTMENT_OTHER): Payer: Self-pay | Admitting: Family

## 2023-07-11 ENCOUNTER — Encounter (HOSPITAL_BASED_OUTPATIENT_CLINIC_OR_DEPARTMENT_OTHER): Payer: Self-pay

## 2023-07-11 VITALS — BP 122/83 | HR 97 | Temp 95.3°F | Resp 19 | Ht 62.0 in | Wt 155.2 lb

## 2023-07-11 DIAGNOSIS — F1510 Other stimulant abuse, uncomplicated: Secondary | ICD-10-CM

## 2023-07-11 DIAGNOSIS — F1020 Alcohol dependence, uncomplicated: Secondary | ICD-10-CM

## 2023-07-11 DIAGNOSIS — B1920 Unspecified viral hepatitis C without hepatic coma: Secondary | ICD-10-CM

## 2023-07-11 DIAGNOSIS — Z23 Encounter for immunization: Secondary | ICD-10-CM

## 2023-07-11 DIAGNOSIS — B20 Human immunodeficiency virus [HIV] disease: Secondary | ICD-10-CM

## 2023-07-11 DIAGNOSIS — B029 Zoster without complications: Secondary | ICD-10-CM

## 2023-07-11 MED ORDER — NALOXONE HCL 4 MG/0.1ML NA LIQD
NASAL | 11 refills | Status: AC
Start: 2023-07-11 — End: ?

## 2023-07-11 MED ORDER — VALACYCLOVIR HCL 1000 MG OR TABS
1000.0000 mg | ORAL_TABLET | Freq: Every day | ORAL | 5 refills | Status: DC
Start: 2023-07-11 — End: 2024-03-14
  Filled 2023-10-18: qty 30, 30d supply, fill #0

## 2023-07-11 MED ORDER — PNEUMOCOCCAL 20-VAL CONJ VACC 0.5 ML IM SUSY
PREFILLED_SYRINGE | INTRAMUSCULAR | Status: AC
Start: 2023-07-11 — End: 2023-07-11
  Filled 2023-07-11: qty 0.5

## 2023-07-11 NOTE — Telephone Encounter (Signed)
Called patient, but unavail to talk due to needing to go to a dentist appt. Hep ph # provided.    CALL TEAM:  -When patient calls back, please schedule fibroscan in the next 2-3 weeks  -Please also schedule 6 week f/u around 08/21/2023 for in-person visit with NP Conner. Thank you.      ------------------------------------------------------------    Dina Rich, NP  [facility]; [facility] Scheduling  Sched:please assist with sched fibroscan 1-2 weeks.    MAs: Pt reports that she had labs at [facility] on Friday: please request/download;

## 2023-07-11 NOTE — Telephone Encounter (Signed)
Spoke with patient and scheduled fibroscan for 08/02/2023.

## 2023-07-11 NOTE — Interdisciplinary (Signed)
Injection/Immunization/Medication Administration Documentation    Patients Name: Jacqueline Fernandez   Patients Date of Birth: [birth year]   Age: 42 year old     For Immunizations only:   " The patient was given a Vaccine Information Statement (CDC VIS) with the most up to date publication date Yes/No: Yes."    2 Patient Identifiers verified.  Before administration, the dose, medication, and physician's order were double-checked.  Patient allergies and any special considerations were thoroughly reviewed and validated before proceeding.    The patient's medical record has been updated to reflect the administration of the immunization(s). Prior to administration, the patient was given a Vaccine Information Statement (VIS) to review.    The patient tolerated the procedure well and received appropriate discharge information.    Cyndia Skeeters, LVN  Signature Generated from [employer], July 11, 2023, 10:39 AM.
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
## 2023-07-12 ENCOUNTER — Telehealth (HOSPITAL_BASED_OUTPATIENT_CLINIC_OR_DEPARTMENT_OTHER): Payer: Self-pay

## 2023-07-12 ENCOUNTER — Other Ambulatory Visit: Payer: Self-pay

## 2023-07-12 ENCOUNTER — Encounter (INDEPENDENT_AMBULATORY_CARE_PROVIDER_SITE_OTHER): Payer: Self-pay

## 2023-07-12 ENCOUNTER — Encounter: Payer: Medicaid Other | Admitting: Family Medicine

## 2023-07-12 DIAGNOSIS — B1920 Unspecified viral hepatitis C without hepatic coma: Secondary | ICD-10-CM

## 2023-07-12 MED ORDER — SOFOSBUVIR-VELPATASVIR 400-100 MG PO TABS
1.0000 | ORAL_TABLET | Freq: Every day | ORAL | 2 refills | Status: DC
Start: 2023-07-12 — End: 2023-08-30
  Filled 2023-07-12: qty 28, 28d supply, fill #0

## 2023-07-12 MED ORDER — SULFAMETHOXAZOLE-TRIMETHOPRIM 800-160 MG OR TABS
1.00 | ORAL_TABLET | Freq: Every day | ORAL | Status: AC
Start: 2023-07-02 — End: 2023-07-12

## 2023-07-12 MED ORDER — CEPHALEXIN 500 MG OR CAPS
500.00 mg | ORAL_CAPSULE | Freq: Four times a day (QID) | ORAL | Status: AC
Start: 2023-07-02 — End: 2023-07-12

## 2023-07-12 NOTE — Progress Notes (Signed)
---------------------(data below generated by Birdena Jubilee, PHARMD)--------------------     Patient Verification & Telephone Consent & Financial Waiver:    1. Identity: I have verified this patient's identity to be accurate.   2. Consent: I have personally obtained verbal consent from the patient/ surrogate (noting all elements below) to perform this voluntary telephone clinical evaluation (including obtaining history, performing examination and reviewing data provided by the patient). The patient/ surrogate has the right to refuse this evaluation. I have explained risks (including potential loss of confidentiality), benefits, alternatives, and the potential need for subsequent face to face care. Patient/ surrogate understands that there is a risk of medical inaccuracies given that our recommendations will be made based on reported data (and we must therefore assume this information is accurate). Knowing that there is a risk that this information is not reported accurately, and that the audio, or data transmitted may be incomplete, the patient/ surrogate agrees to proceed with evaluation and holds [place] harmless knowing these risks.  3. Healthcare Team: The patient/ surrogate has been notified that other healthcare professionals (including students, residents and Engineer, maintenance) may be involved in this telephone evaluation. All laws concerning confidentiality and patient access to medical records and copies of medical records apply to telephone evaluations.  4. Privacy: I have verbally provided the patient/ surrogate with the [facility] web link in [place] (https://health.dDotCom.si.aspx) or Spanish (https://health.LavishToys.ch.aspx). The patient/ surrogate acknowledges both being provided the NPP link, and has been offered to have the NPP mailed to the patient/ surrogate by [place] mail. The patient/ surrogate has voiced understanding an acknowledgement of receipt of this NPP  web address. If the patient/surrogate has elected to receive the NPP via [place] mail, I verify that the NPP will be sent promptly to the patient/surrogate via [place] mail.  5. Capacity: I have reviewed this above verification and consent paragraph with the patient/ surrogate and the patient is capacitated or has a surrogate. If the patient is not capacitated to understand the above, and no surrogate is available, since this is not an emergency evaluation, the visit will be rescheduled until such time that the patient can consent, or the surrogate is available to consent. If this is an emergency evaluation and the patient is not capacitated to understand the above, and no surrogate is available, I am proceeding with this evaluation as this is felt to be an emergency setting and no appropriate specialist is available at the bedside to perform these evaluations.  6. Financial Waiver: I have personally verbally informed the patient/ surrogate that this evaluation will be a billable encounter similar to an in-person clinic visit, and the patient/ surrogate has agreed to pay the fee for services rendered. If we are billing insurance for the patient's telephone visit, her out-of-pocket cost will be determined based on her plan and will be billed to her. The patient/ surrogate has also been informed that if the patient does not have insurance or does not wish to use insurance,  [address] [employer] price for a primary care telehealth visit is $59.00 and specialist telehealth visit is $88.00. I have further informed the patient/ surrogate that in the event the patient has additional services provided in conjunction with the specialty visit (Ex. Psychotherapy services), those services will be billed at the current rate less a 45% discount.   7. Intra-State Location: The patient/ surrogate attests to understanding that if the patient accesses these services from a location outside of [place], that the patient does so at the  patient's own risk and initiative and that the patient is ultimately responsible for compliance with any laws or regulations associated with the patient's use.  8. Specific Use: The patient/ surrogate understands that [facility] makes no representation that materials or services delivered via telephone services, or listed on telemedicine websites, are appropriate or available for use in any other location.     Demographics:  Medical Record #: [id number]  Date: July 12, 2023  Patient Name: Jacqueline Fernandez  DOB: Nov 25, 1981  Age: 42 year old  Sex: female  Location: Home address on file  Patient seen Status: Patient was evaluated via telemedicine (audio or audio/video)     Evaluator(s):  Jacqueline Fernandez was evaluated by me today.   Clinic Location: [facility]  [place] [facility]  [address]  [place] [address]     The duration of this telephone visit was 10 minutes.      Hepatitis C Clinic Pharmacy Note:    HCV Genotype: lab pending  Baseline HCV viral load: 258000 (04/25/23)  Prior HCV treatment: Treatment nave  Prior HCV treatment response: N/A  Baseline RAVs: unknown   Fibrosis stage: pending fibroscan  AST 24 ALT 23 (07/07/23) PLT 185 (06/25/23)  APRI 0.324 FIB-4 1.14    Post-transplant: None  Co-infected: HIV    Potential drug interactions:  Epclusa - none  Mavyret - potential weak DDI w/ Biktarvy (no dose adjustment required)  [place] HEP: Bictegravir is a substrate of P-gp and concentrations may increase, but to a modest extent, as glecaprevir/pibrentasvir inhibits P-gp. Coadministration of bictegravir with darunavir/cobicistat (a strong CYP3A4 and P-gp inhibitor) increased bictegravir exposure by 74%. This increase is unlikely to be clinically significant; available dose exposure data, as well as data from phase 2 and phase 3 studies (48 weeks treatment), have shown a good safety profile with up to a 2.4 fold increase in bictegravir AUC. Studies performed with emtricitabine  or tenofovir alafenamide showed no clinically significant interactions with glecaprevir/pibrentasvir    Insurance coverage: [employer]  Specialty pharmacy: TBD    HCV treatment week: TBD (start date: TBD)  Current regimen: TBD      Outside Labs (07/07/23)                 Medication reconciliation was performed: Medication reconciliation was performed yes   Medication adherence: The patient reports missing no doses of their medication in the last weeks.  Medication adverse effects: The patient reports adverse effects to their medications no.     Serologies:  HAV Ab: -  HBV Core Ab: unknown  HBV SAg: unknown  HBV SAb: unknown  HIV Ab: + (10/07/14) per chart review    Assessment/Plan:  1. Hepatitis C: Pt with chronic HCV GT pending, [place]. Likely non-cirrhotic per APRI/FIB-4, pending Fibroscan on 08/02/23. Pt is good candidate for DAA therapy, recommend Epclusa 1 tab po daily x 12 weeks per pt preference for less # of tablets. Will route RX to [facility] to initiate auth process    The patient has a life expectancy of greater than 12 months referring to non-liver related conditions and is a good candidate for treatment    The patient is motivated to start treatment and has been counseled on the importance of adherence to HCV treatment and understands that non-adherence to treatment may result in virologic relapse or resistance to HCV medications    2. HAV: Non-Immune, HBV:  unknown, pending labs , HIV Ab: positive, VL  ND (07/07/23)    3. Recommended vaccinations: HAV and HBV pending serologies    4. Patient education: Educated patient on how to take Epclusa, importance of adherence, potential DDIs and management, potential ADRs and management, where to pick up medications and call for refills ahead of time, to bring medications with her should she be hospitalized to avoid breaks in therapy, provided treatment plan for patient to follow along with therapy, safe sex and safe needle practices to avoid reinfection  risk.  Patient verbalized understanding.      Annice Pih, PharmD, APh  Ambulatory Care Float Pharmacist  [facility]

## 2023-07-13 ENCOUNTER — Other Ambulatory Visit: Payer: Self-pay

## 2023-07-13 LAB — COMPREHENSIVE METABOLIC PANEL, BLOOD
ALT (SGPT): 23 U/L (ref 6–29)
AST (SGOT): 24 U/L (ref 10–30)
Albumin/Glob Ratio: 1.2 (calc) (ref 1.0–2.5)
Albumin: 4.2 g/dL (ref 3.6–5.1)
Alkaline Phos: 59 U/L (ref 31–125)
BUN/Creatinine Ratio: 13 (calc) (ref 6–22)
BUN: 13 mg/dL (ref 7–25)
Bilirubin, Total: 0.5 mg/dL (ref 0.2–1.2)
Calcium: 9.3 mg/dL (ref 8.6–10.2)
Carbon Dioxide: 24 mmol/L (ref 20–32)
Chloride: 107 mmol/L (ref 98–110)
Creatinine: 1.04 mg/dL — ABNORMAL HIGH (ref 0.50–0.99)
EGFR: 69 mL/min/{1.73_m2} (ref >=60)
Globulin: 3.4 g/dL (calc) (ref 1.9–3.7)
Glucose: 73 mg/dL (ref 65–139)
Potassium: 3.7 mmol/L (ref 3.5–5.3)
Sodium: 140 mmol/L (ref 135–146)
Total Protein: 7.6 g/dL (ref 6.1–8.1)

## 2023-07-13 LAB — HEPATITIS C GENOTYPING, BLOOD: Hcv Genotype, Lipa: 2

## 2023-07-13 LAB — HEPATITIS B SURFACE AB IMMUNITY, QN TO ENDPOINT: Hepatitis B Surface Ab Immunity, Qn to Endpoint: <5 m[IU]/mL — ABNORMAL LOW

## 2023-07-13 LAB — HIV-1 RNA QUANT/PLASMA
Hiv 1 Rna, Qn Pcr: NOT DETECTED Log copies/mL
Hiv 1 Rna, Qn Pcr: NOT DETECTED copies/mL

## 2023-07-13 LAB — HEPATITIS A IGM ANTIBODY, BLOOD: Hepatitis A IGM: NONREACTIVE

## 2023-07-13 LAB — MEASLES IGG ANTIBODY, BLOOD: Measles Ab (IgG): <13.5 AU/mL — ABNORMAL LOW

## 2023-07-14 ENCOUNTER — Other Ambulatory Visit: Payer: Self-pay

## 2023-07-14 ENCOUNTER — Encounter (HOSPITAL_BASED_OUTPATIENT_CLINIC_OR_DEPARTMENT_OTHER): Payer: Self-pay

## 2023-07-14 ENCOUNTER — Other Ambulatory Visit: Payer: Self-pay | Admitting: Family Medicine

## 2023-07-14 NOTE — Telephone Encounter (Signed)
LOV: 07/08/22  Last refill: 06/16/23 #30 w/ no refills  NOV: 07/25/23

## 2023-07-17 ENCOUNTER — Other Ambulatory Visit: Payer: Self-pay

## 2023-07-19 ENCOUNTER — Ambulatory Visit (INDEPENDENT_AMBULATORY_CARE_PROVIDER_SITE_OTHER): Payer: Medicaid Other | Admitting: Psychology

## 2023-07-19 ENCOUNTER — Other Ambulatory Visit (INDEPENDENT_AMBULATORY_CARE_PROVIDER_SITE_OTHER): Payer: Medicaid Other

## 2023-07-19 DIAGNOSIS — F411 Generalized anxiety disorder: Secondary | ICD-10-CM

## 2023-07-19 DIAGNOSIS — Z0000 Encounter for general adult medical examination without abnormal findings: Secondary | ICD-10-CM | POA: Diagnosis not present

## 2023-07-19 LAB — CBC WITH DIFFERENTIAL/PLATELET
Basophils Absolute: 0 10*3/uL (ref 0.0–0.1)
Basophils Relative: 0.5 % (ref 0.0–3.0)
Eosinophils Absolute: 0.2 10*3/uL (ref 0.0–0.7)
Eosinophils Relative: 1.8 % (ref 0.0–5.0)
HCT: 42.7 % (ref 36.0–46.0)
Hemoglobin: 14 g/dL (ref 12.0–15.0)
Lymphocytes Relative: 25.8 % (ref 12.0–46.0)
Lymphs Abs: 2.5 10*3/uL (ref 0.7–4.0)
MCHC: 32.9 g/dL (ref 30.0–36.0)
MCV: 92 fl (ref 78.0–100.0)
Monocytes Absolute: 0.7 10*3/uL (ref 0.1–1.0)
Monocytes Relative: 7.3 % (ref 3.0–12.0)
Neutro Abs: 6.4 10*3/uL (ref 1.4–7.7)
Neutrophils Relative %: 64.6 % (ref 43.0–77.0)
Platelets: 229 10*3/uL (ref 150.0–400.0)
RBC: 4.64 Mil/uL (ref 3.87–5.11)
RDW: 13.4 % (ref 11.5–15.5)
WBC: 9.9 10*3/uL (ref 4.0–10.5)

## 2023-07-19 LAB — COMPREHENSIVE METABOLIC PANEL
ALT: 12 U/L (ref 0–35)
AST: 13 U/L (ref 0–37)
Albumin: 4.3 g/dL (ref 3.5–5.2)
Alkaline Phosphatase: 50 U/L (ref 39–117)
BUN: 9 mg/dL (ref 6–23)
CO2: 27 mEq/L (ref 19–32)
Calcium: 9.1 mg/dL (ref 8.4–10.5)
Chloride: 102 mEq/L (ref 96–112)
Creatinine, Ser: 0.79 mg/dL (ref 0.40–1.20)
GFR: 92.39 mL/min (ref >60.00)
Glucose, Bld: 82 mg/dL (ref 70–99)
Potassium: 3.8 mEq/L (ref 3.5–5.1)
Sodium: 135 mEq/L (ref 135–145)
Total Bilirubin: 0.7 mg/dL (ref 0.2–1.2)
Total Protein: 6.7 g/dL (ref 6.0–8.3)

## 2023-07-19 LAB — LIPID PANEL
Cholesterol: 197 mg/dL (ref 0–200)
HDL: 37.2 mg/dL — ABNORMAL LOW (ref >39.00)
LDL Cholesterol: 142 mg/dL — ABNORMAL HIGH (ref 0–99)
NonHDL: 159.48
Total CHOL/HDL Ratio: 5
Triglycerides: 86 mg/dL (ref 0.0–149.0)
VLDL: 17.2 mg/dL (ref 0.0–40.0)

## 2023-07-19 LAB — TSH: TSH: 1.46 u[IU]/mL (ref 0.35–5.50)

## 2023-07-19 NOTE — Progress Notes (Signed)
[facility] Counselor/Therapist Progress Note  Patient ID: Christine Rice, MRN: [id number]   Date: 07/19/23  Time Spent: 3:01 pm - 3:52 pm : 51 Minutes  Treatment Type: Individual Therapy.  Reported Symptoms: depression and anxiety.   Mental Status Exam: Appearance:  Well Groomed     Behavior: Appropriate  Motor: Normal  Speech/Language:  Normal Rate  Affect: Appropriate  Mood: anxious  Thought process: normal  Thought content:   WNL  Sensory/Perceptual disturbances:   WNL  Orientation: oriented to person, place, time/date, and situation  Attention: Good  Concentration: Good  Memory: WNL  Fund of knowledge:  Good  Insight:   Good  Judgment:  Good  Impulse Control: Good   Risk Assessment: Danger to Self:  No Self-injurious Behavior: No Danger to Others: No Duty to Warn:no Physical Aggression / Violence:No  Access to Firearms a concern: No  Gang Involvement:No   Subjective:   ARITHA MOSTYN participated from home, via video, is aware of the limitations of tele-sessions, and consented to treatment. Therapist participated from home office. Luella reviewed the events of the past week. She noted a need to discuss her own needs and concerns with her partner but noted not having an opportunity to do so. She noted worry that their needs would not align. Therapist highlighted Johni's vacillating stance regarding her own needs. We worked on processing this during the session and how she would like to proceed and ways to identify and solidify her needs in the relationship. We discussed the importance of communicating and compromising. She noted worry regarding her daughter's decision making. We worked on identifying ways to communicating needs and boundaries. We processed the adjustment of parenting an adult and the difficulty setting boundaries at that age and we worked on identifying ways to communicate to her daughter. Therapist validated and normalized Marily's  feelings and experience and provided supportive therapy. A follow-up was scheduled for continued treatment.    Interventions: interpersonal & CBT  Diagnosis:   Generalized anxiety disorder  Psychiatric Treatment: Yes , via PCP. See chart.    Treatment Plan:  Client Abilities/Strengths Delara is forthcoming, self-aware, and motivated for change.   Support System: Boyfriend, friends.   Client Treatment Preferences Outpatient Therapy.   Client Statement of Needs Oluwaseun would like to process parenting stressors, reduce negative self-talk,  self-care, managing mood proactively, increasing frustration tolerance, managing frustration, and engaging socially.   Treatment Level Weekly  Symptoms  Anxiety: anxious, irritability, difficulty managing worry, low frustration tolerance, difficulty leaving the home, social anxiety.    (Status: maintained) Depression: poor sleep, lethargy.    (Status: maintained)  Goals:   Anegla experiences symptoms of depression and anxiety.    Target Date: 01/20/24 Frequency: Weekly  Progress: 0 Modality: individual    Therapist will provide referrals for additional resources as appropriate.  Therapist will provide psycho-education regarding Lula's diagnosis and corresponding treatment approaches and interventions. Licensed Clinical Social Worker, [place], LCSW will support the patient's ability to achieve the goals identified. will employ CBT, BA, Problem-solving, Solution Focused, Mindfulness,  coping skills, & other evidenced-based practices will be used to promote progress towards healthy functioning to help manage decrease symptoms associated with her diagnosis.   Reduce overall level, frequency, and intensity of the feelings of depression and anxiety as evidenced by decreased  from 6 to 7 days/week to 0 to 1 days/week per client report for at least 3 consecutive months. Verbally express understanding of the relationship between  feelings of depression,  anxiety and their impact on thinking patterns and behaviors. Verbalize an understanding of the role that distorted thinking plays in creating fears, excessive worry, and ruminations.    [name] participated in the creation of the treatment plan)    [name], LCSW

## 2023-07-21 ENCOUNTER — Other Ambulatory Visit: Payer: Self-pay

## 2023-07-25 ENCOUNTER — Ambulatory Visit (INDEPENDENT_AMBULATORY_CARE_PROVIDER_SITE_OTHER): Payer: Medicaid Other | Admitting: Family Medicine

## 2023-07-25 ENCOUNTER — Encounter: Payer: Self-pay | Admitting: Family Medicine

## 2023-07-25 VITALS — BP 128/84 | HR 75 | Temp 97.7°F | Ht 65.25 in | Wt 184.1 lb

## 2023-07-25 DIAGNOSIS — E7800 Pure hypercholesterolemia, unspecified: Secondary | ICD-10-CM | POA: Diagnosis not present

## 2023-07-25 DIAGNOSIS — M2550 Pain in unspecified joint: Secondary | ICD-10-CM

## 2023-07-25 DIAGNOSIS — E785 Hyperlipidemia, unspecified: Secondary | ICD-10-CM | POA: Insufficient documentation

## 2023-07-25 DIAGNOSIS — F411 Generalized anxiety disorder: Secondary | ICD-10-CM | POA: Diagnosis not present

## 2023-07-25 DIAGNOSIS — M7918 Myalgia, other site: Secondary | ICD-10-CM | POA: Diagnosis not present

## 2023-07-25 DIAGNOSIS — Z6830 Body mass index (BMI) 30.0-30.9, adult: Secondary | ICD-10-CM | POA: Diagnosis not present

## 2023-07-25 DIAGNOSIS — Z0000 Encounter for general adult medical examination without abnormal findings: Secondary | ICD-10-CM | POA: Diagnosis not present

## 2023-07-25 DIAGNOSIS — N050 Unspecified nephritic syndrome with minor glomerular abnormality: Secondary | ICD-10-CM | POA: Diagnosis not present

## 2023-07-25 NOTE — Progress Notes (Signed)
Subjective:    Patient ID: Christine Rice, female    DOB: August 27, 1981, 42 y.o.   MRN: [id number]  HPI  Here for health maintenance exam and to review chronic medical problems   Wt Readings from Last 3 Encounters:  07/25/23 184 lb 2 oz (83.5 kg)  11/10/22 179 lb 2 oz (81.3 kg)  07/08/22 173 lb (78.5 kg)   30.41 kg/m  Vitals:   07/25/23 0901  BP: 128/84  Pulse: 75  Temp: 97.7 F (36.5 C)  SpO2: 97%   By her scale has lost 3-4 lb  Wants to loose to 165 lb   Immunization History  Administered Date(s) Administered   Influenza Whole 11/03/2010   Influenza,inj,Quad PF,6+ Mos 12/02/2013   Pneumococcal Polysaccharide-23 07/14/2014   Td 03/22/1999   Tdap 07/14/2014    Health Maintenance Due  Topic Date Due   PAP SMEAR-Modifier  08/19/2023   Doing pretty good overall  Staying busy  Work and kids are good  (teens)  Is planning to sell her old house and buy something newer and smaller   Really struggling with her weight  Trying to eat healthier  Less eating out    Flu shot - does not get   Mammogram- had 02/2023 (call back was normal) Self breast exam-no lumps   Gyn health Pap 08/2020 normal with neg HPV with gyn  Has mirena iud    Colon cancer screening -to start at 45 No family history    Bone health   Falls- none  Fractures-none  Supplements - takes women's mvi daily (some glucosamine and other supplements)  Exercise -some walking    Mood    12/26/2022   10:27 AM 07/08/2022    9:31 AM 06/08/2022    2:06 PM 02/08/2022   10:20 AM 07/01/2021    9:58 AM  Depression screen PHQ 2/9  Decreased Interest  0 1 0 1  Down, Depressed, Hopeless  0 1 1 1   PHQ - 2 Score  0 2 1 2   Altered sleeping  0 0 2 1  Tired, decreased energy  0 3 2 2   Change in appetite  0 0 0 0  Feeling bad or failure about yourself   0 1 2 1   Trouble concentrating  1 0 2 3  Moving slowly or fidgety/restless  1 2 0   Suicidal thoughts  0 0 0 0  PHQ-9 Score  2 8 9 9   Difficult doing  work/chores   Somewhat difficult Somewhat difficult Very difficult     Information is confidential and restricted. Go to Review Flowsheets to unlock data.   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 -was able to stop this due to good blood pressure   Sees Dr Thedore Mins yearly   Lab Results  Component Value Date   NA 135 07/19/2023   K 3.8 07/19/2023   CO2 27 07/19/2023   GLUCOSE 82 07/19/2023   BUN 9 07/19/2023   CREATININE 0.79 07/19/2023   CALCIUM 9.1 07/19/2023   GFR 92.39 07/19/2023   GFRNONAA >60 01/16/2020   Lab Results  Component Value Date   WBC 9.9 07/19/2023   HGB 14.0 07/19/2023   HCT  42.7 07/19/2023   MCV 92.0 07/19/2023   PLT 229.0 07/19/2023    Cholesterol Lab Results  Component Value Date   CHOL 197 07/19/2023   CHOL 154 08/13/2020   CHOL 349 (H) 01/16/2020   Lab Results  Component Value Date   HDL 37.20 (L) 07/19/2023   HDL 37.00 (L) 08/13/2020   HDL 49 01/16/2020   Lab Results  Component Value Date   LDLCALC 142 (H) 07/19/2023   LDLCALC 103 (H) 08/13/2020   LDLCALC 261 (H) 01/16/2020   Lab Results  Component Value Date   TRIG 86.0 07/19/2023   TRIG 66.0 08/13/2020   TRIG 194 (H) 01/16/2020   Lab Results  Component Value Date   CHOLHDL 5 07/19/2023   CHOLHDL 4 08/13/2020   CHOLHDL 7.1 01/16/2020   No results found for: "LDLDIRECT" Takes glucosamine   On lipitor briefly and she did not tolerated it   Gets dizzy sometimes when she turns her head quickly   Eats breakfast meats   No longer vapinng    Patient Active Problem List   Diagnosis Date Noted   BMI 30.0-30.9,adult 07/25/2023   Hyperlipidemia 07/25/2023   Lump of skin of right lower  extremity 02/08/2022   Myofascial pain dysfunction syndrome 10/26/2021   Dizziness 10/15/2021   Joint pain 08/26/2021   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   Chronic headaches 01/10/2020   H/O hernia repair 06/10/2019   History of smoking 10-25 pack years 07/14/2014   Routine general medical examination at a health care facility 07/07/2014   IBS 08/15/2007   History of HPV infection 08/14/2007   Anxiety disorder 08/14/2007   DEPRESSION 08/14/2007   ADD 08/14/2007   Past Medical History:  Diagnosis Date   Anxiety    Kidney failure    Nephrotic syndrome    Past Surgical History:  Procedure Laterality Date   CESAREAN SECTION  01/06/2005   CESAREAN SECTION  01/19/2012   Procedure: CESAREAN SECTION;  Surgeon: Turner Daniels, MD;  Location: [facility];  Service: Gynecology;  Laterality: N/A;  repeat   HERNIA REPAIR     INGUINAL HERNIA REPAIR  01/19/2012   Procedure: HERNIA REPAIR INGUINAL ADULT BILATERAL;  Surgeon: Ardeth Sportsman, MD;  Location: [facility];  Service: General;  Laterality: Bilateral;   RENAL BIOPSY, PERCUTANEOUS  01/06/2020       tummy tuck     UMBILICAL HERNIA REPAIR  01/19/2012   Procedure: HERNIA REPAIR UMBILICAL ADULT;  Surgeon: Ardeth Sportsman, MD;  Location: [facility];  Service: General;  Laterality: N/A;   UMBILICAL HERNIA REPAIR  11/19/2014   WISDOM TOOTH EXTRACTION     Social History   Tobacco Use   Smoking status: Former    Current packs/day: 0.00    Types: Cigarettes    Quit date: 11/15/2019    Years since quitting: 3.6   Smokeless tobacco: Never  Vaping Use   Vaping status: Former  Substance Use Topics   Alcohol use: Not Currently   Drug use: Yes    Types: Marijuana    Comment: occ- marijuana    Family History  Problem Relation Age of Onset   Rashes / Skin problems Mother    COPD Father    Stroke Father    Healthy Daughter    Healthy Daughter    Breast cancer Neg Hx    Cancer Neg Hx    Allergies  Allergen Reactions   Amoxicillin Hives     REACTION: rash  No associated shortness of  breath or throat swelling   Penicillins Hives   Lipitor [Atorvastatin]     Aches and pains and fatigue   Current Outpatient Medications on File Prior to Visit  Medication Sig Dispense Refill   ALPRAZolam (XANAX) 0.25 MG tablet TAKE ONE TABLET (0.25 MG TOTAL) BY MOUTH DAILY AS NEEDED FOR ANXIETY 30 tablet 2   ASPIRIN 81 PO Take 81 mg by mouth daily as needed.     DULoxetine (CYMBALTA) 60 MG capsule Take 1 capsule (60 mg total) by mouth daily. 90 capsule 2   hydrOXYzine (VISTARIL) 25 MG capsule TAKE 1 CAPSULE BY MOUTH EVERY 8 HOURS ASNEEDED 90 capsule 3   levonorgestrel (MIRENA) 20 MCG/24HR IUD 1 each by Intrauterine route once.     Multiple Vitamin (MULTIVITAMIN) tablet Take 1 tablet by mouth daily.     No current facility-administered medications on file prior to visit.    Review of Systems  Constitutional:  Negative for activity change, appetite change, fatigue, fever and unexpected weight change.  HENT:  Negative for congestion, ear pain, rhinorrhea, sinus pressure and sore throat.   Eyes:  Negative for pain, redness and visual disturbance.  Respiratory:  Negative for cough, shortness of breath and wheezing.   Cardiovascular:  Negative for chest pain and palpitations.  Gastrointestinal:  Negative for abdominal pain, blood in stool, constipation and diarrhea.  Endocrine: Negative for polydipsia and polyuria.  Genitourinary:  Negative for dysuria, frequency and urgency.  Musculoskeletal:  Negative for arthralgias, back pain and myalgias.  Skin:  Negative for pallor and rash.  Allergic/Immunologic: Negative for environmental allergies.  Neurological:  Negative for dizziness, syncope and headaches.       Occational positional dizziness   Hematological:  Negative for adenopathy. Does not bruise/bleed easily.  Psychiatric/Behavioral:  Negative for decreased concentration and dysphoric mood. The patient is nervous/anxious.        Objective:    Physical Exam Constitutional:      General: She is not in acute distress.    Appearance: Normal appearance. She is well-developed. She is obese. She is not ill-appearing or diaphoretic.  HENT:     Head: Normocephalic and atraumatic.     Right Ear: Tympanic membrane, ear canal and external ear normal.     Left Ear: Tympanic membrane, ear canal and external ear normal.     Nose: Nose normal. No congestion.     Mouth/Throat:     Mouth: Mucous membranes are moist.     Pharynx: Oropharynx is clear. No posterior oropharyngeal erythema.  Eyes:     General: No scleral icterus.    Extraocular Movements: Extraocular movements intact.     Conjunctiva/sclera: Conjunctivae normal.     Pupils: Pupils are equal, round, and reactive to light.  Neck:     Thyroid: No thyromegaly.     Vascular: No carotid bruit or JVD.  Cardiovascular:     Rate and Rhythm: Normal rate and regular rhythm.     Pulses: Normal pulses.     Heart sounds: Normal heart sounds.     No gallop.  Pulmonary:     Effort: Pulmonary effort is normal. No respiratory distress.     Breath sounds: Normal breath sounds. No wheezing.     Comments: Good air exch Chest:     Chest wall: No tenderness.  Abdominal:     General: Bowel sounds are normal. There is no distension or abdominal bruit.     Palpations: Abdomen is soft. There is no mass.  Tenderness: There is no abdominal tenderness.     Hernia: No hernia is present.  Genitourinary:    Comments: Breast and pelvic exam are done by gyn provider   Musculoskeletal:        General: No tenderness. Normal range of motion.     Cervical back: Normal range of motion and neck supple. No rigidity. No muscular tenderness.     Right lower leg: No edema.     Left lower leg: No edema.     Comments: No kyphosis   Lymphadenopathy:     Cervical: No cervical adenopathy.  Skin:    General: Skin is warm and dry.     Coloration: Skin is not pale.     Findings: No erythema or rash.      Comments: Solar lentigines diffusely   Neurological:     Mental Status: She is alert. Mental status is at baseline.     Cranial Nerves: No cranial nerve deficit.     Motor: No abnormal muscle tone.     Coordination: Coordination normal.     Gait: Gait normal.     Deep Tendon Reflexes: Reflexes are normal and symmetric. Reflexes normal.  Psychiatric:        Mood and Affect: Mood normal.        Cognition and Memory: Cognition and memory normal.           Assessment & Plan:   Problem List Items Addressed This Visit       Musculoskeletal and Integument   Myofascial pain dysfunction syndrome    Doing much better with cymbalta Encouraged low impact exercise and strength training as tolerated         Genitourinary   Minimal change disease    Much improved Off arb Under care of nephrology        Other   Routine general medical examination at a health care facility - Primary    Reviewed health habits including diet and exercise and skin cancer prevention Reviewed appropriate screening tests for age  Also reviewed health mt list, fam hx and immunization status , as well as social and family history   See HPI Labs reviewed and ordered Sees gyn  Mammogram utd 02/2023 and pap 2021 (has mirena iud)  Will discuss colon cancer screening at 45 Discussed bone health and need for D3 and exercise  Congratulated on not smoking or vaping  Getting mental health care Discussed low glycemic diet and exercise for weight loss       Joint pain    Improved       Hyperlipidemia    Disc goals for lipids and reasons to control them Rev last labs with pt Rev low sat fat diet in detail LDL up to 142 Watching  Will work on diet  Intol of atorvastatin in past   Re check fasting in 3 mo       BMI 30.0-30.9,adult    Discussed how this problem influences overall health and the risks it imposes  Reviewed plan for weight loss with lower calorie diet (via better food choices (lower  glycemic and portion control) along with exercise building up to or more than 30 minutes 5 days per week including some aerobic activity and strength training    Given info on mediterranean diet to read as well      Anxiety disorder    Continues mental health therapy  Hydroxyzine prn / xanax prn and cymbalta  Doing ok overall  Encouraged good  self care and exercise

## 2023-07-25 NOTE — Patient Instructions (Addendum)
Try and exercise regularly  Add some strength training to your routine, this is important for bone and brain health and can reduce your risk of falls and help your body use insulin properly and regulate weight  Light weights, exercise bands , and internet videos are a good way to start  Yoga (chair or regular), machines , floor exercises or a gym with machines are also good options    Try to get most of your carbohydrates from produce (with the exception of white potatoes) and whole grains Eat less bread/pasta/rice/snack foods/cereals/sweets and other items from the middle of the grocery store (processed carbs)   I recommend a flu shot in the fall   If you have a balanced diet - you need vitamin D3   2000 international units daily   For now - hold the glucosamine   For cholesterol Avoid red meat/ fried foods/ egg yolks/ fatty breakfast meats/ butter, cheese and high fat dairy/ and shellfish    Let's check cholesterol again in 3 months        Mediterranean Diet  Why follow it? Research shows. Those who follow the Mediterranean diet have a reduced risk of heart disease  The diet is associated with a reduced incidence of Parkinson's and Alzheimer's diseases People following the diet may have longer life expectancies and lower rates of chronic diseases  The Dietary Guidelines for Americans recommends the Mediterranean diet as an eating plan to promote health and prevent disease  What Is the Mediterranean Diet?  Healthy eating plan based on typical foods and recipes of Mediterranean-style cooking The diet is primarily a plant based diet; these foods should make up a majority of meals   Starches - Plant based foods should make up a majority of meals - They are an important sources of vitamins, minerals, energy, antioxidants, and fiber - Choose whole grains, foods high in fiber and minimally processed items  - Typical grain sources include wheat, oats, barley, corn, brown rice, bulgar,  farro, millet, polenta, couscous  - Various types of beans include chickpeas, lentils, fava beans, Schnetzer beans, white beans   Fruits  Veggies - Large quantities of antioxidant rich fruits & veggies; 6 or more servings  - Vegetables can be eaten raw or lightly drizzled with oil and cooked  - Vegetables common to the traditional Mediterranean Diet include: artichokes, arugula, beets, broccoli, brussel sprouts, cabbage, carrots, celery, collard greens, cucumbers, eggplant, kale, leeks, lemons, lettuce, mushrooms, okra, onions, peas, peppers, potatoes, pumpkin, radishes, rutabaga, shallots, spinach, sweet potatoes, turnips, zucchini - Fruits common to the Mediterranean Diet include: apples, apricots, avocados, cherries, clementines, dates, figs, grapefruits, grapes, melons, nectarines, oranges, peaches, pears, pomegranates, strawberries, tangerines  Fats - Replace butter and margarine with healthy oils, such as olive oil, canola oil, and tahini  - Limit nuts to no more than a handful a day  - Nuts include walnuts, almonds, pecans, pistachios, pine nuts  - Limit or avoid candied, honey roasted or heavily salted nuts - Olives are central to the [employer] - can be eaten whole or used in a variety of dishes   Meats Protein - Limiting red meat: no more than a few times a month - When eating red meat: choose lean cuts and keep the portion to the size of deck of cards - Eggs: approx. 0 to 4 times a week  - Fish and lean poultry: at least 2 a week  - Healthy protein sources include, chicken, [place], lean beef, lamb - Increase  intake of seafood such as tuna, salmon, trout, mackerel, shrimp, scallops - Avoid or limit high fat processed meats such as sausage and bacon  Dairy - Include moderate amounts of low fat dairy products  - Focus on healthy dairy such as fat free yogurt, skim milk, low or reduced fat cheese - Limit dairy products higher in fat such as whole or 2% milk, cheese, ice cream  Alcohol  - Moderate amounts of red wine is ok  - No more than 5 oz daily for women (all ages) and men older than age 82  - No more than 10 oz of wine daily for men younger than 60  Other - Limit sweets and other desserts  - Use herbs and spices instead of salt to flavor foods  - Herbs and spices common to the traditional Mediterranean Diet include: basil, bay leaves, chives, cloves, cumin, fennel, garlic, lavender, marjoram, mint, oregano, parsley, pepper, rosemary, sage, savory, sumac, tarragon, thyme   It's not just a diet, it's a lifestyle:  The Mediterranean diet includes lifestyle factors typical of those in the region  Foods, drinks and meals are best eaten with others and savored Daily physical activity is important for overall good health This could be strenuous exercise like running and aerobics This could also be more leisurely activities such as walking, housework, yard-work, or taking the stairs Moderation is the key; a balanced and healthy diet accommodates most foods and drinks Consider portion sizes and frequency of consumption of certain foods   Meal Ideas & Options:  Breakfast:  Whole wheat toast or whole wheat English muffins with peanut butter & hard boiled egg Steel cut oats topped with apples & cinnamon and skim milk  Fresh fruit: banana, strawberries, melon, berries, peaches  Smoothies: strawberries, bananas, greek yogurt, peanut butter Low fat greek yogurt with blueberries and granola  Egg white omelet with spinach and mushrooms Breakfast couscous: whole wheat couscous, apricots, skim milk, cranberries  Sandwiches:  Hummus and grilled vegetables (peppers, zucchini, squash) on whole wheat bread   Grilled chicken on whole wheat pita with lettuce, tomatoes, cucumbers or tzatziki  [place] salad on whole wheat bread: tuna salad made with greek yogurt, olives, red peppers, capers, green onions Garlic rosemary lamb pita: lamb sauted with garlic, rosemary, salt & pepper; add lettuce,  cucumber, greek yogurt to pita - flavor with lemon juice and [place] pepper  Seafood:  Mediterranean grilled salmon, seasoned with garlic, basil, parsley, lemon juice and [place] pepper Shrimp, lemon, and spinach whole-grain pasta salad made with low fat greek yogurt  Seared scallops with lemon orzo  Seared tuna steaks seasoned salt, pepper, coriander topped with tomato mixture of olives, tomatoes, olive oil, minced garlic, parsley, green onions and cappers  Meats:  Herbed greek chicken salad with kalamata olives, cucumber, feta  Red bell peppers stuffed with spinach, bulgur, lean ground beef (or lentils) & topped with feta   Kebabs: skewers of chicken, tomatoes, onions, zucchini, squash  [place] burgers: made with red onions, mint, dill, lemon juice, feta cheese topped with roasted red peppers Vegetarian Cucumber salad: cucumbers, artichoke hearts, celery, red onion, feta cheese, tossed in olive oil & lemon juice  Hummus and whole grain pita points with a greek salad (lettuce, tomato, feta, olives, cucumbers, red onion) Lentil soup with celery, carrots made with vegetable broth, garlic, salt and pepper  Tabouli salad: parsley, bulgur, mint, scallions, cucumbers, tomato, radishes, lemon juice, olive oil, salt and pepper.

## 2023-07-25 NOTE — Assessment & Plan Note (Signed)
Much improved Off arb Under care of nephrology

## 2023-07-25 NOTE — Assessment & Plan Note (Signed)
Disc goals for lipids and reasons to control them Rev last labs with pt Rev low sat fat diet in detail LDL up to 142 Watching  Will work on diet  Intol of atorvastatin in past   Re check fasting in 3 mo

## 2023-07-25 NOTE — Assessment & Plan Note (Signed)
Reviewed health habits including diet and exercise and skin cancer prevention Reviewed appropriate screening tests for age  Also reviewed health mt list, fam hx and immunization status , as well as social and family history   See HPI Labs reviewed and ordered Sees gyn  Mammogram utd 02/2023 and pap 2021 (has mirena iud)  Will discuss colon cancer screening at 45 Discussed bone health and need for D3 and exercise  Congratulated on not smoking or vaping  Getting mental health care Discussed low glycemic diet and exercise for weight loss

## 2023-07-25 NOTE — Assessment & Plan Note (Signed)
Doing much better with cymbalta Encouraged low impact exercise and strength training as tolerated

## 2023-07-25 NOTE — Assessment & Plan Note (Signed)
Discussed how this problem influences overall health and the risks it imposes  Reviewed plan for weight loss with lower calorie diet (via better food choices (lower glycemic and portion control) along with exercise building up to or more than 30 minutes 5 days per week including some aerobic activity and strength training    Given info on mediterranean diet to read as well

## 2023-07-25 NOTE — Assessment & Plan Note (Signed)
Improved

## 2023-07-25 NOTE — Assessment & Plan Note (Signed)
Continues mental health therapy  Hydroxyzine prn / xanax prn and cymbalta  Doing ok overall  Encouraged good self care and exercise

## 2023-07-31 ENCOUNTER — Other Ambulatory Visit: Payer: Self-pay

## 2023-08-01 ENCOUNTER — Ambulatory Visit (INDEPENDENT_AMBULATORY_CARE_PROVIDER_SITE_OTHER): Payer: Medicaid Other | Admitting: Psychology

## 2023-08-01 DIAGNOSIS — F411 Generalized anxiety disorder: Secondary | ICD-10-CM

## 2023-08-01 NOTE — Progress Notes (Signed)
[facility] Counselor/Therapist Progress Note  Patient ID: Christine Rice, MRN: [id number]   Date: 08/01/23  Time Spent: 9:05 am - 9:52am :47 Minutes  Treatment Type: Individual Therapy.  Reported Symptoms: depression and anxiety.   Mental Status Exam: Appearance:  Well Groomed     Behavior: Appropriate  Motor: Normal  Speech/Language:  Normal Rate  Affect: Appropriate  Mood: anxious  Thought process: normal  Thought content:   WNL  Sensory/Perceptual disturbances:   WNL  Orientation: oriented to person, place, time/date, and situation  Attention: Good  Concentration: Good  Memory: WNL  Fund of knowledge:  Good  Insight:   Good  Judgment:  Good  Impulse Control: Good   Risk Assessment: Danger to Self:  No Self-injurious Behavior: No Danger to Others: No Duty to Warn:no Physical Aggression / Violence:No  Access to Firearms a concern: No  Gang Involvement:No   Subjective:   Christine Rice participated from home, via video, is aware of the limitations of tele-sessions, and consented to treatment. Therapist participated from office. Christine Rice reviewed the events of the past week. She noted having a recent check-up with her PCP and noted a need to address her cholesterol and need to make behavioral changes. She noted varying differences between  her and her significant other and noted this being a barrier to forward traction. We worked on identifying her needs in the relationship and her attempts to communicate her needs and compromise. We worked on identifying ways to communicate needs, provide empathy, and maintain sense of self. Therapist encouraged Christine Rice to identify her needs in regards to living arrangements, relationship needs, and parenting approaches and to ask her significant other for the same information as a vehicle for communication and compromise. Therapist validated and normalized Christine Rice feelings and experience and provided supportive therapy. A  follow-up was scheduled for continued treatment which she benefits from.   Interventions: interpersonal & CBT  Diagnosis:   Generalized anxiety disorder  Psychiatric Treatment: Yes , via PCP. See chart.    Treatment Plan:  Client Abilities/Strengths Christine Rice is forthcoming, self-aware, and motivated for change.   Support System: Boyfriend, friends.   Client Treatment Preferences Outpatient Therapy.   Client Statement of Needs Christine Rice would like to process parenting stressors, reduce negative self-talk,  self-care, managing mood proactively, increasing frustration tolerance, managing frustration, and engaging socially.   Treatment Level Weekly  Symptoms  Anxiety: anxious, irritability, difficulty managing worry, low frustration tolerance, difficulty leaving the home, social anxiety.    (Status: maintained) Depression: poor sleep, lethargy.    (Status: maintained)  Goals:   Christine Rice experiences symptoms of depression and anxiety.    Target Date: 01/20/24 Frequency: Weekly  Progress: 0 Modality: individual    Therapist will provide referrals for additional resources as appropriate.  Therapist will provide psycho-education regarding Christine Rice's diagnosis and corresponding treatment approaches and interventions. Licensed Clinical Social Worker, [place], LCSW will support the patient's ability to achieve the goals identified. will employ CBT, BA, Problem-solving, Solution Focused, Mindfulness,  coping skills, & other evidenced-based practices will be used to promote progress towards healthy functioning to help manage decrease symptoms associated with her diagnosis.   Reduce overall level, frequency, and intensity of the feelings of depression and anxiety as evidenced by decreased  from 6 to 7 days/week to 0 to 1 days/week per client report for at least 3 consecutive months. Verbally express understanding of the relationship between feelings of depression, anxiety and their  impact on thinking patterns and behaviors. Verbalize  an understanding of the role that distorted thinking plays in creating fears, excessive worry, and ruminations.    Christine Rice participated in the creation of the treatment plan)    Christine Ovens, LCSW

## 2023-08-02 ENCOUNTER — Ambulatory Visit: Payer: Medicaid Other | Attending: Family

## 2023-08-02 DIAGNOSIS — B1920 Unspecified viral hepatitis C without hepatic coma: Secondary | ICD-10-CM | POA: Insufficient documentation

## 2023-08-02 NOTE — Interdisciplinary (Signed)
DATE 08/02/2023 1:07 PM Jacqueline Fernandez       Name, MRN [id number] Jacqueline Fernandez        FIBROSCAN 502     Probe: XL     Median Stiffness: 6.7  Interquartile stiffness range/med: 16%  Number of Valid Measurements: 10  Total Number of Measurements: 12      Fasting: yes                                            Alcohol: yes had a sip of alcohol at 6am 4hrs before test        Procedure Performed by: Jacqueline Fernandez      Assessment of liver fibrosis:  The gold standard for assessing liver fibrosis is liver biopsy.  However in 03-2013 the FDA approved Fibroscan to assess fibrosis in a noninvasive manner.  Fibroscan has been shown to assess fibrosis in patients with various liver diseases and correlates well with liver biopsy.  Fibroscan does not assess inflammation of the liver nor the cause of the liver fibrosis. The first step is assessing fibrosis will be a Fibroscan. If further assessment of cause of liver disease, confirmation of degree of fibrosis or amount of inflammation is required then a liver biopsy will be needed.    Below Completed by Provider  Indication (underlying disease/condition): Chronic Hepatitis C    Interpretation: This scan meets quality criteria and is consistent with no significant fibrosis.      Interpretation performed by: Deirdre Priest, MSN, FNP-BC      Fibroscan results can be affected by non-fasting state (fast 3 hours), high ALT (>100 U/mL), hepatic congestion, obesity, alcohol, and cholestasis.     Fibroscan Rationale:  The Food and Drug Administration (FDA) has approved market clearance for Fibroscan, a novel medical device that measures liver stiffness. Vibration controlled elastography with Fibroscan uses a modified ultrasound probe to measure the velocity of a shear wave created by vibratory source. Establishing the presence of fibrosis or cirrhosis in patients with chronic liver disease is important for assessment of prognosis and for evidence of progressive disease (fibrosis). The diagnosis of  cirrhosis is important to initiate screening for varices and liver cancer. Fibroscan can be used as a noninvasive alternative to a liver biopsy.    Procedure:  After verbal explanation, the patient was placed in the supine position with right hand above head. Pre-measurement data confirmed the transient elastography probe was centered over the liver parenchyma. 2D image and elastrogram were evaluated for good transmission and positioning. A series of ten 50Hz  mechanical pulses were applied with controlled application pressure to induce a mechanical shear wave in the liver tissue. For each measurement, the shear wave propagation speed was detected, displayed and converted to its equivalent liver stiffness value in kilopascals. Skin to liver capsule distance and shear wave characteristics were monitored during the entire examination to assure data quality. Median liver stiffness measurement and interquartile range were calculated and displayed in real time. Acquired measurement data was stored and submitted to the physician for review and interpretation.  Patient tolerated the procedure well and was discharged without incident.

## 2023-08-07 ENCOUNTER — Encounter: Payer: Self-pay | Admitting: Family Medicine

## 2023-08-09 ENCOUNTER — Telehealth (HOSPITAL_BASED_OUTPATIENT_CLINIC_OR_DEPARTMENT_OTHER): Payer: Self-pay | Admitting: Pharmacist

## 2023-08-09 DIAGNOSIS — B1920 Unspecified viral hepatitis C without hepatic coma: Secondary | ICD-10-CM

## 2023-08-09 NOTE — Telephone Encounter (Signed)
Attempted to contact patient regarding [employer] education. Call would not connect.    MyChart message sent.    Lauro Franklin, PharmD, BCACP

## 2023-08-10 ENCOUNTER — Ambulatory Visit: Payer: Medicaid Other | Admitting: Dermatology

## 2023-08-10 ENCOUNTER — Encounter: Payer: Self-pay | Admitting: Dermatology

## 2023-08-10 DIAGNOSIS — L821 Other seborrheic keratosis: Secondary | ICD-10-CM

## 2023-08-10 DIAGNOSIS — D229 Melanocytic nevi, unspecified: Secondary | ICD-10-CM | POA: Diagnosis not present

## 2023-08-10 DIAGNOSIS — D485 Neoplasm of uncertain behavior of skin: Secondary | ICD-10-CM

## 2023-08-10 DIAGNOSIS — L814 Other melanin hyperpigmentation: Secondary | ICD-10-CM

## 2023-08-10 DIAGNOSIS — L578 Other skin changes due to chronic exposure to nonionizing radiation: Secondary | ICD-10-CM

## 2023-08-10 NOTE — Patient Instructions (Signed)

## 2023-08-10 NOTE — Progress Notes (Signed)
   Follow-Up Visit   Subjective  Christine Rice is a 42 y.o. female who presents for the following: dark spots at face, one spot at left temple was scabbed but has since resolved. No personal or fhx skin cancer.   The patient has spots, moles and lesions to be evaluated, some may be new or changing and the patient may have concern these could be cancer.   The following portions of the chart were reviewed this encounter and updated as appropriate: medications, allergies, medical history  Review of Systems:  No other skin or systemic complaints except as noted in HPI or Assessment and Plan.  Objective  Well appearing patient in no apparent distress; mood and affect are within normal limits.   A focused examination was performed of the following areas: Face, shoulders  Relevant exam findings are noted in the Assessment and Plan.  Left Shoulder - Anterior 4 mm pink papule with single venuloectasia on left anterior shoulder       Assessment & Plan   ACTINIC DAMAGE - chronic, secondary to cumulative UV radiation exposure/sun exposure over time - diffuse scaly erythematous macules with underlying dyspigmentation - Recommend daily broad spectrum sunscreen SPF 30+ to sun-exposed areas, reapply every 2 hours as needed.  - Recommend staying in the shade or wearing long sleeves, sun glasses (UVA+UVB protection) and wide brim hats (4-inch brim around the entire circumference of the hat). - Call for new or changing lesions.  LENTIGINES Exam: scattered tan macules Due to sun exposure Treatment Plan: Benign-appearing, observe. Recommend daily broad spectrum sunscreen SPF 30+ to sun-exposed areas, reapply every 2 hours as needed.  Call for any changes  MELANOCYTIC NEVI Exam: Tan-brown and/or pink-flesh-colored symmetric macules and papules  Treatment Plan: Benign appearing on exam today. Recommend observation. Call clinic for new or changing moles. Recommend daily use of broad  spectrum spf 30+ sunscreen to sun-exposed areas.   SEBORRHEIC KERATOSIS - Stuck-on, waxy, tan-brown papules and/or plaques  - Benign-appearing - Discussed benign etiology and prognosis. - Observe - Call for any changes   Neoplasm of uncertain behavior of skin Left Shoulder - Anterior  Concerning for BCC but could be inflammatory (truncal acne/folliculitis). Follow up in 1 month for biopsy if still present.  Seborrheic keratoses  Lentigines  Multiple benign nevi  Actinic elastosis    Return if symptoms worsen or fail to improve.  Anise Salvo, RMA, am acting as scribe for Elie Goody, MD .   Documentation: I have reviewed the above documentation for accuracy and completeness, and I agree with the above.  Elie Goody, MD

## 2023-08-11 NOTE — Telephone Encounter (Addendum)
Attempted to contact pt regarding Ep[employer] education. Previous MyChart message currently unread. Unable to reach, l/m requesting call back.

## 2023-08-16 ENCOUNTER — Other Ambulatory Visit (INDEPENDENT_AMBULATORY_CARE_PROVIDER_SITE_OTHER): Payer: Medicaid Other

## 2023-08-16 DIAGNOSIS — E7800 Pure hypercholesterolemia, unspecified: Secondary | ICD-10-CM

## 2023-08-16 LAB — LIPID PANEL
Cholesterol: 173 mg/dL (ref 0–200)
HDL: 33.5 mg/dL — ABNORMAL LOW (ref >39.00)
LDL Cholesterol: 124 mg/dL — ABNORMAL HIGH (ref 0–99)
NonHDL: 139.69
Total CHOL/HDL Ratio: 5
Triglycerides: 76 mg/dL (ref 0.0–149.0)
VLDL: 15.2 mg/dL (ref 0.0–40.0)

## 2023-08-16 NOTE — Telephone Encounter (Signed)
Attempted to contact patient regarding [employer] education.     Received message, "The number you have dialed has calling restrictions that prevents the completion of your call."    MyChart message sent.    Lauro Franklin, PharmD, BCACP

## 2023-08-17 ENCOUNTER — Ambulatory Visit: Payer: Medicaid Other | Admitting: Internal Medicine

## 2023-08-18 ENCOUNTER — Ambulatory Visit (INDEPENDENT_AMBULATORY_CARE_PROVIDER_SITE_OTHER): Payer: Medicaid Other | Admitting: Psychology

## 2023-08-18 DIAGNOSIS — F411 Generalized anxiety disorder: Secondary | ICD-10-CM

## 2023-08-18 NOTE — Telephone Encounter (Signed)
Sheet printed and placed in your inbox for review

## 2023-08-18 NOTE — Progress Notes (Signed)
[facility] Counselor/Therapist Progress Note  Patient ID: Christine Rice, MRN: [id number]   Date: 08/18/23  Time Spent: 10:03 am - 10:51 am : 48  Minutes  Treatment Type: Individual Therapy.  Reported Symptoms: depression and anxiety.   Mental Status Exam: Appearance:  Well Groomed     Behavior: Appropriate  Motor: Normal  Speech/Language:  Normal Rate  Affect: Appropriate  Mood: anxious  Thought process: normal  Thought content:   WNL  Sensory/Perceptual disturbances:   WNL  Orientation: oriented to person, place, time/date, and situation  Attention: Good  Concentration: Good  Memory: WNL  Fund of knowledge:  Good  Insight:   Good  Judgment:  Good  Impulse Control: Good   Risk Assessment: Danger to Self:  No Self-injurious Behavior: No Danger to Others: No Duty to Warn:no Physical Aggression / Violence:No  Access to Firearms a concern: No  Gang Involvement:No   Subjective:   Christine Rice participated from home, via video, is aware of the limitations of tele-sessions, and consented to treatment. Therapist participated from office. [name] reviewed the events of the past week. Christine Rice noted an increase in interpersonal stressors with her boyfriend. She noted the argument getting heated and she noted a lack of support by her partner. We reviewed fair fighting rules, setting rules for time-lines for "serious" conversations,  and to eliminate score keeping. Therapist encouraged Christine Rice to identify her needs for the relationship and her needs for her future. Therapist modeled fair-fighting rules during the session and highlighted the possible avoidance Christine Rice is experiencing. Therapist encouraged Christine Rice to employ empathy and seeking to understand her partner's perspective to aid in addressing in conflict resolution. She noted professionally that things are going well, overall. She noted feeling a lack of support overall. She noted feeling a sense of sadness  but denied any safety concerns. She noted interest in spending time with a friend but noted worry that she would disrupt her friend's personal life. We worked I processing this and identifying ways to communicate without jumping to conclusions.  Christine Rice was engaged and motivated during the session and expressed commitment towards goals. Therapist validated and normalized her feelings and experiences and provided supportive therapy.   Interventions: interpersonal & CBT  Diagnosis:   Generalized anxiety disorder  Psychiatric Treatment: Yes , via PCP. See chart.    Treatment Plan:  Client Abilities/Strengths Christine Rice is forthcoming, self-aware, and motivated for change.   Support System: Boyfriend, friends.   Client Treatment Preferences Outpatient Therapy.   Client Statement of Needs Christine Rice would like to process parenting stressors, reduce negative self-talk,  self-care, managing mood proactively, increasing frustration tolerance, managing frustration, and engaging socially.   Treatment Level Weekly  Symptoms  Anxiety: anxious, irritability, difficulty managing worry, low frustration tolerance, difficulty leaving the home, social anxiety.    (Status: maintained) Depression: poor sleep, lethargy.    (Status: maintained)  Goals:   Christine Rice experiences symptoms of depression and anxiety.    Target Date: 01/20/24 Frequency: Weekly  Progress: 0 Modality: individual    Therapist will provide referrals for additional resources as appropriate.  Therapist will provide psycho-education regarding Christine Rice's diagnosis and corresponding treatment approaches and interventions. Licensed Clinical Social Worker, [place], LCSW will support the patient's ability to achieve the goals identified. will employ CBT, BA, Problem-solving, Solution Focused, Mindfulness,  coping skills, & other evidenced-based practices will be used to promote progress towards healthy functioning to help manage  decrease symptoms associated with her diagnosis.   Reduce overall level,  frequency, and intensity of the feelings of depression and anxiety as evidenced by decreased  from 6 to 7 days/week to 0 to 1 days/week per client report for at least 3 consecutive months. Verbally express understanding of the relationship between feelings of depression, anxiety and their impact on thinking patterns and behaviors. Verbalize an understanding of the role that distorted thinking plays in creating fears, excessive worry, and ruminations.    Christine Rice participated in the creation of the treatment plan)    [name], LCSW

## 2023-08-22 ENCOUNTER — Ambulatory Visit: Payer: Medicaid Other | Admitting: Family

## 2023-08-23 NOTE — Telephone Encounter (Signed)
Attempted to contact patient regarding Epclusa education. No answer, LVM requesting call back.     Lauro Franklin, PharmD, BCACP

## 2023-08-25 ENCOUNTER — Ambulatory Visit: Payer: Medicaid Other | Admitting: Psychology

## 2023-08-28 ENCOUNTER — Encounter (HOSPITAL_BASED_OUTPATIENT_CLINIC_OR_DEPARTMENT_OTHER): Payer: Self-pay | Admitting: Family

## 2023-08-30 ENCOUNTER — Other Ambulatory Visit: Payer: Self-pay

## 2023-08-30 ENCOUNTER — Other Ambulatory Visit: Payer: Self-pay | Admitting: Pharmacist

## 2023-08-30 MED ORDER — SOFOSBUVIR-VELPATASVIR 400-100 MG PO TABS
1.0000 | ORAL_TABLET | Freq: Every day | ORAL | 2 refills | Status: AC
Start: 2023-08-30 — End: ?
  Filled 2023-08-30: qty 28, 28d supply, fill #0
  Filled 2023-09-25: qty 28, 28d supply, fill #1
  Filled 2023-10-20: qty 28, 28d supply, fill #2

## 2023-08-30 NOTE — Addendum Note (Signed)
Addended by: Lauro Franklin on: 08/30/2023 01:13 PM     Modules accepted: Orders

## 2023-08-30 NOTE — Progress Notes (Signed)
Specialty Pharmacy     Specialty: Hepatitis C    Subjective   Jacqueline Fernandez is a 42 year old female, who is followed by the specialty pharmacy service for EPCLUSA (sofosbuvir/velpatasvir)    Regimen: sofosbuvir-velpatasvir (EPCLUSA) 400-100 MG tablet -  Take 1 tablet by mouth daily.    For clinical information, refer to most recent clinic encounter on 08/29/23 Telephone notes Rph Wang and 07/12/23 Non-face-to-face visit with Rph Kenney    Notes from encounter: Pt with chronic HCV GT pending, [place]. Likely non-cirrhotic per APRI/FIB-4, pending Fibroscan on 08/02/23. Pt is good candidate for DAA therapy, recommend Epclusa 1 tab po daily x 12 weeks per pt preference for less # of tablets. Will route RX to  Specialty Pharmacy to initiate auth process     Objective   Hepatitis C geno/subtype: lab pending  Treatment Naive: yes  Previous HCV Treatments: n/a    Fibrosis score: pending fibroscan  F0 - no fibrosis  F1 - portal fibrosis without septa  F2 - portal fibrosis with few septa  F3 - numerous septa without cirrhosis  F4 - cirrhosis    Fibrosis stage: pending fibroscan  AST 24 ALT 23 (07/07/23) PLT 185 (06/25/23)  APRI 0.324 FIB-4 1.14    BASELINE VL: [id number]   TW4 LABS: tbd   SVR12 LABS: due ~02/06/23    Medication Adherence    What concerns does the patient have in regards to their medications: Brand new start for Epclusa, adherence cannot be addressed at this time  Demonstrates understanding of importance of adherence: yes  Informant: patient  Reliability of informant: reliable  Provider-estimated medication adherence level: good  Reasons for non-adherence: no problems identified  Adherence tools used: alarm  Support network for adherence: family member  Confirmed plan for next specialty medication refill: delivery by pharmacy       Adverse Effects    Adverse events reported: No  *All other systems reviewed and are negative         Allergies Review  Allergies reviewed/updated: yes     Medication Review  Medication  review was performed: yes    Drug Interactions    Drug Interactions Evaluated: yes  Clinically Relevant Drug Interactions Identified: yes   Interactions list: Emtricitabine and Valacyclovir Hydrochloride  Emtricitabine causes synergistic or additive toxicity with Valacyclovir Hydrochloride  Monitor for valacyclovir or emtricitabine-related adverse events during concomitant use. Concomitant use may increase valacyclovir or emtricitabine concentrations. Coadministration of drugs that reduce renal function or compete for active tubular secretion, such as valacyclovir and emtricitabine, may increase the risk of adverse reactions.  Valacyclovir Hydrochloride and Tenofovir Alafenamide  Valacyclovir Hydrochloride may increase potential side effects of Tenofovir Alafenamide  Monitor for changes in renal function if tenofovir alafenamide is administered in combination with a nephrotoxic agent, such as valacyclovir. Tenofovir is primarily excreted via the kidneys by a combination of glomerular filtration and active tubular secretion. Coadministration of tenofovir alafenamide with a drug that reduces renal function or competes for active tubular secretion may increase concentrations of tenofovir and other renally eliminated drugs, thus, increasing the risk of adverse reactions.    Velpatasvir and Tenofovir Alafenamide  Velpatasvir may increase the serum concentration of Tenofovir Alafenamide  Monitor patients for tenofovir-associated adverse reactions, such as renal toxicity, in patients receiving regimens containing tenofovir alafenamide and velpatasvir due to potential increases in tenofovir serum concentrations. Tenofovir alafenamide is a substrate of the breast cancer resistance protein (BCRP),P-glycoprotein (P-gP), OATP1B1, and OATB1B3 transporters, while velpatasvir inhibits these transporters.  Drug Management Plan: Monitor for s/sx of ADRs and lab values          Lab Results   Component Value Date    AST 24  07/07/2023    ALT 23 07/07/2023    ALK 59 07/07/2023    TBILI 0.5 07/07/2023    DBILI <0.2 04/21/2015    TP 7.6 07/07/2023    ALB 4.2 07/07/2023     Lab Results   Component Value Date    HEPATITISCAB Nonreactive 12/16/2014    HEPBSURFABQT <3.5 12/16/2014    HEPBSURFACAG Nonreactive 12/16/2014    HBCAT Non Reactive 12/16/2014         Hepatitis B Screening    HBsAg--Positive  Total HBcAb -- Positive  IgM HBcAb  -- Positive  HBsAb  -- Negative Acute infection    HBsAg -- Positive  Total HBcAb  -- Positive  IgM HBcAb  -- Negative  HBsAb -- Negative Chronic Infection   HBsAg -- Negative  Total HBcAb  -- Positive  HBsAb  -- Positive Resolved Infection   HBsAg -- Negative  Total HBcAb  -- Negative  HBsAb  -- Positive2 Immune from receipt of prior vaccination (if documented complete series)   HBsAg -- Negative  Total HBcAb  -- Positive  HBsAb  -- Negative Refer to CDC guidance     Hepatitis B interpretation: No history/documentation of vaccination or past infection - susceptible.     Assessment & Plan   Indication, effectiveness, safety and convenience of her specialty medication(s) were reviewed today. Based on the information reviewed, therapy is appropriate at this time.     Clinical Specialty Pharmacy Goals   Based on continuation of therapy, clinical notes, and [facility] encounters, patient's symptoms are: new start  Based on documented history of reported issues, if applicable, patient is tolerating specialty & associated axillary medication(s) without adverse effects: n/a  Based on fill history, patient is routinely filling specialty medication(s) & associated axillary medications at the [facility]: n/a  Based on documented clinical notes and [facility] telephone encounters patient is taking/administering specialty & axillary medication(s) properly: n/a      Patient Counseling    Counseled the Patient on the Following: patient counseling declined, provided monograph and education materials  Other Topic: Patient is a  new start to this medication   Patient had medication teaching done by in-clinic clinical Rph. No further questions       Follow-up: SVR due  on or after ~02/06/23.  Patient to be reassessed per PMP Protocols    [facility]  Specialty Pharmacist

## 2023-08-30 NOTE — Progress Notes (Signed)
Enrolling patient to [facility] specialty pharmacy management system. Routing to Rph.    Indication: HEPATOLOGY    Specialty Drug(s): EPCLUSA (sofosbuvir/velpatasvir)    Patient is a new start to this medication    Patient had medication teaching done by in-clinic clinical Rph. No further questions    Jacqueline Fernandez  Specialty Pharmacy Technician

## 2023-08-30 NOTE — Telephone Encounter (Signed)
Contacted patient via phone for [facility] education.    Educated patient on how to take her Epclusa, importance of adherence, potential DDIs and management, potential ADRs and management, where to pick up medications and call for refills ahead of time, to bring medications with her should she be hospitalized to avoid breaks in therapy, provided treatment plan for patient to follow along with therapy, safe sex and safe needle practices to avoid reinfection risk.  Patient verbalized understanding.    MyChart message sent  Doximity text sent with clinic phone number and pharmacy phone number.   [facility] notified to contact patient for first fill.    Lauro Franklin, PharmD, BCACP

## 2023-08-30 NOTE — Progress Notes (Addendum)
FILL 1 OF 3  Enrolling patient in specialty pharmacy refill call program.     Indication: HEPATOLOGY    Specialty Drug(s): EPCLUSA (sofosbuvir/velpatasvir)    Delivery Date: Zollie Beckers 09/01/2023.    CREATING OUTREACH CALL TO PATIENT THREE WEEKS FROM TODAY.  12 WEEK TREATMENT    Discussed with patient to call [facility] [phone]) 5 days before they run out of medications if they have not heard from our pharmacy refill program by that time to schedule refills. This is to prevent delays in shipment and prevent lapse in therapy. If outside this time window, patient may have delays in receiving their medications. Also instructed the patient that the pharmacy must get verbal confirmation of shipping address, signature or no signature, and delivery date for [facility] refill, regardless if refill request was entered electronically, automated voice system, or voicemail. Shipments will not be sent until this is confirmed. All requirements must be completed to ensure delivery of medication in a timely manner.     Patient verbalized understanding.     Ida Rogue   Specialty Pharmacy Technician

## 2023-08-31 ENCOUNTER — Other Ambulatory Visit: Payer: Self-pay

## 2023-09-01 ENCOUNTER — Ambulatory Visit (INDEPENDENT_AMBULATORY_CARE_PROVIDER_SITE_OTHER): Payer: Medicaid Other | Admitting: Psychology

## 2023-09-01 DIAGNOSIS — F411 Generalized anxiety disorder: Secondary | ICD-10-CM

## 2023-09-01 NOTE — Progress Notes (Signed)
[facility] Counselor/Therapist Progress Note  Patient ID: Christine Rice, MRN: [id number]   Date: 09/01/23  Time Spent: 10:08 am - 10:46  am : 38 Minutes  Treatment Type: Individual Therapy.  Reported Symptoms: depression and anxiety.   Mental Status Exam: Appearance:  Well Groomed     Behavior: Appropriate  Motor: Normal  Speech/Language:  Normal Rate  Affect: Appropriate  Mood: anxious  Thought process: normal  Thought content:   WNL  Sensory/Perceptual disturbances:   WNL  Orientation: oriented to person, place, time/date, and situation  Attention: Good  Concentration: Good  Memory: WNL  Fund of knowledge:  Good  Insight:   Good  Judgment:  Good  Impulse Control: Good   Risk Assessment: Danger to Self:  No Self-injurious Behavior: No Danger to Others: No Duty to Warn:no Physical Aggression / Violence:No  Access to Firearms a concern: No  Gang Involvement:No   Subjective:   CHANI KARNATZ participated from home, via video, is aware of the limitations of tele-sessions, and consented to treatment. Therapist participated from home office. Abigal reviewed the events of the past week. Russella noted having a discussion about the relationship trajectory with her boyfriend, [name]. She noted that she and her partner have very different needs and expectations. We worked on exploring this during the session. She noted worry regarding her daughter in regards to her lack of employment and lack of budgeting. She noted difficulty managing her frustration with her daughter's decision-making and noted needing to provide empathy while maintaining her boundaries. We explored this during the session. Therapist modeled this during the session. We worked on identifying the best approach to communicate during these discussions. She noted a need to bolster her empathy. She noted stress with her want to be more directive and have her eldest daughter avoid stressors and the  frustration of her daughter not following her advice. We processed this and worked on delineating her expectations. Rilea was engaged and motivated during the session and expressed commitment towards goals. Therapist validated and normalized her feelings and experiences and provided supportive therapy.   Interventions: interpersonal & CBT  Diagnosis:   Generalized anxiety disorder  Psychiatric Treatment: Yes , via PCP. See chart.    Treatment Plan:  Client Abilities/Strengths Yennifer is forthcoming, self-aware, and motivated for change.   Support System: Boyfriend, friends.   Client Treatment Preferences Outpatient Therapy.   Client Statement of Needs Krisha would like to process parenting stressors, reduce negative self-talk,  self-care, managing mood proactively, increasing frustration tolerance, managing frustration, and engaging socially.   Treatment Level Weekly  Symptoms  Anxiety: anxious, irritability, difficulty managing worry, low frustration tolerance, difficulty leaving the home, social anxiety.    (Status: maintained) Depression: poor sleep, lethargy.    (Status: maintained)  Goals:   Daniela experiences symptoms of depression and anxiety.    Target Date: 01/20/24 Frequency: Weekly  Progress: 0 Modality: individual    Therapist will provide referrals for additional resources as appropriate.  Therapist will provide psycho-education regarding Ameirah's diagnosis and corresponding treatment approaches and interventions. Licensed Clinical Social Worker, [place], LCSW will support the patient's ability to achieve the goals identified. will employ CBT, BA, Problem-solving, Solution Focused, Mindfulness,  coping skills, & other evidenced-based practices will be used to promote progress towards healthy functioning to help manage decrease symptoms associated with her diagnosis.   Reduce overall level, frequency, and intensity of the feelings of depression and  anxiety as evidenced by decreased  from 6 to 7  days/week to 0 to 1 days/week per client report for at least 3 consecutive months. Verbally express understanding of the relationship between feelings of depression, anxiety and their impact on thinking patterns and behaviors. Verbalize an understanding of the role that distorted thinking plays in creating fears, excessive worry, and ruminations.    [name] participated in the creation of the treatment plan)    [name], LCSW

## 2023-09-06 ENCOUNTER — Other Ambulatory Visit: Payer: Self-pay

## 2023-09-06 ENCOUNTER — Telehealth (HOSPITAL_BASED_OUTPATIENT_CLINIC_OR_DEPARTMENT_OTHER): Payer: Self-pay | Admitting: Pharmacist

## 2023-09-06 DIAGNOSIS — N050 Unspecified nephritic syndrome with minor glomerular abnormality: Secondary | ICD-10-CM | POA: Diagnosis not present

## 2023-09-06 DIAGNOSIS — R809 Proteinuria, unspecified: Secondary | ICD-10-CM | POA: Diagnosis not present

## 2023-09-06 NOTE — Telephone Encounter (Signed)
Patient called back to report that she was able to get the medication and will be starting today.    HCV Treatment: Epclusa x 12 weeks  Start of Treatment (SOT):  09/06/2023  Anticipated End of Treatment (EOT): 10/31/2023  Anticipated SVR12: 01/01/2024    Lauro Franklin, PharmD, BCACP

## 2023-09-06 NOTE — Telephone Encounter (Signed)
Contacted patient regarding Epclusa start date. She reports that she has not received medication yet.    Contacted [facility] and was given tracking information. Medication was shipped to correct address and was signed for. Contacted patient and she will go to front desk to pick up medication.    Instructed patient to notify clinic when she starts medication.    Lauro Franklin, PharmD, BCACP

## 2023-09-07 ENCOUNTER — Other Ambulatory Visit: Payer: Self-pay

## 2023-09-13 ENCOUNTER — Telehealth (HOSPITAL_BASED_OUTPATIENT_CLINIC_OR_DEPARTMENT_OTHER): Payer: Self-pay | Admitting: Family

## 2023-09-13 NOTE — Telephone Encounter (Signed)
Birdena Jubilee, PHARMD  [facility] Scheduling  Hi, please contact pt to schedule f/u appt (overdue) with NP Conner. In-person visit preferred. Thanks!    ------------------------------------------------------------    Called pt, no answer. Left message to contact clinic back.     CALL TEAM:  Please schedule next avail RET appt with NP Conner (in-person). Thank you.

## 2023-09-18 NOTE — Telephone Encounter (Signed)
Spoke with patient and scheduled for 09/25/2023 with NP Conner.

## 2023-09-24 NOTE — Progress Notes (Signed)
[facility]    NURSE PRACTITIONER: Yancey Flemings, MSN, FNP-BC    Date / Time: Monday September 25, 2023      Referring provider: Dina Rich     Please send a copy of the consult to the referring physician      Reason for visit: Hep C eval//treat- week 4 visit    Patient Active Problem List   Diagnosis    Human immunodeficiency virus (HIV) disease (CMS-HCC)    History of methamphetamine abuse (CMS-HCC)    LGSIL of cervix of undetermined significance    Methamphetamine abuse (CMS-HCC)    Alcohol use disorder, moderate, dependence (CMS-HCC)     History of present illness:   Jacqueline Fernandez is a 42 year old female with HIV, controlled on ART, and recent dx of chronic HCV, 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 Now on treatment with Epclusa x 12wks, on week 3; presents for follow up.    Pt with  HIV + on Bictarvy, managed by [facility] clinic. No prior HCV treatment. Hx of IV /IN drug use, likely how infected. Denies current use. Reports heavy ETOH use, drinks most days, 5-12 drinks: up to a bottle; denies with drawl symptoms/tremor or withdrawal seizure. She has been in rehab programs in the past, last 2016 was abstinent from ETOH x 3 yrs with a brief relapse, then abstinent x 3 more years till relapsed again.     No h/o GIB/EGD, ascites or paracentesis, HE or jaundice episodes.        Hepatitis C History:  Genotype:2, Baseline VL 258,000  IU/mL  Prior HCV Treatment: none  Prior treatment response: n/a  Baseline polymorphisms:n/a  Fibrosis: pending    HCV Treatment: Epclusa x 12 weeks  Start of Treatment (SOT):  09/06/2023  Anticipated End of Treatment (EOT): 10/31/2023  Anticipated SVR12: 01/01/2024  Potential DDIs: none    Interval History  LOV 07/07/23 w/ this provider. Presents with case manager  On tx w/Epclusa approx week 3 ; tolerating well, no missed doses or c/f side effects.  Pt reports that she moved recently, pharmacy does not have new address  Reports took her last Bictarvy  today.   Reports drinking alcohol (unclear quantity/frequency)    S/p fibroscan- no sig scaring    Past medical history:  Past Medical History:   Diagnosis Date    HIV disease (CMS-HCC)      Psychiatric Illness: none    Past surgical history:  No past surgical history on file.    Current Medications:  bictegravir-emtricitabine-tenofovir (BIKTARVY) 50-200-25 MG tablet, Take 1 tablet by mouth daily.  naloxone (NARCAN) 4 mg/0.1 mL nasal spray, FOR SUSPECTED OVERDOSE, call 911! Tilt head, spray one spray into one nostril as needed for respiratory depression. If patient does not respond or responds and then relapses, repeat using a new nasal spray TO THE OTHER NOSTRIL every 3 minutes until emergency medical assistance arrives.  sofosbuvir-velpatasvir (EPCLUSA) 400-100 MG tablet, Take 1 tablet by mouth daily.  valACYclovir (VALTREX) 1000 mg tablet, Take 1 tablet (1,000 mg) by mouth daily.        Allergy:  No Known Allergies    Family history: No family history of liver disease or cancer  Family History   Problem Relation Name Age of Onset    No Known Problems Mother      No Known Problems Father         Social history:  Socioeconomic History    Marital status: Single  Tobacco Use    Smoking status: Every Day     Current packs/day: 1.00     Types: Cigarettes    Smokeless tobacco: Never   Substance and Sexual Activity    Alcohol use: No     Alcohol/week: 0.0 standard drinks of alcohol    Sexual activity: Yes     Partners: Male     Birth control/protection: Surgical     Current Living Situation:  Employment:      Substance Abuse History:  H/o IV/IN drug use; h/o meth use; unclear last use    Alcohol Use History:  Drinks 5-12 drinks daily most days; up to a bottle/d    Dietary supplement use: no      Review of Systems:  Weight loss:no  Trouble breathing: no  Abdominal pain: no  Bowel movement frequency: daily  Nausea / vomitting: no  Thyroid problems: no  Skin rash: no  Ascites:no  Confusion: no  Other:   Rest of the review  of system was negative    No ascites, edema, no hematemesis, hematochezia, melena, no jaundice, pruritis or acholic stools, no hepatic encephalopathy (no overt encephalopathy, no reversal of sleep/wake cycles, no memory/attention issues).     Physical examination:   BP 133/81 (BP Location: Right arm, BP Patient Position: Sitting, BP cuff size: Regular)   Pulse 83   Temp 96.7 F (35.9 C) (Temporal)   Resp 18   Ht 5\' 2"  (1.575 m)   Wt 70.8 kg (156 lb)   SpO2 97%   BMI 28.53 kg/m    Wt Readings from Last 2 Encounters:   09/25/23 70.8 kg (156 lb)   07/11/23 70.4 kg (155 lb 3.2 oz)      Blood Pressure   09/25/23 133/81   07/11/23 122/83      Pain Score:    General:  Alert and oriented x 3  Affect:  Normal  HEENT:  No scleral icterus, no conjunctival pallor, mucus membranes moist, neck supple without lymphadenopathy, no thryroid abnormalities appreciated  Lungs:  Clear to auscultation bilaterally  Cardiovascular:  No carotid bruit, regular rate and rhythm without murmurs, rubs or gallops.  Abdomen:   liver nonpalable, spleen nonpalpable, no ascites, no tenderness and no guarding  Extremities:  No peripheral edema  Skin:  Non-icteric, no palmar erythema, no spider angiomas, and no visible rash  Neuro:  No asterixis    Studies:  Labs: Following labs reviewed  Lab Results   Component Value Date    AST 24 07/07/2023    ALT 23 07/07/2023    ALK 59 07/07/2023    TP 7.6 07/07/2023    ALB 4.2 07/07/2023    TBILI 0.5 07/07/2023    DBILI <0.2 04/21/2015      Lab Results   Component Value Date    WBC 6.0 10/05/2017    RBC 4.72 10/05/2017    HGB 14.0 10/05/2017    HCT 41.4 10/05/2017    MCV 87.7 10/05/2017    MCHC 33.8 10/05/2017    RDW 12.5 10/05/2017    PLT 183 10/05/2017    MPV 11.4 10/05/2017      Lab Results   Component Value Date    NA 140 07/07/2023    K 3.7 07/07/2023    CL 107 07/07/2023    BICARB 24 07/07/2023    BUN 13 07/07/2023    CREAT 1.04 (H) 07/07/2023    GLU 73 07/07/2023    [place] 9.3 07/07/2023  Lab  Results   Component Value Date    BUN 13 07/07/2023    CREAT 1.04 (H) 07/07/2023    CL 107 07/07/2023    NA 140 07/07/2023    K 3.7 07/07/2023    [place] 9.3 07/07/2023    TBILI 0.5 07/07/2023    ALB 4.2 07/07/2023    TP 7.6 07/07/2023    AST 24 07/07/2023    ALK 59 07/07/2023    BICARB 24 07/07/2023    ALT 23 07/07/2023    GLU 73 07/07/2023        Viral serologies/labs:  Lab Results   Component Value Date    HEPATITISCAB Nonreactive 12/16/2014      No results found for: "HEPBSURFACAB"  No results found for: "HEPCRNA"  No results found for: "HEPBDNAQT"     Outside labs (in [place])  AST 61  ALT 133  Tbili 0.8  Cr 0.79    Imaging:  none    Procedures:    Fibroscan 08/02/23    Probe: XL     Median Stiffness: 6.7  Interquartile stiffness range/med: 16%  Number of Valid Measurements: 10  Total Number of Measurements: 12      Fasting: yes                                            Alcohol: yes had a sip of alcohol at 6am 4hrs before test     Interpretation: This scan meets quality criteria and is consistent with no significant fibrosis.       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 Now on treatment with Epclusa x 12wks, on week 3; presents for follow up.    Plan:I have examined the patient and reviewed all available pertinent labs, imaging, procedures and documents in EPIC and media.     Chronic HCV  GT2, Previously treatment naive, no significant fibrosis on fibroscan (08/02/23) w/6.7 kPa    HCV Treatment: Epclusa x 12 weeks  Start of Treatment (SOT):  09/06/2023  Anticipated End of Treatment (EOT): 10/31/2023  Anticipated SVR12: 01/01/2024  Potential DDIs: none    Fibroscan scale    HIV/HCV monoinfection  >7 kPa = F2  >9.5 kPa = F3  >11.9 kPa = F4    - I discussed the natural history of Hepatitis C infection including risk of progression to cirrhosis.   - recommend complete alcohol abstinence     -reviewed DAA therapy including options, duration, side effects, efficacy and overall process to initiate therapy  -medications reviewed for pot. interaction with DAA: concerns Bictarvy  - HBV ag neg   - will check Hepatitis A and B serologies; he will need vaccination against Hepatitis A and B if he is not immune.   - return to clinic at week 4 of DAA tx  - pt has a life expectancy of greater than 12 months referring to non-liver related conditions and is a good candidate for treatment  - pt is motivated to start treatment and has been counseled on the importance of adherence to HCV treatment   - pt understands that non-adherence to treatment may result in virologic relapse or resistance to HCV medications     Hepatitis C counseling: Patient counseled on transmission reduction by not sharing  personal items (e.g., razor, toothbrush, needle/syringe) that might have blood on them.  The prevalence of HCV infection is higher in persons who have multiple sexual partners. However, the risk of transmission between monogamous partners is low (1% - 5%). Experts generally do not recommend the use of barrier protection for monogamous couples. Others may wish to use barrier precautions to lower the limited risk of HCV transmission. Sexual partners may benefit from counseling and testing.     AUD  -discussed risk of progression of liver dz w/ETOH (+HCV)  -enc quit if possible (cut back if can't quit), enc recovery program  -discussed MAT: not interested at this time: consider for future    HIV  -controlled on ART, [facility]  -took last [place] today: msg pharmacy will assist with getting Rfs; advised pt to get today, discussed importance of avoiding missed doses      HCM  -immunizations: up to date/recommend routine immunizations for age from PCP  -avoid alcohol    Follow up:  Labs today  Complete Epclusa x 12 wks  Msg to pharmacy to advise of address change: pt to call w/new address  SRV 12 labs on/after Jan 01, 2024    RTC after SVR 12 labs     I personally spent total 35 minutes in face-to-face and non-face-to-face activities related to the patient's visit today, excluding any separately reportable services/procedures       Dina Rich, NP  Hepatology Nurse Practitioner

## 2023-09-25 ENCOUNTER — Other Ambulatory Visit: Payer: Self-pay

## 2023-09-25 ENCOUNTER — Ambulatory Visit: Payer: Medicaid Other | Attending: Family | Admitting: Family

## 2023-09-25 ENCOUNTER — Encounter (HOSPITAL_BASED_OUTPATIENT_CLINIC_OR_DEPARTMENT_OTHER): Payer: Self-pay | Admitting: Pharmacist

## 2023-09-25 ENCOUNTER — Other Ambulatory Visit (HOSPITAL_BASED_OUTPATIENT_CLINIC_OR_DEPARTMENT_OTHER): Payer: Self-pay | Admitting: Internal Medicine

## 2023-09-25 ENCOUNTER — Other Ambulatory Visit: Payer: Self-pay | Admitting: Pharmacist

## 2023-09-25 ENCOUNTER — Ambulatory Visit (INDEPENDENT_AMBULATORY_CARE_PROVIDER_SITE_OTHER): Payer: Medicaid Other | Admitting: Psychology

## 2023-09-25 VITALS — BP 133/81 | HR 83 | Temp 96.7°F | Resp 18 | Ht 62.0 in | Wt 156.0 lb

## 2023-09-25 DIAGNOSIS — F1020 Alcohol dependence, uncomplicated: Secondary | ICD-10-CM | POA: Insufficient documentation

## 2023-09-25 DIAGNOSIS — B20 Human immunodeficiency virus [HIV] disease: Secondary | ICD-10-CM

## 2023-09-25 DIAGNOSIS — B1920 Unspecified viral hepatitis C without hepatic coma: Secondary | ICD-10-CM | POA: Insufficient documentation

## 2023-09-25 DIAGNOSIS — F411 Generalized anxiety disorder: Secondary | ICD-10-CM

## 2023-09-25 MED ORDER — BICTEGRAVIR-EMTRICITAB-TENOFOV 50-200-25 MG PO TABS
1.0000 | ORAL_TABLET | Freq: Every day | ORAL | 0 refills | Status: DC
Start: 2023-09-25 — End: 2023-09-25

## 2023-09-25 NOTE — Telephone Encounter (Signed)
PHARMACY:    [id number] - [place], [place] - [address]  [address]  [place] [address]  Phone: [phone] Fax: [phone]    REQUESTED MEDICATION:  Requested Prescriptions     Pending Prescriptions Disp Refills    bictegravir-emtricitabine-tenofovir (BIKTARVY) 50-200-25 MG tablet 90 tablet 0     Sig: Take 1 tablet by mouth daily.      DATE OF LAST REFILL: 09/25/2023    Last visit in this department 08/17/2023  Next visit in this department 10/06/2023        Latest Ref Rng & Units 07/07/2023     2:56 PM 10/05/2017     1:20 PM 09/29/2016    12:37 PM 01/19/2016     1:46 PM 06/23/2015     5:07 PM 04/21/2015     3:17 PM 12/30/2014     2:10 PM   HIV Serology   CD4 Count 250 - 1,200 cells/uL -- 818  621  515  -- 468  251    CD4 % 29 - 61 % -- 44  41  41  -- 36  29    CD8 Count 100 - 800 cells/uL -- 702  529  504  -- 539  446    CD8 % 11 - 38 % -- 37  35  40  -- 41  51    CD4:CD8 Ratio 0.70 - 4.00 -- 1.16  1.17  1.02  -- 0.87  0.56    RNA-PCR Ultra See Comment copies/mL -- 62  Not Detected  Not Detected  Not Detected  Not Detected  --   RNA-PCR - [facility] NOT DETECTED copies/mL NOT DETECTED  -- -- -- -- -- --     No results found for: "AMPCLASS", "BARBCLASS", "BENZYLCGNN", "BENZDIAZCL", "METHADONE", "OPIATESCL", "OXY", "PHENCYCLDN", "TETCANNABIN", "UDI"  No results found for: "6AM", "CODEINE", "MOR", "FEN", "HCOD", "HMOR", "NFEN", "EDDPU", "METHD", "OXYC", "OXYM", "OPINT"   No results found for: "7AMCL", "AHYMI", "AHYTR", "AHYAL", "2HYEF", "LORA", "OXAZE", "DESAL", "TEMA", "NORDI", "BEINT"  Lab Results   Component Value Date    BUN 13 07/07/2023    CREAT 1.04 (H) 07/07/2023    CL 107 07/07/2023    NA 140 07/07/2023    K 3.7 07/07/2023    [place] 9.3 07/07/2023    TBILI 0.5 07/07/2023    ALB 4.2 07/07/2023    TP 7.6 07/07/2023    AST 24 07/07/2023    ALK 59 07/07/2023    BICARB 24 07/07/2023    ALT 23 07/07/2023    GLU 73 07/07/2023     No results found for: "A1C"  Lab Results   Component Value Date     WBC 6.0 10/05/2017    RBC 4.72 10/05/2017    HGB 14.0 10/05/2017    HCT 41.4 10/05/2017    MCV 87.7 10/05/2017    MCHC 33.8 10/05/2017    RDW 12.5 10/05/2017    PLT 183 10/05/2017    MPV 11.4 10/05/2017     Lab Results   Component Value Date    CHOL 165 12/16/2014    HDL 59 12/16/2014    LDLCALC 78 12/16/2014    TRIG 140 12/16/2014     No results found for: "RPR"  Lab Results   Component Value Date    COLORUA Yellow 05/25/2017    APPEARUA Hazy 05/25/2017    GLUCOSEUA Negative 05/25/2017    BILIUA Negative 05/25/2017    KETONEUA Negative 05/25/2017    SGUA 1.031 (  H) 05/25/2017    BLOODUA 1+ (A) 05/25/2017    PHUA 5.0 05/25/2017    PROTEINUA 1+ (A) 05/25/2017    UROBILUA Negative 05/25/2017    NITRITEUA Negative 05/25/2017    LEUKESTUA 3+ (A) 05/25/2017    WBCUA >50 (A) 05/25/2017    RBCUA 6-10 (A) 05/25/2017     No results found for: "PSA"  Vitamin D, 25-OH D2 (ng/mL)   Date Value   01/19/2016 <5     Vitamin D, 25-OH D3 (ng/mL)   Date Value   01/19/2016 25     Vitamin D, 25-OH TOTAL (ng/mL)   Date Value   01/19/2016 25       Current Medication(s):   bictegravir-emtricitabine-tenofovir Take 1 tablet by mouth daily. 90 tablet 0    naloxone FOR SUSPECTED OVERDOSE, call 911! Tilt head, spray one spray into one nostril as needed for respiratory depression. If patient does not respond or responds and then relapses, repeat using a new nasal spray TO THE OTHER NOSTRIL every 3 minutes until emergency medical assistance arrives. 2 each 11    sofosbuvir-velpatasvir Take 1 tablet by mouth daily. 28 tablet 2    valACYclovir Take 1 tablet (1,000 mg) by mouth daily. 30 tablet 5         Patient information: No Known Allergies     Health Maintenance Due   Topic Date Due    Breast Cancer Screen  Never done    OWEN CYTOPATH NON GYN (ANAL PAP)  Never done    Meningococcal MCV4 Vaccine (1 - Risk 2-dose series) Never done    COVID-19 Vaccine (1) Never done    Shingles Vaccine (1 of 2) Never done    LDL Monitoring  12/17/2015    OWEN  GONORRHEA SCREEN  12/17/2015    OWEN HEPATITIS B SCREEN  12/17/2015    OWEN CHLAMYDIA SCREEN  12/17/2015    Hepatitis C Screening  12/17/2015    OWEN LIPID (CHOL FRACT) PANEL  12/17/2015    OWEN SYPHILIS SCREEN  04/20/2016    Cervical Cancer Screening  07/26/2017    OWEN QUANTIFERON-TB OR TB INTRADERMAL  09/29/2017    OWEN CD4 T CELL SUBSETS  04/05/2018    OWEN URINALYSIS (UA)  05/25/2018    OWEN CBC WITH DIFF  10/05/2018    Influenza (1) 07/13/2023

## 2023-09-25 NOTE — Patient Instructions (Addendum)
It was a pleasure to see you today in clinic.    Follow up items as discussed:     Continue Epclusa (SOF/VEL)  daily for 3 months total  Call the specialty pharmacy to give new address  Please get labs at [facility] in the next few days  Get your Bictarvy refills TODAY!  5. Labs to test for cure on /after Jan 01, 2024.  Follow up visit 1 week after to review results            As a reminder, please do not go to the lab without confirming you have lab orders in the computer.  If the labs were to be done at [facility], you may call the lab at [phone] to confirm. If they were to be done elsewhere like [facility] or [facility], you may call them directly.      If you have any questions or concerns, please call us at [phone] or send a MyChart message (but do not use that for anything that requires immediate response/emergencies).     Thank you for allowing [place] to participate in your care today.        Regards,     Hassan Rowan, MSN, FNP-BC   Hepatology Nurse Practitioner  Ph. [phone]  Fx: [phone]        Please do not hesitate to call your [facility] Team at [phone] if you have any questions or concerns regarding appointments, procedures, medications, refills or insurance issues.  Fax Number: [phone]  Research Questions: please contact our NA[facility] at [phone]        _______________  If we have ordered imaging (ultrasound, CT scan or MRI):   - Call radiology scheduling at [phone] to schedule the ordered imaging.   - Your provider will evaluate the imaging within 7 business days after it is completed.   - You can call the liver center to request results if you have not seen results on your MyChart or received a call to review.     ___________________     If we ordered a paracentesis: (a procedure to remove fluid from you abdomen)  - Please call Interventional Radiology (IR) at [phone] to schedule  _________________    If we have ordered an Endoscopy (a procedure to evaluate the  health of your esophagus and stomach):   - The clinic will usually contact you to schedule this. If you have not heard from the clinic within 1 week, please call [phone].   - A letter will be sent to your address after you are scheduled with pre-procedure instructions.  __________________     If we ordered a fibroscan (a test to evaluate for any scarring (fibrosis) in the liver, to determine the stage of your liver disease):  - The clinic will usually contact you to schedule this. If you have not heard from the clinic within 2 weeks, please call [phone].  - You must be fasting for 3 hours before fibroscan, otherwise the results will be inaccurate.   - You provider will receive a notice of the interpreted result within 7 business days.

## 2023-09-25 NOTE — Progress Notes (Signed)
FILL 2 OF 3  Refill Reminder    Indication: HEPATOLOGY  Medication: EPCLUSA (sofosbuvir/velpatasvir)    Methods of HIPAA Verification (select 2): Name, DOB, Address, and Drug Prescribed    Changes Since Last Visit: None    Is patient ready to refill? Yes, refill initiated in [place].      Has the patient reported experiencing any of the following? N/A    Medication Adherence    Patient reported X missed doses in the last month: 0  Any gaps in refill history greater than 2 weeks in the last 3 months: no  Demonstrates understanding of importance of adherence: yes  Informant: patient  Reliability of informant: reliable  Provider-estimated medication adherence level: good  Adherence tools used: alarm  Support network for adherence: family member  Confirmed plan for next specialty medication refill: delivery by pharmacy       Jacqueline Fernandez has 10 day(s) of medication on hand.  Has Jacqueline Fernandez started or stopped any medications (includes Rx, OTC, herbal supplements) since last refill? no  Please confirm the following: Confirmed any changes to patient's phone number/address, Next outreach to patient, Offered pharmacist consultation, and Delivery - Date of Delivery [facility] 09/29/2023.    Patient Support    Was any additional patient support provided?: No, patient does not need extra support at this time.         Patient's current quality of life? (1=poor, 10=excellent) 8  Does patient need to speak with pharmacist? No intervention necessary.
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
## 2023-09-26 ENCOUNTER — Other Ambulatory Visit: Payer: Self-pay

## 2023-09-28 ENCOUNTER — Other Ambulatory Visit: Payer: Self-pay

## 2023-09-28 MED ORDER — BICTEGRAVIR-EMTRICITAB-TENOFOV 50-200-25 MG PO TABS
1.0000 | ORAL_TABLET | Freq: Every day | ORAL | 0 refills | Status: DC
Start: 2023-09-28 — End: 2023-10-06

## 2023-10-02 ENCOUNTER — Other Ambulatory Visit: Payer: Self-pay | Admitting: Family Medicine

## 2023-10-02 ENCOUNTER — Ambulatory Visit (INDEPENDENT_AMBULATORY_CARE_PROVIDER_SITE_OTHER): Payer: Medicaid Other | Admitting: Family Medicine

## 2023-10-02 ENCOUNTER — Encounter: Payer: Self-pay | Admitting: Family Medicine

## 2023-10-02 VITALS — BP 126/74 | HR 92 | Temp 98.2°F | Ht 65.25 in | Wt 184.0 lb

## 2023-10-02 DIAGNOSIS — Z6830 Body mass index (BMI) 30.0-30.9, adult: Secondary | ICD-10-CM

## 2023-10-02 DIAGNOSIS — Z5971 Insufficient health insurance coverage: Secondary | ICD-10-CM | POA: Diagnosis not present

## 2023-10-02 DIAGNOSIS — R42 Dizziness and giddiness: Secondary | ICD-10-CM | POA: Diagnosis not present

## 2023-10-02 NOTE — Patient Instructions (Addendum)
Try to get most of your carbohydrates from produce (with the exception of white potatoes) and whole grains Eat less bread/pasta/rice/snack foods/cereals/sweets and other items from the middle of the grocery store (processed carbs)  You may have to pay attention to portions   You may have to go cold [place] for sugar/ sweets for 2 weeks before you stop craving   Aim for exercise at least 5 days per week Add some strength training to your routine, this is important for bone and brain health and can reduce your risk of falls and help your body use insulin properly and regulate weight  Light weights, exercise bands , and internet videos are a good way to start  Yoga (chair or regular), machines , floor exercises or a gym with machines are also good options    I recommend NOOM and weight watchers on line programs   Let me know when you loose insurance entirely  We may be able to hook you up with a pharmacist to see if there is drug company help available   Use a steroid nasal spray (flonase or nasacort) daily in allergy season  If dizziness does not improve in 2 weeks let us know

## 2023-10-02 NOTE — Assessment & Plan Note (Addendum)
Intermittent and positional- happens when turning quickly  Not distressing Reassuring exam Likely some ETD in allergy season  Encouraged to start nasal steroid spray daily in season  Update if not starting to improve in a week or if worsening   Call back and Er precautions noted in detail today   33  Minutes were spent today both face to face and in the chart obtaining history, reviewing records and test results, performing exam , educating and discussing treatment options

## 2023-10-02 NOTE — Assessment & Plan Note (Signed)
Losing health ins at end of month  Has concerns about stopping duloxetine  May need to reach out to pharmacy team to see if she will qualify for pt assist

## 2023-10-02 NOTE — Progress Notes (Signed)
Subjective:    Patient ID: Christine Rice, female    DOB: 08-05-1981, 42 y.o.   MRN: [id number]  HPI  Wt Readings from Last 3 Encounters:  10/02/23 184 lb (83.5 kg)  07/25/23 184 lb 2 oz (83.5 kg)  11/10/22 179 lb 2 oz (81.3 kg)   30.39 kg/m  Vitals:   10/02/23 1120  BP: 126/74  Pulse: 92  Temp: 98.2 F (36.8 C)  SpO2: 98%    Pt presents with c/o obesity/ wanting to discuss desire for weight loss  Insurance is ending later this month  Will be losing medicaid -will no longer qualify   Wants to loose 20 lb    Is trying to watch diet  Mediterranean diet   Uses creamer in coffee  No fried foods or red meat-watching for cholesterol  Some peanut butter   No soda or sweet tea   Exercise Yoga     Menstrual status  Has IUD - no bleeding at all /is 42 years old Sees Dr Valentino Saxon   Some perimenopausal symptoms    Gets swimmy headed occational when she turns quickly   No ear pain  Some congestion on and off     Lab Results  Component Value Date   NA 135 07/19/2023   K 3.8 07/19/2023   CO2 27 07/19/2023   GLUCOSE 82 07/19/2023   BUN 9 07/19/2023   CREATININE 0.79 07/19/2023   CALCIUM 9.1 07/19/2023   GFR 92.39 07/19/2023   GFRNONAA >60 01/16/2020      Mood  Sees counselor  Takes cymbalta 60 mg daily     Patient Active Problem List   Diagnosis Date Noted   Does not have health insurance 10/02/2023   BMI 30.0-30.9,adult 07/25/2023   Hyperlipidemia 07/25/2023   Lump of skin of right lower extremity 02/08/2022   Myofascial pain dysfunction syndrome 10/26/2021   Dizziness 10/15/2021   Joint pain 08/26/2021   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   Chronic headaches 01/10/2020   H/O hernia repair 06/10/2019   History of smoking 10-25 pack years 07/14/2014   Routine general medical examination at a health care facility 07/07/2014   IBS 08/15/2007   History of HPV infection 08/14/2007   Anxiety disorder 08/14/2007   DEPRESSION 08/14/2007    Attention deficit disorder 08/14/2007   Past Medical History:  Diagnosis Date   Anxiety    Kidney failure    Nephrotic syndrome    Past Surgical History:  Procedure Laterality Date   CESAREAN SECTION  01/06/2005   CESAREAN SECTION  01/19/2012   Procedure: CESAREAN SECTION;  Surgeon: Turner Daniels, MD;  Location: [facility];  Service: Gynecology;  Laterality: N/A;  repeat   HERNIA REPAIR     INGUINAL HERNIA REPAIR  01/19/2012   Procedure: HERNIA REPAIR INGUINAL ADULT BILATERAL;  Surgeon: Ardeth Sportsman, MD;  Location: [facility];  Service: General;  Laterality: Bilateral;   RENAL BIOPSY, PERCUTANEOUS  01/06/2020       tummy tuck     UMBILICAL HERNIA REPAIR  01/19/2012   Procedure: HERNIA REPAIR UMBILICAL ADULT;  Surgeon: Ardeth Sportsman, MD;  Location: [facility];  Service: General;  Laterality: N/A;   UMBILICAL HERNIA REPAIR  11/19/2014   WISDOM TOOTH EXTRACTION     Social History   Tobacco Use   Smoking status: Former    Current packs/day: 0.00    Types: Cigarettes    Quit date: 11/15/2019    Years since quitting: 3.8   Smokeless  tobacco: Never  Vaping Use   Vaping status: Former  Substance Use Topics   Alcohol use: Not Currently   Drug use: Yes    Types: Marijuana    Comment: occ- marijuana    Family History  Problem Relation Age of Onset   Rashes / Skin problems Mother    COPD Father    Stroke Father    Healthy Daughter    Healthy Daughter    Breast cancer Neg Hx    Cancer Neg Hx    Allergies  Allergen Reactions   Amoxicillin Hives    REACTION: rash  No associated shortness of breath or throat swelling   Penicillins Hives   Lipitor [Atorvastatin]     Aches and pains and fatigue   Current Outpatient Medications on File Prior to Visit  Medication Sig Dispense Refill   ALPRAZolam (XANAX) 0.25 MG tablet TAKE ONE TABLET (0.25 MG TOTAL) BY MOUTH DAILY AS NEEDED FOR ANXIETY 30 tablet 2   ASPIRIN 81 PO Take 81 mg by mouth daily as needed.     DULoxetine (CYMBALTA) 60 MG  capsule Take 1 capsule (60 mg total) by mouth daily. 90 capsule 2   hydrOXYzine (VISTARIL) 25 MG capsule TAKE 1 CAPSULE BY MOUTH EVERY 8 HOURS ASNEEDED 90 capsule 3   levonorgestrel (MIRENA) 20 MCG/24HR IUD 1 each by Intrauterine route once.     Multiple Vitamin (MULTIVITAMIN) tablet Take 1 tablet by mouth daily.     No current facility-administered medications on file prior to visit.    Review of Systems  Constitutional:  Positive for fatigue. Negative for activity change, appetite change, fever and unexpected weight change.  HENT:  Positive for congestion. Negative for ear pain, rhinorrhea, sinus pressure and sore throat.   Eyes:  Negative for pain, redness and visual disturbance.  Respiratory:  Negative for cough, shortness of breath and wheezing.   Cardiovascular:  Negative for chest pain and palpitations.  Gastrointestinal:  Negative for abdominal pain, blood in stool, constipation and diarrhea.  Endocrine: Negative for polydipsia and polyuria.  Genitourinary:  Negative for dysuria, frequency and urgency.  Musculoskeletal:  Negative for arthralgias, back pain and myalgias.  Skin:  Negative for pallor and rash.  Allergic/Immunologic: Negative for environmental allergies.  Neurological:  Positive for dizziness. Negative for seizures, syncope, facial asymmetry, speech difficulty, light-headedness, numbness and headaches.  Hematological:  Negative for adenopathy. Does not bruise/bleed easily.  Psychiatric/Behavioral:  Negative for decreased concentration and dysphoric mood. The patient is not nervous/anxious.        Objective:   Physical Exam Constitutional:      General: She is not in acute distress.    Appearance: Normal appearance. She is well-developed. She is obese. She is not ill-appearing or diaphoretic.  HENT:     Head: Normocephalic and atraumatic.     Right Ear: Tympanic membrane, ear canal and external ear normal.     Left Ear: Tympanic membrane, ear canal and external  ear normal.     Ears:     Comments: TMs are dull bilat     Nose: Nose normal.     Mouth/Throat:     Mouth: Mucous membranes are moist.     Pharynx: Oropharynx is clear.  Eyes:     General:        Right eye: No discharge.        Left eye: No discharge.     Conjunctiva/sclera: Conjunctivae normal.     Pupils: Pupils are equal, round, and reactive  to light.     Comments: No nystagmus   Neck:     Thyroid: No thyromegaly.     Vascular: No carotid bruit or JVD.  Cardiovascular:     Rate and Rhythm: Normal rate and regular rhythm.     Heart sounds: Normal heart sounds.     No gallop.  Pulmonary:     Effort: Pulmonary effort is normal. No respiratory distress.     Breath sounds: Normal breath sounds. No wheezing or rales.  Abdominal:     General: There is no distension or abdominal bruit.     Palpations: Abdomen is soft.     Tenderness: There is no abdominal tenderness.  Musculoskeletal:     Cervical back: Normal range of motion and neck supple.     Right lower leg: No edema.     Left lower leg: No edema.  Lymphadenopathy:     Cervical: No cervical adenopathy.  Skin:    General: Skin is warm and dry.     Coloration: Skin is not pale.     Findings: No rash.  Neurological:     Mental Status: She is alert.     Cranial Nerves: No cranial nerve deficit.     Coordination: Coordination normal.     Deep Tendon Reflexes: Reflexes are normal and symmetric. Reflexes normal.  Psychiatric:        Mood and Affect: Mood is anxious.        Cognition and Memory: Cognition and memory normal.     Comments: Candidly discusses symptoms and stressors             Assessment & Plan:   Problem List Items Addressed This Visit       Other   BMI 30.0-30.9,adult - Primary    Discussed how this problem influences overall health and the risks it imposes  Reviewed plan for weight loss with lower calorie diet (via better food choices (lower glycemic and portion control) along with exercise  building up to or more than 30 minutes 5 days per week including some aerobic activity and strength training     Encouraged to try NOOM or weight watchers  Encouraged strongly to stop the sweetened coffee creamer  Encouraged to stop the emotional eating cycle (can talk to counselor) , and consider cutting out sweets Strongly encouraged strength training       Dizziness    Intermittent and positional- happens when turning quickly  Not distressing Reassuring exam Likely some ETD in allergy season  Encouraged to start nasal steroid spray daily in season  Update if not starting to improve in a week or if worsening   Call back and Er precautions noted in detail today   33  Minutes were spent today both face to face and in the chart obtaining history, reviewing records and test results, performing exam , educating and discussing treatment options           Does not have health insurance    Losing health ins at end of month  Has concerns about stopping duloxetine  May need to reach out to pharmacy team to see if she will qualify for pt assist

## 2023-10-02 NOTE — Assessment & Plan Note (Signed)
Discussed how this problem influences overall health and the risks it imposes  Reviewed plan for weight loss with lower calorie diet (via better food choices (lower glycemic and portion control) along with exercise building up to or more than 30 minutes 5 days per week including some aerobic activity and strength training     Encouraged to try NOOM or weight watchers  Encouraged strongly to stop the sweetened coffee creamer  Encouraged to stop the emotional eating cycle (can talk to counselor) , and consider cutting out sweets Strongly encouraged strength training

## 2023-10-03 ENCOUNTER — Encounter (HOSPITAL_BASED_OUTPATIENT_CLINIC_OR_DEPARTMENT_OTHER): Payer: Self-pay | Admitting: Pharmacist Clinician (PhC)/ Clinical Pharmacy Specialist

## 2023-10-03 NOTE — Progress Notes (Signed)
Interested in cab. Left VM to call back. Has upcoming appt with blanchard. Left VM.    ARV history: low confidence.   12/2014-02/2015             stribild  02/2015 -04/2015            Complera  04/21/2015-09/2017      Odefsey (VL = 62 09/2017)  09/2017 -present         Biktarvy     Baseline Genotype completed on 12/16/2014.  Results were NRTI: none, NNRTI: none, PI: none.     11/28/2019 genosure prime: NRTI: none, NNRTI: I135T, I178L, INI: none, PI: I62V - likely off ARVs at this time.    Last VL ND 07/07/2023    Given that pt was suppressed on rilpivirine based regimens, suspect it would be fine to move to cabenuva. The genosure prime was likely done when VL was up d/t non adherence. If patient willing to come to clinic, could start cab.     Carmelina Dane, PharmD

## 2023-10-04 ENCOUNTER — Telehealth (HOSPITAL_BASED_OUTPATIENT_CLINIC_OR_DEPARTMENT_OTHER): Payer: Self-pay | Admitting: Pharmacist

## 2023-10-04 ENCOUNTER — Encounter: Payer: Self-pay | Admitting: Pharmacist

## 2023-10-04 NOTE — Telephone Encounter (Signed)
Spoke to patient by phone and reminded her of week 4 labs at [facility] for HCV treatment. She reports doing well on Epclusa and has not missed any doses. She will go to [facility] today.    Lauro Franklin, PharmD, BCACP

## 2023-10-05 LAB — LIVER PANEL, BLOOD
ALT (SGPT): 18 U/L (ref 6–29)
AST (SGOT): 17 U/L (ref 10–30)
Albumin/Glob Ratio: 1.6 (calc) (ref 1.0–2.5)
Albumin: 4.7 g/dL (ref 3.6–5.1)
Alkaline Phos: 60 U/L (ref 31–125)
Bilirubin, Dir: 0.1 mg/dL (ref <=0.2)
Bilirubin, Indirect: 0.6 mg/dL (ref 0.2–1.2)
Bilirubin, Total: 0.7 mg/dL (ref 0.2–1.2)
Globulin: 2.9 g/dL (ref 1.9–3.7)
Total Protein: 7.6 g/dL (ref 6.1–8.1)

## 2023-10-05 LAB — HEPATITIS C RNA, QUANT BLOOD
HCV RNA Quant Real Time PCR: 28 [IU]/mL — ABNORMAL HIGH
Hcv Rna, Quantitative Real Time Pcr: 1.45 {Log} — ABNORMAL HIGH

## 2023-10-06 ENCOUNTER — Ambulatory Visit: Payer: Medicaid Other | Attending: Internal Medicine | Admitting: Internal Medicine

## 2023-10-06 ENCOUNTER — Encounter (HOSPITAL_BASED_OUTPATIENT_CLINIC_OR_DEPARTMENT_OTHER): Payer: Self-pay | Admitting: Internal Medicine

## 2023-10-06 VITALS — BP 116/70 | HR 77 | Temp 97.1°F | Resp 16 | Ht 62.0 in | Wt 165.0 lb

## 2023-10-06 DIAGNOSIS — E663 Overweight: Secondary | ICD-10-CM | POA: Insufficient documentation

## 2023-10-06 DIAGNOSIS — B20 Human immunodeficiency virus [HIV] disease: Secondary | ICD-10-CM | POA: Insufficient documentation

## 2023-10-06 DIAGNOSIS — R4586 Emotional lability: Secondary | ICD-10-CM | POA: Insufficient documentation

## 2023-10-06 DIAGNOSIS — S83005D Unspecified dislocation of left patella, subsequent encounter: Secondary | ICD-10-CM | POA: Insufficient documentation

## 2023-10-06 MED ORDER — BICTEGRAVIR-EMTRICITAB-TENOFOV 50-200-25 MG PO TABS
1.0000 | ORAL_TABLET | Freq: Every day | ORAL | 5 refills | Status: DC
Start: 2023-10-06 — End: 2023-11-08
  Filled 2023-10-10 – 2023-10-24 (×3): qty 30, 30d supply, fill #0
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
## 2023-10-09 ENCOUNTER — Ambulatory Visit (INDEPENDENT_AMBULATORY_CARE_PROVIDER_SITE_OTHER): Payer: Medicaid Other | Admitting: Psychology

## 2023-10-09 DIAGNOSIS — F411 Generalized anxiety disorder: Secondary | ICD-10-CM | POA: Diagnosis not present

## 2023-10-09 NOTE — Progress Notes (Signed)
[facility] Counselor/Therapist Progress Note  Patient ID: Christine Rice, MRN: [id number]   Date: 10/09/23  Time Spent: 10:03 am - 10:46 am : 43 Minutes  Treatment Type: Individual Therapy.  Reported Symptoms: depression and anxiety.   Mental Status Exam: Appearance:  Well Groomed     Behavior: Appropriate  Motor: Normal  Speech/Language:  Normal Rate  Affect: Appropriate  Mood: anxious  Thought process: normal  Thought content:   WNL  Sensory/Perceptual disturbances:   WNL  Orientation: oriented to person, place, time/date, and situation  Attention: Good  Concentration: Good  Memory: WNL  Fund of knowledge:  Good  Insight:   Good  Judgment:  Good  Impulse Control: Good   Risk Assessment: Danger to Self:  No Self-injurious Behavior: No Danger to Others: No Duty to Warn:no Physical Aggression / Violence:No  Access to Firearms a concern: No  Gang Involvement:No   Subjective:   Christine Rice participated from home, via video, is aware of the limitations of tele-sessions, and consented to treatment. Therapist participated from office. Christine Rice reviewed the events of the past week. Christine Rice noted recent transitions including a land purchase that was unexpectedly. She noted an increase in interpersonal stressors with her ex-partner. We explored this during the session. She noted stress related to her children being "sassy" and noted that her daughter told her to "shut-up". We explored this during the session and how she managed this.We discussed ways to communicate frustration, promote empathy, and maintain boundaries.We discussed the possibility of this behavior being a result of undiagnosed ADHd for her daughter. We explored this during the session.  She discussed the possibility of losing her insurance coverage within the next month. We discussed this during the session and how to manage this going forward. Therapist encouraged Christine Rice to contact her insurance  provider to confirm and to contact therapist's office afterwards. Christine Rice was engaged and motivated during the session. She expressed commitment towards goals. Therapist praised Christine Rice and provided supportive therapy. We scheduled a follow-up.   Interventions: interpersonal & CBT  Diagnosis:   Generalized anxiety disorder  Psychiatric Treatment: Yes , via PCP. See chart.    Treatment Plan:  Client Abilities/Strengths Christine Rice is forthcoming, self-aware, and motivated for change.   Support System: Boyfriend, friends.   Client Treatment Preferences Outpatient Therapy.   Client Statement of Needs Christine Rice would like to process parenting stressors, reduce negative self-talk,  self-care, managing mood proactively, increasing frustration tolerance, managing frustration, and engaging socially.   Treatment Level Weekly  Symptoms  Anxiety: anxious, irritability, difficulty managing worry, low frustration tolerance, difficulty leaving the home, social anxiety.    (Status: maintained) Depression: poor sleep, lethargy.    (Status: maintained)  Goals:   Christine Rice experiences symptoms of depression and anxiety.    Target Date: 01/20/24 Frequency: Weekly  Progress: 0 Modality: individual    Therapist will provide referrals for additional resources as appropriate.  Therapist will provide psycho-education regarding Christine Rice's diagnosis and corresponding treatment approaches and interventions. Licensed Clinical Social Worker, [place], LCSW will support the patient's ability to achieve the goals identified. will employ CBT, BA, Problem-solving, Solution Focused, Mindfulness,  coping skills, & other evidenced-based practices will be used to promote progress towards healthy functioning to help manage decrease symptoms associated with her diagnosis.   Reduce overall level, frequency, and intensity of the feelings of depression and anxiety as evidenced by decreased  from 6 to 7 days/week to 0  to 1 days/week per client report for at least  3 consecutive months. Verbally express understanding of the relationship between feelings of depression, anxiety and their impact on thinking patterns and behaviors. Verbalize an understanding of the role that distorted thinking plays in creating fears, excessive worry, and ruminations.    Christine Rice participated in the creation of the treatment plan)    Christine Ovens, LCSW

## 2023-10-10 ENCOUNTER — Other Ambulatory Visit: Payer: Self-pay

## 2023-10-10 ENCOUNTER — Encounter (HOSPITAL_BASED_OUTPATIENT_CLINIC_OR_DEPARTMENT_OTHER): Payer: Self-pay | Admitting: Internal Medicine

## 2023-10-10 DIAGNOSIS — B20 Human immunodeficiency virus [HIV] disease: Secondary | ICD-10-CM

## 2023-10-16 ENCOUNTER — Other Ambulatory Visit: Payer: Self-pay

## 2023-10-18 ENCOUNTER — Other Ambulatory Visit: Payer: Self-pay

## 2023-10-19 ENCOUNTER — Other Ambulatory Visit: Payer: Self-pay

## 2023-10-20 ENCOUNTER — Other Ambulatory Visit: Payer: Self-pay | Admitting: Pharmacist

## 2023-10-20 ENCOUNTER — Other Ambulatory Visit: Payer: Self-pay

## 2023-10-20 ENCOUNTER — Ambulatory Visit (INDEPENDENT_AMBULATORY_CARE_PROVIDER_SITE_OTHER): Payer: Medicaid Other | Admitting: Psychology

## 2023-10-20 DIAGNOSIS — F411 Generalized anxiety disorder: Secondary | ICD-10-CM | POA: Diagnosis not present

## 2023-10-20 NOTE — Progress Notes (Signed)
[facility] Counselor/Therapist Progress Note  Patient ID: Christine Rice, MRN: [id number]   Date: 10/20/23  Time Spent: 10:01 am - 10:51 am : 50 Minutes  Treatment Type: Individual Therapy.  Reported Symptoms: depression and anxiety.   Mental Status Exam: Appearance:  Well Groomed     Behavior: Appropriate  Motor: Normal  Speech/Language:  Normal Rate  Affect: Appropriate  Mood: anxious  Thought process: normal  Thought content:   WNL  Sensory/Perceptual disturbances:   WNL  Orientation: oriented to person, place, time/date, and situation  Attention: Good  Concentration: Good  Memory: WNL  Fund of knowledge:  Good  Insight:   Good  Judgment:  Good  Impulse Control: Good   Risk Assessment: Danger to Self:  No Self-injurious Behavior: No Danger to Others: No Duty to Warn:no Physical Aggression / Violence:No  Access to Firearms a concern: No  Gang Involvement:No   Subjective:   Christine Rice participated from home, via video, is aware of the limitations of tele-sessions, and consented to treatment. Therapist participated from office. Kimeko reviewed the events of the past week. Christine Rice noted working on her new property and noted telling people that she would be working and did not receive any check-ins during the weekend. She noted experiencing hurt feelings as a result.  We discussed working on processing her expectations and whether she communicated expectations. Therapist discussed uncommunicated expectations can often lead to disappointment. We discussed the importance of communicating our needs explicitly. Therapist modeled this during the session and noted that clear and concise communication being important. We reviewed clear and assertive communication. She noted being contact by her ex and noted this going poorly overall. We processed this during the session. She noted that he  "didn't do those sweet things with me". She noted "I got a lot off my  chest". She noted that her relationship with her partner is continuing to fizzle out. We continued to process this during the session. She noted continued strain with her mother and noted avoiding her mother's offers to spend time together. Therapist encouraged [name] to identify her expectations for her mother, and separately, work on processing her feelings regarding her ex, his behavior, and her difficulty disconnecting. Christine Rice was engaged and motivated during the session. She expressed commitment towards the session goals. Therapist praised Christine Rice and provided supportive therapy.   Interventions: interpersonal & CBT  Diagnosis:   Generalized anxiety disorder  Psychiatric Treatment: Yes , via PCP. See chart.    Treatment Plan:  Client Abilities/Strengths Landrey is forthcoming, self-aware, and motivated for change.   Support System: Boyfriend, friends.   Client Treatment Preferences Outpatient Therapy.   Client Statement of Needs [name] would like to process parenting stressors, reduce negative self-talk,  self-care, managing mood proactively, increasing frustration tolerance, managing frustration, and engaging socially.   Treatment Level Weekly  Symptoms  Anxiety: anxious, irritability, difficulty managing worry, low frustration tolerance, difficulty leaving the home, social anxiety.    (Status: maintained) Depression: poor sleep, lethargy.    (Status: maintained)  Goals:   Tashawn experiences symptoms of depression and anxiety.    Target Date: 01/20/24 Frequency: Weekly  Progress: 15% Modality: individual    Therapist will provide referrals for additional resources as appropriate.  Therapist will provide psycho-education regarding Christine Rice's diagnosis and corresponding treatment approaches and interventions. Licensed Clinical Social Worker, [place], LCSW will support the patient's ability to achieve the goals identified. will employ CBT, BA, Problem-solving,  Solution Focused, Mindfulness,  coping skills, &  other evidenced-based practices will be used to promote progress towards healthy functioning to help manage decrease symptoms associated with her diagnosis.   Reduce overall level, frequency, and intensity of the feelings of depression and anxiety as evidenced by decreased  from 6 to 7 days/week to 0 to 1 days/week per client report for at least 3 consecutive months. Verbally express understanding of the relationship between feelings of depression, anxiety and their impact on thinking patterns and behaviors. Verbalize an understanding of the role that distorted thinking plays in creating fears, excessive worry, and ruminations.    [name] participated in the creation of the treatment plan)    [name], LCSW

## 2023-10-24 ENCOUNTER — Other Ambulatory Visit: Payer: Self-pay

## 2023-10-24 NOTE — Progress Notes (Signed)
Fill 3 of 3 was dispensed at [facility] ([facility] on 10/24/2023 via [employer]. Spoke to patient to make sure she was informed of mail delivery that [facility] sent, patient gave verbal understanding. Also informed PharmD, Adriann, that medication is a specialty drug and requires specific documentation when dispensed.

## 2023-10-25 ENCOUNTER — Ambulatory Visit
Admission: RE | Admit: 2023-10-25 | Discharge: 2023-10-25 | Disposition: A | Discharge status: Home / Self Care | Payer: Medicaid Other | Attending: Family | Admitting: Family

## 2023-10-25 DIAGNOSIS — B1920 Unspecified viral hepatitis C without hepatic coma: Secondary | ICD-10-CM | POA: Insufficient documentation

## 2023-10-26 ENCOUNTER — Encounter (HOSPITAL_BASED_OUTPATIENT_CLINIC_OR_DEPARTMENT_OTHER): Payer: Self-pay | Admitting: Family

## 2023-10-30 ENCOUNTER — Other Ambulatory Visit: Payer: Self-pay

## 2023-10-30 ENCOUNTER — Encounter: Payer: Self-pay | Admitting: Family Medicine

## 2023-10-30 NOTE — Telephone Encounter (Signed)
Needs a visit -in person if possible  Thanks

## 2023-10-31 NOTE — Telephone Encounter (Signed)
lvmtcb

## 2023-11-01 ENCOUNTER — Ambulatory Visit: Payer: Medicaid Other | Admitting: Psychology

## 2023-11-01 DIAGNOSIS — F411 Generalized anxiety disorder: Secondary | ICD-10-CM

## 2023-11-01 NOTE — Progress Notes (Signed)
[facility] Counselor/Therapist Progress Note  Patient ID: SHATANNA SANTELL, MRN: [id number]   Date: 11/01/23  Time Spent: 12:03 pm - 12:59 pm : 56 Minutes  Treatment Type: Individual Therapy.  Reported Symptoms: depression and anxiety.   Mental Status Exam: Appearance:  Well Groomed     Behavior: Appropriate  Motor: Normal  Speech/Language:  Normal Rate  Affect: Tearful  Mood: dysthymic  Thought process: normal  Thought content:   WNL  Sensory/Perceptual disturbances:   WNL  Orientation: oriented to person, place, time/date, and situation  Attention: Good  Concentration: Good  Memory: WNL  Fund of knowledge:  Good  Insight:   Good  Judgment:  Good  Impulse Control: Good   Risk Assessment: Danger to Self:  No Self-injurious Behavior: No Danger to Others: No Duty to Warn:no Physical Aggression / Violence:No  Access to Firearms a concern: No  Gang Involvement:No   In case of a mental health emergency:  63 - confidential suicide hotline. [facility]        [address], [place] [address]       [phone] 3.   911  4.   Visiting Nearest ED.    Subjective:   Christine Rice participated from home, via video, is aware of the limitations of tele-sessions, and consented to treatment. Therapist participated from home office. Christine Rice reviewed the events of the past week. She was tearful during the session and noted a recent interaction with an ex and noted this being triggering and a reminder of past trauma. She noted a friend reached out to Christine Rice and noted her Rice being defensive and unsupportive. We highlighted reminder of trauma, unmet expectations, lack of responsibility and ownership by friend, self-focus of a friend. We processed this experience, the effects on her mood and outlook. She noted feelings of hurt and frustration. We worked on identifying her expectations for this event. We  worked on feeling identification and ways to manage her feelings, going forward. She noted recent recent SI and denied any current SI. We reviewed safety including contacting [phone], visiting the nearest ED and contacting 911. Christine Rice was in agreement to maintain safety and employ the aforementioned safety plan. We discussed working on setting boundaries for rumination, focusing on positives, and engaging more of the logical brain in relation to this previous experience. We schedule a follow-up to continue treatment. Christine Rice will contact the therapist ahead of the next appointment, should the need arise. Therapist validated Christine Rice's feelings and experience and provided supportive therapy.    Interventions: interpersonal & CBT  Diagnosis:   Generalized anxiety disorder  Psychiatric Treatment: Yes , via PCP. See chart.    Treatment Plan:  Client Abilities/Strengths Christine Rice is forthcoming, self-aware, and motivated for change.   Support System: Boyfriend, friends.   Client Treatment Preferences Outpatient Therapy.   Client Statement of Needs Nayali would like to process parenting stressors, reduce negative self-talk,  self-care, managing mood proactively, increasing frustration tolerance, managing frustration, and engaging socially.   Treatment Level Weekly  Symptoms  Anxiety: anxious, irritability, difficulty managing worry, low frustration tolerance, difficulty leaving the home, social anxiety.    (Status: maintained) Depression: poor sleep, lethargy.    (Status: maintained)  Goals:   Christine Rice experiences symptoms of depression and anxiety.    Target Date: 01/20/24 Frequency: Weekly  Progress: 15% Modality: individual    Therapist will provide referrals for additional resources as appropriate.  Therapist will provide psycho-education regarding Latravia's  diagnosis and corresponding treatment approaches and interventions. Licensed Clinical Social Worker, [place], LCSW  will support the patient's ability to achieve the goals identified. will employ CBT, BA, Problem-solving, Solution Focused, Mindfulness,  coping skills, & other evidenced-based practices will be used to promote progress towards healthy functioning to help manage decrease symptoms associated with her diagnosis.   Reduce overall level, frequency, and intensity of the feelings of depression and anxiety as evidenced by decreased  from 6 to 7 days/week to 0 to 1 days/week per client report for at least 3 consecutive months. Verbally express understanding of the relationship between feelings of depression, anxiety and their impact on thinking patterns and behaviors. Verbalize an understanding of the role that distorted thinking plays in creating fears, excessive worry, and ruminations.    Christine Rice participated in the creation of the treatment plan)    Christine Ovens, LCSW

## 2023-11-02 ENCOUNTER — Ambulatory Visit: Payer: Medicaid Other | Admitting: Internal Medicine

## 2023-11-02 ENCOUNTER — Other Ambulatory Visit: Payer: Self-pay

## 2023-11-02 ENCOUNTER — Ambulatory Visit: Payer: Medicaid Other | Attending: Internal Medicine | Admitting: Internal Medicine

## 2023-11-02 DIAGNOSIS — B20 Human immunodeficiency virus [HIV] disease: Secondary | ICD-10-CM
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
## 2023-11-03 ENCOUNTER — Telehealth: Payer: Self-pay | Admitting: Family Medicine

## 2023-11-03 ENCOUNTER — Encounter: Payer: Self-pay | Admitting: Family Medicine

## 2023-11-03 ENCOUNTER — Ambulatory Visit (INDEPENDENT_AMBULATORY_CARE_PROVIDER_SITE_OTHER): Payer: Medicaid Other | Admitting: Family Medicine

## 2023-11-03 VITALS — BP 122/70 | HR 90 | Temp 98.4°F | Ht 65.25 in | Wt 174.0 lb

## 2023-11-03 DIAGNOSIS — F419 Anxiety disorder, unspecified: Secondary | ICD-10-CM | POA: Diagnosis not present

## 2023-11-03 DIAGNOSIS — F32A Depression, unspecified: Secondary | ICD-10-CM | POA: Diagnosis not present

## 2023-11-03 MED ORDER — BUSPIRONE HCL 7.5 MG PO TABS: 7.5000 mg | ORAL_TABLET | Freq: Two times a day (BID) | ORAL | 2 refills | Status: DC

## 2023-11-03 MED ORDER — BUSPIRONE HCL 15 MG PO TABS: 7.5000 mg | ORAL_TABLET | Freq: Two times a day (BID) | ORAL | 2 refills | Status: AC

## 2023-11-03 NOTE — Progress Notes (Unsigned)
Subjective:    Patient ID: Christine Rice, female    DOB: Jan 18, 1981, 42 y.o.   MRN: [id number]  HPI  Wt Readings from Last 3 Encounters:  11/03/23 174 lb (78.9 kg)  10/02/23 184 lb (83.5 kg)  07/25/23 184 lb 2 oz (83.5 kg)   28.73 kg/m  Vitals:   11/03/23 1458  BP: 122/70  Pulse: 90  Temp: 98.4 F (36.9 C)  SpO2: 97%    Cut sugar and fast food and processed food  Has lost 10 lb  Listening to hunger vs non hunger cues   Pt presents for follow up of mood (anxiety and depression) Messaged she had problems with interactions  Also very sad and tearful   When she cries/ hard to stop   Some obtrusive thoughts   About half /half anxiety and depression    Current medicines   Cymbalta 60 mg daily  Hydroxyzine 25 mg tid prn at night (for itching also)-when stressed she scratches head  Xanax 0.25 mg daily prn   Sees counselor [name]   Some issues with an ex  Some old trauma from that  He got married  This upset her    Wants to start some exercise  Could add some exercise in the afternoon  Live is not always consistent   Appetite is down with mood  Sleeps too much much at times   ? 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   Chronic headaches 01/10/2020   H/O hernia repair 06/10/2019   History of smoking 10-25 pack years 07/14/2014   Routine general medical examination at a health care facility 07/07/2014   IBS 08/15/2007   History of HPV infection 08/14/2007   Anxiety and depression 08/14/2007    Attention deficit disorder 08/14/2007   Past Medical History:  Diagnosis Date   Anxiety    Kidney failure    Nephrotic syndrome    Past Surgical History:  Procedure Laterality Date   CESAREAN SECTION  01/06/2005   CESAREAN SECTION  01/19/2012   Procedure: CESAREAN SECTION;  Surgeon: Turner Daniels, MD;  Location: [facility];  Service: Gynecology;  Laterality: N/A;  repeat   HERNIA REPAIR     INGUINAL HERNIA REPAIR  01/19/2012   Procedure: HERNIA REPAIR INGUINAL ADULT BILATERAL;  Surgeon: Ardeth Sportsman, MD;  Location: [facility];  Service: General;  Laterality: Bilateral;   RENAL BIOPSY, PERCUTANEOUS  01/06/2020       tummy tuck     UMBILICAL HERNIA REPAIR  01/19/2012   Procedure: HERNIA REPAIR UMBILICAL ADULT;  Surgeon: Ardeth Sportsman, MD;  Location: [facility];  Service: General;  Laterality: N/A;   UMBILICAL HERNIA REPAIR  11/19/2014   WISDOM TOOTH EXTRACTION     Social History   Tobacco Use   Smoking status: Former    Current packs/day: 0.00    Types: Cigarettes    Quit date: 11/15/2019    Years since quitting: 3.9   Smokeless tobacco: Never  Vaping Use  Vaping status: Former  Substance Use Topics   Alcohol use: Not Currently   Drug use: Yes    Types: Marijuana    Comment: occ- marijuana    Family History  Problem Relation Age of Onset   Rashes / Skin problems Mother    COPD Father    Stroke Father    Healthy Daughter    Healthy Daughter    Breast cancer Neg Hx    Cancer Neg Hx    Allergies  Allergen Reactions   Amoxicillin Hives    REACTION: rash  No associated shortness of breath or throat swelling   Penicillins Hives   Lipitor [Atorvastatin]     Aches and pains and fatigue   Current Outpatient Medications on File Prior to Visit  Medication Sig Dispense Refill   ALPRAZolam (XANAX) 0.25 MG tablet TAKE ONE TABLET (0.25 MG TOTAL) BY MOUTH DAILY AS NEEDED FOR ANXIETY 30 tablet 2   ASPIRIN 81 PO Take 81 mg by mouth daily as needed.     DULoxetine (CYMBALTA) 60 MG  capsule TAKE ONE CAPSULE BY MOUTH DAILY 90 capsule 2   hydrOXYzine (VISTARIL) 25 MG capsule TAKE 1 CAPSULE BY MOUTH EVERY 8 HOURS ASNEEDED 90 capsule 3   levonorgestrel (MIRENA) 20 MCG/24HR IUD 1 each by Intrauterine route once.     Multiple Vitamin (MULTIVITAMIN) tablet Take 1 tablet by mouth daily.     No current facility-administered medications on file prior to visit.    Review of Systems     Objective:   Physical Exam        Assessment & Plan:   Problem List Items Addressed This Visit   None

## 2023-11-03 NOTE — Telephone Encounter (Signed)
Take 1 tablet (7.5 mg total) by mouth 2 (two) times daily., Starting Fri 11/03/2023, Normal  Dispense: 30 tablet  Refills: 2 ordered  Pharmacy: [facility] - [place], [place] - [address] (Ph: [phone])  Order Details Ordered on: 11/03/23  Authorizing provider: Judy Pimple, MD    Please change to 60 tablets for a 30 day supply  [facility] pharmacy

## 2023-11-03 NOTE — Patient Instructions (Addendum)
Think about setting some time for exercise  Walking is great  Add some strength training to your routine, this is important for bone and brain health and can reduce your risk of falls and help your body use insulin properly and regulate weight  Light weights, exercise bands , and internet videos are a good way to start  Yoga (chair or regular), machines , floor exercises or a gym with machines are also good options    Continue current medicines  Take care of yourself   Try buspar 7.5 mg twice daily (see how you feel , can be used as needed or on a schedule)   Keep up you fluids Continue fluids   Update me if any side effects or problems or if not helpful

## 2023-11-03 NOTE — Telephone Encounter (Signed)
I changed the prescription to 1/2 of 15 bid Thanks

## 2023-11-05 NOTE — Assessment & Plan Note (Signed)
Recently worsened with a stressful situation  Much tearfulness  Takes cymbalta 60 mg  Hydroxyzine for sleep/itching Seeing counselor - since last session some improvement  Discussed options for add on therapy Reviewed stressors/ coping techniques/symptoms/ support sources/ tx options and side effects in detail today  Will try buspar 7.5 mg bid (can also use prn if she chooses) Discussed expectations of SSRI medication including time to effectiveness and mechanism of action, also poss of side effects (early and late)- including mental fuzziness, weight or appetite change, nausea and poss of worse dep or anxiety (even suicidal thoughts)  Pt voiced understanding and will stop med and update if this occurs   Encouraged more exercise/ outdoor time  Continue counseling  Update if not improving or worse Call back and Er precautions noted in detail today

## 2023-11-06 ENCOUNTER — Telehealth (HOSPITAL_BASED_OUTPATIENT_CLINIC_OR_DEPARTMENT_OTHER): Payer: Self-pay

## 2023-11-06 ENCOUNTER — Other Ambulatory Visit: Payer: Self-pay

## 2023-11-06 DIAGNOSIS — B20 Human immunodeficiency virus [HIV] disease: Secondary | ICD-10-CM

## 2023-11-06 MED ORDER — CABOTEGRAVIR & RILPIVIRINE ER 600 & 900 MG/3ML IM SUER
INTRAMUSCULAR | 0 refills | Status: DC
Start: 2023-11-06 — End: 2023-11-07
  Filled 2023-11-06: qty 6, 30d supply, fill #0

## 2023-11-06 NOTE — Telephone Encounter (Signed)
Pharmacy Cabenuva Counseling Note    Received call back from pt about Cabenuva (original outreach 10/22 by Carmelina Dane).     Date: 11/06/23     Patient: Jacqueline Fernandez    MRN: [id number]      Patient was provided counseling on [place] today including importance of maintaining clear and open communication with the clinic with regards to any medication adverse events, scheduling future injections, or any changes in insurance.  Patient was educated to always notify the clinic if any new medications are started and to avoid herbals and vitamins without the approval of the clinic.  Patient was notified that they should receive their next injections within +/- 7 days from their scheduled injection visit.  Patient is aware to keep a supply of their oral ARVs on hand, in case of delays beyond 7 days of their next injection, and the possible need for re-loading should this occur.  The importance of adherence to prevent drug resistance and virologic failure was emphasized to the patient, and typical injection site reactions, post-injection reactions, and adverse reactions were described as well.  We also discussed the 1% chance of virologic failure and possible development of resistance, even with on time injections, for those receiving Cabenuva.      Pt also inquiring about next steps to start Ozempic (per, pt discussed with Dr. Teodoro Kil at last visit). Sent message to Dr. Teodoro Kil to clarify.      Linton Rump, PHARMD

## 2023-11-07 ENCOUNTER — Encounter (HOSPITAL_BASED_OUTPATIENT_CLINIC_OR_DEPARTMENT_OTHER): Payer: Self-pay | Admitting: Pharmacist

## 2023-11-07 ENCOUNTER — Other Ambulatory Visit: Payer: Self-pay

## 2023-11-07 DIAGNOSIS — B1920 Unspecified viral hepatitis C without hepatic coma: Secondary | ICD-10-CM

## 2023-11-07 MED ORDER — CABOTEGRAVIR & RILPIVIRINE ER 600 & 900 MG/3ML IM SUER
INTRAMUSCULAR | 6 refills | Status: DC
  Filled 2023-11-07: qty 6, fill #0
  Filled 2023-11-27 – 2023-12-11 (×4): qty 6, 60d supply, fill #0
  Filled 2024-01-30: qty 6, 60d supply, fill #1
  Filled 2024-03-26: qty 6, 60d supply, fill #2
  Filled 2024-05-27: qty 6, 60d supply, fill #3
  Filled 2024-07-30: qty 6, 60d supply, fill #4
  Filled 2024-10-01: qty 6, 60d supply, fill #5

## 2023-11-07 NOTE — Telephone Encounter (Signed)
Called pt back to confirm Cabenuva is covered. Scheduled for first injection 11/27 at 9:00am

## 2023-11-08 ENCOUNTER — Ambulatory Visit: Payer: Medicaid Other | Attending: Internal Medicine

## 2023-11-08 DIAGNOSIS — B20 Human immunodeficiency virus [HIV] disease: Secondary | ICD-10-CM | POA: Insufficient documentation

## 2023-11-08 MED ORDER — CABOTEGRAVIR & RILPIVIRINE ER 600 & 900 MG/3ML IM SUER
600.0000 mg | Freq: Once | INTRAMUSCULAR | Status: AC
Start: 2023-11-08 — End: 2023-11-08
  Administered 2023-11-08: 11:00:00 600 mg via INTRAMUSCULAR

## 2023-11-08 NOTE — Interdisciplinary (Signed)
2 Patient Identifiers verified Jacqueline Fernandez and 05-29-1981, Allergies, Dose, Vaccine/Medication & MD order verified prior to admin. Pt aware to wait 15 min after injection. Pt was closely monitored for any complications before leaving office. Appropriate discharge documentation provided.    Patient tolerated well, no AR noted.     Vaccine/Medication administered:   CABENUVA injections given LV/LVN II

## 2023-11-13 ENCOUNTER — Other Ambulatory Visit: Payer: Self-pay

## 2023-11-13 ENCOUNTER — Ambulatory Visit (INDEPENDENT_AMBULATORY_CARE_PROVIDER_SITE_OTHER): Payer: Medicaid Other | Admitting: Psychology

## 2023-11-13 DIAGNOSIS — F411 Generalized anxiety disorder: Secondary | ICD-10-CM | POA: Diagnosis not present

## 2023-11-13 NOTE — Progress Notes (Signed)
[facility] Counselor/Therapist Progress Note  Patient ID: Christine Rice, MRN: [id number]   Date: 11/13/23  Time Spent: 5:05 pm - 5:30 pm : 25 Minutes  Treatment Type: Individual Therapy.  Reported Symptoms: depression and anxiety.   Mental Status Exam: Appearance:  Well Groomed     Behavior: Appropriate  Motor: Normal  Speech/Language:  Normal Rate  Affect: Congruent and Depressed  Mood: dysthymic  Thought process: normal  Thought content:   WNL  Sensory/Perceptual disturbances:   WNL  Orientation: oriented to person, place, time/date, and situation  Attention: Good  Concentration: Good  Memory: WNL  Fund of knowledge:  Good  Insight:   Good  Judgment:  Good  Impulse Control: Good   Risk Assessment: Danger to Self:  No Self-injurious Behavior: No Danger to Others: No Duty to Warn:no Physical Aggression / Violence:No  Access to Firearms a concern: No  Gang Involvement:No   In case of a mental health emergency:  69 - confidential suicide hotline. Visiting Behavioral [facility] Urgent Care [facility]):        [address], [place] [address]       [phone] 3.   911  4.   Visiting Nearest ED.    Subjective:   Christine Rice participated from home, via video, is aware of the limitations of tele-sessions, and consented to treatment. Therapist participated from office. Luta reviewed the events of the past week.She noted meeting with her medical provider and was prescribed Buspar but noted not starting the medication yet. She noted trying online dating and noted that this is a distraction but was unsure if this what's best at this time. We explored this during the session. She noted ruminating and noted her rumination being obsessive. We reviewed developing worry time, identified positive and healthy distractions, and worked on shifting negative self-talk to positive. Therapist modeled this during the session. Therapist encouraged Christine Rice to  identify her basic needs in a relationship and to document this as she goes, between sessions, to be processed going forward. This includes her expectations in relationships. Christine Rice was engaged and motivated and expressed commitment towards goals. Therapist praised Christine Rice and provided supportive therapy. A follow-up was scheduled for continued treatment.     Interventions: interpersonal & CBT  Diagnosis:   No diagnosis found.  Psychiatric Treatment: Yes , via PCP. See chart.    Treatment Plan:  Client Abilities/Strengths Palin is forthcoming, self-aware, and motivated for change.   Support System: Boyfriend, friends.   Client Treatment Preferences Outpatient Therapy.   Client Statement of Needs Christine Rice would like to process parenting stressors, reduce negative self-talk,  self-care, managing mood proactively, increasing frustration tolerance, managing frustration, and engaging socially.   Treatment Level Weekly  Symptoms  Anxiety: anxious, irritability, difficulty managing worry, low frustration tolerance, difficulty leaving the home, social anxiety.    (Status: maintained) Depression: poor sleep, lethargy.    (Status: maintained)  Goals:   Christine Rice experiences symptoms of depression and anxiety.    Target Date: 01/20/24 Frequency: Weekly  Progress: 15% Modality: individual    Therapist will provide referrals for additional resources as appropriate.  Therapist will provide psycho-education regarding Christine Rice's diagnosis and corresponding treatment approaches and interventions. Licensed Clinical Social Worker, [place], LCSW will support the patient's ability to achieve the goals identified. will employ CBT, BA, Problem-solving, Solution Focused, Mindfulness,  coping skills, & other evidenced-based practices will be used to promote progress towards healthy functioning to help manage decrease symptoms associated with her  diagnosis.   Reduce overall level, frequency,  and intensity of the feelings of depression and anxiety as evidenced by decreased  from 6 to 7 days/week to 0 to 1 days/week per client report for at least 3 consecutive months. Verbally express understanding of the relationship between feelings of depression, anxiety and their impact on thinking patterns and behaviors. Verbalize an understanding of the role that distorted thinking plays in creating fears, excessive worry, and ruminations.    [name] participated in the creation of the treatment plan)    [name], LCSW

## 2023-11-21 ENCOUNTER — Encounter: Payer: Self-pay | Admitting: Certified Nurse Midwife

## 2023-11-21 ENCOUNTER — Other Ambulatory Visit (HOSPITAL_COMMUNITY)
Admission: RE | Admit: 2023-11-21 | Discharge: 2023-11-21 | Disposition: A | Discharge status: Home / Self Care | Payer: Medicaid Other | Source: Ambulatory Visit | Attending: Certified Nurse Midwife | Admitting: Certified Nurse Midwife

## 2023-11-21 ENCOUNTER — Ambulatory Visit (INDEPENDENT_AMBULATORY_CARE_PROVIDER_SITE_OTHER): Payer: Medicaid Other | Admitting: Certified Nurse Midwife

## 2023-11-21 VITALS — BP 110/68 | HR 71 | Ht 66.0 in | Wt 170.0 lb

## 2023-11-21 DIAGNOSIS — Z1151 Encounter for screening for human papillomavirus (HPV): Secondary | ICD-10-CM | POA: Insufficient documentation

## 2023-11-21 DIAGNOSIS — Z124 Encounter for screening for malignant neoplasm of cervix: Secondary | ICD-10-CM

## 2023-11-21 DIAGNOSIS — Z1231 Encounter for screening mammogram for malignant neoplasm of breast: Secondary | ICD-10-CM

## 2023-11-21 DIAGNOSIS — Z113 Encounter for screening for infections with a predominantly sexual mode of transmission: Secondary | ICD-10-CM

## 2023-11-21 DIAGNOSIS — Z01419 Encounter for gynecological examination (general) (routine) without abnormal findings: Secondary | ICD-10-CM | POA: Diagnosis not present

## 2023-11-21 DIAGNOSIS — Z114 Encounter for screening for human immunodeficiency virus [HIV]: Secondary | ICD-10-CM

## 2023-11-21 DIAGNOSIS — N898 Other specified noninflammatory disorders of vagina: Secondary | ICD-10-CM | POA: Diagnosis present

## 2023-11-21 NOTE — Progress Notes (Signed)
ANNUAL EXAM Patient name: Christine Rice MRN [id number]  Date of birth: October 01, 1981 Chief Complaint:   Annual Exam (Sti testing )  History of Present Illness:   Christine Rice is a 42 y.o. G53P2012 Caucasian female being seen today for a routine annual exam.  Current complaints: none, desires STI screening as long term relationship recently ended and she has concerns for fidelity. Unsure of last pap. Mirena placed 2021, happy with this method.  No LMP recorded. (Menstrual status: IUD).   Upstream - 11/21/23 1025       Pregnancy Intention Screening   Does the patient want to become pregnant in the next year? No    Does the patient's partner want to become pregnant in the next year? N/A    Would the patient like to discuss contraceptive options today? N/A      Contraception Wrap Up   Current Method IUD or IUS    End Method IUD or IUS    Contraception Counseling Provided No    How was the end contraceptive method provided? N/A            The pregnancy intention screening data noted above was reviewed. Potential methods of contraception were discussed. The patient elected to proceed with IUD or IUS.      Component Value Date/Time   DIAGPAP  08/18/2020 1505    - Negative for intraepithelial lesion or malignancy (NILM)   DIAGPAP  07/25/2019 0000    NEGATIVE FOR INTRAEPITHELIAL LESIONS OR MALIGNANCY.   HPVHIGH Negative 08/18/2020 1505   ADEQPAP  08/18/2020 1505    Satisfactory for evaluation; transformation zone component PRESENT.   ADEQPAP  07/25/2019 0000    Satisfactory for evaluation  endocervical/transformation zone component PRESENT.       Last pap 2021. Results were: NILM w/ HRHPV negative. H/O abnormal pap: yes, hx HPV+ Last mammogram: 3/24. Results were: normal. Family h/o breast cancer: no Last colonoscopy: n/a. Results were: N/A. Family h/o colorectal cancer: no     12/26/2022   10:27 AM 07/08/2022    9:31 AM 06/08/2022    2:06 PM 02/08/2022   10:20 AM  07/01/2021    9:58 AM  Depression screen PHQ 2/9  Decreased Interest  0 1 0 1  Down, Depressed, Hopeless  0 1 1 1   PHQ - 2 Score  0 2 1 2   Altered sleeping  0 0 2 1  Tired, decreased energy  0 3 2 2   Change in appetite  0 0 0 0  Feeling bad or failure about yourself   0 1 2 1   Trouble concentrating  1 0 2 3  Moving slowly or fidgety/restless  1 2 0   Suicidal thoughts  0 0 0 0  PHQ-9 Score  2 8 9 9   Difficult doing work/chores   Somewhat difficult Somewhat difficult Very difficult     Information is confidential and restricted. Go to Review Flowsheets to unlock data.        12/26/2022   10:30 AM 07/08/2022    9:32 AM 06/08/2022    2:08 PM 02/08/2022   10:20 AM  GAD 7 : Generalized Anxiety Score  Nervous, Anxious, on Edge  0 3 3  Control/stop worrying  1 3 3   Worry too much - different things  0 3 3  Trouble relaxing  0 2 2  Restless  1 2 1   Easily annoyed or irritable  1 2 1   Afraid - awful might happen  1 3 3   Total GAD 7 Score  4 18 16   Anxiety Difficulty   Somewhat difficult Somewhat difficult     Information is confidential and restricted. Go to Review Flowsheets to unlock data.     Review of Systems:   Pertinent items are noted in HPI Denies any headaches, blurred vision, fatigue, shortness of breath, chest pain, abdominal pain, abnormal vaginal discharge/itching/odor/irritation, problems with periods, bowel movements, urination, or intercourse unless otherwise stated above. Pertinent History Reviewed:  Reviewed past medical,surgical, social and family history.  Reviewed problem list, medications and allergies. Physical Assessment:   Vitals:   11/21/23 0845  BP: 110/68  Pulse: 71  Weight: 170 lb (77.1 kg)  Height: 5\' 6"  (1.676 m)  Body mass index is 27.44 kg/m.       Physical Exam Vitals reviewed.  Constitutional:      Appearance: Normal appearance.  HENT:     Head: Normocephalic.  Neck:     Thyroid: No thyroid mass or thyromegaly.  Cardiovascular:      Rate and Rhythm: Normal rate and regular rhythm.     Heart sounds: Normal heart sounds.  Pulmonary:     Effort: Pulmonary effort is normal.     Breath sounds: Normal breath sounds.  Chest:  Breasts:    Tanner Score is 5.     Right: Normal.     Left: Normal.  Abdominal:     General: Abdomen is flat.     Palpations: Abdomen is soft.     Tenderness: There is no abdominal tenderness.  Genitourinary:    General: Normal vulva.     Vagina: Normal.     Cervix: Normal.  Musculoskeletal:     Cervical back: Neck supple. No tenderness.  Skin:    General: Skin is warm and dry.  Neurological:     General: No focal deficit present.     Mental Status: She is alert and oriented to person, place, and time.  Psychiatric:        Mood and Affect: Mood normal.        Behavior: Behavior normal.      No results found for this or any previous visit (from the past 24 hour(s)).  Assessment & Plan:  1. Encounter for screening for human papillomavirus (HPV) - Cytology - PAP ([place]) - Cervicovaginal ancillary only  2. Screen for sexually transmitted diseases - RPR - HIV Antibody (routine testing w rflx) - Hepatitis C antibody  3. Screening for human immunodeficiency virus - HIV Antibody (routine testing w rflx)  4. Well woman exam with routine gynecological exam - Cytology - PAP ([place])  5. Cervical cancer screening - Cytology - PAP ([place])  6. Breast cancer screening by mammogram  7. Vagina itching - Cervicovaginal ancillary only Health promotion/maintenance reviewed, seen recently by PCP with annual labs completed. Following with PCP for mood management, has not needed buspar as much lately. STI screening, Pap completed today. Next mammogram due 3/25  Mammogram:  Due 02/2024 , or sooner if problems Colonoscopy: @ 42yo, or sooner if problems  Orders Placed This Encounter  Procedures   RPR   HIV Antibody (routine testing w rflx)   Hepatitis C antibody    Meds: No  orders of the defined types were placed in this encounter.   Follow-up: Return in 1 year (on 11/20/2024) for Annual exam.  SKYI GRAVELL, CNM 11/21/2023 10:25 AM

## 2023-11-21 NOTE — Patient Instructions (Signed)

## 2023-11-22 ENCOUNTER — Ambulatory Visit: Payer: Medicaid Other | Admitting: Psychology

## 2023-11-22 ENCOUNTER — Ambulatory Visit (INDEPENDENT_AMBULATORY_CARE_PROVIDER_SITE_OTHER): Payer: Medicaid Other | Admitting: Psychology

## 2023-11-22 DIAGNOSIS — F411 Generalized anxiety disorder: Secondary | ICD-10-CM

## 2023-11-22 LAB — CYTOLOGY - PAP
Comment: NEGATIVE
Diagnosis: NEGATIVE
High risk HPV: NEGATIVE

## 2023-11-22 LAB — HIV ANTIBODY (ROUTINE TESTING W REFLEX): HIV Screen 4th Generation wRfx: NONREACTIVE

## 2023-11-22 LAB — CERVICOVAGINAL ANCILLARY ONLY
Bacterial Vaginitis (gardnerella): NEGATIVE
Candida Glabrata: NEGATIVE
Candida Vaginitis: POSITIVE — AB
Chlamydia: NEGATIVE
Comment: NEGATIVE
Comment: NEGATIVE
Comment: NEGATIVE
Comment: NEGATIVE
Comment: NEGATIVE
Comment: NORMAL
Neisseria Gonorrhea: NEGATIVE
Trichomonas: NEGATIVE

## 2023-11-22 LAB — HEPATITIS C ANTIBODY: Hep C Virus Ab: NONREACTIVE

## 2023-11-22 LAB — RPR: RPR Ser Ql: NONREACTIVE
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
## 2023-11-23 ENCOUNTER — Other Ambulatory Visit: Payer: Self-pay | Admitting: Certified Nurse Midwife

## 2023-11-23 MED ORDER — FLUCONAZOLE 150 MG PO TABS: 150.0000 mg | ORAL_TABLET | Freq: Once | ORAL | 0 refills | Status: AC

## 2023-11-27 ENCOUNTER — Other Ambulatory Visit: Payer: Self-pay

## 2023-11-27 ENCOUNTER — Ambulatory Visit: Payer: Medicaid Other | Admitting: Psychology

## 2023-11-27 DIAGNOSIS — F411 Generalized anxiety disorder: Secondary | ICD-10-CM

## 2023-11-27 NOTE — Progress Notes (Signed)
[facility] Counselor/Therapist Progress Note  Patient ID: Christine Rice, MRN: [id number]   Date: 11/27/23  Time Spent: 9:04 am - 9:50 am : 46 Minutes  Treatment Type: Individual Therapy.  Reported Symptoms: depression and anxiety.   Mental Status Exam: Appearance:  Well Groomed     Behavior: Appropriate  Motor: Normal  Speech/Language:  Normal Rate  Affect: Congruent and Depressed  Mood: dysthymic  Thought process: normal  Thought content:   WNL  Sensory/Perceptual disturbances:   WNL  Orientation: oriented to person, place, time/date, and situation  Attention: Good  Concentration: Good  Memory: WNL  Fund of knowledge:  Good  Insight:   Good  Judgment:  Good  Impulse Control: Good   Risk Assessment: Danger to Self:  No Self-injurious Behavior: No Danger to Others: No Duty to Warn:no Physical Aggression / Violence:No  Access to Firearms a concern: No  Gang Involvement:No   In case of a mental health emergency:  21 - confidential suicide hotline. Visiting [facility]):        [address], [place] [address]       [phone] 3.   911  4.   Visiting Nearest ED.    Subjective:   Christine Rice participated from home, via video, is aware of the limitations of tele-sessions, and consented to treatment. Therapist participated from office. Christine Rice reviewed the events of the past week. She noted having a family therapy session but noted not being hopeful that things will get better. We explored this during the session and discussed the pros and cons of her mother's approach and perspective during the initial intake session. Therapist encouraged to be open to things improving. Therapist challenged Christine Rice's jumps to conclusions and emotional reasoning during the session. Christine Rice noted beginning to date someone new, Christine Rice, who is 5 years younger than Christine Rice. She noted things going well, overall. Therapist  encouraged Christine Rice to continue to work on identifying her needs in relationships and what to watch for. She noted, in the past, that she often focuses on what is going well in terms of effort and energy and ignoring stressors. We worked on exploring this during the session. We discussed the pros and cons of this approach. Christine Rice was engaged and motivated during the session. She expressed commitment towards goals. Therapist praised Christine Rice and provided supportive therapy. A follow-up was scheduled for continued treatment.    Interventions: interpersonal & CBT  Diagnosis:   Generalized anxiety disorder  Psychiatric Treatment: Yes , via PCP. See chart.    Treatment Plan:  Client Abilities/Strengths Christine Rice is forthcoming, self-aware, and motivated for change.   Support System: Boyfriend, friends.   Client Treatment Preferences Outpatient Therapy.   Client Statement of Needs Christine Rice would like to process parenting stressors, reduce negative self-talk,  self-care, managing mood proactively, increasing frustration tolerance, managing frustration, and engaging socially.   Treatment Level Weekly  Symptoms  Anxiety: anxious, irritability, difficulty managing worry, low frustration tolerance, difficulty leaving the home, social anxiety.    (Status: maintained) Depression: poor sleep, lethargy.    (Status: maintained)  Goals:   Christine Rice experiences symptoms of depression and anxiety.    Target Date: 01/20/24 Frequency: Weekly  Progress: 15% Modality: individual    Therapist will provide referrals for additional resources as appropriate.  Therapist will provide psycho-education regarding Christine Rice's diagnosis and corresponding treatment approaches and interventions. Licensed Clinical Social Worker, [place], LCSW will support the patient's ability to achieve the goals identified.  will employ CBT, BA, Problem-solving, Solution Focused, Mindfulness,  coping skills, & other  evidenced-based practices will be used to promote progress towards healthy functioning to help manage decrease symptoms associated with her diagnosis.   Reduce overall level, frequency, and intensity of the feelings of depression and anxiety as evidenced by decreased  from 6 to 7 days/week to 0 to 1 days/week per client report for at least 3 consecutive months. Verbally express understanding of the relationship between feelings of depression, anxiety and their impact on thinking patterns and behaviors. Verbalize an understanding of the role that distorted thinking plays in creating fears, excessive worry, and ruminations.    Christine Rice participated in the creation of the treatment plan)    [name], LCSW

## 2023-11-28 ENCOUNTER — Encounter (HOSPITAL_BASED_OUTPATIENT_CLINIC_OR_DEPARTMENT_OTHER): Payer: Self-pay | Admitting: Pharmacist

## 2023-11-28 NOTE — Telephone Encounter (Signed)
 Spoke with patient who states that she does not want to cancel and be rescheduled until March 2025.  I read pharmacist Jeff's myChart message as patient had not read it, but still opted to not have appt cancelled and wants to keep January appt with NP Conner.    Routing message for awareness. Thank you.

## 2023-11-29 ENCOUNTER — Other Ambulatory Visit: Payer: Self-pay

## 2023-11-29 NOTE — Addendum Note (Signed)
Addended by: Maggie Font B on: 11/29/2023 03:50 PM   Modules accepted: Level of Service

## 2023-11-30 ENCOUNTER — Ambulatory Visit (INDEPENDENT_AMBULATORY_CARE_PROVIDER_SITE_OTHER): Payer: Medicaid Other | Admitting: Psychology

## 2023-11-30 ENCOUNTER — Ambulatory Visit: Payer: Medicaid Other | Admitting: Psychology

## 2023-11-30 DIAGNOSIS — F411 Generalized anxiety disorder: Secondary | ICD-10-CM | POA: Diagnosis not present
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
## 2023-12-01 ENCOUNTER — Other Ambulatory Visit: Payer: Self-pay

## 2023-12-08 ENCOUNTER — Ambulatory Visit (INDEPENDENT_AMBULATORY_CARE_PROVIDER_SITE_OTHER): Payer: Medicaid Other | Admitting: Psychology

## 2023-12-08 DIAGNOSIS — F411 Generalized anxiety disorder: Secondary | ICD-10-CM
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
## 2023-12-11 ENCOUNTER — Encounter (HOSPITAL_BASED_OUTPATIENT_CLINIC_OR_DEPARTMENT_OTHER): Payer: Self-pay | Admitting: Pharmacist

## 2023-12-11 ENCOUNTER — Other Ambulatory Visit: Payer: Self-pay

## 2023-12-11 ENCOUNTER — Ambulatory Visit: Payer: Medicaid Other | Attending: Internal Medicine

## 2023-12-11 DIAGNOSIS — B20 Human immunodeficiency virus [HIV] disease: Secondary | ICD-10-CM | POA: Insufficient documentation

## 2023-12-11 MED ORDER — CABOTEGRAVIR & RILPIVIRINE ER 600 & 900 MG/3ML IM SUER
600.0000 mg | Freq: Once | INTRAMUSCULAR | Status: AC
Start: 2023-12-11 — End: 2023-12-11
  Administered 2023-12-11: 600 mg via INTRAMUSCULAR

## 2023-12-11 NOTE — Interdisciplinary (Signed)
 Injection Administration    Pt was identified with 2 patient identifiers  Allergies Verified    Patients Name Jacqueline Fernandez    Patients Date of Birth 07-11-1981   Allergies No Known Allergies      Administered:   Dose, Medication, and MD order verified before administration.  Information given to pt  Pt advised that best practice is to wait in clinic or in waiting room for at least 15 minutes after injection administration to watch for possible allergic reaction symptoms including itching, rash, difficulty breathing, swelling of face or throat, fast heartbeat, dizziness, weakness, or any other symptoms that cause concern for pt. If pt experiences any of these reactions pt is to notify front desk staff or clinic staff immediately. Pt verbalized good understanding.    Administered:  Administrations This Visit       cabotegravir  600 mg/3 mL & rilpivirine  ER 900 mg/3 mL (CABENUVA ) IM injection       Admin Date  12/11/2023 Action  Given Dose  600 mg of cabotegravir  Route  IntraMUSCULAR Documented By  Philis Brazier, LVN                      Surafel Hilleary [place], [place].  December 11, 2023, 2:04 PM.

## 2023-12-15 ENCOUNTER — Ambulatory Visit (INDEPENDENT_AMBULATORY_CARE_PROVIDER_SITE_OTHER): Payer: Medicaid Other | Admitting: Psychology

## 2023-12-15 DIAGNOSIS — F411 Generalized anxiety disorder: Secondary | ICD-10-CM | POA: Diagnosis not present

## 2023-12-15 NOTE — Progress Notes (Signed)
 [facility] Counselor/Therapist Progress Note  Patient ID: Christine Rice, MRN: [id number],    Date: 12/15/2023  Time Spent:  60  minutes     start time: 1400    end time:  1500  Treatment Type: Family Therapy  Reported Symptoms: Pts present for session via Caregility video, granting consent for the session.  Pts understand the limits of virtual sessions and share that they are in their homes with no one else present.  I shared with pts that I am in my office with no one else here.    Mental Status Exam: Appearance:  Casual     Behavior: Appropriate  Motor: Normal  Speech/Language:  Clear and Coherent  Affect: Appropriate  Mood: normal  Thought process: normal  Thought content:   WNL  Sensory/Perceptual disturbances:   WNL  Orientation: oriented to person, place, and time/date  Attention: Good  Concentration: Good  Memory: WNL  Fund of knowledge:  Good  Insight:   Good  Judgment:  Good  Impulse Control: Good   Risk Assessment: Danger to Self:  No Self-injurious Behavior: No Danger to Others: No Duty to Warn:no Physical Aggression / Violence:No  Access to Firearms a concern: No  Gang Involvement:No   Subjective: Pts share that they both had an event together with Christine Rice's sister and her niece since our last session.  Christine Rice did not know Christine Rice was going to be there when she arrived.  Christine Rice did have short interactions with Christine Rice and they were OK.  Christine Rice was not aware that her niece had called Christine Rice to to invite him to come by; Christine Rice was not aware of that either.  Talked with Christine Rice about how emotions often cloud our judgement and can color our view of our experiences; she agrees with this idea.  Asked her to begin allowing herself to think about the possibility that her mom loves her and Christine Rice both and equally and that she does not have to come in 2nd.  Congratulated Christine Rice on dealing so well with the uncomfortable meeting with Christine Rice last weekend; Christine Rice echoed  those sentiments.  Encouraged pts to continue with their self care activities and we will meet in 3 wks for a follow up session  Interventions: Cognitive Behavioral Therapy  Diagnosis:Generalized anxiety disorder  Plan: Treatment Plan Strengths/Abilities:  Intelligent, Intuitive, Willing to participate in therapy Treatment Preferences:  Outpatient Individual Therapy Statement of Needs:  Patient is to use CBT, mindfulness and coping skills to help manage and/or decrease symptoms associated with their diagnosis. Symptoms:  Depressed/Irritable mood, worry, social withdrawal Problems Addressed:  Depressive thoughts, Sadness, Sleep issues, etc. Long Term Goals:  Pt to reduce overall level, frequency, and intensity of the feelings of depression/anxiety as evidenced by decreased irritability, negative self talk, and helpless feelings from 6 to 7 days/week to 0 to 1 days/week, per client report, for at least 3 consecutive months.  Progress:  Short Term Goals:  Pt to verbally express understanding of the relationship between feelings of depression/anxiety and their impact on thinking patterns and behaviors.  Pt to verbalize an understanding of the role that distorted thinking plays in creating fears, excessive worry, and ruminations.  Progress:  Target Date:  11/29/2024 Frequency:  Bi-weekly Modality:  Cognitive Behavioral Therapy Interventions by Therapist:  Therapist will use CBT, Mindfulness exercises, Coping skills and Referrals, as needed by client. Client has verbally approved this treatment plan.  Christine Rice, [facility]

## 2023-12-20 ENCOUNTER — Encounter (HOSPITAL_BASED_OUTPATIENT_CLINIC_OR_DEPARTMENT_OTHER): Payer: Self-pay

## 2023-12-20 DIAGNOSIS — B20 Human immunodeficiency virus [HIV] disease: Secondary | ICD-10-CM

## 2023-12-20 NOTE — Progress Notes (Signed)
 [facility] ARV Therapy progress note    S: Jacqueline Fernandez is a 43 year old female living with HIV called regarding recent labs. Pt Started Cabenuva 11/08/23  , second dose given 12/11/23. Recent VL 145 copies, previous VL was not detected 07/07/2023. Discussed elevated VL with pt and asked if there was any period of time between VL in July and starting [place] that she was off [place] or taking it intermittently, and pt reports she was consistently taking the [place]. [facility] Aid fill history shows 90 day supply dispensed 07/02/23 and 09/26/23).       O:               Latest Ref Rng & Units 12/19/2023     4:09 PM 07/07/2023     2:56 PM 10/05/2017     1:20 PM 09/29/2016    12:37 PM 01/19/2016     1:46 PM 06/23/2015     5:07 PM 04/21/2015     3:17 PM   HIV Serology   CD4 Count 250 - 1,200 cells/uL -- -- 818  621  515  -- 468    CD4 % 29 - 61 % -- -- 44  41  41  -- 36    CD8 Count 100 - 800 cells/uL -- -- 702  529  504  -- 539    CD8 % 11 - 38 % -- -- 37  35  40  -- 41    CD4:CD8 Ratio 0.70 - 4.00 -- -- 1.16  1.17  1.02  -- 0.87    RNA-PCR Ultra See Comment copies/mL -- -- 62  Not Detected  Not Detected  Not Detected  Not Detected    RNA-PCR - [facility] NOT DETECTED copies/mL 145  NOT DETECTED  -- -- -- -- --      Lab Results   Component Value Date    CREAT 1.04 (H) 07/07/2023    GFRNON >60 10/05/2017      Lab Results   Component Value Date    HGB 14.0 10/05/2017    HCT 41.4 10/05/2017    MCV 87.7 10/05/2017    PLT 183 10/05/2017    WBC 6.0 10/05/2017    ABSNEUTRO 3.5 10/05/2017      Lab Results   Component Value Date    AST 17 11/07/2023    ALT 16 11/07/2023    ALK 52 11/07/2023    TP 6.9 11/07/2023    ALB 4.3 11/07/2023    TBILI 0.7 11/07/2023    DBILI 0.1 11/07/2023         A:    1. ARV   Current regimen of Cabenuva since 11/08/23.   Adherence: Second injection given on time   Tolerance: No AEs .   Response:  VL up to 145 from ND in July     ARV history: (low confidence)    12/2014-02/2015             Strib[place]  02/2015  -04/2015            Complera  04/21/2015-09/2017      Odefsey  09/2017- 11/08/2023   Biktarvy      Baseline Genotype completed on 12/16/2014.  Results were NRTI: none, NNRTI: none, PI: none.   11/28/2019 genosure prime: NRTI: none, NNRTI: I135T, I178L, INI: none, PI: I62V  Antiretroviral Therapy assessment: Pt recently switched from [place] to [place]. Most recent viral load increased from ND to 145. Discussed with patient that this could be  a viral blip, or could be early failure of Cabenuva.  Pt agreeable to getting an archive genotype to see if there are NNRTI mutations that have developed since previous genotyping in 2020, will reorder a VL as well to see if there is any indication if there is progression in VL. Pt asking if she should restart Biktarvy. Advised that she wait until we get the repeat viral load for further advice on restarting oral ART. If there is concern for drug resistance will need to decide between restarting Biktravy vs Symtuza while awaiting archive results.     2. OI prophylaxis:Not indicated  based on last CD4 from 2018. Will add CD4 count to labs today        P:    1. ARVs: Keep Cabenuva appt for next scheduled appt 02/05/24. Hold off on restarting oral medication until labs drawn and we have repeat VL.   2. Lab orders: Lab   Orders Placed This Encounter   Procedures    HIV-1 Resistance, Proviral DNA (RTI, PI, INT Inhib) - [facility]    HIV-1 Quantitative Viral Load Assay, Plasma - See Instructions    Lymphocytes Subset Panel 5-[facility]     4. RTC with retro when labs resulted. Pt does not have a PCP appt scheduled. She does not know her schedule and said she will call in to reschedule PCP appt at a later time.

## 2023-12-22 ENCOUNTER — Ambulatory Visit (INDEPENDENT_AMBULATORY_CARE_PROVIDER_SITE_OTHER): Payer: Medicaid Other | Admitting: Psychology

## 2023-12-22 DIAGNOSIS — F411 Generalized anxiety disorder: Secondary | ICD-10-CM

## 2023-12-22 LAB — LYMPHOCYTE SUBSET PANEL 5-QUEST
Absolute CD4+ Cells: 420 {cells}/uL — ABNORMAL LOW (ref 490–1740)
Absolute Lymphocytes: 1072 {cells}/uL (ref 850–3900)
CD4 % (Helper Cells): 39 % (ref 30–61)

## 2023-12-22 NOTE — Progress Notes (Signed)
 [facility] Counselor/Therapist Progress Note  Patient ID: Christine Rice, MRN: [id number]   Date: 12/22/23  Time Spent: 10:02 am - 10:53  am :  Treatment Type: Individual Therapy.  Reported Symptoms: depression and anxiety.   Mental Status Exam: Appearance:  Well Groomed     Behavior: Appropriate  Motor: Normal  Speech/Language:  Normal Rate  Affect: Congruent  Mood: dysthymic  Thought process: normal  Thought content:   WNL  Sensory/Perceptual disturbances:   WNL  Orientation: oriented to person, place, time/date, and situation  Attention: Good  Concentration: Good  Memory: WNL  Fund of knowledge:  Good  Insight:   Good  Judgment:  Good  Impulse Control: Good   Risk Assessment: Danger to Self:  No Self-injurious Behavior: No Danger to Others: No Duty to Warn:no Physical Aggression / Violence:No  Access to Firearms a concern: No  Gang Involvement:No   In case of a mental health emergency:  36 - confidential suicide hotline. [facility]        [address], [place] [address]       [phone] 3.   911  4.   Visiting Nearest ED.    Subjective:   Christine Rice participated from home, via video, is aware of the limitations of tele-sessions, and consented to treatment. Therapist participated from home office. Christine Rice reviewed the events of the past week. Christine Rice noted recently reprimanded at work and now being on probation. She noted anxiety about this and noted this being financially scary. She noted attending family therapy with her mother and noted working on challenging the idea that her brother is their mom's favorite. She noted her continued strain with her brother and noted identifying the motivation for his behavior. We worked on identifying the evidence for this and therapist highlighted this being a jump to conclusion. Therapist worked on challenging this during the session. She  noted a need for balance and noted having numerous changes in the future. She noted taking and discussed a history of being too invested in relationships and this affecting other areas of her life. We worked on exploring this and will continue to going forward. She noted engaging in yoga 3x per week with one session being with her friend. Therapist praised Christine Rice for her effort to improve her health and mood.  She noted interest in learning more about EMDR treatment.  Therapist provided resources via email to review and encouraged additional research.  She discussed difficulties starting maintaining completing tasks and discussed that this affected her since childhood and across multiple settings.  She has a previous diagnosis of ADHD and was treated in childhood with a stimulant.  Therapist provided psychoeducation regarding ADHD and encouraged Christine Rice to discuss her concerns with her medical provider.  Therapist will provide a psychiatric resource, upon request.  Additionally Christine Rice discussed that her son daughter also experiences symptoms and that her daughter has requested to be tested.  Therapist offered resources for this as well upon request.  Therapist validated and normalized Christine Rice's feelings and experience and provided supportive therapy to follow up scheduled for continued treatment which discussed benefits from.  Interventions: interpersonal & CBT  Diagnosis:   Generalized anxiety disorder  Psychiatric Treatment: Yes , via PCP. See chart.    Treatment Plan:  Client Abilities/Strengths Christine Rice is forthcoming, self-aware, and motivated for change.   Support System: Boyfriend, friends.   Client Treatment Preferences Outpatient Therapy.   Client Statement of Needs Christine Rice  would like to process parenting stressors, reduce negative self-talk,  self-care, managing mood proactively, increasing frustration tolerance, managing frustration, and engaging socially.   Treatment  Level Weekly  Symptoms  Anxiety: anxious, irritability, difficulty managing worry, low frustration tolerance, difficulty leaving the home, social anxiety.    (Status: maintained) Depression: poor sleep, lethargy.    (Status: maintained)  Goals:   Christine Rice experiences symptoms of depression and anxiety.    Target Date: 01/20/24 Frequency: Weekly  Progress: 15% Modality: individual    Therapist will provide referrals for additional resources as appropriate.  Therapist will provide psycho-education regarding Christine Rice's diagnosis and corresponding treatment approaches and interventions. Licensed Clinical Social Worker, [place], LCSW will support the patient's ability to achieve the goals identified. will employ CBT, BA, Problem-solving, Solution Focused, Mindfulness,  coping skills, & other evidenced-based practices will be used to promote progress towards healthy functioning to help manage decrease symptoms associated with her diagnosis.   Reduce overall level, frequency, and intensity of the feelings of depression and anxiety as evidenced by decreased  from 6 to 7 days/week to 0 to 1 days/week per client report for at least 3 consecutive months. Verbally express understanding of the relationship between feelings of depression, anxiety and their impact on thinking patterns and behaviors. Verbalize an understanding of the role that distorted thinking plays in creating fears, excessive worry, and ruminations.    [name] participated in the creation of the treatment plan)    Christine Mullet, LCSW

## 2023-12-26 ENCOUNTER — Ambulatory Visit: Payer: Medicaid Other | Admitting: Family

## 2023-12-26 ENCOUNTER — Other Ambulatory Visit: Payer: Self-pay

## 2023-12-26 NOTE — Progress Notes (Deleted)
 [facility]    NURSE PRACTITIONER: Holley Peers, MSN, FNP-BC    Date / Time: Tuesday December 26, 2023      Referring provider: Peers Sharlet Jansky     Please send a copy of the consult to the referring physician      Reason for visit: Hep C eval//treat- week 4 visit    Patient Active Problem List   Diagnosis    Human immunodeficiency virus (HIV) disease (CMS-HCC)    History of methamphetamine abuse (CMS-HCC)    LGSIL of cervix of undetermined significance    Methamphetamine abuse (CMS-HCC)    Alcohol use disorder, moderate, dependence (CMS-HCC)     History of present illness:   Jacqueline Fernandez is a 43 year old female with HIV, controlled on ART, and recent dx of chronic HCV, 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 Now on treatment with Epclusa  x 12wks, on week 3; presents for follow up.    Pt with  HIV + on Bictarvy, managed by [facility]. No prior HCV treatment. Hx of IV /IN drug use, likely how infected. Denies current use. Reports heavy ETOH use, drinks most days, 5-12 drinks: up to a bottle; denies with drawl symptoms/tremor or withdrawal seizure. She has been in rehab programs in the past, last 2016 was abstinent from ETOH x 3 yrs with a brief relapse, then abstinent x 3 more years till relapsed again.     No h/o GIB/EGD, ascites or paracentesis, HE or jaundice episodes.        Hepatitis C History:  Genotype:2, Baseline VL 258,000  IU/mL  Prior HCV Treatment: none  Prior treatment response: n/a  Baseline polymorphisms:n/a  Fibrosis: pending    HCV Treatment: Epclusa  x 12 weeks  Start of Treatment (SOT):  09/06/2023  Anticipated End of Treatment (EOT): 10/31/2023  Anticipated SVR12: 01/01/2024  Potential DDIs: none    Interval History  LOV 07/07/23 w/ this provider. Presents with case manager  On tx w/Epclusa  approx week 3 ; tolerating well, no missed doses or c/f side effects.  Pt reports that she moved recently, pharmacy does not have new address  Reports took her last  Bictarvy today.   Reports drinking alcohol (unclear quantity/frequency)    S/p fibroscan- no sig scaring    Past medical history:  Past Medical History:   Diagnosis Date    HIV disease (CMS-HCC)      Psychiatric Illness: none    Past surgical history:  No past surgical history on file.    Current Medications:  cabotegravir  & rilpivirine  ER (CABENUVA ) 600 & 900 MG/3ML injection, 3 mL (600 mg of cabotegravir ) and 3 mL (900 mg of rilpivirine ) by INTRAMUSCULAR route every 2 months  naloxone  (NARCAN ) 4 mg/0.1 mL nasal spray, FOR SUSPECTED OVERDOSE, call 911! Tilt head, spray one spray into one nostril as needed for respiratory depression. If patient does not respond or responds and then relapses, repeat using a new nasal spray TO THE OTHER NOSTRIL every 3 minutes until emergency medical assistance arrives.  sofosbuvir -velpatasvir  (EPCLUSA ) 400-100 MG tablet, Take 1 tablet by mouth daily.  valACYclovir  (VALTREX ) 1000 mg tablet, Take 1 tablet (1,000 mg) by mouth daily.        Allergy:  No Known Allergies    Family history: No family history of liver disease or cancer  Family History   Problem Relation Name Age of Onset    No Known Problems Mother      No Known Problems Father  Social history:  Socioeconomic History    Marital status: Single   Tobacco Use    Smoking status: Every Day     Current packs/day: 1.00     Types: Cigarettes    Smokeless tobacco: Never   Substance and Sexual Activity    Alcohol use: No     Alcohol/week: 0.0 standard drinks of alcohol    Sexual activity: Yes     Partners: Male     Birth control/protection: Surgical     Current Living Situation:  Employment:      Substance Abuse History:  H/o IV/IN drug use; h/o meth use; unclear last use    Alcohol Use History:  Drinks 5-12 drinks daily most days; up to a bottle/d    Dietary supplement use: no      Review of Systems:  Weight loss:no  Trouble breathing: no  Abdominal pain: no  Bowel movement frequency: daily  Nausea / vomitting: no  Thyroid   problems: no  Skin rash: no  Ascites:no  Confusion: no  Other:   Rest of the review of system was negative    No ascites, edema, no hematemesis, hematochezia, melena, no jaundice, pruritis or acholic stools, no hepatic encephalopathy (no overt encephalopathy, no reversal of sleep/wake cycles, no memory/attention issues).     Physical examination:   There were no vitals taken for this visit.   Wt Readings from Last 2 Encounters:   10/06/23 74.8 kg (165 lb)   09/25/23 70.8 kg (156 lb)      Blood Pressure   10/06/23 116/70   09/25/23 133/81      Pain Score:    General:  Alert and oriented x 3  Affect:  Normal  HEENT:  No scleral icterus, no conjunctival pallor, mucus membranes moist, neck supple without lymphadenopathy, no thryroid abnormalities appreciated  Lungs:  Clear to auscultation bilaterally  Cardiovascular:  No carotid bruit, regular rate and rhythm without murmurs, rubs or gallops.  Abdomen:   liver nonpalable, spleen nonpalpable, no ascites, no tenderness and no guarding  Extremities:  No peripheral edema  Skin:  Non-icteric, no palmar erythema, no spider angiomas, and no visible rash  Neuro:  No asterixis    Studies:  Labs: Following labs reviewed  Lab Results   Component Value Date    AST 17 11/07/2023    ALT 16 11/07/2023    ALK 52 11/07/2023    TP 6.9 11/07/2023    ALB 4.3 11/07/2023    TBILI 0.7 11/07/2023    DBILI 0.1 11/07/2023      Lab Results   Component Value Date    WBC 6.0 10/05/2017    RBC 4.72 10/05/2017    HGB 14.0 10/05/2017    HCT 41.4 10/05/2017    MCV 87.7 10/05/2017    MCHC 33.8 10/05/2017    RDW 12.5 10/05/2017    PLT 183 10/05/2017    MPV 11.4 10/05/2017      Lab Results   Component Value Date    NA 140 07/07/2023    K 3.7 07/07/2023    CL 107 07/07/2023    BICARB 24 07/07/2023    BUN 13 07/07/2023    CREAT 1.04 (H) 07/07/2023    GLU 73 07/07/2023    CA 9.3 07/07/2023      Lab Results   Component Value Date    BUN 13 07/07/2023    CREAT 1.04 (H) 07/07/2023    CL 107 07/07/2023    NA 140  07/07/2023  K 3.7 07/07/2023    CA 9.3 07/07/2023    TBILI 0.7 11/07/2023    ALB 4.3 11/07/2023    TP 6.9 11/07/2023    AST 17 11/07/2023    ALK 52 11/07/2023    BICARB 24 07/07/2023    ALT 16 11/07/2023    GLU 73 07/07/2023        Viral serologies/labs:  Lab Results   Component Value Date    HEPATITISCAB Nonreactive 12/16/2014      No results found for: HEPBSURFACAB  No results found for: HEPCRNA  No results found for: HEPBDNAQT     Outside labs (in Media)  AST 61  ALT 133  Tbili 0.8  Cr 0.79    Imaging:  none    Procedures:    Fibroscan 08/02/23    Probe: XL     Median Stiffness: 6.7  Interquartile stiffness range/med: 16%  Number of Valid Measurements: 10  Total Number of Measurements: 12      Fasting: yes                                            Alcohol: yes had a sip of alcohol at 6am 4hrs before test     Interpretation: This scan meets quality criteria and is consistent with no significant fibrosis.       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 Now on treatment with Epclusa  x 12wks, on week 3; presents for follow up.    Plan:I have examined the patient and reviewed all available pertinent labs, imaging, procedures and documents in EPIC and media.     Chronic HCV  GT2, Previously treatment naive, no significant fibrosis on fibroscan (08/02/23) w/6.7 kPa    HCV Treatment: Epclusa  x 12 weeks  Start of Treatment (SOT):  09/06/2023  Anticipated End of Treatment (EOT): 10/31/2023  Anticipated SVR12: 01/01/2024  Potential DDIs: none    Fibroscan scale    HIV/HCV monoinfection  >7 kPa = F2  >9.5 kPa = F3  >11.9 kPa = F4    - I discussed the natural history of Hepatitis C infection including risk of progression to cirrhosis.   - recommend complete alcohol abstinence    -reviewed DAA therapy including options, duration, side effects, efficacy and overall process to initiate  therapy  -medications reviewed for pot. interaction with DAA: concerns Bictarvy  - HBV ag neg   - will check Hepatitis A and B serologies; he will need vaccination against Hepatitis A and B if he is not immune.   - return to clinic at week 4 of DAA tx  - pt has a life expectancy of greater than 12 months referring to non-liver related conditions and is a good candidate for treatment  - pt is motivated to start treatment and has been counseled on the importance of adherence to HCV treatment   - pt understands that non-adherence to treatment may result in virologic relapse or resistance to HCV medications     Hepatitis C counseling: Patient counseled on transmission reduction by not sharing personal items (e.g., razor, toothbrush, needle/syringe) that might have blood on them.  The prevalence of HCV infection is higher in persons who have multiple sexual partners. However, the risk of transmission between monogamous partners is  low (1% - 5%). Experts generally do not recommend the use of barrier protection for monogamous couples. Others may wish to use barrier precautions to lower the limited risk of HCV transmission. Sexual partners may benefit from counseling and testing.     AUD  -discussed risk of progression of liver dz w/ETOH (+HCV)  -enc quit if possible (cut back if can't quit), enc recovery program  -discussed MAT: not interested at this time: consider for future    HIV  -controlled on ART, followed by [facility]  -took last Winston today: msg pharmacy will assist with getting Rfs; advised pt to get today, discussed importance of avoiding missed doses      HCM  -immunizations: up to date/recommend routine immunizations for age from PCP  -avoid alcohol    Follow up:  Labs today  Complete Epclusa  x 12 wks  Msg to pharmacy to advise of address change: pt to call w/new address  SRV 12 labs on/after Jan 01, 2024    RTC after SVR 12 labs     I personally spent total 35 minutes in face-to-face and non-face-to-face  activities related to the patient's visit today, excluding any separately reportable services/procedures       Sharlet Earnie Peers, NP  Hepatology Nurse Practitioner

## 2023-12-29 LAB — HIV-1 RESISTANCE, PROVIRAL DNA (RTI, PI, INT INHIB) - QUEST
HIV-1 Integrase Proviral DNA: DETECTED — AB
HIV-1 PR, RT Proviral DNA: DETECTED — AB

## 2023-12-29 LAB — HIV-1 RNA QUANT/PLASMA
Hiv 1 Rna, Qn Pcr: <1.3 {Log} — AB
Hiv 1 Rna, Qn Pcr: <20 {copies}/mL — AB

## 2024-01-02 ENCOUNTER — Encounter (HOSPITAL_BASED_OUTPATIENT_CLINIC_OR_DEPARTMENT_OTHER): Payer: Self-pay

## 2024-01-02 DIAGNOSIS — B20 Human immunodeficiency virus [HIV] disease: Secondary | ICD-10-CM

## 2024-01-02 NOTE — Progress Notes (Signed)
[facility] ARV Therapy progress note    S: Jacqueline Fernandez is a 43 year old female living with HIV called today for lab follow up. Pt reports that she self started taking Biktarvy after out last call d/t concern with VL of 145.   Discussed with her that the VL of 145 is a blip, and that the repeat was <20 and that we would not recommend taking both Biktarvy and Cabenuva. Let pt know that it is her preference whether she wants to switch back to oral therapy vs continuing Cabenuva, but that we would not continue both. She will continue with LAI therapy.     O:               Latest Ref Rng & Units 12/21/2023     2:27 PM 12/21/2023     2:26 PM 12/19/2023     4:09 PM 07/07/2023     2:56 PM 10/05/2017     1:20 PM 09/29/2016    12:37 PM 01/19/2016     1:46 PM   HIV Serology   CD4 Count 250 - 1,200 cells/uL -- -- -- -- 818  621  515    CD4 Count - [facility] 490 - 1,740 cells/uL -- 420  -- -- -- -- --   CD4 % 29 - 61 % -- -- -- -- 44  41  41    CD4 % - [facility] 30 - 61 % -- 39  -- -- -- -- --   CD8 Count 100 - 800 cells/uL -- -- -- -- 702  529  504    CD8 % 11 - 38 % -- -- -- -- 37  35  40    CD4:CD8 Ratio 0.70 - 4.00 -- -- -- -- 1.16  1.17  1.02    RNA-PCR Ultra See Comment copies/mL -- -- -- -- 62  Not Detected  Not Detected    RNA-PCR - [facility] NOT DETECTED copies/mL <20 DETECTED  -- 145  NOT DETECTED  -- -- --      Lab Results   Component Value Date    CREAT 1.04 (H) 07/07/2023    GFRNON >60 10/05/2017      Lab Results   Component Value Date    HGB 14.0 10/05/2017    HCT 41.4 10/05/2017    MCV 87.7 10/05/2017    PLT 183 10/05/2017    WBC 6.0 10/05/2017    ABSNEUTRO 3.5 10/05/2017      Lab Results   Component Value Date    AST 17 11/07/2023    ALT 16 11/07/2023    ALK 52 11/07/2023    TP 6.9 11/07/2023    ALB 4.3 11/07/2023    TBILI 0.7 11/07/2023    DBILI 0.1 11/07/2023         A:    1. ARV   Current regimen of Cabenuva since 11/08/23.   Adherence: Second injection given on time  Tolerance: Not discussed today .   Response:  VL  of 145 on 12/18/22, repeat VL on 12/21/23 <20.    ARV history: (low confidence)    12/2014-02/2015             Stribild  02/2015 -04/2015            Complera  04/21/2015-09/2017      Odefsey  09/2017- 11/08/2023   Biktarvy        Baseline Genotype completed on 12/16/2014.  Results were NRTI: none, NNRTI: none, PI:  none.   11/28/2019 genosure prime: NRTI: none, NNRTI: I135T, I178L, INI: none, PI: I62V, 12/21/23 archive: pansensitive , other mutations: RT [id number], PR: I13V, I62V, L63P, I93L, IN: S230N    Antiretroviral Therapy assessment: Pt with a viral of 145 early after starting Cabanuva, repeat VL was <20, and archive genotype showed no suspected resistance. Likely a viral blip, pt had reported being sick around that time. Can continue Cabenuva with continued VL monitoring.     2. OI prophylaxis:Not indicated, CD4 420         P:    1. ARVs: Continue Cabenuva Q2 months   2. Lab orders: Lab No orders of the defined types were placed in this encounter.  3. RTC in 1 month for next Cabenuva injections

## 2024-01-04 ENCOUNTER — Other Ambulatory Visit: Payer: Self-pay

## 2024-01-04 ENCOUNTER — Ambulatory Visit: Payer: Medicaid Other | Admitting: Psychology

## 2024-01-04 DIAGNOSIS — F411 Generalized anxiety disorder: Secondary | ICD-10-CM

## 2024-01-04 NOTE — Progress Notes (Signed)
[facility] Counselor/Therapist Progress Note  Patient ID: MEHR SALE, MRN: [id number],    Date: 01/04/2024  Time Spent:  60  minutes     start time: 0800    end time:  0900  Treatment Type: Family Therapy  Reported Symptoms: Pts present for session via Caregility video, granting consent for the session.  Pts understand the limits of virtual sessions and share that they are in their homes with no one else present.  I shared with pts that I am in my office with no one else here.    Mental Status Exam: Appearance:  Casual     Behavior: Appropriate  Motor: Normal  Speech/Language:  Clear and Coherent  Affect: Appropriate  Mood: normal  Thought process: normal  Thought content:   WNL  Sensory/Perceptual disturbances:   WNL  Orientation: oriented to person, place, and time/date  Attention: Good  Concentration: Good  Memory: WNL  Fund of knowledge:  Good  Insight:   Good  Judgment:  Good  Impulse Control: Good   Risk Assessment: Danger to Self:  No Self-injurious Behavior: No Danger to Others: No Duty to Warn:no Physical Aggression / Violence:No  Access to Firearms a concern: No  Gang Involvement:No   Subjective: Pts share that they both have been busy since our last session (12/15/23) and they have not been together since our last.  Tenille's daughters both have birthdays coming up (1/26 and 2/7) and they are going to [facility] for the weekend for their birthdays.  Talked with [name] and Lashayna about the event with [name] that we discussed last session to see if there are any residual points we need to clarify.  [name] shares that she had not "ill intent" with inviting both of her kids to her home and it was not intended to harm Mazell; [name] wants to be able invite people to her home (both kids) to different events.  Seth still feels like [name] is choosing [name] over Veralynn in these family interactions where both are invited.  Maryfrances's feelings about this  seem to be quite rigid.  Glee was able to entertain the idea that [name] does love her, even though Eveleen stated at one point, "no one is listening to me and no one care about me."  When I brought that to her attention, she backed off of it some, but I still need to pursue that more in our next session.  I want to be able to help Zerena get to the point where she can be aware that [name] loves both pt and Ines Bloomer; Issabelle has said that she understands loving two children since Jonnetta has two daughters.  Encouraged pts to continue with their self care activities and we will meet in 2 wks for a follow up session  Interventions: Cognitive Behavioral Therapy  Diagnosis:Generalized anxiety disorder  Plan: Treatment Plan Strengths/Abilities:  Intelligent, Intuitive, Willing to participate in therapy Treatment Preferences:  Outpatient Individual Therapy Statement of Needs:  Patient is to use CBT, mindfulness and coping skills to help manage and/or decrease symptoms associated with their diagnosis. Symptoms:  Depressed/Irritable mood, worry, social withdrawal Problems Addressed:  Depressive thoughts, Sadness, Sleep issues, etc. Long Term Goals:  Pt to reduce overall level, frequency, and intensity of the feelings of depression/anxiety as evidenced by decreased irritability, negative self talk, and helpless feelings from 6 to 7 days/week to 0 to 1 days/week, per client report, for at least 3 consecutive months.  Progress:  Short Term Goals:  Pt to verbally express understanding of the relationship between feelings of depression/anxiety and their impact on thinking patterns and behaviors.  Pt to verbalize an understanding of the role that distorted thinking plays in creating fears, excessive worry, and ruminations.  Progress:  Target Date:  11/29/2024 Frequency:  Bi-weekly Modality:  Cognitive Behavioral Therapy Interventions by Therapist:  Therapist will use CBT, Mindfulness exercises, Coping skills and  Referrals, as needed by client. Client has verbally approved this treatment plan.  Karie Kirks, [facility]

## 2024-01-05 ENCOUNTER — Other Ambulatory Visit: Payer: Self-pay

## 2024-01-05 ENCOUNTER — Ambulatory Visit (INDEPENDENT_AMBULATORY_CARE_PROVIDER_SITE_OTHER): Payer: Medicaid Other | Admitting: Psychology

## 2024-01-05 DIAGNOSIS — F411 Generalized anxiety disorder: Secondary | ICD-10-CM | POA: Diagnosis not present

## 2024-01-05 NOTE — Progress Notes (Signed)
[facility] Counselor/Therapist Progress Note  Patient ID: Christine Rice, MRN: [id number]   Date: 01/05/24  Time Spent: 11:04 am - 11:58  am 54 Minutes  Treatment Type: Individual Therapy.  Reported Symptoms: depression and anxiety.   Mental Status Exam: Appearance:  Well Groomed     Behavior: Appropriate  Motor: Normal  Speech/Language:  Normal Rate  Affect: Congruent  Mood: dysthymic  Thought process: normal  Thought content:   WNL  Sensory/Perceptual disturbances:   WNL  Orientation: oriented to person, place, time/date, and situation  Attention: Good  Concentration: Good  Memory: WNL  Fund of knowledge:  Good  Insight:   Good  Judgment:  Good  Impulse Control: Good   Risk Assessment: Danger to Self:  No Self-injurious Behavior: No Danger to Others: No Duty to Warn:no Physical Aggression / Violence:No  Access to Firearms a concern: No  Gang Involvement:No   In case of a mental health emergency:  27 - confidential suicide hotline. [facility]        [address], [place] [address]       [phone] 3.   911  4.   Visiting Nearest ED.    Subjective:   Christine Rice participated from home, via video, is aware of the limitations of tele-sessions, and consented to treatment. Therapist participated from home office. Caroleen reviewed the events of the past week. [name] noted continuing to attend family counseling it going well but noted the most recent session being stressful. She noted feeling a lack of support in therapy. [name] I want to be important. "I want to be a priority to people who a priority to me".We worked on exploring this during the session and identified evidence for and against this. Therapist helped khaleesi identify evidence for this and challenge the idea that her mother does not care with the evidence that her mother is participating in family counseling. Therapist challenged  [name]'s jumps to conclusion and highlighted emotional reasoning during the session. Therapist encouraged to adopt "wise mind" and identify her feelings as well as pursue the data and evidence. Therapist noted that [name] will often feel on the defensive when her brother is included in any conversation and we explored this during the session. Therapist encouraged [name] to engage in mindfulness during her sessions and challenge negative self-talk and distortions and communicate her feelings. [name] noted often generalizing regarding people's behavior. We explored this as well. [name] was engaged and motivated during the session. Therapist praised [name] and provided supportive therapy. A Follow-up was scheduled for continued treatment.    Interventions: interpersonal & CBT  Diagnosis:   Generalized anxiety disorder  Psychiatric Treatment: Yes , via PCP. See chart.    Treatment Plan:  Client Abilities/Strengths [name] is forthcoming, self-aware, and motivated for change.   Support System: Boyfriend, friends.   Client Treatment Preferences Outpatient Therapy.   Client Statement of Needs Essence would like to process parenting stressors, reduce negative self-talk,  self-care, managing mood proactively, increasing frustration tolerance, managing frustration, and engaging socially.   Treatment Level Weekly  Symptoms  Anxiety: anxious, irritability, difficulty managing worry, low frustration tolerance, difficulty leaving the home, social anxiety.    (Status: maintained) Depression: poor sleep, lethargy.    (Status: maintained)  Goals:   [name] experiences symptoms of depression and anxiety.    Target Date: 01/20/24 Frequency: Weekly  Progress: 15% Modality: individual    Therapist will provide referrals for additional resources as appropriate.  Therapist will provide psycho-education regarding [name]'s diagnosis and corresponding treatment approaches and  interventions. Licensed Clinical Social Worker, [place], LCSW will support the patient's ability to achieve the goals identified. will employ CBT, BA, Problem-solving, Solution Focused, Mindfulness,  coping skills, & other evidenced-based practices will be used to promote progress towards healthy functioning to help manage decrease symptoms associated with her diagnosis.   Reduce overall level, frequency, and intensity of the feelings of depression and anxiety as evidenced by decreased  from 6 to 7 days/week to 0 to 1 days/week per client report for at least 3 consecutive months. Verbally express understanding of the relationship between feelings of depression, anxiety and their impact on thinking patterns and behaviors. Verbalize an understanding of the role that distorted thinking plays in creating fears, excessive worry, and ruminations.    [name] participated in the creation of the treatment plan)    [name], LCSW

## 2024-01-16 ENCOUNTER — Encounter (HOSPITAL_BASED_OUTPATIENT_CLINIC_OR_DEPARTMENT_OTHER): Payer: Self-pay | Admitting: Internal Medicine

## 2024-01-16 DIAGNOSIS — B20 Human immunodeficiency virus [HIV] disease: Secondary | ICD-10-CM

## 2024-01-18 ENCOUNTER — Ambulatory Visit: Payer: Medicaid Other | Admitting: Psychology

## 2024-01-18 DIAGNOSIS — F411 Generalized anxiety disorder: Secondary | ICD-10-CM

## 2024-01-18 NOTE — Progress Notes (Signed)
 [facility] Counselor/Therapist Progress Note  Patient ID: Christine Rice, MRN: [id number],    Date: 01/18/2024  Time Spent:  45  minutes     start time: 0800    end time:  0845  Treatment Type: Family Therapy  Reported Symptoms: Pts present for session via Caregility video, granting consent for the session.  Pts understand the limits of virtual sessions and share that they are in their homes with no one else present.  I shared with pts that I am in my office with no one else here.    Mental Status Exam: Appearance:  Casual     Behavior: Appropriate  Motor: Normal  Speech/Language:  Clear and Coherent  Affect: Appropriate  Mood: normal  Thought process: normal  Thought content:   WNL  Sensory/Perceptual disturbances:   WNL  Orientation: oriented to person, place, and time/date  Attention: Good  Concentration: Good  Memory: WNL  Fund of knowledge:  Good  Insight:   Good  Judgment:  Good  Impulse Control: Good   Risk Assessment: Danger to Self:  No Self-injurious Behavior: No Danger to Others: No Duty to Warn:no Physical Aggression / Violence:No  Access to Firearms a concern: No  Gang Involvement:No   Subjective: Pts share that they both have been busy since our last session (01/04/24) and they have not been together since our last.  Christine Rice and her daughter went skiing for her daughter's birthdays (67 yo-Christine Rice and 43 yo-Christine Rice).  Christine Rice says that she met with Christine Rice since our last session and he helped her sort through her feelings about our last session and encouraged her to give herself the chance to allow her mom to love both of them.  Christine Rice sees her circumstance with Christine Rice as similar to Christine Rice with Christine Rice.  Talked with Christine Rice and Christine Rice about the choosing of favorites with our kids.  Christine Rice brings up a conversation that Christine Rice had with Christine Rice regarding how Christine Rice had helped her with her car.  Christine Rice's report of the conversation to Christine Rice was much different than  Christine Rice's recollection of the conversation.  When talking with the clients about this situation, Christine Rice got very upset and ended her part of the session.  I wrapped up with Christine Rice and then Christine Rice requesting an individual session with her.  I will meet with her individually, if she wishes to do so.  Interventions: Cognitive Behavioral Therapy  Diagnosis:Generalized anxiety disorder  Plan: Treatment Plan Strengths/Abilities:  Intelligent, Intuitive, Willing to participate in therapy Treatment Preferences:  Outpatient Individual Therapy Statement of Needs:  Patient is to use CBT, mindfulness and coping skills to help manage and/or decrease symptoms associated with their diagnosis. Symptoms:  Depressed/Irritable mood, worry, social withdrawal Problems Addressed:  Depressive thoughts, Sadness, Sleep issues, etc. Long Term Goals:  Pt to reduce overall level, frequency, and intensity of the feelings of depression/anxiety as evidenced by decreased irritability, negative self talk, and helpless feelings from 6 to 7 days/week to 0 to 1 days/week, per client report, for at least 3 consecutive months.  Progress:  Short Term Goals:  Pt to verbally express understanding of the relationship between feelings of depression/anxiety and their impact on thinking patterns and behaviors.  Pt to verbalize an understanding of the role that distorted thinking plays in creating fears, excessive worry, and ruminations.  Progress:  Target Date:  11/29/2024 Frequency:  Bi-weekly Modality:  Cognitive Behavioral Therapy Interventions by Therapist:  Therapist will use CBT, Mindfulness exercises, Coping skills and Referrals, as needed  by client. Client has verbally approved this treatment plan.  [name], [name]

## 2024-01-19 ENCOUNTER — Ambulatory Visit: Payer: Medicaid Other | Admitting: Psychology

## 2024-01-19 DIAGNOSIS — F411 Generalized anxiety disorder: Secondary | ICD-10-CM

## 2024-01-19 NOTE — Progress Notes (Signed)
 [facility] Counselor/Therapist Progress Note  Patient ID: Christine Rice, MRN: [id number]   Date: 01/19/24  Time Spent: 10:06 am - 10:56  am: 50 Minutes  Treatment Type: Individual Therapy.  Reported Symptoms: depression and anxiety.   Mental Status Exam: Appearance:  Well Groomed     Behavior: Appropriate  Motor: Normal  Speech/Language:  Normal Rate  Affect: Congruent  Mood: dysthymic  Thought process: normal  Thought content:   WNL  Sensory/Perceptual disturbances:   WNL  Orientation: oriented to person, place, time/date, and situation  Attention: Good  Concentration: Good  Memory: WNL  Fund of knowledge:  Good  Insight:   Good  Judgment:  Good  Impulse Control: Good   Risk Assessment: Danger to Self:  No Self-injurious Behavior: No Danger to Others: No Duty to Warn:no Physical Aggression / Violence:No  Access to Firearms a concern: No  Gang Involvement:No   In case of a mental health emergency:  19 - confidential suicide hotline. [facility]):        [address], [place] [address]       [phone] 3.   911  4.   Visiting Nearest ED.    Subjective:   Christine Rice participated from home, via video, is aware of the limitations of tele-sessions, and consented to treatment. Therapist participated from home office. Christine Rice reviewed the events of the past week. She noted having family therapy the previous day and this going quite poorly. She noted feeling ganged up on and that her point was blown away.  We worked on processing this during the session. She noted feeling distressed by her mother's recall of events. She noted ending her session early after a negative interaction and noted feeling as though her therapist cut her off during the session. She noted feeling unsupported during the session and a lack of empathy from her mom.  We explored this and identified that Christine Rice was attempting to  stop her mother from proceeding in a certain direction conversationally as she could tell where it was going.  She noted often feeling upset and noted feeling experiencing fight or flight. She noted this occurring in other relationships and her need to address this going forward. We discussed ways to manage distress, maintain perspective, and allow others to communicate their perspective despite having a different perspective. Therapist encouraged Christine Rice to be open to hearing peoples experience and working to not assume how a conversation might go or what the person might say. Therapist encouraged Christine Rice to take a notebook with her to the session and write down her feelings and frustrations while allowing others to provider their perspective. We discussed that allowing this would not mean that one would have to agree but would allow better understanding of the situation and aid in identifying a resolution. Therapist praised Christine Rice for her effort during the session and validated her feelings and experience. Christine Rice was engaged and motivated and expressed commitment towards her goals. A follow-up was scheduled for continued treatment.   Interventions: interpersonal & CBT  Diagnosis:   Generalized anxiety disorder  Psychiatric Treatment: Yes , via PCP. See chart.    Treatment Plan:  Client Abilities/Strengths Christine Rice is forthcoming, self-aware, and motivated for change.   Support System: Boyfriend, friends.   Client Treatment Preferences Outpatient Therapy.   Client Statement of Needs Christine Rice would like to process parenting stressors, reduce negative self-talk,  self-care, managing mood proactively, increasing frustration tolerance, managing frustration, and engaging  socially.   Treatment Level Weekly  Symptoms  Anxiety: anxious, irritability, difficulty managing worry, low frustration tolerance, difficulty leaving the home, social anxiety.    (Status: maintained) Depression: poor  sleep, lethargy.    (Status: maintained)  Goals:   Christine Rice experiences symptoms of depression and anxiety.    Target Date: 01/20/24 Frequency: Weekly  Progress: 15% Modality: individual    Therapist will provide referrals for additional resources as appropriate.  Therapist will provide psycho-education regarding Christine Rice's diagnosis and corresponding treatment approaches and interventions. Licensed Clinical Social Worker, [place], LCSW will support the patient's ability to achieve the goals identified. will employ CBT, BA, Problem-solving, Solution Focused, Mindfulness,  coping skills, & other evidenced-based practices will be used to promote progress towards healthy functioning to help manage decrease symptoms associated with her diagnosis.   Reduce overall level, frequency, and intensity of the feelings of depression and anxiety as evidenced by decreased  from 6 to 7 days/week to 0 to 1 days/week per client report for at least 3 consecutive months. Verbally express understanding of the relationship between feelings of depression, anxiety and their impact on thinking patterns and behaviors. Verbalize an understanding of the role that distorted thinking plays in creating fears, excessive worry, and ruminations.    Young participated in the creation of the treatment plan)    Christine Mullet, LCSW

## 2024-01-30 ENCOUNTER — Other Ambulatory Visit: Payer: Self-pay

## 2024-01-31 ENCOUNTER — Other Ambulatory Visit: Payer: Self-pay

## 2024-02-01 ENCOUNTER — Other Ambulatory Visit: Payer: Self-pay

## 2024-02-01 ENCOUNTER — Encounter (INDEPENDENT_AMBULATORY_CARE_PROVIDER_SITE_OTHER): Payer: Self-pay | Admitting: Internal Medicine

## 2024-02-01 DIAGNOSIS — Z0000 Encounter for general adult medical examination without abnormal findings: Secondary | ICD-10-CM

## 2024-02-02 ENCOUNTER — Ambulatory Visit: Payer: Medicaid Other | Admitting: Psychology

## 2024-02-05 ENCOUNTER — Encounter (HOSPITAL_BASED_OUTPATIENT_CLINIC_OR_DEPARTMENT_OTHER): Payer: Self-pay | Admitting: Pharmacist

## 2024-02-05 ENCOUNTER — Ambulatory Visit: Payer: Medicaid Other | Attending: Internal Medicine

## 2024-02-05 ENCOUNTER — Other Ambulatory Visit: Payer: Self-pay | Admitting: Certified Nurse Midwife

## 2024-02-05 ENCOUNTER — Telehealth: Payer: Self-pay

## 2024-02-05 ENCOUNTER — Other Ambulatory Visit: Payer: Self-pay | Admitting: Family Medicine

## 2024-02-05 ENCOUNTER — Encounter: Payer: Self-pay | Admitting: Certified Nurse Midwife

## 2024-02-05 DIAGNOSIS — B20 Human immunodeficiency virus [HIV] disease: Secondary | ICD-10-CM | POA: Insufficient documentation

## 2024-02-05 DIAGNOSIS — Z1231 Encounter for screening mammogram for malignant neoplasm of breast: Secondary | ICD-10-CM

## 2024-02-05 MED ORDER — CABOTEGRAVIR & RILPIVIRINE ER 600 & 900 MG/3ML IM SUER
600.0000 mg | Freq: Once | INTRAMUSCULAR | Status: AC
Start: 2024-02-05 — End: 2024-02-05
  Administered 2024-02-05: 600 mg via INTRAMUSCULAR

## 2024-02-05 NOTE — Telephone Encounter (Signed)
 TRIAGE VOICEMAIL: Patient states she needs to schedule annual mammogram and needs an order.

## 2024-02-05 NOTE — Interdisciplinary (Signed)
 Injection Administration    Pt was identified with 2 patient identifiers  Allergies Verified    Patients Name Jacqueline Fernandez    Patients Date of Birth [birth year]   Allergies No Known Allergies      Administered:   Dose, Medication, and MD order verified before administration.  Information given to pt  Pt advised that best practice is to wait in clinic or in waiting room for at least 15 minutes after injection administration to watch for possible allergic reaction symptoms including itching, rash, difficulty breathing, swelling of face or throat, fast heartbeat, dizziness, weakness, or any other symptoms that cause concern for pt. If pt experiences any of these reactions pt is to notify front desk staff or clinic staff immediately. Pt verbalized good understanding.    Administered:  Administrations This Visit       cabotegravir 600 mg/3 mL & rilpivirine ER 900 mg/3 mL (CABENUVA) IM injection       Admin Date  02/05/2024 Action  Given Dose  600 mg of cabotegravir Route  IntraMUSCULAR Documented By  Ernestine Conrad, LVN                      [name], [place].  February 05, 2024, 2:17 PM.

## 2024-02-16 ENCOUNTER — Ambulatory Visit: Payer: Medicaid Other | Admitting: Psychology

## 2024-02-20 ENCOUNTER — Encounter (HOSPITAL_BASED_OUTPATIENT_CLINIC_OR_DEPARTMENT_OTHER): Payer: Self-pay | Admitting: Internal Medicine

## 2024-02-20 DIAGNOSIS — B20 Human immunodeficiency virus [HIV] disease: Secondary | ICD-10-CM

## 2024-02-22 ENCOUNTER — Ambulatory Visit: Payer: Medicaid Other | Admitting: Psychology

## 2024-02-22 DIAGNOSIS — F411 Generalized anxiety disorder: Secondary | ICD-10-CM | POA: Diagnosis not present

## 2024-02-22 NOTE — Progress Notes (Signed)
 Christine Rice  Patient ID: Christine Rice, MRN: [id number]   Date: 02/22/24  Time Spent: 2:04 pm - 2:55  pm: 51 Minutes  Treatment Type: Individual Therapy.  Reported Symptoms: depression and anxiety.   Mental Status Exam: Appearance:  Well Groomed     Behavior: Appropriate  Motor: Normal  Speech/Language:  Normal Rate  Affect: Congruent  Mood: dysthymic  Thought process: normal  Thought content:   WNL  Sensory/Perceptual disturbances:   WNL  Orientation: oriented to person, place, time/date, and situation  Attention: Good  Concentration: Good  Memory: WNL  Fund of knowledge:  Good  Insight:   Good  Judgment:  Good  Impulse Control: Good   Risk Assessment: Danger to Self:  No Self-injurious Behavior: No Danger to Others: No Duty to Warn:no Physical Aggression / Violence:No  Access to Firearms a concern: No  Gang Involvement:No   In case of a mental health emergency:  7 - confidential suicide hotline. [facility]):        [address], [place] [address]       [phone] 3.   911  4.   Visiting Nearest ED.    Subjective:   Christine Rice participated from home, via video, is aware of the limitations of tele-sessions, and consented to treatment. Therapist participated from home office. Christine Rice reviewed the events of the past week. Christine Rice noted a need to "let go" of all the issues with her mother. She noted not being hopeful that addressing these issues directly will result in empathy or an apology. We explored this during the session. She noted recently discovering her daughter's behavior that required addressing. She noted her daughter had difficulty receiving feedback. We worked on identifying an approach to address concerns while managing her distress and not "ranting". We explored her approach and worked on delineating her concerns, & identifying her own feelings.  We reviewed the use of empathy, assertiveness, and clear and direct communication. Therapist modeled this. Christine Rice noted often feeling distress during similar conversations. We worked on exploring this and ways to manage her distress. Christine Rice was engaged and motivated during the session. She expressed commitment towards our goals. Therapist praised Christine Rice for her effort, validated her experience, and provided supportive therapy. A follow-up was scheduled for continued treatment.   Interventions: interpersonal & CBT  Diagnosis:   Generalized anxiety disorder  Psychiatric Treatment: Yes , via PCP. See chart.    Treatment Plan:  Client Abilities/Strengths Christine Rice is forthcoming, self-aware, and motivated for change.   Support System: Christine Rice, Christine Rice.   Client Treatment Preferences Outpatient Therapy.   Client Statement of Needs Christine Rice would like to process parenting stressors, reduce negative self-talk,  self-care, managing mood proactively, increasing frustration tolerance, managing frustration, and engaging socially.   Treatment Level Weekly  Symptoms  Anxiety: anxious, irritability, difficulty managing worry, low frustration tolerance, difficulty leaving the home, social anxiety.    (Status: maintained) Depression: poor sleep, lethargy.    (Status: maintained)  Goals:   Christine Rice symptoms of depression and anxiety.    Target Date: 02/24/24 Frequency: Weekly  Progress: 15% Modality: individual    Therapist will provide referrals for additional resources as appropriate.  Therapist will provide psycho-education regarding Christine Rice diagnosis and corresponding treatment approaches and interventions. Licensed Clinical Social Worker, [place], LCSW will support the patient's ability to achieve the goals identified. will employ CBT, BA, Problem-solving, Solution Focused, Mindfulness,  coping skills, & other evidenced-based  practices will be used to promote  progress towards healthy functioning to help manage decrease symptoms associated with her diagnosis.   Reduce overall level, frequency, and intensity of the feelings of depression and anxiety as evidenced by decreased  from 6 to 7 days/week to 0 to 1 days/week per client report for at least 3 consecutive months. Verbally express understanding of the relationship between feelings of depression, anxiety and their impact on thinking patterns and behaviors. Verbalize an understanding of the role that distorted thinking plays in creating fears, excessive worry, and ruminations.    [name] participated in the creation of the treatment plan)    [name], LCSW

## 2024-02-26 ENCOUNTER — Other Ambulatory Visit: Payer: Self-pay

## 2024-02-28 DIAGNOSIS — N050 Unspecified nephritic syndrome with minor glomerular abnormality: Secondary | ICD-10-CM | POA: Diagnosis not present

## 2024-02-28 DIAGNOSIS — R809 Proteinuria, unspecified: Secondary | ICD-10-CM | POA: Diagnosis not present

## 2024-03-01 ENCOUNTER — Telehealth (HOSPITAL_BASED_OUTPATIENT_CLINIC_OR_DEPARTMENT_OTHER): Payer: Self-pay | Admitting: Pharmacist

## 2024-03-01 DIAGNOSIS — B1920 Unspecified viral hepatitis C without hepatic coma: Secondary | ICD-10-CM

## 2024-03-01 NOTE — Addendum Note (Signed)
 Addended by: Leroy Ranks on: 03/01/2024 04:23 PM     Modules accepted: Orders

## 2024-03-01 NOTE — Telephone Encounter (Signed)
 Returning patient's call from previous attempt. Confirmed patient would like to have labs sent to [facility]. Informed patient that she is due for [facility], patient verbalized understanding.     Alfredo Barban, PharmD Candidate 2025  [place] [employer] [facility] [employer]

## 2024-03-01 NOTE — Telephone Encounter (Signed)
 Called patient to inquire where she would like her labs sent to and remind her she is due for SVR12. No answer, left voicemail requesting callback.     Gorden Latino, PharmD Candidate 2025  [place] [employer] [facility] [employer]

## 2024-03-04 ENCOUNTER — Ambulatory Visit (INDEPENDENT_AMBULATORY_CARE_PROVIDER_SITE_OTHER): Payer: Medicaid Other | Admitting: Psychology

## 2024-03-04 DIAGNOSIS — F411 Generalized anxiety disorder: Secondary | ICD-10-CM | POA: Diagnosis not present

## 2024-03-04 NOTE — Progress Notes (Signed)
 [facility] Counselor/Therapist Progress Note  Patient ID: SHRONDA BOEH, MRN: [id number]   Date: 03/04/24  Time Spent: 10:04 pm - 10:57 pm: 53 Minutes  Treatment Type: Individual Therapy.  Reported Symptoms: depression and anxiety.   Mental Status Exam: Appearance:  Well Groomed     Behavior: Appropriate  Motor: Normal  Speech/Language:  Normal Rate  Affect: Congruent  Mood: dysthymic  Thought process: normal  Thought content:   WNL  Sensory/Perceptual disturbances:   WNL  Orientation: oriented to person, place, time/date, and situation  Attention: Good  Concentration: Good  Memory: WNL  Fund of knowledge:  Good  Insight:   Good  Judgment:  Good  Impulse Control: Good   Risk Assessment: Danger to Self:  No Self-injurious Behavior: No Danger to Others: No Duty to Warn:no Physical Aggression / Violence:No  Access to Firearms a concern: No  Gang Involvement:No   In case of a mental health emergency:  27 - confidential suicide hotline. [facility]):        [address], [place] [address]       [phone] 3.   911  4.   Visiting Nearest ED.    Subjective:   Christine Rice participated from home, via video, is aware of the limitations of tele-sessions, and consented to treatment. Therapist participated from office. Christine Rice reviewed the events of the past week. She noted some improvement in her interactions with her mother during the past two weeks. We worked on processing this during the session. She noted her relationship with her new partner, Christine Rice, going well overall. She noted anxiety that this relationship would change or her meets might not be met. She noted anxiety and "fear" and noted it being a "knee jerk" reaction. She noted being an over-thinker and this affects her across relationships. Therapist highlighted Christine Rice's jumps to conclusions and emotional reasoning. She noted often being a  catastrophizer and paying attention to minor changes to people's behavior. We worked on exploring this and therapist reviewed challenging negative thoughts and feelings. We discussed the importance of being mindful of this and challenging this proactively as well as exploring our own expectations for relationships, interactions, and needs. We will work on processing her expectations going forward. Christine Rice noted continuing to ruminate regarding her brother's decision-making and the reasons behind her exclusion. We explored this and therapist identifying jumps to conclusion and emotional reasoning. We worked on challenging this during the session. Therapist validated Christine Rice's experience, encouraged mindfulness, and discussed the importance of challenging distortions. A follow-up was scheduled for continued treatment which she benefits from.   Interventions: interpersonal & CBT  Diagnosis:   Generalized anxiety disorder  Psychiatric Treatment: Yes , via PCP. See chart.    Treatment Plan:  Client Abilities/Strengths Christine Rice is forthcoming, self-aware, and motivated for change.   Support System: Boyfriend, friends.   Client Treatment Preferences Outpatient Therapy.   Client Statement of Needs Christine Rice would like to process parenting stressors, reduce negative self-talk,  self-care, managing mood proactively, increasing frustration tolerance, managing frustration, and engaging socially.   Treatment Level Weekly  Symptoms  Anxiety: anxious, irritability, difficulty managing worry, low frustration tolerance, difficulty leaving the home, social anxiety.    (Status: maintained) Depression: poor sleep, lethargy.    (Status: maintained)  Goals:   Christine Rice experiences symptoms of depression and anxiety.    Target Date: 03/05/24 Frequency: Weekly  Progress: 15% Modality: individual    Therapist will provide referrals for additional  resources as appropriate.  Therapist will provide  psycho-education regarding Koby's diagnosis and corresponding treatment approaches and interventions. Licensed Clinical Social Worker, [place], Christine Rice will support the patient's ability to achieve the goals identified. will employ CBT, BA, Problem-solving, Solution Focused, Mindfulness,  coping skills, & other evidenced-based practices will be used to promote progress towards healthy functioning to help manage decrease symptoms associated with her diagnosis.   Reduce overall level, frequency, and intensity of the feelings of depression and anxiety as evidenced by decreased  from 6 to 7 days/week to 0 to 1 days/week per client report for at least 3 consecutive months. Verbally express understanding of the relationship between feelings of depression, anxiety and their impact on thinking patterns and behaviors. Verbalize an understanding of the role that distorted thinking plays in creating fears, excessive worry, and ruminations.    Christine Rice participated in the creation of the treatment plan)    [name], Christine Rice

## 2024-03-06 DIAGNOSIS — R809 Proteinuria, unspecified: Secondary | ICD-10-CM | POA: Diagnosis not present

## 2024-03-06 DIAGNOSIS — N050 Unspecified nephritic syndrome with minor glomerular abnormality: Secondary | ICD-10-CM | POA: Diagnosis not present

## 2024-03-14 ENCOUNTER — Ambulatory Visit: Payer: Medicaid Other | Attending: Internal Medicine | Admitting: Internal Medicine

## 2024-03-14 VITALS — BP 127/72 | HR 92 | Temp 96.9°F | Resp 16 | Ht 62.0 in | Wt 157.2 lb

## 2024-03-14 DIAGNOSIS — Z111 Encounter for screening for respiratory tuberculosis: Secondary | ICD-10-CM | POA: Insufficient documentation

## 2024-03-14 DIAGNOSIS — Z13220 Encounter for screening for lipoid disorders: Secondary | ICD-10-CM | POA: Insufficient documentation

## 2024-03-14 DIAGNOSIS — Z13228 Encounter for screening for other metabolic disorders: Secondary | ICD-10-CM | POA: Insufficient documentation

## 2024-03-14 DIAGNOSIS — Z130 Encounter for screening for diseases of the blood and blood-forming organs and certain disorders involving the immune mechanism: Secondary | ICD-10-CM | POA: Insufficient documentation

## 2024-03-14 DIAGNOSIS — Z113 Encounter for screening for infections with a predominantly sexual mode of transmission: Secondary | ICD-10-CM | POA: Insufficient documentation

## 2024-03-14 DIAGNOSIS — B20 Human immunodeficiency virus [HIV] disease: Secondary | ICD-10-CM | POA: Insufficient documentation
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
## 2024-03-14 NOTE — Interdisciplinary (Unsigned)
 2 Patient Identifiers verified    Patients Name Jacqueline Fernandez    Age 43 year old   Patient Date of Birth 1981/04/13   Specimen Collected By Patient (Self Swab)   Specimen Collected on 03/14/24       Patient seeing in the clinic for Self Swab collection. Before collecting samples, MD orders were verified.   Detailed instructions and proper Swab and or Container provided for the patient, after collection transport tubes was inspected to ensure cap is secure and liquid transport media is contained, with the proper affixed label that matches patient identifiers and site by Ernestine Conrad, LVN on March 14, 2024

## 2024-03-14 NOTE — Patient Instructions (Signed)
 To get in touch with Dr. Teodoro Kil  Call [phone]  Or send me a message in MyChart. A nurse will read it first, just so you know
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
## 2024-03-16 LAB — CHLAMYDIA/NEISSERIA GONORRHOEAE RNA, TMA, THROAT
C. trachomatis RNA, Throat: NOT DETECTED
N. gonorrhaeae RNA, Throat: NOT DETECTED

## 2024-03-16 LAB — CHLAMYDIA/NEISSERIA GONORRHOEAE RNA, TMA, RECTAL
C. trachomatis RNA, Rectal: NOT DETECTED
N. gonorrhaeae RNA, Rectal: NOT DETECTED

## 2024-03-18 ENCOUNTER — Other Ambulatory Visit: Payer: Self-pay

## 2024-03-18 ENCOUNTER — Ambulatory Visit (INDEPENDENT_AMBULATORY_CARE_PROVIDER_SITE_OTHER): Admitting: Psychology

## 2024-03-18 DIAGNOSIS — F411 Generalized anxiety disorder: Secondary | ICD-10-CM
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
## 2024-03-26 ENCOUNTER — Other Ambulatory Visit: Payer: Self-pay | Admitting: Family Medicine

## 2024-03-26 NOTE — Telephone Encounter (Signed)
 LOV: 11/03/23 anxiety and depression   Last refill: 07/14/23 #30 w/ 2 refills   NOV: Nothing scheduled

## 2024-03-28 ENCOUNTER — Other Ambulatory Visit: Payer: Self-pay

## 2024-04-01 ENCOUNTER — Telehealth (HOSPITAL_BASED_OUTPATIENT_CLINIC_OR_DEPARTMENT_OTHER): Payer: Self-pay

## 2024-04-01 ENCOUNTER — Ambulatory Visit (HOSPITAL_BASED_OUTPATIENT_CLINIC_OR_DEPARTMENT_OTHER): Payer: Medicaid Other | Admitting: Internal Medicine

## 2024-04-01 NOTE — Telephone Encounter (Signed)
 [place]  appt was cancelled. Called patient to reschedule, no answer and mailbox full.

## 2024-04-03 ENCOUNTER — Ambulatory Visit (INDEPENDENT_AMBULATORY_CARE_PROVIDER_SITE_OTHER): Admitting: Psychology

## 2024-04-03 DIAGNOSIS — F411 Generalized anxiety disorder: Secondary | ICD-10-CM | POA: Diagnosis not present

## 2024-04-03 NOTE — Progress Notes (Signed)
 [facility] Counselor/Therapist Progress Note  Patient ID: Christine Rice, MRN: [id number]   Date: 04/03/24  Time Spent: 11:08  am - 12:00 pm : 52 Minutes  Treatment Type: Individual Therapy.  Reported Symptoms: depression and anxiety.   Mental Status Exam: Appearance:  Casual     Behavior: Appropriate  Motor: Normal  Speech/Language:  Clear and Coherent  Affect: Congruent  Mood: dysthymic  Thought process: normal  Thought content:   WNL  Sensory/Perceptual disturbances:   WNL  Orientation: oriented to person, place, time/date, and situation  Attention: Good  Concentration: Good  Memory: WNL  Fund of knowledge:  Good  Insight:   Good  Judgment:  Good  Impulse Control: Good   Risk Assessment: Danger to Self:  No Self-injurious Behavior: No Danger to Others: No Duty to Warn:no Physical Aggression / Violence:No  Access to Firearms a concern: No  Gang Involvement:No   Subjective:   Christine Rice participated from home, via video and consented to treatment. Therapist participated from home office. I discussed the limitations of evaluation and management by telemedicine and the availability of in person appointments. The patient expressed understanding and agreed to proceed. Christine Rice reviewed the events of the past week. We reviewed numerous treatment approaches including CBT, BA, Problem Solving, and Solution focused therapy. Psych-education regarding the Orine's diagnosis of Generalized anxiety disorder was provided during the session. We discussed Christine Rice's goals treatment goals which include distress tolerance, expressing self more adaptively,  manage overall symptoms, process past events, verbalize thoughts and feelings, challenging cognitive distortions (catastrophization), engaging in consistent self-care and relaxation, and managing parental stressors.  Christine Rice provided verbal approval of the treatment plan.   Interventions:  Psycho-education & Goal Setting.   Diagnosis:  Generalized anxiety disorder  Psychiatric Treatment: Yes , via PCP   Treatment Plan:  Client Abilities/Strengths Christine Rice is intelligent, forthcoming, and motivated for change.   Support System: Family and Friends.   Client Treatment Preferences OPT  Client Statement of Needs Christine Rice would like to improve distress tolerance, expressing self more adaptively,  manage overall symptoms, process past events, verbalize thoughts and feelings, challenging cognitive distortions (catastrophization), engaging in consistent self-care and relaxation, identifying boundaries for self and others ( personal relationships), and managing parental stressors.    Treatment Level Biweekly  Symptoms  Depression:  feeling bad about self, lethargy, trouble concentrating, and poor sleep  (middle insomnia. (Status: maintained) Anxiety: Feeling anxious,  trouble relaxing, irritability.   (Status: maintained)  Goals:   Christine Rice experiences symptoms of depression and anxiety.   Treatment plan signed and available on s-drive:  No, pending signature.   Christine Rice was sent the treatment plan signature form on Date: 04/03/24.   Target Date: 04/03/25 Frequency: Weekly  Progress: 0 Modality: individual    Therapist will provide referrals for additional resources as appropriate.  Therapist will provide psycho-education regarding Christine Rice's diagnosis and corresponding treatment approaches and interventions. Christine Boyden, LCSW will support the patient's ability to achieve the goals identified. will employ CBT, BA, Problem-solving, Solution Focused, Mindfulness,  coping skills, & other evidenced-based practices will be used to promote progress towards healthy functioning to help manage decrease symptoms associated with her diagnosis.   Reduce overall level, frequency, and intensity of the feelings of depression, anxiety and panic evidenced by decreased overall  symptoms from 6 to 7 days/week to 0 to 1 days/week per client report for at least 3 consecutive months. Verbally express understanding of the relationship between feelings  of depression, anxiety and their impact on thinking patterns and behaviors. Verbalize an understanding of the role that distorted thinking plays in creating fears, excessive worry, and ruminations.    Christine Rice participated in the creation of the treatment plan)   Christine Boyden, LCSW

## 2024-04-04 ENCOUNTER — Encounter (HOSPITAL_BASED_OUTPATIENT_CLINIC_OR_DEPARTMENT_OTHER): Payer: Self-pay | Admitting: Internal Medicine

## 2024-04-04 ENCOUNTER — Ambulatory Visit (HOSPITAL_BASED_OUTPATIENT_CLINIC_OR_DEPARTMENT_OTHER)

## 2024-04-04 ENCOUNTER — Ambulatory Visit
Admission: RE | Admit: 2024-04-04 | Discharge: 2024-04-04 | Disposition: A | Discharge status: Home / Self Care | Source: Ambulatory Visit | Attending: Certified Nurse Midwife | Admitting: Certified Nurse Midwife

## 2024-04-04 DIAGNOSIS — B20 Human immunodeficiency virus [HIV] disease: Secondary | ICD-10-CM

## 2024-04-04 DIAGNOSIS — Z1231 Encounter for screening mammogram for malignant neoplasm of breast: Secondary | ICD-10-CM

## 2024-04-05 ENCOUNTER — Ambulatory Visit: Attending: Internal Medicine

## 2024-04-05 DIAGNOSIS — B20 Human immunodeficiency virus [HIV] disease: Secondary | ICD-10-CM | POA: Insufficient documentation

## 2024-04-05 MED ORDER — CABOTEGRAVIR & RILPIVIRINE ER 600 & 900 MG/3ML IM SUER
600.0000 mg | Freq: Once | INTRAMUSCULAR | Status: AC
Start: 2024-04-05 — End: 2024-04-05
  Administered 2024-04-05: 600 mg via INTRAMUSCULAR

## 2024-04-05 NOTE — Addendum Note (Signed)
 Addended by: Brycen Bean on: 04/05/2024 03:05 PM     Modules accepted: Orders

## 2024-04-05 NOTE — Interdisciplinary (Signed)
 2 Patient Identifiers verified Jacqueline Fernandez [facility] and [birth year], Allergies, Dose, Vaccine/Medication & MD order verified prior to admin. Pt aware to wait 15 min after injection. Pt was closely monitored for any complications before leaving office. Appropriate discharge documentation provided.    Patient tolerated well, no AR noted.     Vaccine/Medication administered: cab

## 2024-04-05 NOTE — Telephone Encounter (Signed)
 Contacted patient to remind her of HCV RNA at [facility]. She stated she would go today.    Leroy Ranks, PharmD, BCACP

## 2024-04-08 ENCOUNTER — Telehealth (HOSPITAL_BASED_OUTPATIENT_CLINIC_OR_DEPARTMENT_OTHER): Payer: Self-pay

## 2024-04-08 NOTE — Telephone Encounter (Signed)
 PN RW [employer]    Patient was assessed for [employer] eligibility: Yes    Confirmed HIV Dx: Yes     Has patient exhausted other community resources: Yes     Long term plan/Reason for motel voucher: Housing with [facility]     Assessed for Income: Yes     Policy Agreement Signed/Scanned into chart: Yes      Hotel Invoice/Tracking Sheet Logged:  Yes    04/04/24-  I recvd a call from [facility] Johneisha w/pt and pt will be moving into housing  in may.  Hotel Voucher for 2 weeks 4/24-5/8 at [employer] and [employer].          Thank You,    Gwenyth Leo  Patient Navigator lll  [facility]  [address]  [place], [place] [address]  Main Line: [phone]  Email: [email or web address]  Fax: [phone]

## 2024-04-15 ENCOUNTER — Ambulatory Visit (INDEPENDENT_AMBULATORY_CARE_PROVIDER_SITE_OTHER): Admitting: Psychology

## 2024-04-15 DIAGNOSIS — F411 Generalized anxiety disorder: Secondary | ICD-10-CM | POA: Diagnosis not present

## 2024-04-15 NOTE — Progress Notes (Signed)
 [facility] Counselor/Therapist Progress Note  Patient ID: Christine Rice, MRN: [id number]   Date: 04/15/24  Time Spent: 12:34 pm - 1:28 pm : 54 Minutes  Treatment Type: Individual Therapy.  Reported Symptoms: depression and anxiety.   Mental Status Exam: Appearance:  Casual     Behavior: Appropriate  Motor: Normal  Speech/Language:  Clear and Coherent  Affect: Congruent  Mood: dysthymic  Thought process: normal  Thought content:   WNL  Sensory/Perceptual disturbances:   WNL  Orientation: oriented to person, place, time/date, and situation  Attention: Good  Concentration: Good  Memory: WNL  Fund of knowledge:  Good  Insight:   Good  Judgment:  Good  Impulse Control: Good   Risk Assessment: Danger to Self:  No Self-injurious Behavior: No Danger to Others: No Duty to Warn:no Physical Aggression / Violence:No  Access to Firearms a concern: No  Gang Involvement:No   Subjective:   Christine Rice participated from home, via video and consented to treatment. Therapist participated from office. She noted her daughter "getting in trouble again". We explored and processed this during the session. She noted worry and frustration regarding her daughter's behavior. We worked on processing how she dealt with this distress during the session. Therapist praised Christine Rice for managing her frustration and worry regarding this situation. She noted continued stressors in the relationship during the session. She noted a lack of emotional maturity from her partner. We worked on exploring this during the session. She noted having "red flags" but hoping that things will get better. We explored this and we worked on highlighted the pros and cons of the relationship and began starting to identify her needs in a relationship. Therapist highlighted similarities in this relationship and her previous relationship, which led to a break-up. Therapist highlighted Christine Rice's filtering of negative  behaviors and highlighting positives. Therapist encouraged Christine Rice to continue identifying basic needs in a relationship and ways to advocate for self and set boundaries. Christine Rice was engaged and motivated during the session. She expressed commitment towards goals. Therapist praised Christine Rice and provided supportive therapy. A follow-up was scheduled for continued treatment, which she benefits from.   Interventions: CBT and Interpersonal  Diagnosis:  Generalized anxiety disorder  Psychiatric Treatment: Yes , via PCP   Treatment Plan:  Client Abilities/Strengths Christine Rice is intelligent, forthcoming, and motivated for change.   Support System: Family and Friends.   Client Treatment Preferences OPT  Client Statement of Needs Christine Rice would like to improve distress tolerance, expressing self more adaptively,  manage overall symptoms, process past events, verbalize thoughts and feelings, challenging cognitive distortions (catastrophization), engaging in consistent self-care and relaxation, identifying boundaries for self and others ( personal relationships), and managing parental stressors.    Treatment Level Biweekly  Symptoms  Depression:  feeling bad about self, lethargy, trouble concentrating, and poor sleep  (middle insomnia. (Status: maintained) Anxiety: Feeling anxious,  trouble relaxing, irritability.   (Status: maintained)  Goals:   Christine Rice experiences symptoms of depression and anxiety.   Treatment plan signed and available on s-drive:  No, pending signature.   Christine Rice was sent the treatment plan signature form on Date: 04/15/24.   Target Date: 04/03/25 Frequency: Weekly  Progress: 0 Modality: individual    Therapist will provide referrals for additional resources as appropriate.  Therapist will provide psycho-education regarding Christine Rice's diagnosis and corresponding treatment approaches and interventions. Christine Boyden, LCSW will support the patient's ability  to achieve the goals identified. will employ CBT, BA, Problem-solving, Solution Focused,  Mindfulness,  coping skills, & other evidenced-based practices will be used to promote progress towards healthy functioning to help manage decrease symptoms associated with her diagnosis.   Reduce overall level, frequency, and intensity of the feelings of depression, anxiety and panic evidenced by decreased overall symptoms from 6 to 7 days/week to 0 to 1 days/week per client report for at least 3 consecutive months. Verbally express understanding of the relationship between feelings of depression, anxiety and their impact on thinking patterns and behaviors. Verbalize an understanding of the role that distorted thinking plays in creating fears, excessive worry, and ruminations.    [name] participated in the creation of the treatment plan)   Christine Boyden, LCSW

## 2024-04-24 ENCOUNTER — Telehealth (HOSPITAL_BASED_OUTPATIENT_CLINIC_OR_DEPARTMENT_OTHER): Payer: Self-pay

## 2024-04-24 NOTE — Telephone Encounter (Signed)
 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  Pt has been videoed doing this.  This is a direct violation of the Rules and Regulations that pt is explained too when we offer the [employer].      Thank You,    Odella Molt  Patient Navigator lll  [facility]  [address]  [place], [place] [address]  Main Line: [phone]  Email: [email or web address]  Fax: [phone]

## 2024-04-26 NOTE — Telephone Encounter (Signed)
 Spoke to patient by phone to remind her of HCV RNA at [facility]. Requested she complete her labs prior to 05/2024 appt with [facility] clinic as other labs are ordered.    Donzell Flatten, PharmD, BCACP

## 2024-04-29 ENCOUNTER — Ambulatory Visit: Admitting: Psychology

## 2024-04-29 DIAGNOSIS — F411 Generalized anxiety disorder: Secondary | ICD-10-CM

## 2024-04-29 NOTE — Progress Notes (Signed)
 [facility] Counselor/Therapist Progress Note  Patient ID: Christine Rice, MRN: [id number]   Date: 04/29/24  Time Spent: 8:00 am -  8:54 am : 54 Minutes  Treatment Type: Individual Therapy.  Reported Symptoms: depression and anxiety.   Mental Status Exam: Appearance:  Casual     Behavior: Appropriate  Motor: Normal  Speech/Language:  Clear and Coherent  Affect: Tearful  Mood: dysthymic  Thought process: normal  Thought content:   WNL  Sensory/Perceptual disturbances:   WNL  Orientation: oriented to person, place, time/date, and situation  Attention: Good  Concentration: Good  Memory: WNL  Fund of knowledge:  Good  Insight:   Good  Judgment:  Good  Impulse Control: Good   Risk Assessment: Danger to Self:  No Self-injurious Behavior: No Danger to Others: No Duty to Warn:no Physical Aggression / Violence:No  Access to Firearms a concern: No  Gang Involvement:No   Subjective:   ESTEFANI BATESON participated from home, via video and consented to treatment. Therapist participated from office. Amyiah noted her efforts to highlight positives in her daughter's behavior while maintaining current boundaries. She noted worry that her daughter will experience increased depressive symptoms. We worked on processing this and identifying additional supports going forward. She noted her partner's lack of transparency, dishonesty, and lack of communication. We worked on processing this and effect of this on her mood. She noted a lack of communication in the relationship, her partner's avoidance to resolve conflict, and a general lack of empathy. She noted her partner being dishonest. She was tearful during the session and noted being overwhelmed by various life stressors. She noted a need to be more mindful of warning signs in a relationship. Therapist encouraged Cimberly to create a list of needs in a relationship and to put them in order of importance. We will work on processing  this going forward. Therapist validated Laurencia's need for honesty and transparency in her relationship and validated her feelings as well. Therapist highlighted Kauri's negative self-talk and ownership of her partner's behavior and worked on challenging this during the session. Dionna was engaged and motivated during the session and expressed commitment towards goals. Therapist praised Luria and provided supportive therapy. A follow-up was scheduled for continued treatment.   Interventions: CBT and Interpersonal  Diagnosis:  Generalized anxiety disorder  Psychiatric Treatment: Yes , via PCP   Treatment Plan:  Client Abilities/Strengths Luiza is intelligent, forthcoming, and motivated for change.   Support System: Family and Friends.   Client Treatment Preferences OPT  Client Statement of Needs Virgia would like to improve distress tolerance, expressing self more adaptively,  manage overall symptoms, process past events, verbalize thoughts and feelings, challenging cognitive distortions (catastrophization), engaging in consistent self-care and relaxation, identifying boundaries for self and others ( personal relationships), and managing parental stressors.    Treatment Level Biweekly  Symptoms  Depression:  feeling bad about self, lethargy, trouble concentrating, and poor sleep  (middle insomnia. (Status: maintained) Anxiety: Feeling anxious,  trouble relaxing, irritability.   (Status: maintained)  Goals:   Danyell experiences symptoms of depression and anxiety.   Treatment plan signed and available on s-drive:  No, pending signature.   Peighton Mehra Sudbury was sent the treatment plan signature form on Date: 04/29/24.   Target Date: 04/03/25 Frequency: Weekly  Progress: 0 Modality: individual    Therapist will provide referrals for additional resources as appropriate.  Therapist will provide psycho-education regarding Kelsi's diagnosis and corresponding treatment  approaches and interventions. Belva Boyden, LCSW  will support the patient's ability to achieve the goals identified. will employ CBT, BA, Problem-solving, Solution Focused, Mindfulness,  coping skills, & other evidenced-based practices will be used to promote progress towards healthy functioning to help manage decrease symptoms associated with her diagnosis.   Reduce overall level, frequency, and intensity of the feelings of depression, anxiety and panic evidenced by decreased overall symptoms from 6 to 7 days/week to 0 to 1 days/week per client report for at least 3 consecutive months. Verbally express understanding of the relationship between feelings of depression, anxiety and their impact on thinking patterns and behaviors. Verbalize an understanding of the role that distorted thinking plays in creating fears, excessive worry, and ruminations.    [name] participated in the creation of the treatment plan)   Belva Boyden, LCSW

## 2024-05-13 ENCOUNTER — Ambulatory Visit: Payer: Self-pay | Admitting: Psychology

## 2024-05-25 LAB — HCV RNA, QUANTITATIVE REAL TIME PCR - QUEST HISTORICAL
HCV RNA Quant Real Time PCR: <15 [IU]/mL
Hcv Rna, Quantitative Real Time Pcr: <1.18 {Log_IU}/mL

## 2024-05-25 LAB — QUANTIFERON(R)-TB GOLD PLUS, 1 TUBE - QUEST
Mitogen-NIL - Quest: 7.64 [IU]/mL
NIL - Quest: 0.02 [IU]/mL
Quantiferon ® Gold Plus, 1 Tube - Quest: NEGATIVE
TB1-NIL - Quest: 0 [IU]/mL
TB2-NIL - Quest: 0 [IU]/mL

## 2024-05-25 LAB — TRIGLYCERIDES, BLOOD: Triglycerides: 132 mg/dL (ref <150)

## 2024-05-25 LAB — HDL-CHOLESTEROL, BLOOD: HDL Cholesterol: 65 mg/dL (ref >=50)

## 2024-05-25 LAB — CHOLESTEROL, TOTAL BLOOD: Cholesterol: 202 mg/dL — ABNORMAL HIGH (ref <200)

## 2024-05-25 LAB — LDL CHOLESTEROL, DIRECT: LDL-Cholesterol: 112 mg/dL — ABNORMAL HIGH

## 2024-05-25 LAB — NON HDL CHOLESTEROL -QUEST: Non-HDL Cholesterol: 137 mg/dL — ABNORMAL HIGH (ref <130)

## 2024-05-25 LAB — CHOLESTEROL/HDLC RATIO-QUEST: Chol/HDLC Ratio: 3.1 (calc) (ref <5.0)

## 2024-05-27 ENCOUNTER — Other Ambulatory Visit: Payer: Self-pay

## 2024-05-27 ENCOUNTER — Ambulatory Visit: Admitting: Psychology

## 2024-05-28 ENCOUNTER — Other Ambulatory Visit: Payer: Self-pay

## 2024-05-29 ENCOUNTER — Other Ambulatory Visit: Payer: Self-pay

## 2024-05-31 ENCOUNTER — Encounter (HOSPITAL_BASED_OUTPATIENT_CLINIC_OR_DEPARTMENT_OTHER): Payer: Self-pay | Admitting: Internal Medicine

## 2024-06-04 ENCOUNTER — Other Ambulatory Visit: Payer: Self-pay

## 2024-06-05 ENCOUNTER — Ambulatory Visit: Admitting: Internal Medicine

## 2024-06-05 ENCOUNTER — Encounter (HOSPITAL_BASED_OUTPATIENT_CLINIC_OR_DEPARTMENT_OTHER): Payer: Self-pay | Admitting: Internal Medicine

## 2024-06-05 DIAGNOSIS — B20 Human immunodeficiency virus [HIV] disease: Secondary | ICD-10-CM

## 2024-06-05 NOTE — Progress Notes (Deleted)
 Subjective :  Jacqueline Fernandez is a 43 year old female w hx HIV on Cabenuva  who is here for No chief complaint on file.      Last Labs   CD4: 420 on 12/21/23   HIV Quant: not since 2018    #HIV on Cabenuva     #housing    HPI    Issues discussed today:  ***    Review of Systems     . cabotegravir  & rilpivirine  ER 3 mL (600 mg of cabotegravir ) and 3 mL (900 mg of rilpivirine ) by INTRAMUSCULAR route every 2 months 6 mL 6     No Known Allergies    Reviewed patients pertinent information related to social history, past medical, past surgical, and family history.     Objective :  Vital signs: There were no vitals taken for this visit.    Physical Exam     Assessment/Plan:  Assessment & Plan

## 2024-06-06 ENCOUNTER — Encounter (HOSPITAL_BASED_OUTPATIENT_CLINIC_OR_DEPARTMENT_OTHER): Payer: Self-pay

## 2024-06-07 ENCOUNTER — Other Ambulatory Visit: Payer: Self-pay | Admitting: Family Medicine

## 2024-06-07 NOTE — Telephone Encounter (Signed)
 Last filled on 02/05/24 #90 cap/ 3 refill  Last OV was on 11/03/23/ no future appts

## 2024-06-10 ENCOUNTER — Other Ambulatory Visit: Payer: Self-pay | Admitting: Pharmacist

## 2024-06-10 ENCOUNTER — Other Ambulatory Visit: Payer: Self-pay

## 2024-06-10 ENCOUNTER — Ambulatory Visit: Attending: Internal Medicine

## 2024-06-10 ENCOUNTER — Encounter (HOSPITAL_BASED_OUTPATIENT_CLINIC_OR_DEPARTMENT_OTHER): Payer: Self-pay

## 2024-06-10 DIAGNOSIS — B20 Human immunodeficiency virus [HIV] disease: Secondary | ICD-10-CM | POA: Insufficient documentation

## 2024-06-10 MED ORDER — CABOTEGRAVIR & RILPIVIRINE ER 600 & 900 MG/3ML IM SUER
600.0000 mg | Freq: Once | INTRAMUSCULAR | Status: AC
Start: 2024-06-10 — End: 2024-06-10
  Administered 2024-06-10: 600 mg via INTRAMUSCULAR

## 2024-06-10 NOTE — Interdisciplinary (Signed)
 Injection/Immunization/Medication Administration Documentation    Patients Name: Jacqueline Fernandez   Patients Date of Birth: October 06, 1981   Age: 43 year old     For Immunizations only:    The patient was given a Vaccine Information Statement (CDC VIS) with the most up to date publication date Yes/No: No.    2 Patient Identifiers verified.  Before administration, the dose, medication, and physician's order were double-checked.  Patient allergies and any special considerations were thoroughly reviewed and validated before proceeding.    The administration of   Administrations This Visit       cabotegravir  600 mg/3 mL & rilpivirine  ER 900 mg/3 mL (CABENUVA ) IM injection       Admin Date  06/10/2024 Action  Given Dose  600 mg of cabotegravir  Route  IntraMUSCULAR Documented By  Claudean Prentice Ferraris, LVN                 has been completed and recorded in the patient's medical record.    The patient tolerated the procedure well and received appropriate discharge information.    Prentice Ferraris Claudean, LVN  Signature Generated from [employer], June 10, 2024, 10:12 AM.

## 2024-06-10 NOTE — Progress Notes (Signed)
 SW submitted Jacqueline Fernandez referral for PT and re-directed her to [facility] where she is receiving case management services from [place].

## 2024-06-10 NOTE — Progress Notes (Signed)
 Pt completed therapy with Epclusa  X 12 weeks, hep C lab on 05/21/24 is not detected. Pt achieved SVR12, removing pt from [facility].

## 2024-06-12 ENCOUNTER — Other Ambulatory Visit: Payer: Self-pay | Admitting: Family Medicine

## 2024-06-13 NOTE — Telephone Encounter (Signed)
 Last filled on 10/02/23 #90 caps 2 refills   Last OV was a f/u on 11/03/23

## 2024-06-17 ENCOUNTER — Other Ambulatory Visit: Payer: Self-pay | Admitting: Family Medicine

## 2024-06-17 NOTE — Telephone Encounter (Signed)
 Too early

## 2024-06-18 NOTE — Telephone Encounter (Signed)
 Not due until 06/24/24. Declined

## 2024-07-30 ENCOUNTER — Other Ambulatory Visit: Payer: Self-pay

## 2024-08-02 ENCOUNTER — Other Ambulatory Visit: Payer: Self-pay

## 2024-08-07 ENCOUNTER — Ambulatory Visit: Attending: Internal Medicine | Admitting: Internal Medicine

## 2024-08-07 VITALS — BP 118/86 | HR 95 | Temp 98.1°F | Resp 18 | Ht 62.0 in | Wt 148.3 lb

## 2024-08-07 DIAGNOSIS — B20 Human immunodeficiency virus [HIV] disease: Secondary | ICD-10-CM | POA: Insufficient documentation

## 2024-08-07 DIAGNOSIS — Z5900 Homelessness unspecified: Secondary | ICD-10-CM | POA: Insufficient documentation

## 2024-08-07 MED ORDER — CABOTEGRAVIR & RILPIVIRINE ER 600 & 900 MG/3ML IM SUER
600.0000 mg | Freq: Once | INTRAMUSCULAR | Status: AC
Start: 2024-08-07 — End: 2024-08-07
  Administered 2024-08-07: 600 mg via INTRAMUSCULAR
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
## 2024-08-07 NOTE — Interdisciplinary (Signed)
 Injection/Immunization/Medication Administration Documentation    Patients Name: Jacqueline Fernandez   Patients Date of Birth: 05-16-1981   Age: 43 year old     For Immunizations only:    The patient was given a Vaccine Information Statement (CDC VIS) with the most up to date publication date Yes/No: No.    2 Patient Identifiers verified.  Before administration, the dose, medication, and physician's order were double-checked.  Patient allergies and any special considerations were thoroughly reviewed and validated before proceeding.    The administration of   Administrations This Visit       cabotegravir  600 mg/3 mL & rilpivirine  ER 900 mg/3 mL (CABENUVA ) IM injection       Admin Date  08/07/2024 Action  Given Dose  600 mg of cabotegravir  Route  IntraMUSCULAR Documented By  Iola Clotilda Donovan, LVN                 has been completed and recorded in the patient's medical record.    The patient tolerated the procedure well and received appropriate discharge information.    Clotilda Donovan Iola, LVN  Signature Generated from [employer], August 07, 2024, 11:30 AM.

## 2024-08-13 ENCOUNTER — Other Ambulatory Visit: Payer: Self-pay | Admitting: Family Medicine

## 2024-08-15 NOTE — Telephone Encounter (Signed)
 Name of Medication: Xanax  Name of Pharmacy: [facility]  Last Fill or Written Date and Quantity: 03/26/24 #30 tab/ 2 refills  Last Office Visit and Type: 11/03/23 f/u on med Next Office Visit and Type: none scheduled

## 2024-08-18 ENCOUNTER — Encounter: Payer: Self-pay | Admitting: Family Medicine

## 2024-08-19 NOTE — Assessment & Plan Note (Signed)
 Orders:    Follow Up in This Department - [facility]    Pharmacist Communication, Treatment Plan Use Only; Standing    Nursing Communication; Standing    cabotegravir  600 mg/3 mL & rilpivirine  ER 900 mg/3 mL (CABENUVA ) IM injection    Follow Up in This Department - [facility]; Future    Follow Up in This Department - [facility]; Future

## 2024-09-10 ENCOUNTER — Telehealth (HOSPITAL_BASED_OUTPATIENT_CLINIC_OR_DEPARTMENT_OTHER): Payer: Self-pay | Admitting: Internal Medicine

## 2024-09-10 DIAGNOSIS — B20 Human immunodeficiency virus [HIV] disease: Secondary | ICD-10-CM

## 2024-09-10 NOTE — Telephone Encounter (Signed)
 This Clinical research associate called and spoke to patient with 2 identifiers provided. This writer able to schedule an appointment as due for Pap on 09/24/2024 and acknowledged.

## 2024-09-24 ENCOUNTER — Ambulatory Visit: Admitting: Internal Medicine

## 2024-10-01 ENCOUNTER — Other Ambulatory Visit: Payer: Self-pay

## 2024-10-09 ENCOUNTER — Ambulatory Visit: Attending: Internal Medicine | Admitting: Internal Medicine

## 2024-10-09 ENCOUNTER — Encounter (HOSPITAL_BASED_OUTPATIENT_CLINIC_OR_DEPARTMENT_OTHER): Payer: Self-pay

## 2024-10-09 ENCOUNTER — Other Ambulatory Visit: Payer: Self-pay

## 2024-10-09 VITALS — BP 135/80 | HR 74 | Temp 96.7°F | Resp 16 | Ht 62.0 in | Wt 143.0 lb

## 2024-10-09 DIAGNOSIS — B20 Human immunodeficiency virus [HIV] disease: Secondary | ICD-10-CM

## 2024-10-09 DIAGNOSIS — F1190 Opioid use, unspecified, uncomplicated: Secondary | ICD-10-CM | POA: Insufficient documentation

## 2024-10-09 DIAGNOSIS — F1510 Other stimulant abuse, uncomplicated: Secondary | ICD-10-CM | POA: Insufficient documentation

## 2024-10-09 DIAGNOSIS — F1020 Alcohol dependence, uncomplicated: Secondary | ICD-10-CM | POA: Insufficient documentation

## 2024-10-09 MED ORDER — CABOTEGRAVIR & RILPIVIRINE ER 600 & 900 MG/3ML IM SUER
600.0000 mg | Freq: Once | INTRAMUSCULAR | Status: AC
Start: 2024-10-09 — End: 2024-10-09
  Administered 2024-10-09: 600 mg via INTRAMUSCULAR

## 2024-10-09 NOTE — Interdisciplinary (Signed)
 Injection/Immunization/Medication Administration Documentation    Patients Name: Jacqueline Fernandez   Patients Date of Birth: Oct 23, 1981   Age: 43 year old     For Immunizations only:    The patient was given a Vaccine Information Statement (CDC VIS) with the most up to date publication date Yes/No: No.    2 Patient Identifiers verified.  Before administration, the dose, medication, and physician's order were double-checked.  Patient allergies and any special considerations were thoroughly reviewed and validated before proceeding.    The administration of   Administrations This Visit       cabotegravir  600 mg/3 mL & rilpivirine  ER 900 mg/3 mL (CABENUVA ) IM injection       Admin Date  10/09/2024 Action  Given Dose  600 mg of cabotegravir  Route  IntraMUSCULAR Documented By  Iola Clotilda Donovan, LVN                 has been completed and recorded in the patient's medical record.    The patient tolerated the procedure well and received appropriate discharge information.    Clotilda Donovan Iola, LVN  Signature Generated from [employer], October 09, 2024, 11:20 AM.
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
## 2024-10-22 NOTE — Assessment & Plan Note (Addendum)
 HIV is well controlled with excellent immune status on current regimen  Cont CAB  Check labs 2x/year or more prn  Patient counseled/is aware of the following:  The risk of transmission and the concept of U=U  Adherence and side effects of their HIV medications  Insurance coverage and financial resources at [facility] to ensure medical visit and medication coverage  The multidisciplinary clinic approach at [place] and the various services offered here

## 2024-11-21 ENCOUNTER — Other Ambulatory Visit: Payer: Self-pay

## 2024-11-21 ENCOUNTER — Emergency Department
Admission: EM | Admit: 2024-11-21 | Discharge: 2024-11-21 | Payer: Self-pay | Attending: Emergency Medicine | Admitting: Emergency Medicine

## 2024-11-21 ENCOUNTER — Encounter (HOSPITAL_BASED_OUTPATIENT_CLINIC_OR_DEPARTMENT_OTHER): Payer: Self-pay | Admitting: Internal Medicine

## 2024-11-21 ENCOUNTER — Other Ambulatory Visit (HOSPITAL_BASED_OUTPATIENT_CLINIC_OR_DEPARTMENT_OTHER): Payer: Self-pay | Admitting: Internal Medicine

## 2024-11-21 ENCOUNTER — Encounter (HOSPITAL_COMMUNITY): Payer: Self-pay

## 2024-11-21 ENCOUNTER — Emergency Department (HOSPITAL_COMMUNITY): Payer: Self-pay

## 2024-11-21 DIAGNOSIS — B20 Human immunodeficiency virus [HIV] disease: Secondary | ICD-10-CM | POA: Insufficient documentation

## 2024-11-21 DIAGNOSIS — R0789 Other chest pain: Secondary | ICD-10-CM | POA: Insufficient documentation

## 2024-11-21 DIAGNOSIS — R200 Anesthesia of skin: Secondary | ICD-10-CM | POA: Insufficient documentation

## 2024-11-21 DIAGNOSIS — Z653 Problems related to other legal circumstances: Secondary | ICD-10-CM | POA: Insufficient documentation

## 2024-11-21 DIAGNOSIS — M25511 Pain in right shoulder: Secondary | ICD-10-CM | POA: Insufficient documentation

## 2024-11-21 DIAGNOSIS — R079 Chest pain, unspecified: Secondary | ICD-10-CM

## 2024-11-21 DIAGNOSIS — F17210 Nicotine dependence, cigarettes, uncomplicated: Secondary | ICD-10-CM | POA: Insufficient documentation

## 2024-11-21 MED ORDER — CABENUVA 600 & 900 MG/3ML IM SUER
INTRAMUSCULAR | 6 refills | Status: AC
  Filled 2024-11-21: qty 6, 60d supply, fill #0
  Filled 2024-11-28: qty 6, 56d supply, fill #0

## 2024-11-21 NOTE — ED Provider Notes (Signed)
 [facility]  ED PROVIDER NOTE    Date of Service: 11/21/2024  Attending Provider: Morene Debby Limber, DO    Chief Complaint    Chest pain, medical clearance (brought in by sheriff from [facility])    History of Present Illness  43 year old female with PMH of HIV disease presents with right-sided chest pain radiating to her right shoulder, starting earlier today. Described as 6/10 in intensity, radiating to right shoulder, also c/o numbness in both hands. Denies shortness of breath, fevers, chills, cough, congestion. Admits to some sore throat. Denies other acute complaints at this time. Context: Brought in by law enforcement for medical clearance. No modifiers.    Review of Systems  All others reviewed and noncontributory.    PFSH  Medical: HIV disease  Surgical: No past surgical history on file  Family: No known problems (mother, father)  Social: Single, smokes 1 pack/day cigarettes, no alcohol use, sexually active with female partners, surgical birth control    Allergies  No known allergies    Home Medications  CABOTEGRAVIR  & RILPIVIRINE  ER (CABENUVA ) 600 & 900 MG/3ML INJECTION, 3 mL (600 mg of cabotegravir ) and 3 mL (900 mg of rilpivirine ) by IM route every 2 months    Physical Exam  General: No acute distress, laying in bed, non-toxic appearing  HEENT: No scleral icterus, normocephalic, atraumatic  Neck: No swelling, full range of motion, no meningismus  Chest: Clear to auscultation bilaterally, no crackles/wheezing, normal work of breathing, not in respiratory distress  CVS: S1S2, no murmurs appreciated  Abdomen: Soft, non-tender, non-distended  Extremities: No noted lower extremity edema, no obvious deformity  Skin: No rash, no pallor  Neuro: Cranial nerves 2-12 grossly intact, moving all extremities spontaneously, awake, alert and oriented x3  Psych: Normal affect, linear thought process    Vitals and Triage Data  Initial Vitals: Temp 97.77F (36.5C), BP 117/76, Pulse 82, Resp 16, SpO2  100%  Repeat/Trend Vitals: Stable, as above  Oxygen Use: None    Labs and Diagnostics  Labs: Comprehensive metabolic panel, hs troponin I, CBC with diff, SARS-CoV-2/Influenza A/B/RSV PCR reviewed  Per my interpretation, labs are unremarkable and do not suggest acute pathology.  Imaging: X-ray chest single view  Per my interpretation, imaging suggests no active cardiopulmonary disease.  EKG: Normal sinus rhythm at 81 bpm, no changes consistent with acute ischemia, no evidence of ischemia or arrhythmia  Per my interpretation, EKG findings suggest no acute cardiac process.    Clinical Calculators & Risk Scores  HEART Score: Low risk (age, risk factors, normal EKG, normal troponin, atypical pain)    Procedures  None performed.    Assessment & Plan  Problems:  Atypical chest pain  HIV disease (chronic, stable, on ART)  Tobacco use disorder  Problem 1: Atypical chest pain  Clinical evaluation included detailed history, physical exam, EKG, cardiac biomarkers, and chest X-ray. No evidence of acute coronary syndrome, pulmonary embolism, pneumothorax, or other emergent pathology. Pain is atypical, non-exertional, and not associated with concerning features. Patient denied shortness of breath or other systemic symptoms. Labs and imaging unremarkable. Patient refused further intervention. Discharged with strict return precautions and advised to follow up as needed.  Problem 2: HIV disease (chronic, stable, on ART)  Patient is on regular CABENUVA  injections, no evidence of acute HIV-related complications. No opportunistic infections or acute decompensation noted. Continue current ART regimen.  Problem 3: Tobacco use disorder  Patient smokes 1 pack/day. Counseled on smoking cessation and risks, offered resources for cessation  support.    ED Course  Data reviewed (labs, imaging, EKG, old records): Comprehensive metabolic panel, hs troponin I, CBC with diff, SARS-CoV-2/Influenza A/B/RSV PCR, chest X-ray, EKG, nursing  notes  Treatments and interventions: None administered; patient refused further intervention  Consultant discussions (name/specialty): None  Serial re-exams: Patient remained stable throughout ED stay  Changes in clinical status or key turning points: No change, remained stable, no new symptoms  Medications given: None  Response to treatment: Not applicable  Disposition: Discharge discussed with patient    Disposition  Discharge to custody  Patient expressed understanding of results of workup, recommended follow-up, and strict return precautions    E/M Coding and Supporting Documentation  Number and complexity of problems addressed: Multiple problems, including evaluation of chest pain in a patient with HIV and tobacco use  Amount and/or complexity of data to be reviewed and analyzed: Multiple labs, EKG, chest X-ray, review of nursing notes and prior records  Risk of complication and/or morbidity or mortality of patient management: Moderate, given evaluation for potentially life-threatening causes of chest pain in a patient with chronic illness  Procedures: None  Attestation: All components reviewed and verified.       Dempsey Morene Ned, DO  11/21/24 [id number]

## 2024-11-21 NOTE — ED Notes (Signed)
Bed: F  Expected date:   Expected time:   Means of arrival: Police Custody  Comments:

## 2024-11-21 NOTE — Discharge Instructions (Signed)
 Cleared for jail

## 2024-11-21 NOTE — ED Notes (Signed)
 Pt refused labs.

## 2024-11-21 NOTE — ED Notes (Signed)
 Pt to XR

## 2024-11-21 NOTE — ED Notes (Signed)
 Pt refusing to answer questions for registration staff

## 2024-11-21 NOTE — ED Notes (Signed)
 Pt refused vitals, A&Ox4, mentation appropriate for age with steady gait

## 2024-11-21 NOTE — ED Triage Notes (Signed)
 Bib sheriff from [facility] for medical clearance. 6/10 CP radiating to right shoulder and also c/o numbness on both hands. Denies SOB

## 2024-11-22 LAB — ECG 12-LEAD
ATRIAL RATE: 81 {beats}/min
ECG INTERPRETATION: NORMAL
P AXIS: 41 degrees
PR INTERVAL: 132 ms
QRS INTERVAL/DURATION: 84 ms
QT: 394 ms
QTc (Bazett): 457 ms
QTc (Fredericia): 435 ms
R AXIS: 14 degrees
T AXIS: 14 degrees
VENTRICULAR RATE: 81 {beats}/min

## 2024-11-25 ENCOUNTER — Other Ambulatory Visit: Payer: Self-pay

## 2024-11-26 ENCOUNTER — Telehealth (HOSPITAL_BASED_OUTPATIENT_CLINIC_OR_DEPARTMENT_OTHER): Payer: Self-pay

## 2024-11-26 ENCOUNTER — Other Ambulatory Visit: Payer: Self-pay

## 2024-11-26 NOTE — Telephone Encounter (Signed)
 Insurance on file termd. Spoke to pt and she will go to the county to get it reinstated. Let patient know we have a financial team that can help her too if she needs it.

## 2024-11-28 ENCOUNTER — Encounter (HOSPITAL_BASED_OUTPATIENT_CLINIC_OR_DEPARTMENT_OTHER): Payer: Self-pay

## 2024-11-28 ENCOUNTER — Ambulatory Visit: Payer: Self-pay

## 2024-11-28 ENCOUNTER — Ambulatory Visit: Attending: Internal Medicine

## 2024-11-28 ENCOUNTER — Other Ambulatory Visit: Payer: Self-pay

## 2024-11-28 ENCOUNTER — Encounter (HOSPITAL_BASED_OUTPATIENT_CLINIC_OR_DEPARTMENT_OTHER): Payer: Self-pay | Admitting: Internal Medicine

## 2024-11-28 DIAGNOSIS — B20 Human immunodeficiency virus [HIV] disease: Secondary | ICD-10-CM | POA: Insufficient documentation

## 2024-11-28 MED ORDER — CABOTEGRAVIR & RILPIVIRINE ER 600 & 900 MG/3ML IM SUER
600.0000 mg | Freq: Once | INTRAMUSCULAR | Status: AC
  Administered 2024-11-28: 14:00:00 600 mg via INTRAMUSCULAR

## 2024-11-28 NOTE — Interdisciplinary (Signed)
 Injection/Immunization/Medication Administration Documentation    Patients Name: Jacqueline Fernandez   Patients Date of Birth: [birth year]   Age: 43 year old     For Immunizations only:    The patient was given a Vaccine Information Statement (CDC VIS) with the most up to date publication date Yes/No: No.    2 Patient Identifiers verified.  Before administration, the dose, medication, and physician's order were double-checked.  Patient allergies and any special considerations were thoroughly reviewed and validated before proceeding.    The administration of   Administrations This Visit       cabotegravir  600 mg/3 mL & rilpivirine  ER 900 mg/3 mL (CABENUVA ) IM injection       Admin Date  11/28/2024 Action  Given Dose  600 mg of cabotegravir  Route  IntraMUSCULAR Documented By  Fernanda Will Coe, LVN                 has been completed and recorded in the patient's medical record.    The patient tolerated the procedure well and received appropriate discharge information.    Will Coe Fernanda, LVN  Signature Generated from [employer], November 28, 2024, 1:42 PM.

## 2024-11-29 ENCOUNTER — Other Ambulatory Visit: Payer: Self-pay

## 2024-12-13 ENCOUNTER — Other Ambulatory Visit: Payer: Self-pay | Admitting: Family Medicine

## 2024-12-13 NOTE — Telephone Encounter (Signed)
 Last OV was on 11/03/23 (over a year) and no future appts.  Last filled on 06/13/24 #90 caps/ 1 refill

## 2024-12-14 NOTE — Telephone Encounter (Signed)
 Refilled once Please schedule follow up

## 2024-12-25 ENCOUNTER — Other Ambulatory Visit: Payer: Self-pay

## 2025-01-29 ENCOUNTER — Ambulatory Visit
